# Patient Record
Sex: Female | Born: 1945 | Race: White | Hispanic: No | Marital: Married | Smoking: Former smoker
Health system: Southern US, Community
[De-identification: ages and names within clinical notes are randomized; demographics above are authoritative.]

## PROBLEM LIST (undated history)

## (undated) DIAGNOSIS — I2510 Atherosclerotic heart disease of native coronary artery without angina pectoris: Secondary | ICD-10-CM

## (undated) DIAGNOSIS — I82409 Acute embolism and thrombosis of unspecified deep veins of unspecified lower extremity: Secondary | ICD-10-CM

## (undated) DIAGNOSIS — F419 Anxiety disorder, unspecified: Secondary | ICD-10-CM

## (undated) DIAGNOSIS — M5137 Other intervertebral disc degeneration, lumbosacral region: Secondary | ICD-10-CM

## (undated) DIAGNOSIS — J189 Pneumonia, unspecified organism: Secondary | ICD-10-CM

## (undated) DIAGNOSIS — I5042 Chronic combined systolic (congestive) and diastolic (congestive) heart failure: Secondary | ICD-10-CM

## (undated) DIAGNOSIS — I639 Cerebral infarction, unspecified: Secondary | ICD-10-CM

## (undated) DIAGNOSIS — Z8719 Personal history of other diseases of the digestive system: Secondary | ICD-10-CM

## (undated) DIAGNOSIS — M1990 Unspecified osteoarthritis, unspecified site: Secondary | ICD-10-CM

## (undated) DIAGNOSIS — F329 Major depressive disorder, single episode, unspecified: Secondary | ICD-10-CM

## (undated) DIAGNOSIS — J449 Chronic obstructive pulmonary disease, unspecified: Secondary | ICD-10-CM

## (undated) DIAGNOSIS — G8929 Other chronic pain: Secondary | ICD-10-CM

## (undated) DIAGNOSIS — G4730 Sleep apnea, unspecified: Secondary | ICD-10-CM

## (undated) DIAGNOSIS — E785 Hyperlipidemia, unspecified: Secondary | ICD-10-CM

## (undated) DIAGNOSIS — M51379 Other intervertebral disc degeneration, lumbosacral region without mention of lumbar back pain or lower extremity pain: Secondary | ICD-10-CM

## (undated) DIAGNOSIS — F32A Depression, unspecified: Secondary | ICD-10-CM

## (undated) DIAGNOSIS — I739 Peripheral vascular disease, unspecified: Secondary | ICD-10-CM

## (undated) HISTORY — PX: CARPAL TUNNEL RELEASE: SHX101

## (undated) HISTORY — DX: Hyperlipidemia, unspecified: E78.5

## (undated) HISTORY — PX: TUMOR EXCISION: SHX421

## (undated) HISTORY — PX: BACK SURGERY: SHX140

## (undated) HISTORY — PX: BREAST SURGERY: SHX581

## (undated) HISTORY — DX: Cerebral infarction, unspecified: I63.9

## (undated) HISTORY — DX: Atherosclerotic heart disease of native coronary artery without angina pectoris: I25.10

## (undated) HISTORY — PX: TOTAL HIP ARTHROPLASTY: SHX124

## (undated) HISTORY — PX: FRACTURE SURGERY: SHX138

## (undated) HISTORY — PX: HERNIA REPAIR: SHX51

## (undated) HISTORY — PX: FOOT FRACTURE SURGERY: SHX645

## (undated) HISTORY — DX: Sleep apnea, unspecified: G47.30

## (undated) HISTORY — PX: FIXATION KYPHOPLASTY LUMBAR SPINE: SHX1642

## (undated) HISTORY — DX: Chronic obstructive pulmonary disease, unspecified: J44.9

## (undated) HISTORY — PX: CORONARY ANGIOPLASTY WITH STENT PLACEMENT: SHX49

## (undated) HISTORY — DX: Acute embolism and thrombosis of unspecified deep veins of unspecified lower extremity: I82.409

## (undated) HISTORY — PX: CHOLECYSTECTOMY: SHX55

## (undated) HISTORY — PX: APPENDECTOMY: SHX54

## (undated) HISTORY — PX: ABDOMINAL HYSTERECTOMY: SHX81

---

## 2009-12-18 ENCOUNTER — Ambulatory Visit: Payer: Self-pay | Admitting: Cardiology

## 2010-05-13 ENCOUNTER — Ambulatory Visit
Admission: RE | Admit: 2010-05-13 | Discharge: 2010-05-13 | Payer: Self-pay | Source: Home / Self Care | Attending: Internal Medicine | Admitting: Internal Medicine

## 2010-05-13 DIAGNOSIS — J9610 Chronic respiratory failure, unspecified whether with hypoxia or hypercapnia: Secondary | ICD-10-CM | POA: Insufficient documentation

## 2010-05-13 DIAGNOSIS — G4730 Sleep apnea, unspecified: Secondary | ICD-10-CM | POA: Insufficient documentation

## 2010-05-13 DIAGNOSIS — E785 Hyperlipidemia, unspecified: Secondary | ICD-10-CM | POA: Insufficient documentation

## 2010-05-13 DIAGNOSIS — J449 Chronic obstructive pulmonary disease, unspecified: Secondary | ICD-10-CM | POA: Insufficient documentation

## 2010-05-13 DIAGNOSIS — I6789 Other cerebrovascular disease: Secondary | ICD-10-CM | POA: Insufficient documentation

## 2010-05-13 DIAGNOSIS — F17200 Nicotine dependence, unspecified, uncomplicated: Secondary | ICD-10-CM | POA: Insufficient documentation

## 2010-05-13 DIAGNOSIS — J4489 Other specified chronic obstructive pulmonary disease: Secondary | ICD-10-CM | POA: Insufficient documentation

## 2010-05-13 DIAGNOSIS — I80299 Phlebitis and thrombophlebitis of other deep vessels of unspecified lower extremity: Secondary | ICD-10-CM | POA: Insufficient documentation

## 2010-05-13 DIAGNOSIS — I219 Acute myocardial infarction, unspecified: Secondary | ICD-10-CM | POA: Insufficient documentation

## 2010-05-22 NOTE — Assessment & Plan Note (Signed)
Summary: Pulmonary/ copd eval, hfa 50% p coaching   Copy to:  Dr. Rudi Heap Primary Provider/Referring Provider:  Dr. Rudi Heap  CC:  SOB.  History of Present Illness: 19 yowf longstanding smoking with new sob 2010.  May 13, 2010  1st pulmonary office eval cc sob x > 1 y with best days = comfortable sitting still and doe x 25 ft to get into car no longer shopping due to legs hurting from circulation. inahlers help,  02 helps, assoc with congested cough.    Pt denies any significant sore throat, dysphagia, itching, sneezing,  nasal congestion or excess secretions,  fever, chills, sweats, unintended wt loss, pleuritic or exertional cp, hempoptysis, change in activity tolerance  orthopnea pnd or leg swelling Pt also denies any obvious fluctuation in symptoms with weather or environmental change or other alleviating or aggravating factors.   not entirely sure re meds. thinks advair may have helped but didn't recognize it when I showed her a sample     Preventive Screening-Counseling & Management  Alcohol-Tobacco     Smoking Status: current     Smoking Cessation Counseling: yes  Current Medications (verified): 1)  Klonopin 0.5 Mg Tabs (Clonazepam) .... Take 1 Tablet By Mouth Three Times A Day 2)  Lipitor 20 Mg Tabs (Atorvastatin Calcium) .... Take 1 Tab By Mouth At Bedtime 3)  Coumadin 3 Mg Tabs (Warfarin Sodium) .... Tues-Sunday 4)  Coumadin 2.5 Mg Tabs (Warfarin Sodium) .... Every Monday Only 5)  Vitamin D3 50000 Unit Caps (Cholecalciferol) .... Once Weekly 6)  Folic Acid 1 Mg Tabs (Folic Acid) .... Take 1 Tablet By Mouth Once A Day 7)  Protonix 40 Mg Tbec (Pantoprazole Sodium) .... Take 1 Tablet By Mouth Once A Day 8)  Citalopram Hydrobromide 20 Mg Tabs (Citalopram Hydrobromide) .... Take 1 Tablet By Mouth Once A Day 9)  Singulair 10 Mg Tabs (Montelukast Sodium) .... Take 1 Tablet By Mouth Once A Day 10)  Bupropion Hcl 150 Mg Xr24h-Tab (Bupropion Hcl) .... Take 1 Tablet By  Mouth Once A Day 11)  Spiriva Handihaler 18 Mcg Caps (Tiotropium Bromide Monohydrate) .... One Puff Once Daily in Morning 12)  Ventolin Hfa 108 (90 Base) Mcg/act Aers (Albuterol Sulfate) .... One Puff Every 4 Hours As Needed 13)  Neurontin 300 Mg Caps (Gabapentin) .... Take 1 Tablet By Mouth Four Times A Day 14)  Furosemide 20 Mg Tabs (Furosemide) .... Take 1 Tablet By Mouth Once A Day 15)  Symbicort 160-4.5 Mcg/act Aero (Budesonide-Formoterol Fumarate) .... 2 Puffs Twice in Am and 2 Puffs in Pm 12 Hours Later 16)  Oxygen .... 2 Liters 24 Hrs/day  Allergies (verified): 1)  ! Pcn  Past History:  Past Medical History: Myocardial Infarctionx2 CHF C V A / Stroke Deep Vein Thrombosis/Phlebitis Hyperlipidemia Sleep Apnea COPD    - 02 dep chronically    - HFA 50% p coaching May 13, 2010   Past Surgical History: Appendectomy Back surgery Cholecystectomy stent placement breast surgery -benign tumors  Family History: Mother-HTN, breast cancer MGM-RA  Social History: Current smoker, started in 1969 current 1ppd. married Lives alone Smoking Status:  current  Review of Systems       The patient complains of shortness of breath with activity, shortness of breath at rest, productive cough, non-productive cough, and chest pain.  The patient denies coughing up blood, irregular heartbeats, acid heartburn, indigestion, loss of appetite, weight change, abdominal pain, difficulty swallowing, sore throat, tooth/dental problems, headaches, nasal congestion/difficulty breathing through  nose, sneezing, itching, ear ache, anxiety, depression, hand/feet swelling, joint stiffness or pain, rash, change in color of mucus, and fever.    Vital Signs:  Patient profile:   65 year old female Height:      62 inches Weight:      135 pounds BMI:     24.78 O2 Sat:      86 % on Room air Temp:     98.0 degrees F oral Pulse rate:   93 / minute BP sitting:   110 / 60  (right arm) Cuff size:    regular  Vitals Entered By: Carron Curie CMA (May 13, 2010 11:49 AM)  O2 Flow:  Room air  Physical Exam  Additional Exam:  chronically ill thin wf nad at rest ra (did not bring 02 in) wt 135 May 14, 2010 HEENT mild turbinate edema.  Oropharynx no thrush or excess pnd or cobblestoning.  No JVD or cervical adenopathy. Mild accessory muscle hypertrophy. Trachea midline, nl thryroid. Chest was hyperinflated by percussion with diminished breath sounds and marked increased exp time without wheeze. Hoover sign positive onset of inspiration. Regular rate and rhythm without murmur gallop or rub or increase P2. Decrease s1s2 Abd: no hsm, nl excursion. Ext warm without C,C or E.    Impression & Recommendations:  Problem # 1:  COPD UNSPECIFIED (ICD-496)   DDX of  difficult airways managment all start with A and  include Adherence, Ace Inhibitors, Acid Reflux, Active Sinus Disease, Alpha 1 Antitripsin deficiency, Anxiety masquerading as Airways dz,  ABPA,  allergy(esp in young), Aspiration (esp in elderly), Adverse effects of DPI,  Active smokers, plus two Bs  = Bronchiectasis and Beta blocker use..and one C= CHF   Adherence appears to be a major issue. confused with meds and how to use them.  I spent extra time with the patient today explaining optimal mdi  technique.  This improved from  25-50% with coaching  Active smoking another major concern, see # 2  Problem # 2:  SMOKER (ICD-305.1)   I emphasized that although we never turn away smokers from the pulmonary clinic, we do ask that they understand that the recommendations that were made won't work nearly as well in the presence of continued cigarette exposure and we may reach a point where we can't help the patient if he/she can't quit smoking.    Orders: New Patient Level V (09811)  Problem # 3:  RESPIRATORY FAILURE, CHRONIC (ICD-518.83)  needs to wear 02 24 hours per day at this point  Orders: New Patient Level V  (91478)  Medications Added to Medication List This Visit: 1)  Klonopin 0.5 Mg Tabs (Clonazepam) .... Take 1 tablet by mouth three times a day 2)  Lipitor 20 Mg Tabs (Atorvastatin calcium) .... Take 1 tab by mouth at bedtime 3)  Coumadin 3 Mg Tabs (Warfarin sodium) .... Tues-sunday 4)  Coumadin 2.5 Mg Tabs (Warfarin sodium) .... Every monday only 5)  Vitamin D3 50000 Unit Caps (Cholecalciferol) .... Once weekly 6)  Folic Acid 1 Mg Tabs (Folic acid) .... Take 1 tablet by mouth once a day 7)  Protonix 40 Mg Tbec (Pantoprazole sodium) .... Take 1 tablet by mouth once a day 8)  Citalopram Hydrobromide 20 Mg Tabs (Citalopram hydrobromide) .... Take 1 tablet by mouth once a day 9)  Singulair 10 Mg Tabs (Montelukast sodium) .... Take 1 tablet by mouth once a day 10)  Bupropion Hcl 150 Mg Xr24h-tab (Bupropion hcl) .... Take 1 tablet  by mouth once a day 11)  Spiriva Handihaler 18 Mcg Caps (Tiotropium bromide monohydrate) .... One puff once daily in morning 12)  Ventolin Hfa 108 (90 Base) Mcg/act Aers (Albuterol sulfate) .... One puff every 4 hours as needed 13)  Neurontin 300 Mg Caps (Gabapentin) .... Take 1 tablet by mouth four times a day 14)  Furosemide 20 Mg Tabs (Furosemide) .... Take 1 tablet by mouth once a day 15)  Symbicort 160-4.5 Mcg/act Aero (Budesonide-formoterol fumarate) .... 2 puffs twice in am and 2 puffs in pm 12 hours later 16)  Oxygen  .... 2 liters 24 hrs/day  Patient Instructions: 1)  Wear 02 24 hours per day and no smoking at all 2)  Symbicort 2 puffs first thing  in am and 2 puffs again in pm about 12 hours later  3)  Work on inhaler technique:  relax and blow all the way out then take a nice smooth deep breath back in, triggering the inhaler at same time you start breathing in 4)  Spriva first thing in am 5)  If not satisfied and you want to work harder on your breathing you'll need to make an appt with PFT's in the [place] office 6)  If you come to [place] please bring  all your medications and any lists that you have with you

## 2010-06-10 DIAGNOSIS — R072 Precordial pain: Secondary | ICD-10-CM

## 2010-06-10 DIAGNOSIS — R079 Chest pain, unspecified: Secondary | ICD-10-CM

## 2010-06-10 DIAGNOSIS — R0602 Shortness of breath: Secondary | ICD-10-CM

## 2010-06-20 ENCOUNTER — Encounter: Payer: Self-pay | Admitting: *Deleted

## 2010-07-08 ENCOUNTER — Ambulatory Visit (INDEPENDENT_AMBULATORY_CARE_PROVIDER_SITE_OTHER): Payer: Medicare Other | Admitting: Cardiology

## 2010-07-08 ENCOUNTER — Encounter: Payer: Self-pay | Admitting: Cardiology

## 2010-07-08 DIAGNOSIS — I319 Disease of pericardium, unspecified: Secondary | ICD-10-CM

## 2010-07-08 DIAGNOSIS — R0602 Shortness of breath: Secondary | ICD-10-CM

## 2010-07-08 DIAGNOSIS — I5032 Chronic diastolic (congestive) heart failure: Secondary | ICD-10-CM

## 2010-07-08 DIAGNOSIS — I2510 Atherosclerotic heart disease of native coronary artery without angina pectoris: Secondary | ICD-10-CM

## 2010-07-08 DIAGNOSIS — I80299 Phlebitis and thrombophlebitis of other deep vessels of unspecified lower extremity: Secondary | ICD-10-CM

## 2010-07-08 DIAGNOSIS — I739 Peripheral vascular disease, unspecified: Secondary | ICD-10-CM

## 2010-07-08 DIAGNOSIS — I509 Heart failure, unspecified: Secondary | ICD-10-CM

## 2010-07-08 DIAGNOSIS — I313 Pericardial effusion (noninflammatory): Secondary | ICD-10-CM

## 2010-07-08 DIAGNOSIS — I219 Acute myocardial infarction, unspecified: Secondary | ICD-10-CM

## 2010-07-08 MED ORDER — ISOSORBIDE MONONITRATE ER 30 MG PO TB24: 30.0000 mg | ORAL_TABLET | Freq: Every day | ORAL | Status: DC

## 2010-07-09 DIAGNOSIS — I313 Pericardial effusion (noninflammatory): Secondary | ICD-10-CM | POA: Insufficient documentation

## 2010-07-09 DIAGNOSIS — I739 Peripheral vascular disease, unspecified: Secondary | ICD-10-CM | POA: Insufficient documentation

## 2010-07-09 NOTE — Assessment & Plan Note (Signed)
No change in management.  Continue current medical therapy.

## 2010-07-09 NOTE — Assessment & Plan Note (Signed)
Will at isosorbide mononitrate patient's medical regimen.

## 2010-07-09 NOTE — Assessment & Plan Note (Signed)
Status-post right fem-pop bypass surgery.  No recurrent complaints.

## 2010-07-09 NOTE — Assessment & Plan Note (Signed)
Bedside echocardiogram was performed.  The patient only has a small pericardial effusion and does not require any further follow-up for this.

## 2010-07-09 NOTE — Progress Notes (Signed)
HPI The patient has been typically followed by pulmonary.  She was seen there a couple months ago.  She has COPD and is on O2. Reportedly she has a history of myocardial infarction x 2, congestive heart failure CVA and deep venous thrombosis.  The patient is a hyperlipidemia and sleep apnea. Reportedly her cardiac history includes prior stent placement. The patient is a smoker and smoked roughly a pack a day. The patient was recently hospitalized for social chest pain of more had possible inferior show dynamic T-wave changes in V3 and V6.  She underwent an echocardiogram which showed normal LV function with segmental wall motion healthy.  A subsequent Cardiolite perfusion study showed an ejection fraction of 50% with a large lateral defect consistent with prior myocardial infarction.  Given the patient's multiple comorbidities it was felt that we could treat her best medically.  She was given a prescription for indoor although discharges was never provided to her. Although the patient looks quite pale her hemoglobin is stable at 11.1.  She also has no evidence of renal insufficiency.  Her BNP level was 105 in the hospital.  She is on chronic Coumadin.  Cardiac RRR level for very mildly elevated with peak of 1.05.  The patient had a prior cardiac catheterization with prior myocardial infarction at [facility] in 2011. An echocardiogram performed during this hospitalization also showed a moderate pericardial effusion.  I repeated the bedside limited echocardiographic study which showed ejection fraction of 50% with mild right ventricular dilatation as well as lateral wall motion abnormality.  There was now only a small pericardial effusion. From a cardiac standpoint the patient seems to be doing well.  She has no recurrent chest pain.  She has chronic dyspnea secondary to her underlying lung disease and is on home O2.  She has no heart failure symptoms.  Allergies  Allergen Reactions  . Penicillins  Hives and Itching    Current Outpatient Prescriptions on File Prior to Visit  Medication Sig Dispense Refill  . atorvastatin (LIPITOR) 20 MG tablet Take 20 mg by mouth daily.        Marland Kitchen buPROPion (WELLBUTRIN XL) 150 MG 24 hr tablet Take 150 mg by mouth daily. Use as directed       . Cholecalciferol 50000 UNITS capsule Take one by mouth weekly       . citalopram (CELEXA) 20 MG tablet Take 20 mg by mouth daily.        . clonazePAM (KLONOPIN) 0.5 MG tablet Take one by mouth three times daily        . folic acid (FOLVITE) 1 MG tablet Take 1 mg by mouth daily.        . montelukast (SINGULAIR) 10 MG tablet Take 10 mg by mouth at bedtime.        Docia Barrier IN Inhale into the lungs continuous. 2 liters /[place]       . pantoprazole (PROTONIX) 40 MG tablet Take 40 mg by mouth daily.        Marland Kitchen tiotropium (SPIRIVA) 18 MCG inhalation capsule Place 18 mcg into inhaler and inhale daily.        Marland Kitchen warfarin (COUMADIN) 3 MG tablet Use as directed          Past Medical History  Diagnosis Date  . Myocardial infarction     times 2  . CHF (congestive heart failure)   . Stroke   . Deep vein thrombosis     Phlebitis  . Hyperlipidemia   .  Sleep apnea   . 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 Coronary artery disease     Past Surgical History  Procedure Date  . Appendectomy   . Back surgery   . Cholecystectomy   . Coronary stent placement   . Breast surgery     benign tumors    Family History  Problem Relation Age of Onset  . Hypertension Mother   . Breast cancer Mother   . Rheum arthritis Maternal Grandmother     History   Social History  . Marital Status: Married    Spouse Name: N/A    Number of Children: N/A  . Years of Education: N/A   Occupational History  . Not on file.   Social History Main Topics  . Smoking status: Current Everyday Smoker -- 1.0 packs/day for 40 years  . Smokeless tobacco: Not on file    Comment: started smoking in 1969  . Alcohol Use: Not on file  . Drug Use: Not on file  . Sexually Active: Not on file   Other Topics Concern  . Not on file   Social History Narrative  . No narrative on file  Review of systems: The patient denies any chest pain.  The patient has chronic dyspnea.  She denies any orthopnea PND reports no palpitations or syncope.  The remainder of the infiltrate is systems is negative.  PHYSICAL EXAM BP 109/66  Pulse 68  Ht 5\' 3"  (1.6 m)  Wt 138 lb (62.596 kg)  BMI 24.45 kg/m2  SpO2 96% General: Well-developed, well-nourished in no distressThe patient when oxygen Head: Normocephalic and atraumatic Eyes:PERRLA/EOMI intact, conjunctiva and lids normal Ears: No deformity or lesions Mouth:normal dentition, normal posterior pharynx Neck: Supple, no JVD.  No masses, thyromegaly or abnormal cervical nodes Lungs: Normal breath sounds bilaterally without wheezing.  Normal percussion Cardiac: regular rate and rhythm with normal S1 and S2, no S3 or S4.  PMI is normal.  No pathological murmurs Abdomen: Normal bowel sounds, abdomen is soft and nontender without masses, organomegaly or hernias noted.  No hepatosplenomegaly MSK: Back normal, normal gait muscle strength and tone normal Vascular: Pulse is normal in all 4 extremities Extremities: No peripheral pitting edema Neurologic: Alert and oriented x 3 Skin: Intact without lesions or rashes Lymphatics: No significant adenopathy Psychologic: Normal affect   ECG:As outlined above  ASSESSMENT AND PLAN

## 2010-07-09 NOTE — Assessment & Plan Note (Signed)
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

## 2011-01-12 ENCOUNTER — Other Ambulatory Visit: Payer: Self-pay | Admitting: *Deleted

## 2011-01-12 DIAGNOSIS — R0602 Shortness of breath: Secondary | ICD-10-CM

## 2011-01-12 DIAGNOSIS — I2510 Atherosclerotic heart disease of native coronary artery without angina pectoris: Secondary | ICD-10-CM

## 2011-01-12 MED ORDER — ISOSORBIDE MONONITRATE ER 30 MG PO TB24: 30.0000 mg | ORAL_TABLET | Freq: Every day | ORAL | Status: DC

## 2011-01-21 ENCOUNTER — Encounter (INDEPENDENT_AMBULATORY_CARE_PROVIDER_SITE_OTHER): Payer: Self-pay | Admitting: Surgery

## 2011-01-30 ENCOUNTER — Ambulatory Visit (INDEPENDENT_AMBULATORY_CARE_PROVIDER_SITE_OTHER): Payer: Medicare Other | Admitting: Surgery

## 2011-02-10 ENCOUNTER — Ambulatory Visit (INDEPENDENT_AMBULATORY_CARE_PROVIDER_SITE_OTHER): Payer: Medicare Other | Admitting: Surgery

## 2011-04-06 ENCOUNTER — Other Ambulatory Visit: Payer: Self-pay | Admitting: *Deleted

## 2011-04-06 DIAGNOSIS — R0602 Shortness of breath: Secondary | ICD-10-CM

## 2011-04-06 DIAGNOSIS — I2510 Atherosclerotic heart disease of native coronary artery without angina pectoris: Secondary | ICD-10-CM

## 2011-04-06 MED ORDER — ISOSORBIDE MONONITRATE ER 30 MG PO TB24: 30.0000 mg | ORAL_TABLET | Freq: Every day | ORAL | Status: DC

## 2011-05-07 DIAGNOSIS — Z7901 Long term (current) use of anticoagulants: Secondary | ICD-10-CM

## 2011-07-08 ENCOUNTER — Inpatient Hospital Stay (HOSPITAL_COMMUNITY)
Admission: EM | Admit: 2011-07-08 | Discharge: 2011-07-14 | DRG: 190 | Disposition: A | Discharge status: Home / Self Care | Payer: Medicare Other | Attending: Internal Medicine | Admitting: Internal Medicine

## 2011-07-08 ENCOUNTER — Other Ambulatory Visit: Payer: Self-pay

## 2011-07-08 ENCOUNTER — Encounter (HOSPITAL_COMMUNITY): Payer: Self-pay | Admitting: *Deleted

## 2011-07-08 ENCOUNTER — Emergency Department (HOSPITAL_COMMUNITY): Payer: Medicare Other

## 2011-07-08 DIAGNOSIS — Z803 Family history of malignant neoplasm of breast: Secondary | ICD-10-CM

## 2011-07-08 DIAGNOSIS — G4730 Sleep apnea, unspecified: Secondary | ICD-10-CM | POA: Diagnosis present

## 2011-07-08 DIAGNOSIS — I80299 Phlebitis and thrombophlebitis of other deep vessels of unspecified lower extremity: Secondary | ICD-10-CM

## 2011-07-08 DIAGNOSIS — I2510 Atherosclerotic heart disease of native coronary artery without angina pectoris: Secondary | ICD-10-CM | POA: Diagnosis present

## 2011-07-08 DIAGNOSIS — Z79899 Other long term (current) drug therapy: Secondary | ICD-10-CM

## 2011-07-08 DIAGNOSIS — E785 Hyperlipidemia, unspecified: Secondary | ICD-10-CM | POA: Diagnosis present

## 2011-07-08 DIAGNOSIS — J441 Chronic obstructive pulmonary disease with (acute) exacerbation: Principal | ICD-10-CM | POA: Diagnosis present

## 2011-07-08 DIAGNOSIS — I739 Peripheral vascular disease, unspecified: Secondary | ICD-10-CM

## 2011-07-08 DIAGNOSIS — I509 Heart failure, unspecified: Secondary | ICD-10-CM | POA: Diagnosis present

## 2011-07-08 DIAGNOSIS — J9610 Chronic respiratory failure, unspecified whether with hypoxia or hypercapnia: Secondary | ICD-10-CM

## 2011-07-08 DIAGNOSIS — J9620 Acute and chronic respiratory failure, unspecified whether with hypoxia or hypercapnia: Secondary | ICD-10-CM | POA: Diagnosis present

## 2011-07-08 DIAGNOSIS — I319 Disease of pericardium, unspecified: Secondary | ICD-10-CM | POA: Diagnosis present

## 2011-07-08 DIAGNOSIS — Z7901 Long term (current) use of anticoagulants: Secondary | ICD-10-CM

## 2011-07-08 DIAGNOSIS — I313 Pericardial effusion (noninflammatory): Secondary | ICD-10-CM

## 2011-07-08 DIAGNOSIS — Z8673 Personal history of transient ischemic attack (TIA), and cerebral infarction without residual deficits: Secondary | ICD-10-CM

## 2011-07-08 DIAGNOSIS — IMO0002 Reserved for concepts with insufficient information to code with codable children: Secondary | ICD-10-CM

## 2011-07-08 DIAGNOSIS — J449 Chronic obstructive pulmonary disease, unspecified: Secondary | ICD-10-CM

## 2011-07-08 DIAGNOSIS — F17200 Nicotine dependence, unspecified, uncomplicated: Secondary | ICD-10-CM | POA: Diagnosis present

## 2011-07-08 DIAGNOSIS — F411 Generalized anxiety disorder: Secondary | ICD-10-CM | POA: Diagnosis present

## 2011-07-08 DIAGNOSIS — Z880 Allergy status to penicillin: Secondary | ICD-10-CM

## 2011-07-08 DIAGNOSIS — I219 Acute myocardial infarction, unspecified: Secondary | ICD-10-CM

## 2011-07-08 DIAGNOSIS — I6789 Other cerebrovascular disease: Secondary | ICD-10-CM

## 2011-07-08 DIAGNOSIS — I252 Old myocardial infarction: Secondary | ICD-10-CM

## 2011-07-08 DIAGNOSIS — Z8249 Family history of ischemic heart disease and other diseases of the circulatory system: Secondary | ICD-10-CM

## 2011-07-08 DIAGNOSIS — Z8261 Family history of arthritis: Secondary | ICD-10-CM

## 2011-07-08 DIAGNOSIS — Z86718 Personal history of other venous thrombosis and embolism: Secondary | ICD-10-CM

## 2011-07-08 DIAGNOSIS — I5032 Chronic diastolic (congestive) heart failure: Secondary | ICD-10-CM

## 2011-07-08 LAB — CBC
HCT: 36.5 % (ref 36.0–46.0)
MCH: 30.3 pg (ref 26.0–34.0)
MCHC: 33.2 g/dL (ref 30.0–36.0)
MCV: 91.5 fL (ref 78.0–100.0)
RDW: 13.6 % (ref 11.5–15.5)

## 2011-07-08 LAB — BASIC METABOLIC PANEL
BUN: 8 mg/dL (ref 6–23)
Creatinine, Ser: 0.76 mg/dL (ref 0.50–1.10)
GFR calc Af Amer: >90 mL/min (ref >90)
GFR calc non Af Amer: 86 mL/min — ABNORMAL LOW (ref >90)

## 2011-07-08 MED ORDER — GABAPENTIN 300 MG PO CAPS
300.0000 mg | ORAL_CAPSULE | Freq: Four times a day (QID) | ORAL | Status: DC
  Administered 2011-07-09 – 2011-07-14 (×21): 300 mg via ORAL
  Filled 2011-07-08 (×24): qty 1

## 2011-07-08 MED ORDER — SODIUM CHLORIDE 0.9 % IV SOLN
250.0000 mL | INTRAVENOUS | Status: DC | PRN
  Administered 2011-07-09: 250 mL via INTRAVENOUS

## 2011-07-08 MED ORDER — METHYLPREDNISOLONE SODIUM SUCC 125 MG IJ SOLR
125.0000 mg | Freq: Once | INTRAMUSCULAR | Status: AC
  Administered 2011-07-08: 125 mg via INTRAVENOUS
  Filled 2011-07-08: qty 2

## 2011-07-08 MED ORDER — SODIUM CHLORIDE 0.9 % IJ SOLN
3.0000 mL | Freq: Two times a day (BID) | INTRAMUSCULAR | Status: DC
  Administered 2011-07-09 – 2011-07-13 (×7): 3 mL via INTRAVENOUS

## 2011-07-08 MED ORDER — FLUTICASONE-SALMETEROL 250-50 MCG/DOSE IN AEPB
1.0000 | INHALATION_SPRAY | Freq: Two times a day (BID) | RESPIRATORY_TRACT | Status: DC
  Administered 2011-07-09 – 2011-07-14 (×8): 1 via RESPIRATORY_TRACT
  Filled 2011-07-08 (×3): qty 14

## 2011-07-08 MED ORDER — CLONAZEPAM 1 MG PO TABS
1.0000 mg | ORAL_TABLET | Freq: Three times a day (TID) | ORAL | Status: DC
  Administered 2011-07-09 – 2011-07-11 (×8): 1 mg via ORAL
  Filled 2011-07-08 (×9): qty 1

## 2011-07-08 MED ORDER — DEXTROSE 5 % IV SOLN
500.0000 mg | Freq: Every day | INTRAVENOUS | Status: DC
  Administered 2011-07-09 – 2011-07-12 (×4): 500 mg via INTRAVENOUS
  Filled 2011-07-08 (×6): qty 500

## 2011-07-08 MED ORDER — CITALOPRAM HYDROBROMIDE 20 MG PO TABS
20.0000 mg | ORAL_TABLET | Freq: Every day | ORAL | Status: DC
  Administered 2011-07-09 – 2011-07-14 (×6): 20 mg via ORAL
  Filled 2011-07-08 (×6): qty 1

## 2011-07-08 MED ORDER — TIOTROPIUM BROMIDE MONOHYDRATE 18 MCG IN CAPS
18.0000 ug | ORAL_CAPSULE | Freq: Every day | RESPIRATORY_TRACT | Status: DC
  Administered 2011-07-11 – 2011-07-14 (×4): 18 ug via RESPIRATORY_TRACT
  Filled 2011-07-08 (×2): qty 5

## 2011-07-08 MED ORDER — SODIUM CHLORIDE 0.9 % IJ SOLN: 3.0000 mL | Freq: Two times a day (BID) | INTRAMUSCULAR | Status: DC

## 2011-07-08 MED ORDER — LEVALBUTEROL HCL 0.63 MG/3ML IN NEBU
0.6300 mg | INHALATION_SOLUTION | Freq: Four times a day (QID) | RESPIRATORY_TRACT | Status: DC | PRN
  Filled 2011-07-08: qty 3

## 2011-07-08 MED ORDER — ISOSORBIDE MONONITRATE ER 30 MG PO TB24
30.0000 mg | ORAL_TABLET | Freq: Every day | ORAL | Status: DC
  Administered 2011-07-10 – 2011-07-14 (×5): 30 mg via ORAL
  Filled 2011-07-08 (×6): qty 1

## 2011-07-08 MED ORDER — METOPROLOL TARTRATE 12.5 MG HALF TABLET
12.5000 mg | ORAL_TABLET | Freq: Two times a day (BID) | ORAL | Status: DC
  Administered 2011-07-09 – 2011-07-14 (×9): 12.5 mg via ORAL
  Filled 2011-07-08 (×12): qty 1

## 2011-07-08 MED ORDER — SODIUM CHLORIDE 0.9 % IV SOLN: INTRAVENOUS | Status: AC

## 2011-07-08 MED ORDER — IPRATROPIUM BROMIDE 0.02 % IN SOLN
0.5000 mg | Freq: Once | RESPIRATORY_TRACT | Status: AC
Start: 2011-07-08 — End: 2011-07-08
  Administered 2011-07-08: 0.5 mg via RESPIRATORY_TRACT
  Filled 2011-07-08: qty 2.5

## 2011-07-08 MED ORDER — HYDROMORPHONE HCL PF 1 MG/ML IJ SOLN: 0.5000 mg | INTRAMUSCULAR | Status: AC | PRN

## 2011-07-08 MED ORDER — PANTOPRAZOLE SODIUM 40 MG PO TBEC
40.0000 mg | DELAYED_RELEASE_TABLET | Freq: Every day | ORAL | Status: DC
  Administered 2011-07-09 – 2011-07-14 (×6): 40 mg via ORAL
  Filled 2011-07-08 (×5): qty 1

## 2011-07-08 MED ORDER — SIMVASTATIN 20 MG PO TABS
20.0000 mg | ORAL_TABLET | Freq: Every day | ORAL | Status: DC
  Administered 2011-07-09 – 2011-07-13 (×5): 20 mg via ORAL
  Filled 2011-07-08 (×6): qty 1

## 2011-07-08 MED ORDER — OXYCODONE-ACETAMINOPHEN 10-325 MG PO TABS: 1.0000 | ORAL_TABLET | Freq: Three times a day (TID) | ORAL | Status: DC | PRN

## 2011-07-08 MED ORDER — ALBUTEROL (5 MG/ML) CONTINUOUS INHALATION SOLN
10.0000 mg/h | INHALATION_SOLUTION | RESPIRATORY_TRACT | Status: AC
  Administered 2011-07-08: 10 mg/h via RESPIRATORY_TRACT
  Filled 2011-07-08: qty 20

## 2011-07-08 MED ORDER — BUPROPION HCL ER (XL) 150 MG PO TB24
150.0000 mg | ORAL_TABLET | Freq: Every day | ORAL | Status: DC
  Administered 2011-07-09 – 2011-07-14 (×6): 150 mg via ORAL
  Filled 2011-07-08 (×6): qty 1

## 2011-07-08 MED ORDER — FENOFIBRATE 160 MG PO TABS
160.0000 mg | ORAL_TABLET | Freq: Every day | ORAL | Status: DC
  Administered 2011-07-09 – 2011-07-14 (×6): 160 mg via ORAL
  Filled 2011-07-08 (×6): qty 1

## 2011-07-08 MED ORDER — LEVALBUTEROL HCL 0.63 MG/3ML IN NEBU
0.6300 mg | INHALATION_SOLUTION | Freq: Four times a day (QID) | RESPIRATORY_TRACT | Status: DC
  Administered 2011-07-09 – 2011-07-13 (×15): 0.63 mg via RESPIRATORY_TRACT
  Filled 2011-07-08 (×23): qty 3

## 2011-07-08 MED ORDER — SODIUM CHLORIDE 0.9 % IJ SOLN: 3.0000 mL | INTRAMUSCULAR | Status: DC | PRN

## 2011-07-08 MED ORDER — LORAZEPAM 1 MG PO TABS
1.0000 mg | ORAL_TABLET | Freq: Once | ORAL | Status: AC
Start: 2011-07-08 — End: 2011-07-08
  Administered 2011-07-08: 1 mg via ORAL
  Filled 2011-07-08: qty 1

## 2011-07-08 MED ORDER — MONTELUKAST SODIUM 10 MG PO TABS
10.0000 mg | ORAL_TABLET | Freq: Every day | ORAL | Status: DC
  Administered 2011-07-09 – 2011-07-13 (×5): 10 mg via ORAL
  Filled 2011-07-08 (×6): qty 1

## 2011-07-08 MED ORDER — ALBUTEROL SULFATE (5 MG/ML) 0.5% IN NEBU
5.0000 mg | INHALATION_SOLUTION | Freq: Once | RESPIRATORY_TRACT | Status: AC
Start: 2011-07-08 — End: 2011-07-08
  Administered 2011-07-08: 5 mg via RESPIRATORY_TRACT
  Filled 2011-07-08: qty 1

## 2011-07-08 MED ORDER — LISINOPRIL 5 MG PO TABS
5.0000 mg | ORAL_TABLET | Freq: Every day | ORAL | Status: DC
  Administered 2011-07-10 – 2011-07-14 (×5): 5 mg via ORAL
  Filled 2011-07-08 (×6): qty 1

## 2011-07-08 MED ORDER — FOLIC ACID 1 MG PO TABS
1.0000 mg | ORAL_TABLET | Freq: Every day | ORAL | Status: DC
  Administered 2011-07-09 – 2011-07-14 (×6): 1 mg via ORAL
  Filled 2011-07-08 (×6): qty 1

## 2011-07-08 MED ORDER — AZITHROMYCIN 500 MG IV SOLR
500.0000 mg | Freq: Once | INTRAVENOUS | Status: AC
  Administered 2011-07-08: 500 mg via INTRAVENOUS
  Filled 2011-07-08: qty 500

## 2011-07-08 NOTE — Progress Notes (Signed)
PHARMACY - ANTICOAGULATION CONSULT INITIAL NOTE  Pharmacy Consult for: Coumadin  Indication: H/o CVA    Patient Data:   Allergies: Allergies  Allergen Reactions  . Aspirin   . Penicillins Hives and Itching    Patient Measurements:    Vital Signs: Temp:  [99.4 F (37.4 C)-100.1 F (37.8 C)] 99.4 F (37.4 C) (03/20 1817) Pulse Rate:  [87-128] 124  (03/20 2200) Resp:  [18-32] 21  (03/20 2200) BP: (95-127)/(57-93) 116/57 mmHg (03/20 2200) SpO2:  [90 %-95 %] 93 % (03/20 2200) FiO2 (%):  [40 %] 40 % (03/20 2134)  Intake/Output from previous day: No intake or output data in the 24 hours ending 07/08/11 2359  Labs:  Basename 07/08/11 1732 07/08/11 1503 07/08/11 1502  HGB -- -- 12.1  HCT -- -- 36.5  PLT -- -- 223  APTT -- -- --  LABPROT 36.5* -- --  INR 3.61* -- --  HEPARINUNFRC -- -- --  CREATININE -- -- 0.76  CKTOTAL -- -- --  CKMB -- -- --  TROPONINI -- <0.30 --   The CrCl is unknown because both a height and weight (above a minimum accepted value) are required for this calculation.  Medical History: Past Medical History  Diagnosis Date  . Myocardial infarction     times 2  . CHF (congestive heart failure)   . Stroke   . Deep vein thrombosis     Phlebitis  . Hyperlipidemia   . Sleep apnea   . 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 Coronary artery disease     Scheduled medications:     . sodium chloride   Intravenous STAT  . albuterol  5 mg Nebulization Once  . azithromycin  500 mg Intravenous Once  . azithromycin  500 mg Intravenous Q24H  . buPROPion  150 mg Oral Daily  . citalopram  20 mg Oral Daily  . clonazePAM  1 mg Oral TID  . fenofibrate  160 mg Oral Daily  . Fluticasone-Salmeterol  1 puff Inhalation Q12H  . folic acid  1 mg Oral Daily  . gabapentin  300 mg Oral QID  . ipratropium  0.5 mg Nebulization Once  . isosorbide mononitrate  30 mg Oral Daily  . levalbuterol   0.63 mg Nebulization Q6H  . lisinopril  5 mg Oral Daily  . LORazepam  1 mg Oral Once  . methylPREDNISolone (SOLU-MEDROL) injection  125 mg Intravenous Once  . metoprolol tartrate  12.5 mg Oral BID  . montelukast  10 mg Oral QHS  . pantoprazole  40 mg Oral Daily  . simvastatin  20 mg Oral Daily  . sodium chloride  3 mL Intravenous Q12H  . sodium chloride  3 mL Intravenous Q12H  . tiotropium  18 mcg Inhalation Daily     Assessment:  66 y.o. female admitted on 07/08/2011, with shortness of breath. Pharmacy consulted to manage Coumadin. Home Coumadin regimen of 3mg  daily, except 1.5mg  on Monday and Fridays for h/o CVA. Admit INR (3.61) is above-goal.  Goal of Therapy:  1. INR 2-3  Plan:  1. No Coumadin for today.  2. Daily PT / INR.   Dineen Kid Thad Ranger, PharmD 07/08/2011, 11:59 PM

## 2011-07-08 NOTE — ED Provider Notes (Signed)
8:45 PM 66 yo woman who has had shortness of breath for several days.  She has a prior Hx of COPD, is on home O2, still smokes.  She has had IV solumedrol and has had continuous albuteral nebulizer treatments, but still is wheezing and desaturates even while at rest breathing oxygen.  She will need hospitalization for her COPD exacerbation.   Carleene Cooper III, MD 07/08/11 [phone]

## 2011-07-08 NOTE — ED Notes (Signed)
Pt sts "Can I take my home meds?" Pt specifically asking to take her home dose of Klonopin. Santina Evans, [place] aware of pts needs

## 2011-07-08 NOTE — ED Provider Notes (Signed)
  Physical Exam  BP 95/75  Pulse 97  Temp(Src) 99.4 F (37.4 C) (Oral)  Resp 20  SpO2 93%  Physical Exam  Abdominal: She exhibits distension.    ED Course  Procedures  4:00 PM Signout from [place], [place]. Pt presenting with dyspnea. Hx CHF, COPD. URI like illness x 5 days, worsening sx overnight today. O2 sat at PCP's office today on 3L home O2 was 90%.   Lab workup essentially nl. Awaiting INR to ensure this is therapeutic. Pt was txed with 1 alb/atro neb en route, and additional has been ordered as well as solumedrol.   6:42 PM INR supratherapeutic at 3.61. Pt was ambulated s/p second neb and had sat of ~90% on 5-6L. Will give additional continuous neb.  8:46 PM Pt s/p 2 albuterol/atrovent nebs, 1 continuous neb, solumedrol. Continues to have desats to about 90% while lying in bed. D/W Dr. Ignacia Palma who saw pt with me - given these factors, will admit for COPD exacerbation. Pt agreeable to this.  9:01 PM Talked to Dr. Conley Rolls with [facility] - agrees to admit. Requests IV Zithromax, which I've ordered.  Medical screening examination/treatment/procedure(s) were conducted as a shared visit with non-physician practitioner(s) and myself.  I personally evaluated the patient during the encounter Osvaldo Human, MD Physician Signed Emergency Medicine ED Provider Notes 07/08/2011 8:45 PM   8:45 PM  67 yo woman who has had shortness of breath for several days. She has a prior Hx of COPD, is on home O2, still smokes. She has had IV solumedrol and has had continuous albuteral nebulizer treatments, but still is wheezing and desaturates even while at rest breathing oxygen. She will need hospitalization for her COPD exacerbation.  Carleene Cooper III, MD  07/08/11 2048    Grant Fontana, PA 07/08/11 2103  Carleene Cooper III, MD 07/10/11 1336

## 2011-07-08 NOTE — ED Notes (Signed)
Admitting MD at bedside, pt awaiting inpt beds assignment.  

## 2011-07-08 NOTE — ED Notes (Signed)
Ambulated pt with pulse oximetry.  O2 stayed consistently at 89% to 90% on 6L O2

## 2011-07-08 NOTE — ED Provider Notes (Signed)
History     CSN: [id number]  Arrival date & time 07/08/11  1333   First MD Initiated Contact with Sanders 07/08/11 1355      Chief Complaint  Sanders presents with  . Shortness of Breath  . Shoulder Pain    (Consider location/radiation/quality/duration/timing/severity/associated sxs/prior treatment) Sanders is a 66 y.o. female presenting with shortness of breath. Aimee history is provided by Aimee Sanders.  Shortness of Breath  Associated symptoms include a fever, rhinorrhea, sore throat, cough, shortness of breath and wheezing.  pt with PMH including CHF, COPD, CAD, MI presents to ED with cc of SOB acutely worse since last night. Has had mild illness for Aimee last 4-5 days with fever, chills, nasal/sinus congestion, sore throat, non-productive cough, and myalgias. Visited her PMD today with her worsening symptoms overnight and was found to have O2 saturation of 90% on her chronic level of O2 supplementation. She reports there has been some associated chest pain intermittently since yesterday, described as "a lightning bolt, then another not-so-sharp bolt" that radiates from Aimee right chest to Aimee left chest and lasts only a few seconds at a time. It does not feel like her anginal equivalent pain. Exertion makes her shortness of breath worse. Rest and breathing treatments have made it better. She received a neb tx by EMS pta with good subjective symptom improvement.  Past Medical History  Diagnosis Date  . Myocardial infarction     times 2  . CHF (congestive heart failure)   . Stroke   . Deep vein thrombosis     Phlebitis  . Hyperlipidemia   . Sleep apnea   . 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 Coronary artery disease     Past Surgical History  Procedure Date  . Appendectomy   . Back surgery   . Cholecystectomy   . Coronary stent placement   . Breast surgery     benign tumors    Family History  Problem  Relation Age of Onset  . Hypertension Mother   . Breast cancer Mother   . Rheum arthritis Maternal Grandmother     History  Substance Use Topics  . Smoking status: Current Everyday Smoker -- 1.0 packs/day for 40 years    Types: Cigarettes  . Smokeless tobacco: Not on file   Comment: started smoking in 1969  . Alcohol Use: No     Review of Systems  Constitutional: Positive for fever.  HENT: Positive for sore throat and rhinorrhea.   Respiratory: Positive for cough, shortness of breath and wheezing.   10 systems reviewed and are otherwise negative for acute change except as noted in Aimee HPI.   Allergies  Aspirin and Penicillins  Home Medications   Current Outpatient Rx  Name Route Sig Dispense Refill  . ALBUTEROL SULFATE (2.5 MG/3ML) 0.083% IN NEBU Nebulization Take 2.5 mg by nebulization every 6 (six) hours as needed. For wheezing/shortness of breath    . ATORVASTATIN CALCIUM 20 MG PO TABS Oral Take 20 mg by mouth at bedtime.     . BUPROPION HCL ER (XL) 150 MG PO TB24 Oral Take 150 mg by mouth daily. Use as directed     . CITALOPRAM HYDROBROMIDE 20 MG PO TABS Oral Take 20 mg by mouth daily.     Marland Kitchen CLONAZEPAM 1 MG PO TABS Oral Take 1 mg by mouth 3 (three) times daily.    . FENOFIBRATE 160 MG  PO TABS Oral Take 160 mg by mouth daily.     Marland Kitchen FLUTICASONE-SALMETEROL 250-50 MCG/DOSE IN AEPB Inhalation Inhale 1 puff into Aimee lungs every 12 (twelve) hours.     Marland Kitchen FOLIC ACID 1 MG PO TABS Oral Take 1 mg by mouth daily.     Marland Kitchen GABAPENTIN 300 MG PO CAPS Oral Take 300 mg by mouth 4 (four) times daily.    . ISOSORBIDE MONONITRATE ER 30 MG PO TB24 Oral Take 30 mg by mouth daily.    Marland Kitchen LISINOPRIL 5 MG PO TABS Oral Take 5 mg by mouth daily.     Marland Kitchen METOPROLOL TARTRATE 25 MG PO TABS Oral Take 12.5 mg by mouth 2 (two) times daily.    Marland Kitchen MONTELUKAST SODIUM 10 MG PO TABS Oral Take 10 mg by mouth at bedtime.     Marland Kitchen NITROGLYCERIN 0.4 MG SL SUBL Sublingual Place 0.4 mg under Aimee tongue every 5 (five) minutes  as needed. For chest pain    . OXYCODONE-ACETAMINOPHEN 10-325 MG PO TABS Oral Take 1 tablet by mouth every 8 (eight) hours as needed. For pain    . OXYGEN-HELIUM IN Inhalation Inhale into Aimee lungs continuous. 2 liters /[place]     . PANTOPRAZOLE SODIUM 40 MG PO TBEC Oral Take 40 mg by mouth daily.     Marland Kitchen TIOTROPIUM BROMIDE MONOHYDRATE 18 MCG IN CAPS Inhalation Place 18 mcg into inhaler and inhale daily.     . WARFARIN SODIUM 3 MG PO TABS Oral Take 1.5-3 mg by mouth daily. Takes 1.5mg  on Monday and Friday and 3 mg on all other days      BP 102/64  Pulse 88  Temp(Src) 99.9 F (37.7 C) (Oral)  Resp 24  SpO2 95%  Physical Exam  Nursing note and vitals reviewed. Constitutional: She is oriented to person, place, and time. She appears well-developed and well-nourished. No distress.  HENT:  Head: Normocephalic and atraumatic.  Right Ear: External ear normal.  Left Ear: External ear normal.       Bilateral TM with slight injection. Mild oropharyngeal erythema without edema or exudate.  Eyes: Conjunctivae are normal. Pupils are equal, round, and reactive to light.  Neck: Normal range of motion. Neck supple.  Cardiovascular: Normal rate, regular rhythm, normal heart sounds and intact distal pulses.   Pulmonary/Chest:       Increased WOB with wheezes in all lung fields. Mild upper chest wall tenderness.  Abdominal: Soft. Bowel sounds are normal. She exhibits no distension. There is no tenderness.  Musculoskeletal: She exhibits no edema and no tenderness.  Lymphadenopathy:    She has no cervical adenopathy.  Neurological: She is alert and oriented to person, place, and time. No cranial nerve deficit.  Skin: Skin is warm and dry.  Psychiatric: She has a normal mood and affect.    ED Course  Procedures (including critical care time)  Labs Reviewed  BASIC METABOLIC PANEL - Abnormal; Notable for Aimee following:    Potassium 3.3 (*)    Chloride 95 (*)    CO2 35 (*)    GFR calc non Af Amer 86 (*)     All other components within normal limits  CBC  TROPONIN I  PROTIME-INR   Dg Chest 2 View  07/08/2011  *RADIOLOGY REPORT*  Clinical Data: Cough and wheezing.  Left-sided chest pain.  CHEST - 2 VIEW  Comparison: 05/06/2011 and CT chest 06/13/2010.  Findings: Trachea is midline.  Heart size normal.  Lungs are hyperinflated.  Scarring  at Aimee left costophrenic angle.  Lungs are otherwise clear.  No pleural fluid.  Osteopenia with multiple vertebroplasties.  Lower thoracic or upper lumbar compression fracture is stable.  IMPRESSION: COPD without acute finding.  Original Report Authenticated By: Reyes Ivan, M.D.     Date: 07/08/2011  Rate: 92  Rhythm: sinus  QRS Axis: left  Intervals: normal  ST/T Wave abnormalities: flattened t-waves anteriorly  Conduction Disutrbances:none  Narrative Interpretation:   Old EKG Reviewed: none available     MDM  Pt with baseline respiratory disease, symptoms c/w infection vs COPD exacerbation. Doubt ACS given clinical picture, though a troponin is ordered. PA C Mayford Knife has been given report on this Sanders and will continue to follow.        Shaaron Adler, [place] 07/08/11 1637

## 2011-07-08 NOTE — ED Notes (Signed)
Pt sitting up in bed with mild distress upon observation. Pt receiving continuous neb treatment, pt is tachypnea and tachycardic at this time. Pt has expiratory wheezing upon auscultation, plan of care is updated with verbal understanding. Will continue to monitor pt, pt is awaiting MD re-evaluation for disposition.

## 2011-07-08 NOTE — ED Notes (Signed)
Per EMS pt from MD's office [facility] with c/o shortness of breath. Pt on home O2- sats 90% on oxygen. Pain to left shoulder- no known injury. Denies chest pain. Hx of MI. Pt reports cough- nonproductive. Pt given neb treatment- albuterol/atrovent. Wheezing in all lung fields. BP/HR stable.  IV 20G Left arm by MD's office.

## 2011-07-09 ENCOUNTER — Other Ambulatory Visit: Payer: Self-pay

## 2011-07-09 DIAGNOSIS — R079 Chest pain, unspecified: Secondary | ICD-10-CM

## 2011-07-09 LAB — PROTIME-INR: Prothrombin Time: 32.2 seconds — ABNORMAL HIGH (ref 11.6–15.2)

## 2011-07-09 LAB — CARDIAC PANEL(CRET KIN+CKTOT+MB+TROPI)
CK, MB: 1.9 ng/mL (ref 0.3–4.0)
CK, MB: 4 ng/mL (ref 0.3–4.0)
Relative Index: 3.1 — ABNORMAL HIGH (ref 0.0–2.5)
Relative Index: 3.1 — ABNORMAL HIGH (ref 0.0–2.5)
Total CK: 112 U/L (ref 7–177)
Total CK: 131 U/L (ref 7–177)
Total CK: 57 U/L (ref 7–177)
Troponin I: <0.3 ng/mL (ref <0.30)

## 2011-07-09 LAB — CBC
HCT: 33.6 % — ABNORMAL LOW (ref 36.0–46.0)
MCHC: 33 g/dL (ref 30.0–36.0)
RDW: 13.8 % (ref 11.5–15.5)
WBC: 4.5 10*3/uL (ref 4.0–10.5)

## 2011-07-09 LAB — BASIC METABOLIC PANEL
BUN: 9 mg/dL (ref 6–23)
Chloride: 97 mEq/L (ref 96–112)
Creatinine, Ser: 0.64 mg/dL (ref 0.50–1.10)
GFR calc Af Amer: >90 mL/min (ref >90)
GFR calc non Af Amer: >90 mL/min (ref >90)
Potassium: 2.5 mEq/L — CL (ref 3.5–5.1)

## 2011-07-09 LAB — POTASSIUM: Potassium: 4.1 mEq/L (ref 3.5–5.1)

## 2011-07-09 LAB — TSH: TSH: 0.493 u[IU]/mL (ref 0.350–4.500)

## 2011-07-09 MED ORDER — WARFARIN - PHARMACIST DOSING INPATIENT
Freq: Every day | Status: DC
  Administered 2011-07-09 – 2011-07-12 (×3)

## 2011-07-09 MED ORDER — OXYCODONE-ACETAMINOPHEN 5-325 MG PO TABS
1.0000 | ORAL_TABLET | Freq: Three times a day (TID) | ORAL | Status: DC | PRN
  Administered 2011-07-09 – 2011-07-14 (×14): 1 via ORAL
  Filled 2011-07-09 (×15): qty 1

## 2011-07-09 MED ORDER — METHYLPREDNISOLONE SODIUM SUCC 125 MG IJ SOLR
60.0000 mg | INTRAMUSCULAR | Status: DC
  Administered 2011-07-09 – 2011-07-11 (×16): 60 mg via INTRAVENOUS
  Filled 2011-07-09 (×5): qty 0.96
  Filled 2011-07-09: qty 2
  Filled 2011-07-09: qty 0.96
  Filled 2011-07-09: qty 2
  Filled 2011-07-09 (×7): qty 0.96
  Filled 2011-07-09: qty 2
  Filled 2011-07-09: qty 8
  Filled 2011-07-09: qty 0.96

## 2011-07-09 MED ORDER — NITROGLYCERIN 0.4 MG SL SUBL
SUBLINGUAL_TABLET | SUBLINGUAL | Status: AC
  Administered 2011-07-09: 09:00:00
  Filled 2011-07-09: qty 25

## 2011-07-09 MED ORDER — OXYCODONE HCL 5 MG PO TABS
5.0000 mg | ORAL_TABLET | Freq: Three times a day (TID) | ORAL | Status: DC | PRN
  Administered 2011-07-09 – 2011-07-14 (×13): 5 mg via ORAL
  Filled 2011-07-09 (×14): qty 1

## 2011-07-09 MED ORDER — POTASSIUM CHLORIDE CRYS ER 20 MEQ PO TBCR
40.0000 meq | EXTENDED_RELEASE_TABLET | ORAL | Status: AC
  Administered 2011-07-09 (×2): 40 meq via ORAL
  Filled 2011-07-09 (×2): qty 2

## 2011-07-09 MED ORDER — POTASSIUM CHLORIDE 10 MEQ/100ML IV SOLN
10.0000 meq | INTRAVENOUS | Status: DC
  Administered 2011-07-09: 10 meq via INTRAVENOUS
  Filled 2011-07-09 (×2): qty 100

## 2011-07-09 MED ORDER — WARFARIN SODIUM 1 MG PO TABS
1.5000 mg | ORAL_TABLET | Freq: Once | ORAL | Status: AC
  Administered 2011-07-09: 1.5 mg via ORAL
  Filled 2011-07-09: qty 1

## 2011-07-09 MED ORDER — POTASSIUM CHLORIDE CRYS ER 20 MEQ PO TBCR
40.0000 meq | EXTENDED_RELEASE_TABLET | Freq: Two times a day (BID) | ORAL | Status: DC
  Administered 2011-07-09 – 2011-07-10 (×3): 40 meq via ORAL
  Filled 2011-07-09 (×2): qty 2
  Filled 2011-07-09: qty 1
  Filled 2011-07-09: qty 2

## 2011-07-09 NOTE — Consult Note (Signed)
Patient ID: Aimee Sanders MRN: [id number] DOB/AGE: January 24, 1946 66 y.o.  Admit date: 07/08/2011 Referring Physician: Marthann Schiller Primary Cardiologist: Lewayne Bunting Reason for Consultation: Chest pain  HPI: 66 yo WF with history of ongoing tobacco abuse, oxygen dependent COPD, OSA, HLD, DVT on chronic anti-coagulation, CVA, CHF, CAD with report of prior cath and coronary stent placement who is admitted to the Hospitalist service early this am with c/o progressive SOB over last 5 days. She has been treated with steroids and nebulizers for what is felt to be a COPD exacerbation. She had c/o severe chest pain this am. Her first set of cardiac enzymes were negative in the ED this am. The next set of cardiac enzymes is pending. She has no chest pain at this time. EKG did not show acute changes. She has been seen in our [facility] cardiology office  by Dr Andee Lineman (last visit March 2012). Per report, she was hospitalized for chest pain in 2012 and her EKG showed dynamic T-wave changes in V3 and V6. She underwent an echocardiogram which showed normal LV function with segmental wall motion abnormality. A subsequent Cardiolite perfusion study showed an ejection fraction of 50% with a large lateral defect consistent with prior myocardial infarction. Given the patient's multiple comorbidities it was felt that she would be best treated medically. She tells me today that her pain this am was substernal, no radiation, described as a "gas-like" pain. It did not make her breathing more difficult. No associated diaphoresis, N/V. Lasted for about 15 minutes. Resolved with SL NTG. She has been hypotensive today and her Imdur, Lisinopril and Lopressor have been held. She tells me that she had a cath at [facility] in 2010 and had stents placed. Reports overall improvement in breathing since beginning treatment for her COPD exacerbation.    Past Medical History  Diagnosis Date  . Myocardial infarction     times 2  .  CHF (congestive heart failure)   . Stroke   . Deep vein thrombosis     Phlebitis  . Hyperlipidemia   . Sleep apnea   . 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 Coronary artery disease     Family History  Problem Relation Age of Onset  . Hypertension Mother   . Breast cancer Mother   . Rheum arthritis Maternal Grandmother     History   Social History  . Marital Status: Married    Spouse Name: N/A    Number of Children: N/A  . Years of Education: N/A   Occupational History  . Not on file.   Social History Main Topics  . Smoking status: Current Everyday Smoker -- 1.0 packs/day for 40 years    Types: Cigarettes  . Smokeless tobacco: Not on file   Comment: started smoking in 1969  . Alcohol Use: No  . Drug Use: No  . Sexually Active: Not on file   Other Topics Concern  . Not on file   Social History Narrative  . No narrative on file    Past Surgical History  Procedure Date  . Appendectomy   . Back surgery   . Cholecystectomy   . Coronary stent placement   . Breast surgery     benign tumors    Allergies  Allergen Reactions  . Aspirin   . Penicillins Hives and Itching    Prescriptions prior to admission  Medication Sig Dispense Refill  .  albuterol (PROVENTIL) (2.5 MG/3ML) 0.083% nebulizer solution Take 2.5 mg by nebulization every 6 (six) hours as needed. For wheezing/shortness of breath      . atorvastatin (LIPITOR) 20 MG tablet Take 20 mg by mouth at bedtime.       Marland Kitchen buPROPion (WELLBUTRIN XL) 150 MG 24 hr tablet Take 150 mg by mouth daily. Use as directed       . citalopram (CELEXA) 20 MG tablet Take 20 mg by mouth daily.       . clonazePAM (KLONOPIN) 1 MG tablet Take 1 mg by mouth 3 (three) times daily.      . fenofibrate 160 MG tablet Take 160 mg by mouth daily.       . Fluticasone-Salmeterol (ADVAIR DISKUS) 250-50 MCG/DOSE AEPB Inhale 1 puff into the lungs every 12 (twelve) hours.         . folic acid (FOLVITE) 1 MG tablet Take 1 mg by mouth daily.       Marland Kitchen gabapentin (NEURONTIN) 300 MG capsule Take 300 mg by mouth 4 (four) times daily.      . isosorbide mononitrate (IMDUR) 30 MG 24 hr tablet Take 30 mg by mouth daily.      Marland Kitchen lisinopril (PRINIVIL,ZESTRIL) 5 MG tablet Take 5 mg by mouth daily.       . metoprolol tartrate (LOPRESSOR) 25 MG tablet Take 12.5 mg by mouth 2 (two) times daily.      . montelukast (SINGULAIR) 10 MG tablet Take 10 mg by mouth at bedtime.       . nitroGLYCERIN (NITROSTAT) 0.4 MG SL tablet Place 0.4 mg under the tongue every 5 (five) minutes as needed. For chest pain      . oxyCODONE-acetaminophen (PERCOCET) 10-325 MG per tablet Take 1 tablet by mouth every 8 (eight) hours as needed. For pain      . OXYGEN-HELIUM IN Inhale into the lungs continuous. 2 liters /IXL       . pantoprazole (PROTONIX) 40 MG tablet Take 40 mg by mouth daily.       Marland Kitchen tiotropium (SPIRIVA) 18 MCG inhalation capsule Place 18 mcg into inhaler and inhale daily.       Marland Kitchen warfarin (COUMADIN) 3 MG tablet Take 1.5-3 mg by mouth daily. Takes 1.5mg  on Monday and Friday and 3 mg on all other days        Review of systems complete and found to be negative unless listed above    Physical Exam: Blood pressure 93/57, pulse 94, temperature 97.8 F (36.6 C), temperature source Oral, resp. rate 20, height 5\' 4"  (1.626 m), weight 133 lb 11.2 oz (60.646 kg), SpO2 93.00%. General: Well developed, well nourished, NAD  HEENT: OP clear, mucus membranes moist  SKIN: warm, dry. No rashes.  Neuro: No focal deficits  Musculoskeletal: Muscle strength 5/5 all ext  Psychiatric: Mood and affect normal  Neck: No JVD, no carotid bruits, no thyromegaly, no lymphadenopathy.  Lungs: Poor air movement bilaterally. Diffuse wheezes and rhonci. No crackles  Cardiovascular: Regular rate and rhythm. No murmurs, gallops or rubs.  Abdomen:Soft. Bowel sounds present. Non-tender.  Extremities: No lower extremity edema.  Pulses are 2 + in the bilateral DP/PT.   Labs:   Lab Results  Component Value Date   WBC 4.5 07/09/2011   HGB 11.1* 07/09/2011   HCT 33.6* 07/09/2011   MCV 91.6 07/09/2011   PLT 221 07/09/2011    Lab 07/09/11 1249 07/09/11 0508  NA -- 137  K 4.1 --  CL --  97  CO2 -- 29  BUN -- 9  CREATININE -- 0.64  CALCIUM -- 8.4  PROT -- --  BILITOT -- --  ALKPHOS -- --  ALT -- --  AST -- --  GLUCOSE -- 174*   Lab Results  Component Value Date   CKTOTAL 112 07/09/2011   CKMB 3.5 07/09/2011   TROPONINI <0.30 07/09/2011       Radiology: CXR 3/0/13: Findings: Trachea is midline. Heart size normal. Lungs are  hyperinflated. Scarring at the left costophrenic angle. Lungs are  otherwise clear. No pleural fluid.  Osteopenia with multiple vertebroplasties. Lower thoracic or upper  lumbar compression fracture is stable.  IMPRESSION:  COPD without acute finding.   EKG: NSR, rate 98 bpm. 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 Baseline artifact.   ASSESSMENT AND PLAN:   1. Chest pain: This patient has a reported history of CAD with previous MI and stent placement although this was all in another hospital. She has been seen in our [facility] cardiology office by Dr. Andee Lineman and has had a stress test in 2012 that showed lateral wall defect without ischemia changes per report. She has not had further cardiac workup over the last year. She has continued to smoke and because of this, she could very well have progression of her CAD. Initial cardiac markers are negative. EKG does not show acute changes.  I agree with cycling cardiac enzymes after this isolated episode of chest pain. If she has recurrence of chest pain or elevation of cardiac markers, we will consider cardiac cath. Would get am EKG. Tobacco cessation is strongly encouraged in this patient who wears supplemental oxygen. Would resume beta blocker as BP tolerates. Holding Imdur for now with hypotension. We will follow with you.     Signed: Tayen Narang 07/09/2011, 1:38 PM

## 2011-07-09 NOTE — Clinical Documentation Improvement (Signed)
RESPIRATORY FAILURE DOCUMENTATION CLARIFICATION QUERY   THIS DOCUMENT IS NOT A PERMANENT PART OF THE MEDICAL RECORD  TO RESPOND TO THE THIS QUERY, FOLLOW THE INSTRUCTIONS BELOW:  1. If needed, update documentation for the patient's encounter via the notes activity.  2. Access this query again and click edit on the In [employer].  3. After updating, or not, click F2 to complete all highlighted (required) fields concerning your review. Select "additional documentation in the medical record" OR "no additional documentation provided".  4. Click Sign note button.  5. The deficiency will fall out of your In Basket *Please let us know if you are not able to complete this workflow by phone or e-mail (listed below).  Please update your documentation within the medical record to reflect your response to this query.                                                                                    07/09/11  Dear Dr. Ashley Royalty Marton Redwood,  In a better effort to capture your patient's severity of illness, reflect appropriate length of stay and utilization of resources, a review of the patient medical record has revealed the following indicators.  THANK YOU.  Possible Clinical Conditions? - Chronic Respiratory Failure - Acute on Chronic Respiratory Failure - Other Condition (please specify) - Cannot Clinically Determine  Supporting Information: - Risk Factors: "severe COPD on home O2 3L", - Signs&Symptoms: wheezing, increased dyspnea, AE COPD, tachycardia - O2 Mode:  O2 5L [place] - Respiratory Treatment: Xopenex, Proventyl, Atrovent  You may use possible, probable, or suspect with inpatient documentation. possible, probable, suspected diagnoses MUST be documented at the time of discharge  Reviewed: additional documentation in the medical record  Thank You,  Beverley Fiedler RN Clinical Documentation Specialist: CELL: [phone] Health Information Management [place]

## 2011-07-09 NOTE — Progress Notes (Signed)
Dr. Ashley Royalty gave order to hold this am's imdur, lisinopril, lopressor.

## 2011-07-09 NOTE — Progress Notes (Addendum)
At 0845 pt complained of chest pain 7 out of 10 A 0850: VS =obtained HR=99 O2= 91% 5L BP=113/66 EKG obtained Nitro Given at=0855 At 0900 CP=6 out of 10 VS=93/59 HR=101 2nd nitro not given Dr. Ashley Royalty paged

## 2011-07-09 NOTE — Progress Notes (Signed)
ANTICOAGULATION CONSULT NOTE - Follow Up Consult  Pharmacy Consult for Coumadin Indication: h/o DVT and CVA  Allergies  Allergen Reactions  . Aspirin   . Penicillins Hives and Itching    Patient Measurements: Height: 5\' 4"  (162.6 cm) Weight: 133 lb 11.2 oz (60.646 kg) IBW/kg (Calculated) : 54.7  Heparin Dosing Weight:   Vital Signs: Temp: 97.8 F (36.6 C) (03/21 0500) Temp src: Oral (03/21 0500) BP: 93/57 mmHg (03/21 0928) Pulse Rate: 94  (03/21 0500)  Labs:  Aimee Sanders 07/09/11 0912 07/09/11 0508 07/08/11 2343 07/08/11 1732 07/08/11 1503 07/08/11 1502  HGB -- 11.1* -- -- -- 12.1  HCT -- 33.6* -- -- -- 36.5  PLT -- 221 -- -- -- 223  APTT -- -- -- -- -- --  LABPROT -- 32.2* -- 36.5* -- --  INR -- 3.07* -- 3.61* -- --  HEPARINUNFRC -- -- -- -- -- --  CREATININE -- 0.64 -- -- -- 0.76  CKTOTAL 112 -- 57 -- -- --  CKMB 3.5 -- 1.9 -- -- --  TROPONINI <0.30 -- <0.30 -- <0.30 --   Estimated Creatinine Clearance: 59.7 ml/min (by C-G formula based on Cr of 0.64).  Assessment: upper respiratory symptoms and increasing SOB  Anticoag: h/o DVT and CVA on (Home Coumadin regimen of 3mg  daily, except 1.5mg  on Monday and Fridays) for h/o CVA. Admit INR (3.61). INR down to 3.07 this am.  ID: Tmax 100.1, WBC 4.5. Scr 0.64. Treat for COPD exac. 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

## 2011-07-09 NOTE — Progress Notes (Signed)
Pt complained of iv potassium burning Potassium stopped. md paged

## 2011-07-09 NOTE — H&P (Signed)
PCP:   Bennie Pierini, FNP, FNP   Chief Complaint: Shortness of breath for the past  5 days   HPI: Aimee Sanders is an 66 y.o. female with history of severe COPD on 3 L home O2, unfortunately an active tobacco use, known coronary disease status post prior MI x2, history of congestive heart failure, DVT on chronic anticoagulation, history of sleep apnea, presents to the emergency room with 5 days history of upper respiratory symptoms, and progressively more short of breath. She did not have any chest pain, nausea vomiting. In the emergency room she was given multiple neb treatment without improvement of her symptoms. Chest x-ray showed osteopenia, emphysema, but no acute process otherwise. She was noted to have normal renal function, white count, hemoglobin, and negative cardiac markers. Her INR was elevated at 3. 60. Hospitalist was asked to admit her for COPD exacerbation. I asked the IV Zithromax be started.  Rewiew of Systems:  The patient denies anorexia, fever, weight loss,, vision loss, decreased hearing, hoarseness, chest pain, syncope, , peripheral edema, balance deficits, hemoptysis, abdominal pain, melena, hematochezia, severe indigestion/heartburn, hematuria, incontinence, genital sores, muscle weakness, suspicious skin lesions, transient blindness, difficulty walking, depression, unusual weight change, abnormal bleeding, enlarged lymph nodes, angioedema, and breast masses   Past Medical History  Diagnosis Date  . Myocardial infarction     times 2  . CHF (congestive heart failure)   . Stroke   . Deep vein thrombosis     Phlebitis  . Hyperlipidemia   . Sleep apnea   . 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 Coronary artery disease     Past Surgical History  Procedure Date  . Appendectomy   . Back surgery   . Cholecystectomy   . Coronary stent placement   . Breast surgery     benign tumors     Medications:  HOME MEDS: Prior to Admission medications   Medication Sig Start Date End Date Taking? Authorizing Provider  albuterol (PROVENTIL) (2.5 MG/3ML) 0.083% nebulizer solution Take 2.5 mg by nebulization every 6 (six) hours as needed. For wheezing/shortness of breath   Yes Historical Provider, MD  atorvastatin (LIPITOR) 20 MG tablet Take 20 mg by mouth at bedtime.    Yes Historical Provider, MD  buPROPion (WELLBUTRIN XL) 150 MG 24 hr tablet Take 150 mg by mouth daily. Use as directed    Yes Historical Provider, MD  citalopram (CELEXA) 20 MG tablet Take 20 mg by mouth daily.    Yes Historical Provider, MD  clonazePAM (KLONOPIN) 1 MG tablet Take 1 mg by mouth 3 (three) times daily.   Yes Historical Provider, MD  fenofibrate 160 MG tablet Take 160 mg by mouth daily.    Yes Historical Provider, MD  Fluticasone-Salmeterol (ADVAIR DISKUS) 250-50 MCG/DOSE AEPB Inhale 1 puff into the lungs every 12 (twelve) hours.    Yes Historical Provider, MD  folic acid (FOLVITE) 1 MG tablet Take 1 mg by mouth daily.    Yes Historical Provider, MD  gabapentin (NEURONTIN) 300 MG capsule Take 300 mg by mouth 4 (four) times daily.   Yes Historical Provider, MD  isosorbide mononitrate (IMDUR) 30 MG 24 hr tablet Take 30 mg by mouth daily. 04/06/11 04/05/12 Yes June Leap, MD  lisinopril (PRINIVIL,ZESTRIL) 5 MG tablet Take 5 mg by mouth daily.    Yes Historical Provider, MD  metoprolol tartrate (LOPRESSOR) 25 MG tablet Take 12.5  mg by mouth 2 (two) times daily.   Yes Historical Provider, MD  montelukast (SINGULAIR) 10 MG tablet Take 10 mg by mouth at bedtime.    Yes Historical Provider, MD  nitroGLYCERIN (NITROSTAT) 0.4 MG SL tablet Place 0.4 mg under the tongue every 5 (five) minutes as needed. For chest pain   Yes Historical Provider, MD  oxyCODONE-acetaminophen (PERCOCET) 10-325 MG per tablet Take 1 tablet by mouth every 8 (eight) hours as needed. For pain   Yes Historical Provider, MD  OXYGEN-HELIUM IN  Inhale into the lungs continuous. 2 liters /[place]    Yes Historical Provider, MD  pantoprazole (PROTONIX) 40 MG tablet Take 40 mg by mouth daily.    Yes Historical Provider, MD  tiotropium (SPIRIVA) 18 MCG inhalation capsule Place 18 mcg into inhaler and inhale daily.    Yes Historical Provider, MD  warfarin (COUMADIN) 3 MG tablet Take 1.5-3 mg by mouth daily. Takes 1.5mg  on Monday and Friday and 3 mg on all other days   Yes Historical Provider, MD     Allergies:  Allergies  Allergen Reactions  . Aspirin   . Penicillins Hives and Itching    Social History:   reports that she has been smoking Cigarettes.  She has a 40 pack-year smoking history. She does not have any smokeless tobacco history on file. She reports that she does not drink alcohol or use illicit drugs.  Family History: Family History  Problem Relation Age of Onset  . Hypertension Mother   . Breast cancer Mother   . Rheum arthritis Maternal Grandmother      Physical Exam: Filed Vitals:   07/08/11 2100 07/08/11 2134 07/08/11 2200 07/08/11 2342  BP: 120/93  116/57   Pulse: 128  124   Temp:      TempSrc:      Resp: 32  21   SpO2: 90% 93% 93% 93%   Blood pressure 116/57, pulse 124, temperature 99.4 F (37.4 C), temperature source Oral, resp. rate 21, SpO2 93.00%.  GEN:  Pleasant  person lying in the stretcher in no acute distress; cooperative with exam. She is short of breath. PSYCH:  alert and oriented x4; does not appear anxious does not appear depressed; affect is normal HEENT: Mucous membranes pink and anicteric; PERRLA; EOM intact; no cervical lymphadenopathy nor thyromegaly or carotid bruit; no JVD; Breasts:: Not examined CHEST WALL: No tenderness CHEST: Much decreased breath sounds, both inspiratory and expiratory wheezes with no crackles. HEART: Regular rate and rhythm; no murmurs rubs or gallops BACK: No kyphosis or scoliosis; no CVA tenderness ABDOMEN: Obese, soft non-tender; no masses, no organomegaly,  normal abdominal bowel sounds; no pannus; no intertriginous candida. Rectal Exam: Not done EXTREMITIES: No bone or joint deformity; age-appropriate arthropathy of the hands and knees; no edema; no ulcerations. Genitalia: not examined PULSES: 2+ and symmetric SKIN: Normal hydration no rash or ulceration CNS: Cranial nerves 2-12 grossly intact no focal neurologic deficit   Labs & Imaging Results for orders placed during the hospital encounter of 07/08/11 (from the past 48 hour(s))  CBC     Status: Normal   Collection Time   07/08/11  3:02 PM      Component Value Range Comment   WBC 5.9  4.0 - 10.5 (K/uL)    RBC 3.99  3.87 - 5.11 (MIL/uL)    Hemoglobin 12.1  12.0 - 15.0 (g/dL)    HCT 72.5  36.6 - 44.0 (%)    MCV 91.5  78.0 - 100.0 (fL)  MCH 30.3  26.0 - 34.0 (pg)    MCHC 33.2  30.0 - 36.0 (g/dL)    RDW 16.1  09.6 - 04.5 (%)    Platelets 223  150 - 400 (K/uL)   BASIC METABOLIC PANEL     Status: Abnormal   Collection Time   07/08/11  3:02 PM      Component Value Range Comment   Sodium 139  135 - 145 (mEq/L)    Potassium 3.3 (*) 3.5 - 5.1 (mEq/L)    Chloride 95 (*) 96 - 112 (mEq/L)    CO2 35 (*) 19 - 32 (mEq/L)    Glucose, Bld 93  70 - 99 (mg/dL)    BUN 8  6 - 23 (mg/dL)    Creatinine, Ser 4.09  0.50 - 1.10 (mg/dL)    Calcium 8.6  8.4 - 10.5 (mg/dL)    GFR calc non Af Amer 86 (*) >90 (mL/min)    GFR calc Af Amer >90  >90 (mL/min)   TROPONIN I     Status: Normal   Collection Time   07/08/11  3:03 PM      Component Value Range Comment   Troponin I <0.30  <0.30 (ng/mL)   PROTIME-INR     Status: Abnormal   Collection Time   07/08/11  5:32 PM      Component Value Range Comment   Prothrombin Time 36.5 (*) 11.6 - 15.2 (seconds)    INR 3.61 (*) 0.00 - 1.49    CARDIAC PANEL(CRET KIN+CKTOT+MB+TROPI)     Status: Normal   Collection Time   07/08/11 11:43 PM      Component Value Range Comment   Total CK 57  7 - 177 (U/L)    CK, MB 1.9  0.3 - 4.0 (ng/mL)    Troponin I <0.30  <0.30  (ng/mL)    Relative Index RELATIVE INDEX IS INVALID  0.0 - 2.5     Dg Chest 2 View  07/08/2011  *RADIOLOGY REPORT*  Clinical Data: Cough and wheezing.  Left-sided chest pain.  CHEST - 2 VIEW  Comparison: 05/06/2011 and CT chest 06/13/2010.  Findings: Trachea is midline.  Heart size normal.  Lungs are hyperinflated.  Scarring at the left costophrenic angle.  Lungs are otherwise clear.  No pleural fluid.  Osteopenia with multiple vertebroplasties.  Lower thoracic or upper lumbar compression fracture is stable.  IMPRESSION: COPD without acute finding.  Original Report Authenticated By: Reyes Ivan, M.D.      Assessment Present on Admission:  .COPD with acute exacerbation .SMOKER .SLEEP APNEA .Chronic diastolic heart failure .C V A / STROKE   PLAN: She was given IV steroid and I will continue lower dose intravenously. She does have slight tachycardia and will treat her with Xopenex. Continue IV Zithromax, as evident shows COPD exacerbation does better with antibiotic. Will recruit pharmacist to help with anticoagulation. She is not in frank failure. I admonished her for her continued smoking, and I pleaded that she quit smoking indefinitely.   Other plans as per orders.    Aimee Sanders 07/09/2011, 5:32 AM

## 2011-07-09 NOTE — Progress Notes (Signed)
Subjective: Pt states that chest pain now resolved since this morning. Objective: Filed Vitals:   07/09/11 0928 07/09/11 1400 07/09/11 1812 07/09/11 2009  BP: 93/57 98/43 110/69   Pulse:  87    Temp:  98 F (36.7 C)    TempSrc:  Oral    Resp:  18    Height:      Weight:      SpO2:  94%  94%   Weight change:   Intake/Output Summary (Last 24 hours) at 07/09/11 2029 Last data filed at 07/09/11 0600  Gross per 24 hour  Intake    240 ml  Output      0 ml  Net    240 ml    General: Alert, awake, oriented x3, in no acute distress. Pt appears older than her stated age. HEENT: /AT [place]ER[place], EOMI Neck: Trachea midline,  no masses, no thyromegal,y no JVD, no carotid bruit OROPHARYNX:  Moist, No exudate/ erythema/lesions.  Heart: Regular rate and rhythm, without murmurs, rubs, gallops, PMI non-displaced, no heaves or thrills on palpation.  Lungs: Clear to auscultation, no wheezing or rhonchi noted. No increased vocal fremitus resonant to percussion  Abdomen: Soft, nontender, nondistended, positive bowel sounds, no masses no hepatosplenomegaly noted..  Neuro: No focal neurological deficits noted cranial nerves II through XII grossly intact. DTRs 2+ bilaterally upper and lower extremities. Strength functional in bilateral upper and lower extremities.    Lab Results:  Basename 07/09/11 1539 07/09/11 1249 07/09/11 0912 07/09/11 0508 07/08/11 1502  NA -- -- -- 137 139  K 4.6 4.1 -- -- --  CL -- -- -- 97 95*  CO2 -- -- -- 29 35*  GLUCOSE -- -- -- 174* 93  BUN -- -- -- 9 8  CREATININE -- -- -- 0.64 0.76  CALCIUM -- -- -- 8.4 8.6  MG -- -- 1.7 -- --  PHOS -- -- -- -- --   No results found for this basename: AST:2,ALT:2,ALKPHOS:2,BILITOT:2,PROT:2,ALBUMIN:2 in the last 72 hours No results found for this basename: LIPASE:2,AMYLASE:2 in the last 72 hours  Basename 07/09/11 0508 07/08/11 1502  WBC 4.5 5.9  NEUTROABS -- --  HGB 11.1* 12.1  HCT 33.6* 36.5  MCV 91.6 91.5  PLT 221 223     Basename 07/09/11 1539 07/09/11 0912 07/08/11 2343  CKTOTAL 131 112 57  CKMB 4.0 3.5 1.9  CKMBINDEX -- -- --  TROPONINI <0.30 <0.30 <0.30   No components found with this basename: POCBNP:3 No results found for this basename: DDIMER:2 in the last 72 hours No results found for this basename: HGBA1C:2 in the last 72 hours No results found for this basename: CHOL:2,HDL:2,LDLCALC:2,TRIG:2,CHOLHDL:2,LDLDIRECT:2 in the last 72 hours  Basename 07/08/11 2344  TSH 0.493  T4TOTAL --  T3FREE --  THYROIDAB --   No results found for this basename: VITAMINB12:2,FOLATE:2,FERRITIN:2,TIBC:2,IRON:2,RETICCTPCT:2 in the last 72 hours  Micro Results: No results found for this or any previous visit (from the past 240 hour(s)).  Studies/Results: Dg Chest 2 View  07/08/2011  *RADIOLOGY REPORT*  Clinical Data: Cough and wheezing.  Left-sided chest pain.  CHEST - 2 VIEW  Comparison: 05/06/2011 and CT chest 06/13/2010.  Findings: Trachea is midline.  Heart size normal.  Lungs are hyperinflated.  Scarring at the left costophrenic angle.  Lungs are otherwise clear.  No pleural fluid.  Osteopenia with multiple vertebroplasties.  Lower thoracic or upper lumbar compression fracture is stable.  IMPRESSION: COPD without acute finding.  Original Report Authenticated By: Reyes Ivan, M.D.  Medications: I have reviewed the patient's current medications. Scheduled Meds:   . sodium chloride   Intravenous STAT  . azithromycin  500 mg Intravenous Once  . azithromycin  500 mg Intravenous QHS  . buPROPion  150 mg Oral Daily  . citalopram  20 mg Oral Daily  . clonazePAM  1 mg Oral TID  . fenofibrate  160 mg Oral Daily  . Fluticasone-Salmeterol  1 puff Inhalation Q12H  . folic acid  1 mg Oral Daily  . gabapentin  300 mg Oral QID  . isosorbide mononitrate  30 mg Oral Daily  . levalbuterol  0.63 mg Nebulization Q6H  . lisinopril  5 mg Oral Daily  . LORazepam  1 mg Oral Once  . methylPREDNISolone  (SOLU-MEDROL) injection  60 mg Intravenous Q4H  . metoprolol tartrate  12.5 mg Oral BID  . montelukast  10 mg Oral QHS  . nitroGLYCERIN      . pantoprazole  40 mg Oral Daily  . potassium chloride  40 mEq Oral Q2H  . potassium chloride  40 mEq Oral BID  . simvastatin  20 mg Oral q1800  . sodium chloride  3 mL Intravenous Q12H  . tiotropium  18 mcg Inhalation Daily  . warfarin  1.5 mg Oral ONCE-1800  . Warfarin - Pharmacist Dosing Inpatient   Does not apply q1800  . DISCONTD: potassium chloride  10 mEq Intravenous Q1 Hr x 2  . DISCONTD: sodium chloride  3 mL Intravenous Q12H   Continuous Infusions:  PRN Meds:.sodium chloride, HYDROmorphone (DILAUDID) injection, levalbuterol, oxyCODONE, oxyCODONE-acetaminophen, sodium chloride, DISCONTD: oxyCODONE-acetaminophen Assessment/Plan: Patient Active Hospital Problem List: COPD with acute exacerbation (07/08/2011)   Assessment: Continue solumedrol and B-agonist.    SMOKER (05/13/2010)   Assessment: Pt states that she has no intention of quitting smoking.    Chronic diastolic heart failure (07/09/2010)   Assessment: Well compensated   Chest pain (07/09/2011)   Assessment: Resolved. Appreciate cardiology input.    LOS: 1 day

## 2011-07-09 NOTE — Progress Notes (Signed)
07/09/2011 [facility], Bosie Clos [facility] Case Management Note [phone]    CARE MANAGEMENT NOTE 07/09/2011  Patient:  Aimee Sanders, Aimee Sanders   Account Number:  [id number]  Date Initiated:  07/09/2011  Documentation initiated by:  Fransico Michael  Subjective/Objective Assessment:   admitted on 07/08/11 with shortness of breath x 5 days     Action/Plan:   prior to admission, patient lived at home with support from family and friends.   Anticipated DC Date:  07/12/2011   Anticipated DC Plan:  HOME W HOME HEALTH SERVICES      DC Planning Services  CM consult      Choice offered to / List presented to:             Status of service:  In process, will continue to follow Medicare Important Message given?   (If response is "NO", the following Medicare IM given date fields will be blank) Date Medicare IM given:   Date Additional Medicare IM given:    Discharge Disposition:    Per UR Regulation:  Reviewed for med. necessity/level of care/duration of stay  If discussed at Long Length of Stay Meetings, dates discussed:    Comments:  07/09/11-1327-J.Lutricia Horsfall [phone]      66yo female patient admitted on 07/08/11 with c/o shortness of breath x 5 days. Patient has history of CHF, MIx 2, and CVA. Prior to admission, patient lived at home with support from family and friends. Has home O2 with [facility]. NCM will continue to monitor for discharge/home needs.

## 2011-07-09 NOTE — ED Provider Notes (Signed)
Medical screening examination/treatment/procedure(s) were conducted as a shared visit with non-physician practitioner(s) and myself.  I personally evaluated the patient during the encounter  This female who appears older than her stated age presents with several days of URI like symptoms and one day of worsening dyspnea.  On my exam the patient was in no distress, speaking clearly.  The patient's labs and chest x-ray were most consistent with a COPD exacerbation, though ACS, and CHF exacerbation were both considered.  At the conclusion of my shift, the patient's care had been signed out to [facility].  I have seen both at the ECG and a chest x-ray and agree with the interpretations  Gerhard Munch, MD 07/09/11 [phone]

## 2011-07-09 NOTE — Progress Notes (Signed)
Utilization review completed.  

## 2011-07-10 LAB — BASIC METABOLIC PANEL
CO2: 29 mEq/L (ref 19–32)
Calcium: 9 mg/dL (ref 8.4–10.5)
GFR calc non Af Amer: >90 mL/min (ref >90)
Sodium: 139 mEq/L (ref 135–145)

## 2011-07-10 LAB — PROTIME-INR: INR: 2.81 — ABNORMAL HIGH (ref 0.00–1.49)

## 2011-07-10 MED ORDER — WARFARIN SODIUM 1 MG PO TABS
1.5000 mg | ORAL_TABLET | Freq: Once | ORAL | Status: AC
  Administered 2011-07-10: 1.5 mg via ORAL
  Filled 2011-07-10: qty 1

## 2011-07-10 NOTE — Progress Notes (Signed)
Subjective: Pt states that she does not feel much better than she did yesterday. Nursing reports that the patient has been sleeping most of the day but is very easily arousable. Objective: Filed Vitals:   07/10/11 1400 07/10/11 1422 07/10/11 1423 07/10/11 1500  BP: 71/30 91/56 91/61    Pulse: 59     Temp: 97.5 F (36.4 C)     TempSrc: Oral     Resp: 22     Height:      Weight:      SpO2: 91%   95%   Weight change: -0.272 kg (-9.6 oz)  Intake/Output Summary (Last 24 hours) at 07/10/11 2031 Last data filed at 07/10/11 1700  Gross per 24 hour  Intake    580 ml  Output      0 ml  Net    580 ml    General: Alert, awake, oriented x3, in no acute distress. Pt appears older than her stated age. HEENT: [place]/AT PEER[place], EOMI Neck: Trachea midline,  no masses, no thyromegal,y no JVD, no carotid bruit OROPHARYNX:  Moist, No exudate/ erythema/lesions.  Heart: Regular rate and rhythm, without murmurs, rubs, gallops, PMI non-displaced, no heaves or thrills on palpation.  Lungs: Clear to auscultation, no wheezing or rhonchi noted, but has decreased air into to a lung fields.  Abdomen: Soft, nontender, nondistended, positive bowel sounds, no masses no hepatosplenomegaly noted..  Neuro: No focal neurological deficits noted cranial nerves II through XII grossly intact. DTRs 2+ bilaterally upper and lower extremities. Strength functional in bilateral upper and lower extremities.   Lab Results:  Basename 07/10/11 0650 07/09/11 1539 07/09/11 0912 07/09/11 0508  NA 139 -- -- 137  K 5.0 4.6 -- --  CL 105 -- -- 97  CO2 29 -- -- 29  GLUCOSE 183* -- -- 174*  BUN 12 -- -- 9  CREATININE 0.64 -- -- 0.64  CALCIUM 9.0 -- -- 8.4  MG -- -- 1.7 --  PHOS -- -- -- --   No results found for this basename: AST:2,ALT:2,ALKPHOS:2,BILITOT:2,PROT:2,ALBUMIN:2 in the last 72 hours No results found for this basename: LIPASE:2,AMYLASE:2 in the last 72 hours  Basename 07/09/11 0508 07/08/11 1502  WBC 4.5 5.9    NEUTROABS -- --  HGB 11.1* 12.1  HCT 33.6* 36.5  MCV 91.6 91.5  PLT 221 223    Basename 07/09/11 1539 07/09/11 0912 07/08/11 2343  CKTOTAL 131 112 57  CKMB 4.0 3.5 1.9  CKMBINDEX -- -- --  TROPONINI <0.30 <0.30 <0.30   No components found with this basename: POCBNP:3 No results found for this basename: DDIMER:2 in the last 72 hours No results found for this basename: HGBA1C:2 in the last 72 hours No results found for this basename: CHOL:2,HDL:2,LDLCALC:2,TRIG:2,CHOLHDL:2,LDLDIRECT:2 in the last 72 hours  Basename 07/08/11 2344  TSH 0.493  T4TOTAL --  T3FREE --  THYROIDAB --   No results found for this basename: VITAMINB12:2,FOLATE:2,FERRITIN:2,TIBC:2,IRON:2,RETICCTPCT:2 in the last 72 hours  Micro Results: No results found for this or any previous visit (from the past 240 hour(s)).  Studies/Results: Dg Chest 2 View  07/08/2011  *RADIOLOGY REPORT*  Clinical Data: Cough and wheezing.  Left-sided chest pain.  CHEST - 2 VIEW  Comparison: 05/06/2011 and CT chest 06/13/2010.  Findings: Trachea is midline.  Heart size normal.  Lungs are hyperinflated.  Scarring at the left costophrenic angle.  Lungs are otherwise clear.  No pleural fluid.  Osteopenia with multiple vertebroplasties.  Lower thoracic or upper lumbar compression fracture is stable.  IMPRESSION:  COPD without acute finding.  Original Report Authenticated By: Reyes Ivan, M.D.    Medications: I have reviewed the patient's current medications. Scheduled Meds:    . sodium chloride   Intravenous STAT  . azithromycin  500 mg Intravenous QHS  . buPROPion  150 mg Oral Daily  . citalopram  20 mg Oral Daily  . clonazePAM  1 mg Oral TID  . fenofibrate  160 mg Oral Daily  . Fluticasone-Salmeterol  1 puff Inhalation Q12H  . folic acid  1 mg Oral Daily  . gabapentin  300 mg Oral QID  . isosorbide mononitrate  30 mg Oral Daily  . levalbuterol  0.63 mg Nebulization Q6H  . lisinopril  5 mg Oral Daily  .  methylPREDNISolone (SOLU-MEDROL) injection  60 mg Intravenous Q4H  . metoprolol tartrate  12.5 mg Oral BID  . montelukast  10 mg Oral QHS  . pantoprazole  40 mg Oral Daily  . potassium chloride  40 mEq Oral BID  . simvastatin  20 mg Oral q1800  . sodium chloride  3 mL Intravenous Q12H  . tiotropium  18 mcg Inhalation Daily  . warfarin  1.5 mg Oral ONCE-1800  . Warfarin - Pharmacist Dosing Inpatient   Does not apply q1800   Continuous Infusions:  PRN Meds:.sodium chloride, levalbuterol, oxyCODONE, oxyCODONE-acetaminophen, sodium chloride Assessment/Plan: Patient Active Hospital Problem List: COPD with acute exacerbation (07/08/2011)   Assessment: Continue solumedrol for at least one more day. Continue B-agonist, and spiriva.    SMOKER (05/13/2010)   Assessment: Pt states that she has no intention of quitting smoking.    Chronic diastolic heart failure (07/09/2010)   Assessment: Well compensated   Chest pain (07/09/2011)   Assessment: Resolved. Appreciate cardiology input.    LOS: 2 days

## 2011-07-10 NOTE — Progress Notes (Signed)
SUBJECTIVE: Feeling better this am. Breathing is better. No recurrent chest pain.   BP 99/60  Pulse 63  Temp(Src) 97.5 F (36.4 C) (Oral)  Resp 18  Ht 5\' 4"  (1.626 m)  Wt 133 lb 1.6 oz (60.374 kg)  BMI 22.85 kg/m2  SpO2 95% No intake or output data in the 24 hours ending 07/10/11 0748  PHYSICAL EXAM General: Well developed, well nourished, in no acute distress. Alert and oriented x 3.  Psych:  Good affect, responds appropriately Neck: No JVD. No masses noted.  Lungs: Poor air movement bilaterally with scattered wheezes No rhonci noted.  Heart: RRR with no murmurs noted. Abdomen: Bowel sounds are present. Soft, non-tender.  Extremities: No lower extremity edema.   LABS: Basic Metabolic Panel:  Basename 07/09/11 1539 07/09/11 1249 07/09/11 0912 07/09/11 0508 07/08/11 1502  NA -- -- -- 137 139  K 4.6 4.1 -- -- --  CL -- -- -- 97 95*  CO2 -- -- -- 29 35*  GLUCOSE -- -- -- 174* 93  BUN -- -- -- 9 8  CREATININE -- -- -- 0.64 0.76  CALCIUM -- -- -- 8.4 8.6  MG -- -- 1.7 -- --  PHOS -- -- -- -- --   CBC:  Basename 07/09/11 0508 07/08/11 1502  WBC 4.5 5.9  NEUTROABS -- --  HGB 11.1* 12.1  HCT 33.6* 36.5  MCV 91.6 91.5  PLT 221 223   Cardiac Enzymes:  Basename 07/09/11 1539 07/09/11 0912 07/08/11 2343  CKTOTAL 131 112 57  CKMB 4.0 3.5 1.9  CKMBINDEX -- -- --  TROPONINI <0.30 <0.30 <0.30   Current Meds:    . sodium chloride   Intravenous STAT  . azithromycin  500 mg Intravenous QHS  . buPROPion  150 mg Oral Daily  . citalopram  20 mg Oral Daily  . clonazePAM  1 mg Oral TID  . fenofibrate  160 mg Oral Daily  . Fluticasone-Salmeterol  1 puff Inhalation Q12H  . folic acid  1 mg Oral Daily  . gabapentin  300 mg Oral QID  . isosorbide mononitrate  30 mg Oral Daily  . levalbuterol  0.63 mg Nebulization Q6H  . lisinopril  5 mg Oral Daily  . methylPREDNISolone (SOLU-MEDROL) injection  60 mg Intravenous Q4H  . metoprolol tartrate  12.5 mg Oral BID  .  montelukast  10 mg Oral QHS  . nitroGLYCERIN      . pantoprazole  40 mg Oral Daily  . potassium chloride  40 mEq Oral Q2H  . potassium chloride  40 mEq Oral BID  . simvastatin  20 mg Oral q1800  . sodium chloride  3 mL Intravenous Q12H  . tiotropium  18 mcg Inhalation Daily  . warfarin  1.5 mg Oral ONCE-1800  . Warfarin - Pharmacist Dosing Inpatient   Does not apply q1800  . DISCONTD: potassium chloride  10 mEq Intravenous Q1 Hr x 2     ASSESSMENT AND PLAN:   1. Chest pain: This patient has a reported history of CAD with previous MI and stent placement although this was all in another hospital. She has been seen in our [facility] cardiology office by Dr. Andee Lineman and has had a stress test in 2012 that showed lateral wall defect without ischemia changes per report. She has not had further cardiac workup over the last year. She has continued to smoke and because of this, she could very well have progression of her CAD. Cardiac markers are negative  x3. EKG does not show acute changes. No evidence of ACS.    - I do not recommend further ischemic testing at this time.     - Tobacco cessation is strongly encouraged in this patient who wears supplemental oxygen. She does not wish to stop smoking and she knows that this is detrimental to her health.    - Would resume beta blocker as BP tolerates.     - Holding Imdur for now with hypotension.  Will sign off for now. Please call our team with questions. She can follow up with Dr. Andee Lineman in the [facility] cardiology office after discharge.     Jaquise Faux  3/22/20137:48 AM

## 2011-07-10 NOTE — Plan of Care (Signed)
Problem: Phase I Progression Outcomes Goal: O2 sats > or equal 90% or at baseline Outcome: Not Progressing Patient home o2 dependent 3 liters, requiring 5 liters to maintain sats >90%.

## 2011-07-10 NOTE — Progress Notes (Signed)
ANTICOAGULATION CONSULT NOTE - Follow Up Consult  Pharmacy Consult for Coumadin Indication: h/o DVT and CVA  Allergies  Allergen Reactions  . Aspirin   . Penicillins Hives and Itching    Patient Measurements: Height: 5\' 4"  (162.6 cm) Weight: 133 lb 1.6 oz (60.374 kg) IBW/kg (Calculated) : 54.7  Heparin Dosing Weight:   Vital Signs: Temp: 97.5 F (36.4 C) (03/22 0500) Temp src: Oral (03/22 0500) BP: 112/68 mmHg (03/22 0943) Pulse Rate: 72  (03/22 0943)  Labs:  Aimee Sanders 07/10/11 0650 07/09/11 1539 07/09/11 0912 07/09/11 0508 07/08/11 2343 07/08/11 1732 07/08/11 1502  HGB -- -- -- 11.1* -- -- 12.1  HCT -- -- -- 33.6* -- -- 36.5  PLT -- -- -- 221 -- -- 223  APTT -- -- -- -- -- -- --  LABPROT 30.0* -- -- 32.2* -- 36.5* --  INR 2.81* -- -- 3.07* -- 3.61* --  HEPARINUNFRC -- -- -- -- -- -- --  CREATININE 0.64 -- -- 0.64 -- -- 0.76  CKTOTAL -- 131 112 -- 57 -- --  CKMB -- 4.0 3.5 -- 1.9 -- --  TROPONINI -- <0.30 <0.30 -- <0.30 -- --   Estimated Creatinine Clearance: 59.7 ml/min (by C-G formula based on Cr of 0.64).  Assessment: upper respiratory symptoms and increasing SOB  Anticoag: h/o DVT and CVA on (Home Coumadin regimen of 3mg  daily, except 1.5mg  on Monday and Fridays) for h/o CVA. Admit INR (3.61). INR down to 2.81 this am.  ID: Tmax Afebrile. WBC 4.5. Scr 0.64. Treat for COPD exac. Meds: IV Azithro,  Cards: MI x 2, CHF. 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 Merilynn Finland, PharmD, BCPS Clinical Staff Pharmacist Pager [phone]  07/10/2011,10:58 AM

## 2011-07-11 LAB — BASIC METABOLIC PANEL
Calcium: 8.9 mg/dL (ref 8.4–10.5)
Chloride: 103 mEq/L (ref 96–112)
Creatinine, Ser: 0.69 mg/dL (ref 0.50–1.10)
GFR calc Af Amer: >90 mL/min (ref >90)

## 2011-07-11 LAB — PROTIME-INR
INR: 2.63 — ABNORMAL HIGH (ref 0.00–1.49)
Prothrombin Time: 28.5 seconds — ABNORMAL HIGH (ref 11.6–15.2)

## 2011-07-11 MED ORDER — PREDNISONE 50 MG PO TABS
60.0000 mg | ORAL_TABLET | Freq: Every day | ORAL | Status: DC
  Administered 2011-07-12 – 2011-07-14 (×3): 60 mg via ORAL
  Filled 2011-07-11 (×5): qty 1

## 2011-07-11 MED ORDER — WARFARIN SODIUM 3 MG PO TABS
3.0000 mg | ORAL_TABLET | Freq: Once | ORAL | Status: AC
  Administered 2011-07-11: 3 mg via ORAL
  Filled 2011-07-11: qty 1

## 2011-07-11 MED ORDER — CLONAZEPAM 0.5 MG PO TABS
0.5000 mg | ORAL_TABLET | Freq: Three times a day (TID) | ORAL | Status: DC
  Administered 2011-07-11 – 2011-07-14 (×8): 0.5 mg via ORAL
  Filled 2011-07-11 (×8): qty 1

## 2011-07-11 NOTE — Progress Notes (Signed)
Subjective: Pt states that she feels much better today. I discussed her Klonopin dosing with her and she has agreed to try a lower dose as her current dose appears to cause her increased somnolence. Objective: Filed Vitals:   07/11/11 0224 07/11/11 0910 07/11/11 1400 07/11/11 1456  BP:   88/35   Pulse:   70   Temp:   97.9 F (36.6 C)   TempSrc:   Oral   Resp:   20   Height:      Weight:      SpO2: 90% 93% 92% 89%   Weight change:   Intake/Output Summary (Last 24 hours) at 07/11/11 1844 Last data filed at 07/11/11 1700  Gross per 24 hour  Intake    720 ml  Output      0 ml  Net    720 ml    General: Alert, awake, oriented x3, in no acute distress. More energetic today. HEENT: [place]/AT [place], EOMI Neck: Trachea midline,  no masses, no thyromegal,y no JVD, no carotid bruit OROPHARYNX:  Moist, No exudate/ erythema/lesions.  Heart: Regular rate and rhythm, without murmurs, rubs, gallops, PMI non-displaced, no heaves or thrills on palpation.  Lungs: Clear to auscultation, no wheezing or rhonchi noted, but has decreased air into to a lung fields.  Abdomen: Soft, nontender, nondistended, positive bowel sounds, no masses no hepatosplenomegaly noted..  Neuro: No focal neurological deficits noted cranial nerves II through XII grossly intact. DTRs 2+ bilaterally upper and lower extremities. Strength functional in bilateral upper and lower extremities.   Lab Results:  Basename 07/11/11 0800 07/10/11 0650 07/09/11 0912  NA 139 139 --  K 4.3 5.0 --  CL 103 105 --  CO2 33* 29 --  GLUCOSE 97 183* --  BUN 17 12 --  CREATININE 0.69 0.64 --  CALCIUM 8.9 9.0 --  MG -- -- 1.7  PHOS -- -- --   No results found for this basename: AST:2,ALT:2,ALKPHOS:2,BILITOT:2,PROT:2,ALBUMIN:2 in the last 72 hours No results found for this basename: LIPASE:2,AMYLASE:2 in the last 72 hours  Basename 07/09/11 0508  WBC 4.5  NEUTROABS --  HGB 11.1*  HCT 33.6*  MCV 91.6  PLT 221    Basename 07/09/11  1539 07/09/11 0912 07/08/11 2343  CKTOTAL 131 112 57  CKMB 4.0 3.5 1.9  CKMBINDEX -- -- --  TROPONINI <0.30 <0.30 <0.30   No components found with this basename: POCBNP:3 No results found for this basename: DDIMER:2 in the last 72 hours No results found for this basename: HGBA1C:2 in the last 72 hours No results found for this basename: CHOL:2,HDL:2,LDLCALC:2,TRIG:2,CHOLHDL:2,LDLDIRECT:2 in the last 72 hours  Basename 07/08/11 2344  TSH 0.493  T4TOTAL --  T3FREE --  THYROIDAB --   No results found for this basename: VITAMINB12:2,FOLATE:2,FERRITIN:2,TIBC:2,IRON:2,RETICCTPCT:2 in the last 72 hours  Micro Results: No results found for this or any previous visit (from the past 240 hour(s)).  Studies/Results: Dg Chest 2 View  07/08/2011  *RADIOLOGY REPORT*  Clinical Data: Cough and wheezing.  Left-sided chest pain.  CHEST - 2 VIEW  Comparison: 05/06/2011 and CT chest 06/13/2010.  Findings: Trachea is midline.  Heart size normal.  Lungs are hyperinflated.  Scarring at the left costophrenic angle.  Lungs are otherwise clear.  No pleural fluid.  Osteopenia with multiple vertebroplasties.  Lower thoracic or upper lumbar compression fracture is stable.  IMPRESSION: COPD without acute finding.  Original Report Authenticated By: Reyes Ivan, M.D.    Medications: I have reviewed the patient's current medications.  Scheduled Meds:    . azithromycin  500 mg Intravenous QHS  . buPROPion  150 mg Oral Daily  . citalopram  20 mg Oral Daily  . clonazePAM  1 mg Oral TID  . fenofibrate  160 mg Oral Daily  . Fluticasone-Salmeterol  1 puff Inhalation Q12H  . folic acid  1 mg Oral Daily  . gabapentin  300 mg Oral QID  . isosorbide mononitrate  30 mg Oral Daily  . levalbuterol  0.63 mg Nebulization Q6H  . lisinopril  5 mg Oral Daily  . methylPREDNISolone (SOLU-MEDROL) injection  60 mg Intravenous Q4H  . metoprolol tartrate  12.5 mg Oral BID  . montelukast  10 mg Oral QHS  . pantoprazole  40  mg Oral Daily  . simvastatin  20 mg Oral q1800  . sodium chloride  3 mL Intravenous Q12H  . tiotropium  18 mcg Inhalation Daily  . warfarin  3 mg Oral ONCE-1800  . Warfarin - Pharmacist Dosing Inpatient   Does not apply q1800  . DISCONTD: potassium chloride  40 mEq Oral BID   Continuous Infusions:  PRN Meds:.sodium chloride, levalbuterol, oxyCODONE, oxyCODONE-acetaminophen, sodium chloride Assessment/Plan: Patient Active Hospital Problem List: COPD with acute exacerbation (07/08/2011)   Assessment: We'll transition to by mouth prednisone today. Continue B-agonist, and spiriva.    SMOKER (05/13/2010)   Assessment: Pt states that she has no intention of quitting smoking.    Chronic diastolic heart failure (07/09/2010)   Assessment: Well compensated   Chest pain (07/09/2011)   Assessment: Resolved. Appreciate cardiology input.  Anxiety    Assessment : The patient is presently on Klonopin 1 mg by mouth 3 times a day I will decrease Klonopin to 0.5 mg by mouth 3 times a day.     LOS: 3 days

## 2011-07-11 NOTE — Progress Notes (Signed)
ANTICOAGULATION CONSULT NOTE - Follow Up Consult  Pharmacy Consult for coumadin Indication: h/o DVT and CVA  Allergies  Allergen Reactions  . Aspirin   . Penicillins Hives and Itching    Patient Measurements: Height: 5\' 4"  (162.6 cm) Weight: 133 lb 1.6 oz (60.374 kg) IBW/kg (Calculated) : 54.7  Heparin Dosing Weight:   Vital Signs:    Labs:  Basename 07/11/11 0800 07/10/11 0650 07/09/11 1539 07/09/11 0912 07/09/11 0508 07/08/11 2343 07/08/11 1502  HGB -- -- -- -- 11.1* -- 12.1  HCT -- -- -- -- 33.6* -- 36.5  PLT -- -- -- -- 221 -- 223  APTT -- -- -- -- -- -- --  LABPROT 28.5* 30.0* -- -- 32.2* -- --  INR 2.63* 2.81* -- -- 3.07* -- --  HEPARINUNFRC -- -- -- -- -- -- --  CREATININE 0.69 0.64 -- -- 0.64 -- --  CKTOTAL -- -- 131 112 -- 57 --  CKMB -- -- 4.0 3.5 -- 1.9 --  TROPONINI -- -- <0.30 <0.30 -- <0.30 --   Estimated Creatinine Clearance: 59.7 ml/min (by C-G formula based on Cr of 0.69).     Assessment: H/o DVT and CVA on home regimen of Coumadin 3 mg daily except 1.5 mg on Monday and Friday. Admit INR was 3.6  INR now down to 2.63. Will give home dose of 3 mg today. Goal of Therapy:  INR 2-3   Plan:  Coumadin 3 mg today. F/u INR tomorrow.   Eugene Garnet 07/11/2011,1:52 PM

## 2011-07-12 LAB — BASIC METABOLIC PANEL
BUN: 15 mg/dL (ref 6–23)
Calcium: 8.9 mg/dL (ref 8.4–10.5)
Creatinine, Ser: 0.67 mg/dL (ref 0.50–1.10)
GFR calc Af Amer: >90 mL/min (ref >90)
GFR calc non Af Amer: 90 mL/min — ABNORMAL LOW (ref >90)

## 2011-07-12 LAB — PROTIME-INR: Prothrombin Time: 32.8 seconds — ABNORMAL HIGH (ref 11.6–15.2)

## 2011-07-12 MED ORDER — WARFARIN SODIUM 1 MG PO TABS
1.5000 mg | ORAL_TABLET | Freq: Once | ORAL | Status: AC
  Administered 2011-07-12: 1.5 mg via ORAL
  Filled 2011-07-12: qty 1

## 2011-07-12 NOTE — Progress Notes (Signed)
ANTICOAGULATION CONSULT NOTE - Follow Up Consult  Pharmacy Consult for Coumadin Indication: h/o DVT and CVA  Allergies  Allergen Reactions  . Aspirin   . Penicillins Hives and Itching    Patient Measurements: Height: 5\' 4"  (162.6 cm) Weight: 133 lb 1.6 oz (60.374 kg) IBW/kg (Calculated) : 54.7  Heparin Dosing Weight:   Vital Signs: Temp: 97 F (36.1 C) (03/24 1400) Temp src: Oral (03/24 1400) BP: 94/54 mmHg (03/24 1400) Pulse Rate: 68  (03/24 1400)  Labs:  Basename 07/12/11 0700 07/11/11 0800 07/10/11 0650 07/09/11 1539  HGB -- -- -- --  HCT -- -- -- --  PLT -- -- -- --  APTT -- -- -- --  LABPROT 32.8* 28.5* 30.0* --  INR 3.15* 2.63* 2.81* --  HEPARINUNFRC -- -- -- --  CREATININE 0.67 0.69 0.64 --  CKTOTAL -- -- -- 131  CKMB -- -- -- 4.0  TROPONINI -- -- -- <0.30   Estimated Creatinine Clearance: 59.7 ml/min (by C-G formula based on Cr of 0.67).   Assessment: INR is 3.15 today. Slightly over goal of 2-3.  Goal of Therapy:  INR 2-3   Plan:  INR is slightly above desired range. Will give 1.5 mg Coumadin today and f/u AM INR.  Eugene Garnet 07/12/2011,3:10 PM

## 2011-07-12 NOTE — Progress Notes (Signed)
Subjective: Pt continues to have very limited activity and refuses to get out of the bed. I had a long discussion with the patient the importance of sitting up in a chair and extend her lungs and her oxygenation is improved.  Objective: Filed Vitals:   07/12/11 0500 07/12/11 0814 07/12/11 1400 07/12/11 1458  BP: 102/58  94/54   Pulse: 66  68   Temp: 98.1 F (36.7 C)  97 F (36.1 C)   TempSrc: Oral  Oral   Resp: 20  17   Height:      Weight:      SpO2: 94% 90% 92% 92%   Weight change:   Intake/Output Summary (Last 24 hours) at 07/12/11 1837 Last data filed at 07/12/11 1257  Gross per 24 hour  Intake   1170 ml  Output      0 ml  Net   1170 ml    General: Alert, awake, oriented x3, in no acute distress. More energetic today. HEENT: [place]/AT [place], EOMI Neck: Trachea midline,  no masses, no thyromegal,y no JVD, no carotid bruit OROPHARYNX:  Moist, No exudate/ erythema/lesions.  Heart: Regular rate and rhythm, without murmurs, rubs, gallops, PMI non-displaced, no heaves or thrills on palpation.  Lungs: Clear to auscultation, no wheezing or rhonchi noted, but has decreased air into to a lung fields.  Abdomen: Soft, nontender, nondistended, positive bowel sounds, no masses no hepatosplenomegaly noted..  Neuro: No focal neurological deficits noted cranial nerves II through XII grossly intact. DTRs 2+ bilaterally upper and lower extremities. Strength functional in bilateral upper and lower extremities.   Lab Results:  Basename 07/12/11 0700 07/11/11 0800  NA 140 139  K 3.5 4.3  CL 100 103  CO2 32 33*  GLUCOSE 173* 97  BUN 15 17  CREATININE 0.67 0.69  CALCIUM 8.9 8.9  MG -- --  PHOS -- --   No results found for this basename: AST:2,ALT:2,ALKPHOS:2,BILITOT:2,PROT:2,ALBUMIN:2 in the last 72 hours No results found for this basename: LIPASE:2,AMYLASE:2 in the last 72 hours No results found for this basename: WBC:2,NEUTROABS:2,HGB:2,HCT:2,MCV:2,PLT:2 in the last 72 hours No  results found for this basename: CKTOTAL:3,CKMB:3,CKMBINDEX:3,TROPONINI:3 in the last 72 hours No components found with this basename: POCBNP:3 No results found for this basename: DDIMER:2 in the last 72 hours No results found for this basename: HGBA1C:2 in the last 72 hours No results found for this basename: CHOL:2,HDL:2,LDLCALC:2,TRIG:2,CHOLHDL:2,LDLDIRECT:2 in the last 72 hours No results found for this basename: TSH,T4TOTAL,FREET3,T3FREE,THYROIDAB in the last 72 hours No results found for this basename: VITAMINB12:2,FOLATE:2,FERRITIN:2,TIBC:2,IRON:2,RETICCTPCT:2 in the last 72 hours  Micro Results: No results found for this or any previous visit (from the past 240 hour(s)).  Studies/Results: Dg Chest 2 View  07/08/2011  *RADIOLOGY REPORT*  Clinical Data: Cough and wheezing.  Left-sided chest pain.  CHEST - 2 VIEW  Comparison: 05/06/2011 and CT chest 06/13/2010.  Findings: Trachea is midline.  Heart size normal.  Lungs are hyperinflated.  Scarring at the left costophrenic angle.  Lungs are otherwise clear.  No pleural fluid.  Osteopenia with multiple vertebroplasties.  Lower thoracic or upper lumbar compression fracture is stable.  IMPRESSION: COPD without acute finding.  Original Report Authenticated By: Reyes Ivan, M.D.    Medications: I have reviewed the patient's current medications. Scheduled Meds:    . azithromycin  500 mg Intravenous QHS  . buPROPion  150 mg Oral Daily  . citalopram  20 mg Oral Daily  . clonazePAM  0.5 mg Oral TID  . fenofibrate  160 mg Oral Daily  . Fluticasone-Salmeterol  1 puff Inhalation Q12H  . folic acid  1 mg Oral Daily  . gabapentin  300 mg Oral QID  . isosorbide mononitrate  30 mg Oral Daily  . levalbuterol  0.63 mg Nebulization Q6H  . lisinopril  5 mg Oral Daily  . metoprolol tartrate  12.5 mg Oral BID  . montelukast  10 mg Oral QHS  . pantoprazole  40 mg Oral Daily  . predniSONE  60 mg Oral Q breakfast  . simvastatin  20 mg Oral q1800    . sodium chloride  3 mL Intravenous Q12H  . tiotropium  18 mcg Inhalation Daily  . warfarin  1.5 mg Oral ONCE-1800  . Warfarin - Pharmacist Dosing Inpatient   Does not apply q1800  . DISCONTD: clonazePAM  1 mg Oral TID  . DISCONTD: methylPREDNISolone (SOLU-MEDROL) injection  60 mg Intravenous Q4H   Continuous Infusions:  PRN Meds:.sodium chloride, levalbuterol, oxyCODONE, oxyCODONE-acetaminophen, sodium chloride Assessment/Plan: Patient Active Hospital Problem List: COPD with acute exacerbation (07/08/2011)   Assessment: We'll transition to by mouth prednisone today. Continue B-agonist, and spiriva.wean oxygen for any sats greater than 89%     SMOKER (05/13/2010)   Assessment: Pt states that she has no intention of quitting smoking.    Chronic diastolic heart failure (07/09/2010)   Assessment: Well compensated   Chest pain (07/09/2011)   Assessment: Resolved. Appreciate cardiology input.  Anxiety    Assessment : The patient is presently on Klonopin  0.5 mg by mouth 3 times a day.     LOS: 4 days

## 2011-07-13 MED ORDER — LEVALBUTEROL HCL 0.63 MG/3ML IN NEBU
0.6300 mg | INHALATION_SOLUTION | Freq: Three times a day (TID) | RESPIRATORY_TRACT | Status: DC
  Administered 2011-07-13 – 2011-07-14 (×2): 0.63 mg via RESPIRATORY_TRACT
  Filled 2011-07-13 (×5): qty 3

## 2011-07-13 MED ORDER — AZITHROMYCIN 500 MG PO TABS
500.0000 mg | ORAL_TABLET | Freq: Every day | ORAL | Status: DC
  Filled 2011-07-13: qty 1

## 2011-07-13 NOTE — Progress Notes (Signed)
ANTICOAGULATION CONSULT NOTE - Follow Up Consult  Pharmacy Consult for Coumadin Indication: h/o DVT and CVA  Allergies  Allergen Reactions  . Aspirin   . Penicillins Hives and Itching    Patient Measurements: Height: 5\' 4"  (162.6 cm) Weight: 133 lb 1.6 oz (60.374 kg) IBW/kg (Calculated) : 54.7  Heparin Dosing Weight:   Vital Signs: Temp: 98 F (36.7 C) (03/25 0500) Temp src: Oral (03/25 0500) BP: 94/55 mmHg (03/25 0500) Pulse Rate: 60  (03/25 0500)  Labs:  Basename 07/13/11 0857 07/12/11 0700 07/11/11 0800  HGB -- -- --  HCT -- -- --  PLT -- -- --  APTT -- -- --  LABPROT 39.1* 32.8* 28.5*  INR 3.94* 3.15* 2.63*  HEPARINUNFRC -- -- --  CREATININE -- 0.67 0.69  CKTOTAL -- -- --  CKMB -- -- --  TROPONINI -- -- --   Estimated Creatinine Clearance: 59.7 ml/min (by C-G formula based on Cr of 0.67).   Assessment: INR is 3.94 today. INR > goal of 2-3. No bleeding reported. Goal of Therapy:  INR 2-3   Plan:  No coumadin today.  F/u am INR. Aimee Sanders T 07/13/2011,10:17 AM

## 2011-07-13 NOTE — Progress Notes (Signed)
Subjective: Pt sitting up in chair today. States that she feels well but not well enough to go home. Oxygen has been weaned down to 3 L which is within the range of patient's oxygen at home.  Objective: Filed Vitals:   07/12/11 2100 07/13/11 0500 07/13/11 1036 07/13/11 1040  BP: 100/59 94/55 102/46   Pulse: 72 60 59   Temp: 97.8 F (36.6 C) 98 F (36.7 C)    TempSrc: Oral Oral    Resp: 18 18    Height:      Weight:      SpO2: 94% 94%  95%   Weight change:   Intake/Output Summary (Last 24 hours) at 07/13/11 1413 Last data filed at 07/13/11 0820  Gross per 24 hour  Intake    850 ml  Output      0 ml  Net    850 ml    General: Alert, awake, oriented x3, in no acute distress. More energetic today. HEENT: [place]/AT [place], EOMI Neck: Trachea midline,  no masses, no thyromegal,y no JVD, no carotid bruit OROPHARYNX:  Moist, No exudate/ erythema/lesions.  Heart: Regular rate and rhythm, without murmurs, rubs, gallops, PMI non-displaced, no heaves or thrills on palpation.  Lungs: Clear to auscultation, no wheezing or rhonchi noted, better air entry lung fields today Abdomen: Soft, nontender, nondistended, positive bowel sounds, no masses no hepatosplenomegaly noted..   Lab Results:  Basename 07/12/11 0700 07/11/11 0800  NA 140 139  K 3.5 4.3  CL 100 103  CO2 32 33*  GLUCOSE 173* 97  BUN 15 17  CREATININE 0.67 0.69  CALCIUM 8.9 8.9  MG -- --  PHOS -- --   No results found for this basename: AST:2,ALT:2,ALKPHOS:2,BILITOT:2,PROT:2,ALBUMIN:2 in the last 72 hours No results found for this basename: LIPASE:2,AMYLASE:2 in the last 72 hours No results found for this basename: WBC:2,NEUTROABS:2,HGB:2,HCT:2,MCV:2,PLT:2 in the last 72 hours No results found for this basename: CKTOTAL:3,CKMB:3,CKMBINDEX:3,TROPONINI:3 in the last 72 hours No components found with this basename: POCBNP:3 No results found for this basename: DDIMER:2 in the last 72 hours No results found for this basename:  HGBA1C:2 in the last 72 hours No results found for this basename: CHOL:2,HDL:2,LDLCALC:2,TRIG:2,CHOLHDL:2,LDLDIRECT:2 in the last 72 hours No results found for this basename: TSH,T4TOTAL,FREET3,T3FREE,THYROIDAB in the last 72 hours No results found for this basename: VITAMINB12:2,FOLATE:2,FERRITIN:2,TIBC:2,IRON:2,RETICCTPCT:2 in the last 72 hours  Micro Results: No results found for this or any previous visit (from the past 240 hour(s)).  Studies/Results: Dg Chest 2 View  07/08/2011  *RADIOLOGY REPORT*  Clinical Data: Cough and wheezing.  Left-sided chest pain.  CHEST - 2 VIEW  Comparison: 05/06/2011 and CT chest 06/13/2010.  Findings: Trachea is midline.  Heart size normal.  Lungs are hyperinflated.  Scarring at the left costophrenic angle.  Lungs are otherwise clear.  No pleural fluid.  Osteopenia with multiple vertebroplasties.  Lower thoracic or upper lumbar compression fracture is stable.  IMPRESSION: COPD without acute finding.  Original Report Authenticated By: Reyes Ivan, M.D.    Medications: I have reviewed the patient's current medications. Scheduled Meds:    . azithromycin  500 mg Oral QHS  . buPROPion  150 mg Oral Daily  . citalopram  20 mg Oral Daily  . clonazePAM  0.5 mg Oral TID  . fenofibrate  160 mg Oral Daily  . Fluticasone-Salmeterol  1 puff Inhalation Q12H  . folic acid  1 mg Oral Daily  . gabapentin  300 mg Oral QID  . isosorbide mononitrate  30  mg Oral Daily  . levalbuterol  0.63 mg Nebulization Q6H  . lisinopril  5 mg Oral Daily  . metoprolol tartrate  12.5 mg Oral BID  . montelukast  10 mg Oral QHS  . pantoprazole  40 mg Oral Daily  . predniSONE  60 mg Oral Q breakfast  . simvastatin  20 mg Oral q1800  . sodium chloride  3 mL Intravenous Q12H  . tiotropium  18 mcg Inhalation Daily  . warfarin  1.5 mg Oral ONCE-1800  . Warfarin - Pharmacist Dosing Inpatient   Does not apply q1800  . DISCONTD: azithromycin  500 mg Intravenous QHS   Continuous  Infusions:  PRN Meds:.sodium chloride, levalbuterol, oxyCODONE, oxyCODONE-acetaminophen, sodium chloride Assessment/Plan: Patient Active Hospital Problem List: COPD with acute exacerbation (07/08/2011)   Assessment: Continue prednisone taper . Continue B-agonist, and spiriva.wean oxygen for any sats greater than 89%. Discontinue it azithromycin after today's dose.     SMOKER (05/13/2010)   Assessment: Pt states that she has no intention of quitting smoking.    Chronic diastolic heart failure (07/09/2010)   Assessment: Well compensated   Chest pain (07/09/2011)   Assessment: Resolved. Appreciate cardiology input.  Anxiety    Assessment : The patient is presently on Klonopin  0.5 mg by mouth 3 times a day.     LOS: 5 days

## 2011-07-13 NOTE — Progress Notes (Signed)
PHARMACIST - PHYSICIAN COMMUNICATION DR:  Internal Medicine CONCERNING: Antibiotic IV to Oral Route Change Policy  RECOMMENDATION: This patient is receiving azithromycin by the intravenous route.  Based on criteria approved by the Pharmacy and Therapeutics Committee, the antibiotic(s) is/are being converted to the equivalent oral dose form(s).   DESCRIPTION: These criteria include:  Patient being treated for a respiratory tract infection, urinary tract infection, or cellulitis  The patient is not neutropenic and does not exhibit a GI malabsorption state  The patient is eating (either orally or via tube) and/or has been taking other orally administered medications for a least 24 hours  The patient is improving clinically and has a Tmax < 100.5  If you have questions about this conversion, please contact the Pharmacy Department  []   [phone] )  Aimee Sanders [x]   [phone] )  Aimee Sanders  []   [phone] )  Aimee Sanders []   [phone] )  Aimee Sanders    Herby Abraham, [place].D. [phone] 07/13/2011 10:15 AM

## 2011-07-14 MED ORDER — WARFARIN 0.5 MG HALF TABLET
1.5000 mg | ORAL_TABLET | Freq: Once | ORAL | Status: DC
  Filled 2011-07-14: qty 1

## 2011-07-14 MED ORDER — WARFARIN SODIUM 3 MG PO TABS: 1.5000 mg | ORAL_TABLET | Freq: Every day | ORAL | Status: DC

## 2011-07-14 MED ORDER — PREDNISONE 1 MG PO TABS: ORAL_TABLET | ORAL | Status: DC

## 2011-07-14 MED ORDER — CLONAZEPAM 0.5 MG PO TABS: 0.5000 mg | ORAL_TABLET | Freq: Three times a day (TID) | ORAL | Status: DC

## 2011-07-14 NOTE — Discharge Summary (Signed)
Aimee Sanders MRN: [id number] DOB/AGE: 08-15-1945 66 y.o.  Admit date: 07/08/2011 Discharge date: 07/14/2011  Primary Care Physician:  Bennie Pierini, FNP, FNP   Discharge Diagnoses:   Patient Active Problem List  Diagnoses  . HYPERLIPIDEMIA  . SMOKER  . MYOCARDIAL INFARCTION  . C V A / STROKE  . DEEP VEIN THROMBOSIS/PHLEBITIS  . COPD UNSPECIFIED  . RESPIRATORY FAILURE, CHRONIC  . SLEEP APNEA  . Chronic diastolic heart failure  . Peripheral vascular disease  . Pericardial effusion  . COPD with acute exacerbation  . Chest pain    DISCHARGE MEDICATION: Medication List  As of 07/14/2011  7:29 PM   TAKE these medications         ADVAIR DISKUS 250-50 MCG/DOSE Aepb   Generic drug: Fluticasone-Salmeterol   Inhale 1 puff into the lungs every 12 (twelve) hours.      albuterol (2.5 MG/3ML) 0.083% nebulizer solution   Commonly known as: PROVENTIL   Take 2.5 mg by nebulization every 6 (six) hours as needed. For wheezing/shortness of breath      atorvastatin 20 MG tablet   Commonly known as: LIPITOR   Take 20 mg by mouth at bedtime.      buPROPion 150 MG 24 hr tablet   Commonly known as: WELLBUTRIN XL   Take 150 mg by mouth daily. Use as directed      citalopram 20 MG tablet   Commonly known as: CELEXA   Take 20 mg by mouth daily.      clonazePAM 0.5 MG tablet   Commonly known as: KLONOPIN   Take 1 tablet (0.5 mg total) by mouth 3 (three) times daily.      fenofibrate 160 MG tablet   Take 160 mg by mouth daily.      folic acid 1 MG tablet   Commonly known as: FOLVITE   Take 1 mg by mouth daily.      gabapentin 300 MG capsule   Commonly known as: NEURONTIN   Take 300 mg by mouth 4 (four) times daily.      isosorbide mononitrate 30 MG 24 hr tablet   Commonly known as: IMDUR   Take 30 mg by mouth daily.      lisinopril 5 MG tablet   Commonly known as: PRINIVIL,ZESTRIL   Take 5 mg by mouth daily.      metoprolol tartrate 25 MG tablet   Commonly known as:  LOPRESSOR   Take 12.5 mg by mouth 2 (two) times daily.      montelukast 10 MG tablet   Commonly known as: SINGULAIR   Take 10 mg by mouth at bedtime.      nitroGLYCERIN 0.4 MG SL tablet   Commonly known as: NITROSTAT   Place 0.4 mg under the tongue every 5 (five) minutes as needed. For chest pain      oxyCODONE-acetaminophen 10-325 MG per tablet   Commonly known as: PERCOCET   Take 1 tablet by mouth every 8 (eight) hours as needed. For pain      OXYGEN-HELIUM IN   Inhale into the lungs continuous. 2 liters /      pantoprazole 40 MG tablet   Commonly known as: PROTONIX   Take 40 mg by mouth daily.      predniSONE 1 MG tablet   Commonly known as: DELTASONE   40 mg PO daily x 2, then 30 mg PO daily x 2, then 20 mg PO daily x 2 then, 10 mg PO daily x  2 then stop.      tiotropium 18 MCG inhalation capsule   Commonly known as: SPIRIVA   Place 18 mcg into inhaler and inhale daily.      warfarin 3 MG tablet   Commonly known as: COUMADIN   Take 0.5-1 tablets (1.5-3 mg total) by mouth daily. Take 1.5 mg tonight 07/14/2011, then starting 07/15/2011, take 1.5mg  on M,W,F and 3 mg on T,TH,Sa,Sun.              Consults:     SIGNIFICANT DIAGNOSTIC STUDIES:  Dg Chest 2 View  07/08/2011  *RADIOLOGY REPORT*  Clinical Data: Cough and wheezing.  Left-sided chest pain.  CHEST - 2 VIEW  Comparison: 05/06/2011 and CT chest 06/13/2010.  Findings: Trachea is midline.  Heart size normal.  Lungs are hyperinflated.  Scarring at the left costophrenic angle.  Lungs are otherwise clear.  No pleural fluid.  Osteopenia with multiple vertebroplasties.  Lower thoracic or upper lumbar compression fracture is stable.  IMPRESSION: COPD without acute finding.  Original Report Authenticated By: Reyes Ivan, M.D.        BRIEF ADMITTING H & P: Aimee Sanders is an 66 y.o. female with history of severe COPD on 3 L home O2, unfortunately an active tobacco use, known coronary disease status post prior  MI x2, history of congestive heart failure, DVT on chronic anticoagulation, history of sleep apnea, presents to the emergency room with 5 days history of upper respiratory symptoms, and progressively more short of breath. She did not have any chest pain, nausea vomiting. In the emergency room she was given multiple neb treatment without improvement of her symptoms. Chest x-ray showed osteopenia, emphysema, but no acute process otherwise. She was noted to have normal renal function, white count, hemoglobin, and negative cardiac markers. Her INR was elevated at 3. 60. Hospitalist was asked to admit her for COPD exacerbation. I asked the IV Zithromax be started.    Hospital Course:  Present on Admission:  . Acute on chronic respiratory failure: This is a patient who is oxygen dependent at 2 L per minute to 3 L per minutes around-the-clock at home. The patient presented to the hospital with an acute exacerbation of COPD causing her to have an acute on chronic respiratory failure. Her initial oxygen requirement upon arrival to the hospital was 5-6 L of oxygen. By the time of discharge the patient was down to 3 L of oxygen. The patient was treated with initially IV Solu-Medrol nebulized beta agonists, nebulized Atrovent  and azithromycin. She completed her course of antibiotics. She was then transitioned over to oral steroids and is being sent home on a tapering dose of steroids. Additionally the patient is resumed on her nebulized Atrovent and albuterol home. The patient has very low activity tolerance and becomes very short of breath with minimal activity which per the patient and her daughter is her baseline. At time of discharge the patient was back to her baseline tolerating sitting up in a chair and ambulating for short distances. The patient is being discharged home with home health physical therapy to help to build her tolerance to activity and endurance.  . DVT on chronic anticoagulation: The patient is  on chronic anticoagulation. Upon arrival to the hospital her INR was elevated. Her medications have been titrated and she is being sent home on the above regimen of Coumadin. The patient has been instructed 1.5 mg tonight and then resume 1.5 mg on Wednesday Monday and Friday and 3  mg on all of the evenings. She is to have her INR checked on Friday [facility]. [facility] and any further titration made by the prescribing pharmacist at her physician's office.  . Chest pain: The patient has a history of coronary artery disease status post stent placement. On this admission complaining of chest pain. Her enzymes were cycled and found to be negative. She was seen in consultation by cardiology and they recommended conservative medical management. The patient's chest pain resolved and she had no further chest pain during this hospitalization. She's to followup with Dr. Natasha Bence for further cardiac management.  Marland KitchenCOPD with acute exacerbation: See above   .SMOKER: The patient is an active smoker even while being on oxygen around-the-clock. The patient states that at her age she has no intention of quitting smoking.   . Anxiety: Patient has a history of anxiety and has been on Klonopin 1 mg by mouth 3 times a day. During this hospitalization the patient was found to be rather somnolent in the Klonopin has been decreased to 0.5 mg by mouth 3 times a day.   Disposition and Follow-up:   Patient is to followup with Bennie Pierini at [facility] family medicine and with Dr. Andee Lineman as needed for cardiac followup. Discharge Orders    Future Orders Please Complete By Expires   Diet - low sodium heart healthy      Discharge instructions      Comments:   Wear 2.5 L/min of Oxygen around the clock.   Activity as tolerated - No restrictions         DISCHARGE EXAM:  General: Alert, awake, oriented x3, in no acute distress. More energetic today. \ Vital signs: Blood pressure 117/66, pulse 57,  temperature 98.2 F (36.8 C), temperature source Oral, resp. rate 18, height 5\' 4"  (1.626 m), weight 60.374 kg (133 lb 1.6 oz), SpO2 94.00%. HEENT: [place]/AT PEERL, EOMI  Neck: Trachea midline, no masses, no thyromegal,y no JVD, no carotid bruit  OROPHARYNX: Moist, No exudate/ erythema/lesions.  Heart: Regular rate and rhythm, without murmurs, rubs, gallops, PMI non-displaced, no heaves or thrills on palpation.  Lungs: Clear to auscultation, no wheezing or rhonchi noted, better air entry lung fields today  Abdomen: Soft, nontender, nondistended, positive bowel sounds, no masses no hepatosplenomegaly noted   Basename 07/12/11 0700  NA 140  K 3.5  CL 100  CO2 32  GLUCOSE 173*  BUN 15  CREATININE 0.67  CALCIUM 8.9  MG --  PHOS --   No results found for this basename: AST:2,ALT:2,ALKPHOS:2,BILITOT:2,PROT:2,ALBUMIN:2 in the last 72 hours No results found for this basename: LIPASE:2,AMYLASE:2 in the last 72 hours No results found for this basename: WBC:2,NEUTROABS:2,HGB:2,HCT:2,MCV:2,PLT:2 in the last 72 hours  Total time for discharge process including face-to-face time approximately 42 minutes  Signed: Hykeem Ojeda A. 07/14/2011, 7:29 PM

## 2011-07-14 NOTE — Progress Notes (Signed)
ANTICOAGULATION CONSULT NOTE - Follow Up Consult  Pharmacy Consult for Coumadin Indication: h/o DVT and CVA  Allergies  Allergen Reactions  . Aspirin   . Penicillins Hives and Itching   Admit Complaint: 66 y.o.  female  admitted 07/08/2011 with progressive SOB.  Pharmacy consulted to dose warfarin for history of DVT.  Home warfarin dose: 1.5 mg Monday and Friday, 3 mg other days of week, INR 3.61 on admission  Overnight Events: 07/14/2011  Assessment: Anticoagulation: History of DVT, INR labile on home dose over course of admission, dose held 3/25.  No bleeding noted, last CBC 3/21 Infectious Disease: Pneumonia, azithromycin course completed 3/25 Cardiovascular: CAD, CHF, Stroke, hyperlipidemia, enzymes negative this admission cards did not recommend further inpatient workup.   IMDUR, statin, fenofibrate, lisinopril, metoprolol: BP and HR at goal on home regimen Gastrointestinal / Nutrition: Protonix Neurology: Anxiety: clonazepam, gabapentin, brpropion, celexa 20 daily Pulmonary: COPD, OSA on home O2: singulair, xopenex, spiriva, prednisone 60 mg daily,  Hematology / Oncology: Folic acid PTA Medication Issues: Home Meds Not Ordered: albuterol nebs Best Practices: DVT Prophylaxis: INR at goal  Goal of Therapy:  INR 2-3   Plan:  Warfarin 1.5 mg today F/u am INR.  Patient Measurements: Height: 5\' 4"  (162.6 cm) Weight: 133 lb 1.6 oz (60.374 kg) IBW/kg (Calculated) : 54.7  Heparin Dosing Weight:   Vital Signs: Temp: 98.2 F (36.8 C) (03/26 0500) Temp src: Oral (03/26 0500) BP: 117/66 mmHg (03/26 0500) Pulse Rate: 57  (03/26 0500)  Labs:  Basename 07/14/11 0550 07/13/11 0857 07/12/11 0700  HGB -- -- --  HCT -- -- --  PLT -- -- --  APTT -- -- --  LABPROT 30.6* 39.1* 32.8*  INR 2.88* 3.94* 3.15*  HEPARINUNFRC -- -- --  CREATININE -- -- 0.67  CKTOTAL -- -- --  CKMB -- -- --  TROPONINI -- -- --   Estimated Creatinine Clearance: 59.7 ml/min (by C-G formula based on  Cr of 0.67).  Denis Carreon Christine Virginia Crews 07/14/2011,10:31 AM

## 2011-09-12 ENCOUNTER — Emergency Department (HOSPITAL_COMMUNITY): Payer: Medicare Other

## 2011-09-12 ENCOUNTER — Inpatient Hospital Stay (HOSPITAL_COMMUNITY): Payer: Medicare Other

## 2011-09-12 ENCOUNTER — Other Ambulatory Visit: Payer: Self-pay

## 2011-09-12 ENCOUNTER — Inpatient Hospital Stay (HOSPITAL_COMMUNITY)
Admission: EM | Admit: 2011-09-12 | Discharge: 2011-09-16 | DRG: 189 | Disposition: A | Discharge status: Home / Self Care | Payer: Medicare Other | Attending: Pulmonary Disease | Admitting: Pulmonary Disease

## 2011-09-12 DIAGNOSIS — J440 Chronic obstructive pulmonary disease with acute lower respiratory infection: Secondary | ICD-10-CM | POA: Diagnosis present

## 2011-09-12 DIAGNOSIS — Z79899 Other long term (current) drug therapy: Secondary | ICD-10-CM

## 2011-09-12 DIAGNOSIS — I219 Acute myocardial infarction, unspecified: Secondary | ICD-10-CM | POA: Insufficient documentation

## 2011-09-12 DIAGNOSIS — Z7901 Long term (current) use of anticoagulants: Secondary | ICD-10-CM

## 2011-09-12 DIAGNOSIS — B965 Pseudomonas (aeruginosa) (mallei) (pseudomallei) as the cause of diseases classified elsewhere: Secondary | ICD-10-CM | POA: Diagnosis present

## 2011-09-12 DIAGNOSIS — R5381 Other malaise: Secondary | ICD-10-CM | POA: Diagnosis not present

## 2011-09-12 DIAGNOSIS — Z8673 Personal history of transient ischemic attack (TIA), and cerebral infarction without residual deficits: Secondary | ICD-10-CM

## 2011-09-12 DIAGNOSIS — J449 Chronic obstructive pulmonary disease, unspecified: Secondary | ICD-10-CM

## 2011-09-12 DIAGNOSIS — J9620 Acute and chronic respiratory failure, unspecified whether with hypoxia or hypercapnia: Principal | ICD-10-CM | POA: Diagnosis present

## 2011-09-12 DIAGNOSIS — R0603 Acute respiratory distress: Secondary | ICD-10-CM

## 2011-09-12 DIAGNOSIS — J441 Chronic obstructive pulmonary disease with (acute) exacerbation: Secondary | ICD-10-CM

## 2011-09-12 DIAGNOSIS — G4733 Obstructive sleep apnea (adult) (pediatric): Secondary | ICD-10-CM | POA: Diagnosis present

## 2011-09-12 DIAGNOSIS — J209 Acute bronchitis, unspecified: Secondary | ICD-10-CM | POA: Diagnosis present

## 2011-09-12 DIAGNOSIS — G4730 Sleep apnea, unspecified: Secondary | ICD-10-CM | POA: Insufficient documentation

## 2011-09-12 DIAGNOSIS — I5032 Chronic diastolic (congestive) heart failure: Secondary | ICD-10-CM | POA: Diagnosis present

## 2011-09-12 DIAGNOSIS — J4489 Other specified chronic obstructive pulmonary disease: Secondary | ICD-10-CM | POA: Insufficient documentation

## 2011-09-12 DIAGNOSIS — F17200 Nicotine dependence, unspecified, uncomplicated: Secondary | ICD-10-CM | POA: Diagnosis present

## 2011-09-12 DIAGNOSIS — E785 Hyperlipidemia, unspecified: Secondary | ICD-10-CM

## 2011-09-12 DIAGNOSIS — Z9861 Coronary angioplasty status: Secondary | ICD-10-CM

## 2011-09-12 DIAGNOSIS — IMO0002 Reserved for concepts with insufficient information to code with codable children: Secondary | ICD-10-CM

## 2011-09-12 DIAGNOSIS — E876 Hypokalemia: Secondary | ICD-10-CM | POA: Diagnosis present

## 2011-09-12 DIAGNOSIS — Z86718 Personal history of other venous thrombosis and embolism: Secondary | ICD-10-CM

## 2011-09-12 DIAGNOSIS — I739 Peripheral vascular disease, unspecified: Secondary | ICD-10-CM | POA: Diagnosis present

## 2011-09-12 DIAGNOSIS — J9610 Chronic respiratory failure, unspecified whether with hypoxia or hypercapnia: Secondary | ICD-10-CM

## 2011-09-12 DIAGNOSIS — Z9981 Dependence on supplemental oxygen: Secondary | ICD-10-CM

## 2011-09-12 DIAGNOSIS — I252 Old myocardial infarction: Secondary | ICD-10-CM

## 2011-09-12 DIAGNOSIS — R6251 Failure to thrive (child): Secondary | ICD-10-CM

## 2011-09-12 DIAGNOSIS — I313 Pericardial effusion (noninflammatory): Secondary | ICD-10-CM

## 2011-09-12 DIAGNOSIS — R627 Adult failure to thrive: Secondary | ICD-10-CM | POA: Diagnosis not present

## 2011-09-12 DIAGNOSIS — I509 Heart failure, unspecified: Secondary | ICD-10-CM | POA: Diagnosis present

## 2011-09-12 DIAGNOSIS — I2510 Atherosclerotic heart disease of native coronary artery without angina pectoris: Secondary | ICD-10-CM | POA: Diagnosis present

## 2011-09-12 DIAGNOSIS — I6789 Other cerebrovascular disease: Secondary | ICD-10-CM

## 2011-09-12 DIAGNOSIS — I80299 Phlebitis and thrombophlebitis of other deep vessels of unspecified lower extremity: Secondary | ICD-10-CM | POA: Insufficient documentation

## 2011-09-12 DIAGNOSIS — R0609 Other forms of dyspnea: Secondary | ICD-10-CM

## 2011-09-12 HISTORY — DX: Other chronic pain: G89.29

## 2011-09-12 LAB — BASIC METABOLIC PANEL
BUN: 6 mg/dL (ref 6–23)
CO2: >45 mEq/L (ref 19–32)
Calcium: 9 mg/dL (ref 8.4–10.5)
Chloride: 92 mEq/L — ABNORMAL LOW (ref 96–112)
Creatinine, Ser: 0.78 mg/dL (ref 0.50–1.10)
GFR calc Af Amer: >90 mL/min (ref >90)
GFR calc non Af Amer: 85 mL/min — ABNORMAL LOW (ref >90)
Glucose, Bld: 130 mg/dL — ABNORMAL HIGH (ref 70–99)
Potassium: 3.3 mEq/L — ABNORMAL LOW (ref 3.5–5.1)
Sodium: 140 mEq/L (ref 135–145)

## 2011-09-12 LAB — COMPREHENSIVE METABOLIC PANEL
BUN: 7 mg/dL (ref 6–23)
CO2: 44 mEq/L (ref 19–32)
Calcium: 9.4 mg/dL (ref 8.4–10.5)
Creatinine, Ser: 0.66 mg/dL (ref 0.50–1.10)
GFR calc Af Amer: >90 mL/min (ref >90)
GFR calc non Af Amer: 89 mL/min — ABNORMAL LOW (ref >90)
Glucose, Bld: 148 mg/dL — ABNORMAL HIGH (ref 70–99)

## 2011-09-12 LAB — CBC
HCT: 40.4 % (ref 36.0–46.0)
Hemoglobin: 12.8 g/dL (ref 12.0–15.0)
MCH: 30 pg (ref 26.0–34.0)
MCHC: 31.7 g/dL (ref 30.0–36.0)
MCHC: 31 g/dL (ref 30.0–36.0)
MCV: 94.8 fL (ref 78.0–100.0)
Platelets: 300 10*3/uL (ref 150–400)
Platelets: 282 10*3/uL (ref 150–400)
RBC: 4.26 MIL/uL (ref 3.87–5.11)
RDW: 14.4 % (ref 11.5–15.5)
RDW: 14.2 % (ref 11.5–15.5)
WBC: 11.7 10*3/uL — ABNORMAL HIGH (ref 4.0–10.5)
WBC: 7.7 10*3/uL (ref 4.0–10.5)

## 2011-09-12 LAB — CARDIAC PANEL(CRET KIN+CKTOT+MB+TROPI)
Relative Index: INVALID (ref 0.0–2.5)
Total CK: 54 U/L (ref 7–177)
Troponin I: <0.3 ng/mL (ref <0.30)

## 2011-09-12 LAB — PROTIME-INR
INR: 4.41 — ABNORMAL HIGH (ref 0.00–1.49)
Prothrombin Time: 42.7 seconds — ABNORMAL HIGH (ref 11.6–15.2)

## 2011-09-12 LAB — POCT I-STAT 3, ART BLOOD GAS (G3+)
Acid-Base Excess: 17 mmol/L — ABNORMAL HIGH (ref 0.0–2.0)
Bicarbonate: 48.6 mEq/L — ABNORMAL HIGH (ref 20.0–24.0)
TCO2: >50 mmol/L (ref 0–100)
pCO2 arterial: 86.1 mmHg (ref 35.0–45.0)
pH, Arterial: 7.286 — ABNORMAL LOW (ref 7.350–7.400)
pH, Arterial: 7.36 (ref 7.350–7.400)
pO2, Arterial: 64 mmHg — ABNORMAL LOW (ref 80.0–100.0)

## 2011-09-12 LAB — BLOOD GAS, ARTERIAL
Acid-Base Excess: 15.9 mmol/L — ABNORMAL HIGH (ref 0.0–2.0)
Bicarbonate: 41.8 mEq/L — ABNORMAL HIGH (ref 20.0–24.0)
Delivery systems: POSITIVE
FIO2: 0.5 %
Patient temperature: 98.7
TCO2: 43.9 mmol/L (ref 0–100)
pH, Arterial: 7.389 (ref 7.350–7.400)

## 2011-09-12 LAB — DIFFERENTIAL
Basophils Absolute: 0.1 10*3/uL (ref 0.0–0.1)
Basophils Relative: 0 % (ref 0–1)
Eosinophils Absolute: 0.1 10*3/uL (ref 0.0–0.7)
Eosinophils Relative: 1 % (ref 0–5)
Lymphocytes Relative: 39 % (ref 12–46)
Lymphs Abs: 4.5 10*3/uL — ABNORMAL HIGH (ref 0.7–4.0)
Monocytes Absolute: 1.3 10*3/uL — ABNORMAL HIGH (ref 0.1–1.0)
Monocytes Relative: 11 % (ref 3–12)
Neutro Abs: 5.6 10*3/uL (ref 1.7–7.7)
Neutrophils Relative %: 48 % (ref 43–77)

## 2011-09-12 LAB — CORTISOL: Cortisol, Plasma: 11.6 ug/dL

## 2011-09-12 LAB — URINALYSIS, ROUTINE W REFLEX MICROSCOPIC
Bilirubin Urine: NEGATIVE
Hgb urine dipstick: NEGATIVE
Nitrite: NEGATIVE
Protein, ur: NEGATIVE mg/dL
Specific Gravity, Urine: 1.022 (ref 1.005–1.030)
Urobilinogen, UA: 0.2 mg/dL (ref 0.0–1.0)

## 2011-09-12 LAB — URINE MICROSCOPIC-ADD ON

## 2011-09-12 LAB — MAGNESIUM: Magnesium: 1.7 mg/dL (ref 1.5–2.5)

## 2011-09-12 LAB — PRO B NATRIURETIC PEPTIDE: Pro B Natriuretic peptide (BNP): 663 pg/mL — ABNORMAL HIGH (ref 0–125)

## 2011-09-12 LAB — LACTIC ACID, PLASMA: Lactic Acid, Venous: 1 mmol/L (ref 0.5–2.2)

## 2011-09-12 LAB — PHOSPHORUS: Phosphorus: 2.2 mg/dL — ABNORMAL LOW (ref 2.3–4.6)

## 2011-09-12 LAB — MRSA PCR SCREENING: MRSA by PCR: NEGATIVE

## 2011-09-12 LAB — TROPONIN I: Troponin I: <0.3 ng/mL (ref <0.30)

## 2011-09-12 MED ORDER — SODIUM CHLORIDE 0.9 % IV SOLN
INTRAVENOUS | Status: DC
  Administered 2011-09-12 (×2): via INTRAVENOUS

## 2011-09-12 MED ORDER — DEXAMETHASONE SODIUM PHOSPHATE 10 MG/ML IJ SOLN
10.0000 mg | Freq: Once | INTRAMUSCULAR | Status: AC
  Administered 2011-09-12: 10 mg via INTRAVENOUS
  Filled 2011-09-12: qty 1

## 2011-09-12 MED ORDER — FENOFIBRATE 160 MG PO TABS
160.0000 mg | ORAL_TABLET | Freq: Every day | ORAL | Status: DC
  Administered 2011-09-12 – 2011-09-16 (×5): 160 mg via ORAL
  Filled 2011-09-12 (×5): qty 1

## 2011-09-12 MED ORDER — ALBUTEROL SULFATE (5 MG/ML) 0.5% IN NEBU
INHALATION_SOLUTION | RESPIRATORY_TRACT | Status: AC
  Administered 2011-09-12: 10 mg via RESPIRATORY_TRACT
  Filled 2011-09-12: qty 2

## 2011-09-12 MED ORDER — METHYLPREDNISOLONE SODIUM SUCC 40 MG IJ SOLR
40.0000 mg | Freq: Three times a day (TID) | INTRAMUSCULAR | Status: DC
  Administered 2011-09-12 – 2011-09-13 (×2): 40 mg via INTRAVENOUS
  Filled 2011-09-12 (×5): qty 1

## 2011-09-12 MED ORDER — MOXIFLOXACIN HCL IN NACL 400 MG/250ML IV SOLN
400.0000 mg | INTRAVENOUS | Status: DC
  Administered 2011-09-12 – 2011-09-14 (×3): 400 mg via INTRAVENOUS
  Filled 2011-09-12 (×4): qty 250

## 2011-09-12 MED ORDER — METOPROLOL TARTRATE 12.5 MG HALF TABLET
12.5000 mg | ORAL_TABLET | Freq: Two times a day (BID) | ORAL | Status: DC
  Administered 2011-09-12 – 2011-09-13 (×4): 12.5 mg via ORAL
  Filled 2011-09-12 (×6): qty 1

## 2011-09-12 MED ORDER — PANTOPRAZOLE SODIUM 40 MG IV SOLR
40.0000 mg | Freq: Every day | INTRAVENOUS | Status: DC
  Administered 2011-09-12: 40 mg via INTRAVENOUS
  Filled 2011-09-12 (×2): qty 40

## 2011-09-12 MED ORDER — FUROSEMIDE 20 MG PO TABS
20.0000 mg | ORAL_TABLET | ORAL | Status: DC
  Administered 2011-09-14: 20 mg via ORAL
  Filled 2011-09-12 (×2): qty 1

## 2011-09-12 MED ORDER — METHYLPREDNISOLONE SODIUM SUCC 125 MG IJ SOLR
60.0000 mg | Freq: Three times a day (TID) | INTRAMUSCULAR | Status: DC
  Administered 2011-09-12: 60 mg via INTRAVENOUS
  Filled 2011-09-12: qty 2

## 2011-09-12 MED ORDER — ALBUTEROL (5 MG/ML) CONTINUOUS INHALATION SOLN
10.0000 mg/h | INHALATION_SOLUTION | RESPIRATORY_TRACT | Status: AC
  Administered 2011-09-12: 10 mg via RESPIRATORY_TRACT

## 2011-09-12 MED ORDER — CITALOPRAM HYDROBROMIDE 20 MG PO TABS
20.0000 mg | ORAL_TABLET | Freq: Every day | ORAL | Status: DC
  Administered 2011-09-12 – 2011-09-16 (×5): 20 mg via ORAL
  Filled 2011-09-12 (×5): qty 1

## 2011-09-12 MED ORDER — ALBUTEROL SULFATE (5 MG/ML) 0.5% IN NEBU
2.5000 mg | INHALATION_SOLUTION | Freq: Four times a day (QID) | RESPIRATORY_TRACT | Status: DC
  Administered 2011-09-12 – 2011-09-13 (×4): 2.5 mg via RESPIRATORY_TRACT
  Filled 2011-09-12 (×4): qty 0.5

## 2011-09-12 MED ORDER — BUPROPION HCL ER (XL) 150 MG PO TB24
150.0000 mg | ORAL_TABLET | Freq: Every day | ORAL | Status: DC
  Administered 2011-09-12 – 2011-09-16 (×5): 150 mg via ORAL
  Filled 2011-09-12 (×5): qty 1

## 2011-09-12 MED ORDER — IPRATROPIUM BROMIDE 0.02 % IN SOLN
0.5000 mg | Freq: Four times a day (QID) | RESPIRATORY_TRACT | Status: DC
  Administered 2011-09-12 – 2011-09-13 (×4): 0.5 mg via RESPIRATORY_TRACT
  Filled 2011-09-12 (×4): qty 2.5

## 2011-09-12 MED ORDER — ALBUTEROL SULFATE (5 MG/ML) 0.5% IN NEBU
2.5000 mg | INHALATION_SOLUTION | RESPIRATORY_TRACT | Status: DC | PRN
  Administered 2011-09-13 – 2011-09-16 (×3): 2.5 mg via RESPIRATORY_TRACT
  Filled 2011-09-12: qty 0.5

## 2011-09-12 MED ORDER — ISOSORBIDE MONONITRATE ER 30 MG PO TB24
30.0000 mg | ORAL_TABLET | Freq: Every day | ORAL | Status: DC
  Administered 2011-09-12 – 2011-09-13 (×2): 30 mg via ORAL
  Filled 2011-09-12 (×3): qty 1

## 2011-09-12 NOTE — ED Notes (Signed)
Nida Boatman (son) [phone] Misty Stanley (daughter) [phone]

## 2011-09-12 NOTE — Progress Notes (Signed)
09/12/11 1108  OTHER  CSW Follow Up Status Follow-up required     Please call unit based LCSW if needs or concerns arise.

## 2011-09-12 NOTE — H&P (Signed)
Name: Aimee Sanders MRN: [id number] DOB: 10/20/1945    LOS: 0  Referring Provider:  EDP Reason for Referral:  Hypercarbic resp failure  PULMONARY / CRITICAL CARE MEDICINE  HPI:  66 yo female smoker admitted on 09/12/2011 with acute on chronic hypoxic/hypercapnic respiratory failure requiring BPAP 2nd to AECOPD.  Followed by MW as outpt (last seen Jan 2012).   PMHx CVA, CAD, DVT on chronic coumadin, OSA, hyperlipidemia   She reports feeling feverish over past few days.  This has been associated with increased cough and chest congestion.  She has been bringing up yellow sputum.  She has chronic sinus congestion, but this has been worse.  She has been more short of breath and wheezing more.  She denies chest pain, or hemoptysis.  She was found to have low oxygen in ED, and was lethargic.  Noted to have acute on chronic hypercapnia on ABG, and placed on BPAP.  Since then, she has improved with respiratory status and mentation.   Past Medical History  Diagnosis Date  . Myocardial infarction     times 2  . CHF (congestive heart failure)   . Stroke   . Deep vein thrombosis     Phlebitis  . Hyperlipidemia   . Sleep apnea   . 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 Coronary artery disease    Past Surgical History  Procedure Date  . Appendectomy   . Back surgery   . Cholecystectomy   . Coronary stent placement   . Breast surgery     benign tumors   Prior to Admission medications   Medication Sig Start Date End Date Taking? Authorizing Provider  atorvastatin (LIPITOR) 40 MG tablet Take 40 mg by mouth daily.   Yes Historical Provider, MD  buPROPion (WELLBUTRIN XL) 150 MG 24 hr tablet Take 150 mg by mouth daily. Use as directed    Yes Historical Provider, MD  citalopram (CELEXA) 20 MG tablet Take 20 mg by mouth daily.    Yes Historical Provider, MD  fenofibrate 160 MG tablet Take 160 mg by mouth daily.    Yes  Historical Provider, MD  Fluticasone-Salmeterol (ADVAIR) 100-50 MCG/DOSE AEPB Inhale 1 puff into the lungs every 12 (twelve) hours.   Yes Historical Provider, MD  folic acid (FOLVITE) 1 MG tablet Take 1 mg by mouth daily.    Yes Historical Provider, MD  furosemide (LASIX) 20 MG tablet Take 20 mg by mouth.   Yes Historical Provider, MD  gabapentin (NEURONTIN) 300 MG capsule Take 300 mg by mouth 4 (four) times daily.   Yes Historical Provider, MD  IPRATROPIUM-ALBUTEROL IN Inhale into the lungs.   Yes Historical Provider, MD  isosorbide mononitrate (IMDUR) 30 MG 24 hr tablet Take 30 mg by mouth daily. 04/06/11 04/05/12 Yes June Leap, MD  lisinopril (PRINIVIL,ZESTRIL) 5 MG tablet Take 5 mg by mouth daily.    Yes Historical Provider, MD  metoprolol tartrate (LOPRESSOR) 25 MG tablet Take 12.5 mg by mouth 2 (two) times daily.   Yes Historical Provider, MD  montelukast (SINGULAIR) 10 MG tablet Take 10 mg by mouth at bedtime.    Yes Historical Provider, MD  omeprazole (PRILOSEC) 20 MG capsule Take 20 mg by mouth.   Yes Historical Provider, MD  oxyCODONE-acetaminophen (PERCOCET) 10-325 MG per tablet Take 1 tablet by mouth every 8 (eight) hours as needed. For pain   Yes Historical Provider, MD  pantoprazole (PROTONIX) 40 MG tablet Take 40 mg by mouth daily.    Yes Historical Provider, MD  predniSONE (DELTASONE) 10 MG tablet Take 10 mg by mouth.   Yes Historical Provider, MD  tiotropium (SPIRIVA) 18 MCG inhalation capsule Place 18 mcg into inhaler and inhale daily.    Yes Historical Provider, MD  traMADol (ULTRAM) 50 MG tablet Take 50 mg by mouth.   Yes Historical Provider, MD  Vitamin D, Ergocalciferol, (DRISDOL) 50000 UNITS CAPS Take 50,000 Units by mouth.   Yes Historical Provider, MD  warfarin (COUMADIN) 3 MG tablet Take 0.5-1 tablets (1.5-3 mg total) by mouth daily. Take 1.5 mg tonight 07/14/2011, then starting 07/15/2011, take 1.5mg  on M,W,F and 3 mg on T,TH,Sa,Sun. 07/14/11  Yes Altha Harm, MD    clonazePAM (KLONOPIN) 0.5 MG tablet Take 1 tablet (0.5 mg total) by mouth 3 (three) times daily. 07/14/11 08/13/11  Altha Harm, MD  nitroGLYCERIN (NITROSTAT) 0.4 MG SL tablet Place 0.4 mg under the tongue every 5 (five) minutes as needed. For chest pain    Historical Provider, MD  OXYGEN-HELIUM IN Inhale into the lungs continuous. 2 liters Philipp Deputy     Historical Provider, MD   Allergies Allergies  Allergen Reactions  . Aspirin   . Penicillins Hives and Itching    Family History Family History  Problem Relation Age of Onset  . Hypertension Mother   . Breast cancer Mother   . Rheum arthritis Maternal Grandmother    Social History  reports that she has been smoking Cigarettes.  She has a 40 pack-year smoking history. She does not have any smokeless tobacco history on file. She reports that she does not drink alcohol or use illicit drugs.  Review Of Systems:  Negative except above.  Events Since Admission: Placed on nimvs @0620   Current Status: On bipap  Vital Signs: Temp:  [100 F (37.8 C)] 100 F (37.8 C) (05/25 0742) Pulse Rate:  [93-109] 109  (05/25 0830) Resp:  [17-35] 27  (05/25 0830) BP: (122-146)/(57-80) 127/70 mmHg (05/25 0830) SpO2:  [87 %-99 %] 94 % (05/25 0830) FiO2 (%):  [50 %] 50 % (05/25 0747)  Physical Examination: General - No distress, on NIPPV HEENT - no LAN Cardiac - s1s2 regular, no murmur Chest - scattered rhonchi, no wheeze Abd - soft, nontender Ext - no edema, cool  Neuro - A&O x 3, normal strength  CBC    Component Value Date/Time   WBC 7.7 09/12/2011 1012   RBC 4.37 09/12/2011 1012   HGB 12.8 09/12/2011 1012   HCT 41.3 09/12/2011 1012   PLT 282 09/12/2011 1012   MCV 94.5 09/12/2011 1012   MCH 29.3 09/12/2011 1012   MCHC 31.0 09/12/2011 1012   RDW 14.2 09/12/2011 1012   LYMPHSABS 4.5* 09/12/2011 0650   MONOABS 1.3* 09/12/2011 0650   EOSABS 0.1 09/12/2011 0650   BASOSABS 0.1 09/12/2011 0650    BMET    Component Value Date/Time   NA 139  09/12/2011 1012   K 3.6 09/12/2011 1012   CL 91* 09/12/2011 1012   CO2 44* 09/12/2011 1012   GLUCOSE 148* 09/12/2011 1012   BUN 7 09/12/2011 1012   CREATININE 0.66 09/12/2011 1012   CALCIUM 9.4 09/12/2011 1012   GFRNONAA 89* 09/12/2011 1012   GFRAA >90 09/12/2011 1012    Lab Results  Component Value Date   ALT 21 09/12/2011   AST 37 09/12/2011   ALKPHOS 36* 09/12/2011   BILITOT 0.5 09/12/2011  ABG    Component Value Date/Time   PHART 7.389 09/12/2011 1300   PCO2ART 70.8* 09/12/2011 1300   PO2ART 73.5* 09/12/2011 1300   HCO3 41.8* 09/12/2011 1300   TCO2 43.9 09/12/2011 1300   O2SAT 95.8 09/12/2011 1300    Dg Chest 2 View  09/12/2011  *RADIOLOGY REPORT*  Clinical Data: Shortness of breath.  CHF.  Stroke.  Myocardial infarct.  CHEST - 2 VIEW  Comparison: 07/08/2011  Findings: Shallow inspiration.  Heart size and pulmonary vascularity are normal.  Emphysematous changes in the lungs with scattered fibrosis.  No focal airspace consolidation.  No blunting of costophrenic angles.  No pneumothorax.  Calcified and tortuous aorta.  Degenerative changes in the thoracic spine with multiple thoracic vertebral compression deformities status post kyphoplasty. No significant changes since the previous study.  IMPRESSION: Emphysematous changes and scattered fibrosis in the lungs.  No evidence of active pulmonary disease.  Original Report Authenticated By: Marlon Pel, M.D.   Ct Head Wo Contrast  09/12/2011  *RADIOLOGY REPORT*  Clinical Data: Altered mental status.  Prior CVA.  CT HEAD WITHOUT CONTRAST  Technique:  Contiguous axial images were obtained from the base of the skull through the vertex without contrast.  Comparison: 12/17/2009 CT examination from [facility].  Findings: The brain stem, cerebellum, cerebral peduncles, thalami, basal ganglia, basilar cisterns, and ventricular system appear unremarkable.  No intracranial hemorrhage, mass lesion, or acute infarction is identified.  Periventricular  and corona radiata white matter hypodensities are most compatible with chronic ischemic microvascular white matter disease.  IMPRESSION:  1.  Mild chronic ischemic microvascular white matter disease.  No acute intracranial findings are observed.  Original Report Authenticated By: Dellia Cloud, M.D.          ASSESSMENT AND PLAN  PULMONARY   A:  Hypercarbic resp failure acute on chronic likely from AECOPD P:   -bipap prn -bd's -steroids -empirical abx  CARDIOVASCULAR   A:hx of cad, DVT P:  -12 lead -ce x 3  RENAL   Foley:  5/25  A:  hypokalemia P:   -replete  GASTROINTESTINAL   A:  nutrtion P:   -advance diet as tolerated now that mental status improved  HEMATOLOGIC  A:  hypercoagulable  P:  -hold coumadin and f/u INR  INFECTIOUS  Cultures: 5/25 bc x 2>> 5/25 uc>> 5/25 sputum>> Antibiotics: 5/25 avelox  A:  Acute purulent bronchitis P:   -see flow  ENDOCRINE  A: no hx of DM P:   Monitor CBG while on steroids   NEUROLOGIC  A:  Hx stroke.  No acute findings on CT head 5/25.  Mental status improved with BPAP. P:   -monitor clinically  BEST PRACTICE / DISPOSITIO - Level of Care:  icu - Primary Service:  pccm - Consultants:  none - Code Status:  Full but states she does not want intubation 5/25. - Diet:  npo - DVT Px:  scd - GI Px:  ppi - Skin Integrity:  intact - Social / Family:  Husband admitted same day(5/25) to [facility].  Brett Canales Minor ACNP [facility] PCCM Pager [phone] till 3 pm If no answer page [phone] 09/12/2011, 8:47 AM   Reviewed above, examined pt, and agree with assessment/plan.  Coralyn Helling, MD 09/12/2011, 2:32 PM Pager:  [phone]

## 2011-09-12 NOTE — Progress Notes (Signed)
ABG was done on 4lpm nasal cannula prior to initiating Bipap.

## 2011-09-12 NOTE — ED Provider Notes (Signed)
History     CSN: [id number]  Arrival date & time 09/12/11  0543   First MD Initiated Contact with Patient 09/12/11 [phone]      Chief Complaint  Patient presents with  . Shortness of Breath    (Consider location/radiation/quality/duration/timing/severity/associated sxs/prior treatment) HPI Patient presents emergency Dept. with shortness of breath that began 2-3 days ago.  Patient is unable to give me much history due to her condition.  EMS reports the patient had initial pulse ox 87%.  She was placed on a nonrebreather mask and her pulse ox raised to the upper 90s.  Patient is lethargic, on exam.  Past Medical History  Diagnosis Date  . Myocardial infarction     times 2  . CHF (congestive heart failure)   . Stroke   . Deep vein thrombosis     Phlebitis  . Hyperlipidemia   . Sleep apnea   . 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 Coronary artery disease     Past Surgical History  Procedure Date  . Appendectomy   . Back surgery   . Cholecystectomy   . Coronary stent placement   . Breast surgery     benign tumors    Family History  Problem Relation Age of Onset  . Hypertension Mother   . Breast cancer Mother   . Rheum arthritis Maternal Grandmother     History  Substance Use Topics  . Smoking status: Current Everyday Smoker -- 1.0 packs/day for 40 years    Types: Cigarettes  . Smokeless tobacco: Not on file   Comment: started smoking in 1969  . Alcohol Use: No    OB History    Grav Para Term Preterm Abortions TAB SAB Ect Mult Living                  Review of Systems Level V caveat due to patient condition  Allergies  Aspirin and Penicillins  Home Medications   Current Outpatient Rx  Name Route Sig Dispense Refill  . ATORVASTATIN CALCIUM 40 MG PO TABS Oral Take 40 mg by mouth daily.    . BUPROPION HCL ER (XL) 150 MG PO TB24 Oral Take 150 mg by mouth daily. Use as directed     .  CITALOPRAM HYDROBROMIDE 20 MG PO TABS Oral Take 20 mg by mouth daily.     . FENOFIBRATE 160 MG PO TABS Oral Take 160 mg by mouth daily.     Marland Kitchen FLUTICASONE-SALMETEROL 100-50 MCG/DOSE IN AEPB Inhalation Inhale 1 puff into the lungs every 12 (twelve) hours.    Marland Kitchen FOLIC ACID 1 MG PO TABS Oral Take 1 mg by mouth daily.     . FUROSEMIDE 20 MG PO TABS Oral Take 20 mg by mouth.    Marland Kitchen GABAPENTIN 300 MG PO CAPS Oral Take 300 mg by mouth 4 (four) times daily.    Maximino Green[place] IN Inhalation Inhale into the lungs.    . ISOSORBIDE MONONITRATE ER 30 MG PO TB24 Oral Take 30 mg by mouth daily.    Marland Kitchen LISINOPRIL 5 MG PO TABS Oral Take 5 mg by mouth daily.     Marland Kitchen METOPROLOL TARTRATE 25 MG PO TABS Oral Take 12.5 mg by mouth 2 (two) times daily.    Marland Kitchen MONTELUKAST SODIUM 10 MG PO TABS Oral Take 10 mg by mouth at bedtime.     . OMEPRAZOLE 20 MG PO CPDR Oral  Take 20 mg by mouth.    . OXYCODONE-ACETAMINOPHEN 10-325 MG PO TABS Oral Take 1 tablet by mouth every 8 (eight) hours as needed. For pain    . PANTOPRAZOLE SODIUM 40 MG PO TBEC Oral Take 40 mg by mouth daily.     Marland Kitchen PREDNISONE 10 MG PO TABS Oral Take 10 mg by mouth.    Marland Kitchen TIOTROPIUM BROMIDE MONOHYDRATE 18 MCG IN CAPS Inhalation Place 18 mcg into inhaler and inhale daily.     . TRAMADOL HCL 50 MG PO TABS Oral Take 50 mg by mouth.    Marland Kitchen VITAMIN D (ERGOCALCIFEROL) 50000 UNITS PO CAPS Oral Take 50,000 Units by mouth.    . WARFARIN SODIUM 3 MG PO TABS Oral Take 0.5-1 tablets (1.5-3 mg total) by mouth daily. Take 1.5 mg tonight 07/14/2011, then starting 07/15/2011, take 1.5mg  on M,W,F and 3 mg on T,TH,Sa,Sun. 30 tablet 0  . CLONAZEPAM 0.5 MG PO TABS Oral Take 1 tablet (0.5 mg total) by mouth 3 (three) times daily. 30 tablet 0  . NITROGLYCERIN 0.4 MG SL SUBL Sublingual Place 0.4 mg under the tongue every 5 (five) minutes as needed. For chest pain    . OXYGEN-HELIUM IN Inhalation Inhale into the lungs continuous. 2 liters /[place]       BP 122/70  Pulse 103  Temp(Src) 100 F  (37.8 C) (Oral)  Resp 26  SpO2 97%  Physical Exam  Constitutional: She appears well-developed and well-nourished. She appears lethargic. She appears distressed. Face mask in place.  HENT:  Head: Normocephalic and atraumatic.  Eyes: Pupils are equal, round, and reactive to light.  Neck: Neck supple. No JVD present.  Cardiovascular: Normal rate, regular rhythm and normal heart sounds.   Pulmonary/Chest: Accessory muscle usage present. Tachypnea noted. She is in respiratory distress. She has rhonchi. She has rales.  Neurological: She appears lethargic. GCS eye subscore is 3. GCS verbal subscore is 4. GCS motor subscore is 6.  Skin: Skin is warm and dry. No rash noted. She is not diaphoretic.    ED Course  Procedures (including critical care time)  Labs Reviewed  CBC - Abnormal; Notable for the following:    WBC 11.7 (*)    All other components within normal limits  DIFFERENTIAL - Abnormal; Notable for the following:    Lymphs Abs 4.5 (*)    Monocytes Absolute 1.3 (*)    All other components within normal limits  BASIC METABOLIC PANEL - Abnormal; Notable for the following:    Potassium 3.3 (*)    Chloride 92 (*)    CO2 >45 (*)    Glucose, Bld 130 (*)    GFR calc non Af Amer 85 (*)    All other components within normal limits  PROTIME-INR - Abnormal; Notable for the following:    Prothrombin Time 42.7 (*)    INR 4.41 (*)    All other components within normal limits  PRO B NATRIURETIC PEPTIDE - Abnormal; Notable for the following:    Pro B Natriuretic peptide (BNP) 663.0 (*)    All other components within normal limits  POCT I-STAT 3, BLOOD GAS (G3+) - Abnormal; Notable for the following:    pH, Arterial 7.286 (*)    pCO2 arterial 103.8 (*)    pO2, Arterial 71.0 (*)    Bicarbonate 49.5 (*)    Acid-Base Excess 17.0 (*)    All other components within normal limits  TROPONIN I   Dg Chest 2 View  09/12/2011  *RADIOLOGY  REPORT*  Clinical Data: Shortness of breath.  CHF.   Stroke.  Myocardial infarct.  CHEST - 2 VIEW  Comparison: 07/08/2011  Findings: Shallow inspiration.  Heart size and pulmonary vascularity are normal.  Emphysematous changes in the lungs with scattered fibrosis.  No focal airspace consolidation.  No blunting of costophrenic angles.  No pneumothorax.  Calcified and tortuous aorta.  Degenerative changes in the thoracic spine with multiple thoracic vertebral compression deformities status post kyphoplasty. No significant changes since the previous study.  IMPRESSION: Emphysematous changes and scattered fibrosis in the lungs.  No evidence of active pulmonary disease.  Original Report Authenticated By: Marlon Pel, M.D.     CRITICAL CARE Performed by: Carlyle Dolly   Total critical care time: 40  Critical care time was exclusive of separately billable procedures and treating other patients.  Critical care was necessary to treat or prevent imminent or life-threatening deterioration.  Critical care was time spent personally by me on the following activities: development of treatment plan with patient and/or surrogate as well as nursing, discussions with consultants, evaluation of patient's response to treatment, examination of patient, obtaining history from patient or surrogate, ordering and performing treatments and interventions, ordering and review of laboratory studies, ordering and review of radiographic studies, pulse oximetry and re-evaluation of patient's condition.   Pulmonary critical care has been called to come evaluate the patient for admission.  Patient has remained stable for the most part here in the emergency department.  Patient was rechecked and after being on BiPAP and started to arouse and be more alert.  MDM  MDM Reviewed: nursing note, previous chart and vitals Reviewed previous: labs, ECG and x-ray Interpretation: labs, ECG and x-ray Consults: critical care and pulmonary   Date: 09/12/2011  Rate: 97  Rhythm:  normal sinus rhythm  QRS Axis: normal  Intervals: normal  ST/T Wave abnormalities: normal  Conduction Disutrbances:none  Narrative Interpretation:   Old EKG Reviewed: unchanged            Carlyle Dolly, PA-C 09/12/11 1202  Carlyle Dolly, PA-C 09/12/11 1203

## 2011-09-12 NOTE — Progress Notes (Signed)
ANTIBIOTIC CONSULT NOTE - INITIAL  Pharmacy Consult for avelox Indication: AECOPD  Allergies  Allergen Reactions  . Aspirin Other (See Comments)    Son does not remember reaction  . Penicillins Hives and Itching    Patient Measurements:   Adjusted Body Weight:   Vital Signs: Temp: 100 F (37.8 C) (05/25 0742) Temp src: Oral (05/25 0742) BP: 135/74 mmHg (05/25 0929) Pulse Rate: 109  (05/25 0830) Intake/Output from previous day:   Intake/Output from this shift:    Labs:  Basename 09/12/11 0650  WBC 11.7*  HGB 12.8  PLT 300  LABCREA --  CREATININE 0.78   The CrCl is unknown because both a height and weight (above a minimum accepted value) are required for this calculation. No results found for this basename: VANCOTROUGH:2,VANCOPEAK:2,VANCORANDOM:2,GENTTROUGH:2,GENTPEAK:2,GENTRANDOM:2,TOBRATROUGH:2,TOBRAPEAK:2,TOBRARND:2,AMIKACINPEAK:2,AMIKACINTROU:2,AMIKACIN:2, in the last 72 hours   Microbiology: No results found for this or any previous visit (from the past 720 hour(s)).  Medical History: Past Medical History  Diagnosis Date  . Myocardial infarction     times 2  . CHF (congestive heart failure)   . Stroke   . Deep vein thrombosis     Phlebitis  . Hyperlipidemia   . Sleep apnea   . 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 Coronary artery disease     Medications:  Scheduled:    . albuterol  2.5 mg Nebulization Q6H  . dexamethasone  10 mg Intravenous Once  . ipratropium  0.5 mg Nebulization Q6H  . methylPREDNISolone (SOLU-MEDROL) injection  60 mg Intravenous Q8H  . moxifloxacin  400 mg Intravenous Q24H  . pantoprazole (PROTONIX) IV  40 mg Intravenous QHS   Assessment: 66 YOm presents with SOB and hypercarbic respiratory failure d/t COPD.  Orders to start avelox and Rx to adjust.   He is on coumadin prior to admission for h/o DVT but orders to hold as INR is 4.41, no bleeding  noted   Plan:  1. Avelox 400 Mg IV q24h, no need to renally adjust.  Change to PO as appropriate 2. Await orders to resume warfarin when appropriate.   Dannielle Huh 09/12/2011,9:42 AM

## 2011-09-12 NOTE — ED Notes (Signed)
PT arrived via [facility] c/o SOB. EMS states Rales all lobes. Pt pale cold but not clammy. Pt A&O x 3. Per initial EMS report pt first O2 sat 87%. PT O2 sat after EMS placed on NRB up to 95%. PT husband at [facility] hospital. PT lives with husband and and daughter at home.

## 2011-09-12 NOTE — ED Notes (Signed)
Updated respiratory about hour long neb and ABG

## 2011-09-13 ENCOUNTER — Encounter (HOSPITAL_COMMUNITY): Payer: Self-pay

## 2011-09-13 LAB — CARDIAC PANEL(CRET KIN+CKTOT+MB+TROPI)
CK, MB: 2.6 ng/mL (ref 0.3–4.0)
Total CK: 77 U/L (ref 7–177)

## 2011-09-13 LAB — APTT: aPTT: 77 seconds — ABNORMAL HIGH (ref 24–37)

## 2011-09-13 LAB — BASIC METABOLIC PANEL
BUN: 9 mg/dL (ref 6–23)
CO2: 39 mEq/L — ABNORMAL HIGH (ref 19–32)
Calcium: 8.9 mg/dL (ref 8.4–10.5)
Chloride: 92 mEq/L — ABNORMAL LOW (ref 96–112)
Creatinine, Ser: 0.61 mg/dL (ref 0.50–1.10)

## 2011-09-13 LAB — CBC
HCT: 32.5 % — ABNORMAL LOW (ref 36.0–46.0)
MCH: 29.1 pg (ref 26.0–34.0)
MCV: 91 fL (ref 78.0–100.0)
Platelets: 243 10*3/uL (ref 150–400)
RBC: 3.57 MIL/uL — ABNORMAL LOW (ref 3.87–5.11)
WBC: 6 10*3/uL (ref 4.0–10.5)

## 2011-09-13 MED ORDER — OXYCODONE-ACETAMINOPHEN 5-325 MG PO TABS
2.0000 | ORAL_TABLET | ORAL | Status: DC | PRN
  Administered 2011-09-13: 2 via ORAL
  Filled 2011-09-13: qty 2

## 2011-09-13 MED ORDER — FLUTICASONE-SALMETEROL 250-50 MCG/DOSE IN AEPB
1.0000 | INHALATION_SPRAY | Freq: Two times a day (BID) | RESPIRATORY_TRACT | Status: DC
  Administered 2011-09-13 – 2011-09-14 (×3): 1 via RESPIRATORY_TRACT
  Filled 2011-09-13: qty 14

## 2011-09-13 MED ORDER — POTASSIUM CHLORIDE CRYS ER 20 MEQ PO TBCR
40.0000 meq | EXTENDED_RELEASE_TABLET | Freq: Two times a day (BID) | ORAL | Status: AC
  Administered 2011-09-13: 40 meq via ORAL
  Filled 2011-09-13: qty 2

## 2011-09-13 MED ORDER — TIOTROPIUM BROMIDE MONOHYDRATE 18 MCG IN CAPS
18.0000 ug | ORAL_CAPSULE | Freq: Every day | RESPIRATORY_TRACT | Status: DC
  Administered 2011-09-13 – 2011-09-16 (×4): 18 ug via RESPIRATORY_TRACT
  Filled 2011-09-13 (×2): qty 5

## 2011-09-13 MED ORDER — METHYLPREDNISOLONE SODIUM SUCC 40 MG IJ SOLR
40.0000 mg | Freq: Two times a day (BID) | INTRAMUSCULAR | Status: DC
  Administered 2011-09-13 – 2011-09-14 (×2): 40 mg via INTRAVENOUS
  Filled 2011-09-13 (×4): qty 1

## 2011-09-13 MED ORDER — ALBUTEROL SULFATE (5 MG/ML) 0.5% IN NEBU
2.5000 mg | INHALATION_SOLUTION | Freq: Three times a day (TID) | RESPIRATORY_TRACT | Status: DC
  Administered 2011-09-13 – 2011-09-14 (×4): 2.5 mg via RESPIRATORY_TRACT
  Filled 2011-09-13 (×5): qty 0.5

## 2011-09-13 MED ORDER — WARFARIN - PHARMACIST DOSING INPATIENT: Freq: Every day | Status: DC

## 2011-09-13 MED ORDER — POTASSIUM CHLORIDE CRYS ER 20 MEQ PO TBCR
40.0000 meq | EXTENDED_RELEASE_TABLET | Freq: Two times a day (BID) | ORAL | Status: DC
  Administered 2011-09-13: 40 meq via ORAL
  Filled 2011-09-13: qty 2

## 2011-09-13 MED ORDER — PANTOPRAZOLE SODIUM 40 MG PO TBEC
40.0000 mg | DELAYED_RELEASE_TABLET | Freq: Every day | ORAL | Status: DC
Start: 2011-09-13 — End: 2011-09-16
  Administered 2011-09-13 – 2011-09-16 (×4): 40 mg via ORAL
  Filled 2011-09-13 (×4): qty 1

## 2011-09-13 MED ORDER — CLONAZEPAM 0.5 MG PO TABS
0.5000 mg | ORAL_TABLET | Freq: Three times a day (TID) | ORAL | Status: DC | PRN
  Administered 2011-09-13 – 2011-09-15 (×4): 0.5 mg via ORAL
  Filled 2011-09-13 (×5): qty 1

## 2011-09-13 NOTE — Progress Notes (Signed)
Critical lab value called, INR 5.66.  MD paged DR.Sood, called back immediately. Advised to watch patient and will continue to follow.  Aimee Sanders 09/13/2011 1130 am

## 2011-09-13 NOTE — Progress Notes (Signed)
Name: Aimee Sanders MRN: [id number] DOB: [birth year]    LOS: 1  Referring Provider:  EDP Reason for Referral:  Hypercarbic resp failure  PULMONARY / CRITICAL CARE MEDICINE  HPI:  66 yo female smoker admitted on 09/12/2011 with acute on chronic hypoxic/hypercapnic respiratory failure requiring BPAP 2nd to AECOPD.  Followed by MW as outpt (last seen Jan 2012).   PMHx CVA, CAD, DVT on chronic coumadin, OSA, hyperlipidemia   Events Since Admission: Placed on nimvs @0620   Current Status:  Did not need BPAP since yesterday afternoon.  Breathing better.  Decreased chest congestion, but still has cough with sputum.  Slept well.  Vital Signs: Temp:  [97.6 F (36.4 C)-100.2 F (37.9 C)] 97.6 F (36.4 C) (05/26 0600) Pulse Rate:  [66-112] 67  (05/26 0600) Resp:  [18-27] 20  (05/26 0600) BP: (91-149)/(41-84) 106/47 mmHg (05/26 0400) SpO2:  [86 %-97 %] 92 % (05/26 0600) FiO2 (%):  [50 %-60 %] 50 % (05/25 1300) Weight:  [129 lb 10.1 oz (58.8 kg)] 129 lb 10.1 oz (58.8 kg) (05/26 0400)  Physical Examination: General - No distress, speaking in full sentences HEENT - no LAN Cardiac - s1s2 regular, no murmur Chest - no wheeze/rales Abd - soft, nontender Ext - no edema Neuro - A&O x 3, normal strength  CBC    Component Value Date/Time   WBC 6.0 09/12/2011 2357   RBC 3.57* 09/12/2011 2357   HGB 10.4* 09/12/2011 2357   HCT 32.5* 09/12/2011 2357   PLT 243 09/12/2011 2357   MCV 91.0 09/12/2011 2357   MCH 29.1 09/12/2011 2357   MCHC 32.0 09/12/2011 2357   RDW 13.9 09/12/2011 2357   LYMPHSABS 4.5* 09/12/2011 0650   MONOABS 1.3* 09/12/2011 0650   EOSABS 0.1 09/12/2011 0650   BASOSABS 0.1 09/12/2011 0650    BMET    Component Value Date/Time   NA 136 09/12/2011 2357   K 3.1* 09/12/2011 2357   CL 92* 09/12/2011 2357   CO2 39* 09/12/2011 2357   GLUCOSE 157* 09/12/2011 2357   BUN 9 09/12/2011 2357   CREATININE 0.61 09/12/2011 2357   CALCIUM 8.9 09/12/2011 2357   GFRNONAA >90 09/12/2011 2357   GFRAA  >90 09/12/2011 2357    Lab Results  Component Value Date   ALT 21 09/12/2011   AST 37 09/12/2011   ALKPHOS 36* 09/12/2011   BILITOT 0.5 09/12/2011   ABG    Component Value Date/Time   PHART 7.389 09/12/2011 1300   PCO2ART 70.8* 09/12/2011 1300   PO2ART 73.5* 09/12/2011 1300   HCO3 41.8* 09/12/2011 1300   TCO2 43.9 09/12/2011 1300   O2SAT 95.8 09/12/2011 1300    Dg Chest 2 View  09/12/2011  *RADIOLOGY REPORT*  Clinical Data: Shortness of breath.  CHF.  Stroke.  Myocardial infarct.  CHEST - 2 VIEW  Comparison: 07/08/2011  Findings: Shallow inspiration.  Heart size and pulmonary vascularity are normal.  Emphysematous changes in the lungs with scattered fibrosis.  No focal airspace consolidation.  No blunting of costophrenic angles.  No pneumothorax.  Calcified and tortuous aorta.  Degenerative changes in the thoracic spine with multiple thoracic vertebral compression deformities status post kyphoplasty. No significant changes since the previous study.  IMPRESSION: Emphysematous changes and scattered fibrosis in the lungs.  No evidence of active pulmonary disease.  Original Report Authenticated By: Marlon Pel, M.D.   Ct Head Wo Contrast  09/12/2011  *RADIOLOGY REPORT*  Clinical Data: Altered mental status.  Prior CVA.  CT HEAD  WITHOUT CONTRAST  Technique:  Contiguous axial images were obtained from the base of the skull through the vertex without contrast.  Comparison: 12/17/2009 CT examination from [facility].  Findings: The brain stem, cerebellum, cerebral peduncles, thalami, basal ganglia, basilar cisterns, and ventricular system appear unremarkable.  No intracranial hemorrhage, mass lesion, or acute infarction is identified.  Periventricular and corona radiata white matter hypodensities are most compatible with chronic ischemic microvascular white matter disease.  IMPRESSION:  1.  Mild chronic ischemic microvascular white matter disease.  No acute intracranial findings are observed.   Original Report Authenticated By: Dellia Cloud, M.D.    ASSESSMENT AND PLAN  PULMONARY   A:  Hypercarbic resp failure acute on chronic likely from AECOPD>>much improved 5/26. P:   -d/c bipap -bd's>>resume spiriva, advair -change solumedrol to 40 mg IV q12h -empirical abx  CARDIOVASCULAR   A: hx of cad, DVT, hyperlipidemia.  Allergy to ASA. P:  -coumadin per pharmacy  RENAL   Foley:  5/25>>5/26  A:  hypokalemia P:   -f/u and replace as needed  GASTROINTESTINAL   A:  nutrtion P:   -heart healthy diet  HEMATOLOGIC  A:  hypercoagulable  P:  -pharmacy to dose coumadin  INFECTIOUS  Cultures: 5/25 bc x 2>> 5/25 uc>> 5/25 sputum>>  Antibiotics: 5/25 avelox>>  A:  Acute purulent bronchitis P:   -see flow  ENDOCRINE CBG (last 3)   Basename 09/12/11 1244  GLUCAP 150*    A: no hx of DM P:   Monitor CBG while on steroids   NEUROLOGIC  A:  Hx stroke.  No acute findings on CT head 5/25.  Mental status improved with BPAP. P:   -monitor clinically  BEST PRACTICE / DISPOSITION - Level of Care:  Med-surg - Primary Service:  pccm - Consultants:  none - Code Status:  Full but states she does not want intubation 5/25. - Diet:  Heart healthy - DVT Px:  Coumadin per pharmacy - GI Px:  ppi - Skin Integrity:  intact - Social / Family:  Husband admitted same day(5/25) to [facility].    Coralyn Helling, MD 09/13/2011, 7:58 AM Pager:  [phone]

## 2011-09-13 NOTE — Progress Notes (Signed)
ANTICOAGULATION CONSULT NOTE - Initial Consult  Pharmacy Consult for Warfarin Indication: Hx DVT & CVA  Allergies  Allergen Reactions  . Aspirin Other (See Comments)    Son does not remember reaction  . Penicillins Hives and Itching    Patient Measurements: Height: 5\' 3"  (160 cm) Weight: 129 lb 10.1 oz (58.8 kg) IBW/kg (Calculated) : 52.4   Vital Signs: Temp: 97.8 F (36.6 C) (05/26 0900) BP: 96/48 mmHg (05/26 0900) Pulse Rate: 78  (05/26 0900)  Labs:  Basename 09/13/11 0958 09/12/11 2357 09/12/11 2356 09/12/11 1753 09/12/11 1014 09/12/11 1012 09/12/11 0650  HGB -- 10.4* -- -- -- 12.8 --  HCT -- 32.5* -- -- -- 41.3 40.4  PLT -- 243 -- -- -- 282 300  APTT -- 77* -- -- -- -- --  LABPROT 51.9* -- -- -- -- -- 42.7*  INR 5.66* -- -- -- -- -- 4.41*  HEPARINUNFRC -- -- -- -- -- -- --  CREATININE -- 0.61 -- -- -- 0.66 0.78  CKTOTAL -- -- 77 82 54 -- --  CKMB -- -- 2.6 2.5 1.7 -- --  TROPONINI -- -- <0.30 <0.30 <0.30 -- --    Estimated Creatinine Clearance: 56.4 ml/min (by C-G formula based on Cr of 0.61).   Medical History: Past Medical History  Diagnosis Date  . Myocardial infarction     times 2  . CHF (congestive heart failure)   . Stroke   . Deep vein thrombosis     Phlebitis  . Hyperlipidemia   . Sleep apnea   . 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 Coronary artery disease     Assessment: 66 y.o. F on warfarin PTA for hx DVT & CVA and admitted with a SUPRAtherapeutic INR on 5/25. INR still SUPRAtherapeutic this a.m and trended up despite no dose yet this admission (INR 5.66 << 4.41). The patient claims she did not take her dose yesterday -- so her last dose would have been on 5/24. Her dose appears to be adjusted frequently -- she claims her last known dose was 3 mg daily EXCEPT for 1.5 mg on Wednesdays only -- however the patient was slightly confused upon conversation. Hgb/Hct/Plt trended  down slightly -- no s/sx of bleeding noted. The nurse contacted the MD regarding the elevated INR and no reversal agents have been ordered at this time. Bed alarms have been activated and the nurse is aware to be careful when moving the patient around today. The patient was re-educated on warfarin.  Goal of Therapy:  INR 2-3 Monitor platelets by anticoagulation protocol: Yes   Plan:  1. Hold warfarin dose today 2. Will continue to monitor for any signs/symptoms of bleeding and will follow up with PT/INR in the a.m.   Georgina Pillion, PharmD, BCPS Clinical Pharmacist Pager: [phone] 09/13/2011 12:29 PM

## 2011-09-14 DIAGNOSIS — I219 Acute myocardial infarction, unspecified: Secondary | ICD-10-CM

## 2011-09-14 LAB — CBC
HCT: 32.3 % — ABNORMAL LOW (ref 36.0–46.0)
Hemoglobin: 10.5 g/dL — ABNORMAL LOW (ref 12.0–15.0)
MCH: 29.1 pg (ref 26.0–34.0)
MCHC: 32.5 g/dL (ref 30.0–36.0)
MCV: 89.5 fL (ref 78.0–100.0)
RBC: 3.61 MIL/uL — ABNORMAL LOW (ref 3.87–5.11)

## 2011-09-14 LAB — URINE CULTURE: Culture  Setup Time: 201305251734

## 2011-09-14 LAB — BASIC METABOLIC PANEL
BUN: 11 mg/dL (ref 6–23)
CO2: 33 mEq/L — ABNORMAL HIGH (ref 19–32)
Chloride: 102 mEq/L (ref 96–112)
Glucose, Bld: 95 mg/dL (ref 70–99)
Potassium: 4.2 mEq/L (ref 3.5–5.1)
Sodium: 139 mEq/L (ref 135–145)

## 2011-09-14 LAB — MAGNESIUM: Magnesium: 1.9 mg/dL (ref 1.5–2.5)

## 2011-09-14 LAB — LEGIONELLA ANTIGEN, URINE

## 2011-09-14 LAB — PROTIME-INR: INR: 5 — ABNORMAL HIGH (ref 0.00–1.49)

## 2011-09-14 MED ORDER — PREDNISONE 20 MG PO TABS
40.0000 mg | ORAL_TABLET | Freq: Two times a day (BID) | ORAL | Status: DC
  Administered 2011-09-14 – 2011-09-15 (×2): 40 mg via ORAL
  Filled 2011-09-14 (×4): qty 2

## 2011-09-14 MED ORDER — OXYCODONE HCL 5 MG PO TABS: 5.0000 mg | ORAL_TABLET | Freq: Three times a day (TID) | ORAL | Status: DC | PRN

## 2011-09-14 MED ORDER — MOXIFLOXACIN HCL 400 MG PO TABS
400.0000 mg | ORAL_TABLET | Freq: Every day | ORAL | Status: DC
  Administered 2011-09-15: 400 mg via ORAL
  Filled 2011-09-14 (×2): qty 1

## 2011-09-14 MED ORDER — TRAMADOL HCL 50 MG PO TABS
50.0000 mg | ORAL_TABLET | Freq: Four times a day (QID) | ORAL | Status: DC | PRN
  Administered 2011-09-14: 50 mg via ORAL
  Filled 2011-09-14: qty 1

## 2011-09-14 MED ORDER — OXYCODONE-ACETAMINOPHEN 5-325 MG PO TABS
1.0000 | ORAL_TABLET | Freq: Three times a day (TID) | ORAL | Status: DC | PRN
  Administered 2011-09-14 – 2011-09-15 (×4): 1 via ORAL
  Filled 2011-09-14 (×4): qty 1

## 2011-09-14 MED ORDER — OXYCODONE-ACETAMINOPHEN 10-325 MG PO TABS: 1.0000 | ORAL_TABLET | Freq: Three times a day (TID) | ORAL | Status: DC | PRN

## 2011-09-14 MED ORDER — FLUTICASONE-SALMETEROL 250-50 MCG/DOSE IN AEPB
1.0000 | INHALATION_SPRAY | Freq: Two times a day (BID) | RESPIRATORY_TRACT | Status: DC
  Administered 2011-09-14 – 2011-09-16 (×4): 1 via RESPIRATORY_TRACT
  Filled 2011-09-14: qty 14

## 2011-09-14 MED ORDER — METOPROLOL TARTRATE 12.5 MG HALF TABLET
12.5000 mg | ORAL_TABLET | Freq: Two times a day (BID) | ORAL | Status: DC
  Administered 2011-09-15 (×2): 12.5 mg via ORAL
  Filled 2011-09-14 (×5): qty 1

## 2011-09-14 MED ORDER — ALBUTEROL SULFATE (5 MG/ML) 0.5% IN NEBU
2.5000 mg | INHALATION_SOLUTION | Freq: Three times a day (TID) | RESPIRATORY_TRACT | Status: DC
  Administered 2011-09-14 – 2011-09-16 (×4): 2.5 mg via RESPIRATORY_TRACT
  Filled 2011-09-14 (×5): qty 0.5

## 2011-09-14 MED ORDER — ISOSORBIDE MONONITRATE ER 30 MG PO TB24
30.0000 mg | ORAL_TABLET | Freq: Every day | ORAL | Status: DC
  Administered 2011-09-15: 30 mg via ORAL
  Filled 2011-09-14 (×2): qty 1

## 2011-09-14 NOTE — ED Provider Notes (Signed)
Medical screening examination/treatment/procedure(s) were conducted as a shared visit with non-physician practitioner(s) and myself.  I personally evaluated the patient during the encounter   Loren Racer, MD 09/14/11 [phone]

## 2011-09-14 NOTE — Progress Notes (Signed)
ANTICOAGULATION CONSULT NOTE - Initial Consult  Pharmacy Consult for Warfarin Indication: Hx DVT & CVA  Allergies  Allergen Reactions  . Aspirin Other (See Comments)    Son does not remember reaction  . Penicillins Hives and Itching    Patient Measurements: Height: 5\' 3"  (160 cm) Weight: 129 lb 10.1 oz (58.8 kg) IBW/kg (Calculated) : 52.4   Vital Signs: Temp: 98.1 F (36.7 C) (05/27 0702) BP: 104/62 mmHg (05/27 0702) Pulse Rate: 100  (05/27 0702)  Labs:  Basename 09/14/11 0500 09/13/11 0958 09/12/11 2357 09/12/11 2356 09/12/11 1753 09/12/11 1014 09/12/11 1012 09/12/11 0650  HGB 10.5* -- 10.4* -- -- -- -- --  HCT 32.3* -- 32.5* -- -- -- 41.3 --  PLT 249 -- 243 -- -- -- 282 --  APTT -- -- 77* -- -- -- -- --  LABPROT 47.1* 51.9* -- -- -- -- -- 42.7*  INR 5.00* 5.66* -- -- -- -- -- 4.41*  HEPARINUNFRC -- -- -- -- -- -- -- --  CREATININE 0.67 -- 0.61 -- -- -- 0.66 --  CKTOTAL -- -- -- 77 82 54 -- --  CKMB -- -- -- 2.6 2.5 1.7 -- --  TROPONINI -- -- -- <0.30 <0.30 <0.30 -- --    Estimated Creatinine Clearance: 56.4 ml/min (by C-G formula based on Cr of 0.67).   Medical History: Past Medical History  Diagnosis Date  . Myocardial infarction     times 2  . CHF (congestive heart failure)   . Stroke   . Deep vein thrombosis     Phlebitis  . Hyperlipidemia   . Sleep apnea   . 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 Coronary artery disease     Assessment: 66 y.o. F on warfarin PTA for hx DVT & CVA and admitted with a SUPRAtherapeutic INR on 5/25. INR still SUPRAtherapeutic this a.m and trended up despite no dose yet this admission (INR 5 today). The patient claims she did not take her dose 5/25-- so her last dose would have been on 5/24. Her dose appears to be adjusted frequently -- she claims her last known dose was 3 mg daily EXCEPT for 1.5 mg on Wednesdays only -- however the patient was slightly  confused upon conversation. Hgb/Hct/Plt trended down slightly -- no s/sx of bleeding noted.  The patient was re-educated on warfarin 5/26.  Goal of Therapy:  INR 2-3 Monitor platelets by anticoagulation protocol: Yes   Plan:  1. Hold warfarin dose today 2. Will continue to monitor for any signs/symptoms of bleeding and will follow up with PT/INR in the a.m.   Celedonio Miyamoto, PharmD, BCPS Clinical Pharmacist Pager [phone]  09/14/2011 8:49 AM

## 2011-09-14 NOTE — Progress Notes (Signed)
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  Followed by MW as outpt (last seen Jan 2012).   PMHx CVA, CAD, DVT on chronic coumadin, OSA, hyperlipidemia  Events Since Admission: Placed on nimvs @0620   Current Status:  Did not need BPAP since yesterday afternoon.  Breathing better.  Decreased chest congestion, but still has cough with sputum.  Slept well.  Vital Signs: Temp:  [98 F (36.7 C)-98.9 F (37.2 C)] 98.1 F (36.7 C) (05/27 0702) Pulse Rate:  [77-100] 100  (05/27 0702) Resp:  [18-20] 18  (05/27 0702) BP: (104-122)/(46-70) 104/62 mmHg (05/27 0702) SpO2:  [91 %-92 %] 92 % (05/27 0702)  Physical Examination: General - No distress, speaking in full sentences HEENT - no LAN Cardiac - s1s2 regular, no murmur Chest - no wheeze/rales Abd - soft, nontender Ext - no edema Neuro - A&O x 3, normal strength  CBC    Component Value Date/Time   WBC 8.5 09/14/2011 0500   RBC 3.61* 09/14/2011 0500   HGB 10.5* 09/14/2011 0500   HCT 32.3* 09/14/2011 0500   PLT 249 09/14/2011 0500   MCV 89.5 09/14/2011 0500   MCH 29.1 09/14/2011 0500   MCHC 32.5 09/14/2011 0500   RDW 14.6 09/14/2011 0500   LYMPHSABS 4.5* 09/12/2011 0650   MONOABS 1.3* 09/12/2011 0650   EOSABS 0.1 09/12/2011 0650   BASOSABS 0.1 09/12/2011 0650   BMET    Component Value Date/Time   NA 139 09/14/2011 0500   K 4.2 09/14/2011 0500   CL 102 09/14/2011 0500   CO2 33* 09/14/2011 0500   GLUCOSE 95 09/14/2011 0500   BUN 11 09/14/2011 0500   CREATININE 0.67 09/14/2011 0500   CALCIUM 9.2 09/14/2011 0500   GFRNONAA 89* 09/14/2011 0500   GFRAA >90 09/14/2011 0500   Lab Results  Component Value Date   ALT 21 09/12/2011   AST 37 09/12/2011   ALKPHOS 36* 09/12/2011   BILITOT 0.5 09/12/2011   ABG    Component Value Date/Time   PHART 7.389 09/12/2011 1300   PCO2ART 70.8* 09/12/2011 1300   PO2ART 73.5* 09/12/2011 1300   HCO3 41.8* 09/12/2011 1300   TCO2 43.9 09/12/2011 1300   O2SAT 95.8 09/12/2011 1300    Ct Head Wo Contrast  09/12/2011  *RADIOLOGY REPORT*  Clinical Data: Altered mental status.  Prior CVA.  CT HEAD WITHOUT CONTRAST  Technique:  Contiguous axial images were obtained from the base of the skull through the vertex without contrast.  Comparison: 12/17/2009 CT examination from [facility].  Findings: The brain stem, cerebellum, cerebral peduncles, thalami, basal ganglia, basilar cisterns, and ventricular system appear unremarkable.  No intracranial hemorrhage, mass lesion, or acute infarction is identified.  Periventricular and corona radiata white matter hypodensities are most compatible with chronic ischemic microvascular white matter disease.  IMPRESSION:  1.  Mild chronic ischemic microvascular white matter disease.  No acute intracranial findings are observed.  Original Report Authenticated By: Dellia Cloud, M.D.    ASSESSMENT AND PLAN  PULMONARY   A:  Hypercarbic resp failure acute on chronic likely from AECOPD>>much improved 5/26. P:   -d/c bipap. -bd's>>resume spiriva, advair. -change prednisone to 40 mg  Bid with rapid taper over the next 5-7 days. -empirical  abx.  CARDIOVASCULAR   A: hx of cad, DVT, hyperlipidemia.  Allergy to ASA. P:  -coumadin per pharmacy  RENAL   Foley:  5/25>>5/26  A:  Hypokalemia .  Lab 09/14/11 0500 09/12/11 2357 09/12/11 1012  K 4.2 3.1* 3.6     P:   -f/u and replace as needed  GASTROINTESTINAL   A:  nutrtion P:   -heart healthy diet  HEMATOLOGIC  A:  hypercoagulable  P:  -pharmacy to dose coumadin  INFECTIOUS  Cultures: 5/25 bc x 2>> 5/25 uc>> 5/25 sputum>>  Antibiotics: 5/25 avelox>>  A:  Acute purulent bronchitis P:   -see flow  ENDOCRINE CBG (last  3)   Basename 09/12/11 1244  GLUCAP 150*    A: no hx of DM P:   Monitor CBG while on steroids   NEUROLOGIC  A:  Hx stroke.  No acute findings on CT head 5/25.  Mental status improved with BPAP. P:   -monitor clinically  BEST PRACTICE / DISPOSITION - Level of Care:  Med-surg - Primary Service:  pccm - Consultants:  none - Code Status:  Full but states she does not want intubation 5/25. - Diet:  Heart healthy - DVT Px:  Coumadin per pharmacy - GI Px:  ppi - Skin Integrity:  intact - Social / Family:  Husband admitted same day(5/25) to [facility]. Family at bedside 5/27. Pt and family desire full code status. SM  Brett Canales Minor ACNP [facility] PCCM Pager [phone] till 3 pm If no answer page [phone] 09/14/2011, 12:05 PM  Patient stabilizing rapidly, will taper steroids quickly, change avelox to PO and will likely be ready to d/c in the next 2-3 days if no fever with change of avelox to PO.  Alyson Reedy, M.D. [facility] Pulmonary/Critical Care Medicine. Pager: [phone]. After hours pager: [phone].

## 2011-09-14 NOTE — Progress Notes (Signed)
@  1230 Dr. Minor notified of todays low blood pressures and that lopressor and Imdur was held because of it today.  Orders given to hold both meds for bp less than 100 (systolic) and / or pulse  Less than 60.

## 2011-09-15 LAB — CBC
HCT: 33.9 % — ABNORMAL LOW (ref 36.0–46.0)
Hemoglobin: 10.9 g/dL — ABNORMAL LOW (ref 12.0–15.0)
MCHC: 32.2 g/dL (ref 30.0–36.0)
RBC: 3.79 MIL/uL — ABNORMAL LOW (ref 3.87–5.11)
WBC: 6.3 10*3/uL (ref 4.0–10.5)

## 2011-09-15 LAB — BASIC METABOLIC PANEL
Chloride: 102 mEq/L (ref 96–112)
GFR calc Af Amer: >90 mL/min (ref >90)
GFR calc non Af Amer: 87 mL/min — ABNORMAL LOW (ref >90)
Potassium: 4.4 mEq/L (ref 3.5–5.1)
Sodium: 140 mEq/L (ref 135–145)

## 2011-09-15 LAB — BLOOD GAS, ARTERIAL
Bicarbonate: 30.4 mEq/L — ABNORMAL HIGH (ref 20.0–24.0)
Drawn by: 20517
O2 Content: 3 L/min
O2 Saturation: 95.4 %
pCO2 arterial: 43.3 mmHg (ref 35.0–45.0)
pH, Arterial: 7.461 — ABNORMAL HIGH (ref 7.350–7.400)
pO2, Arterial: 68.6 mmHg — ABNORMAL LOW (ref 80.0–100.0)

## 2011-09-15 LAB — PROTIME-INR
INR: 2.23 — ABNORMAL HIGH (ref 0.00–1.49)
Prothrombin Time: 25.1 seconds — ABNORMAL HIGH (ref 11.6–15.2)

## 2011-09-15 MED ORDER — WARFARIN SODIUM 1 MG PO TABS
1.5000 mg | ORAL_TABLET | Freq: Once | ORAL | Status: AC
  Administered 2011-09-15: 1.5 mg via ORAL
  Filled 2011-09-15: qty 1

## 2011-09-15 MED ORDER — PREDNISONE 20 MG PO TABS
40.0000 mg | ORAL_TABLET | Freq: Every day | ORAL | Status: DC
  Administered 2011-09-16: 40 mg via ORAL
  Filled 2011-09-15 (×2): qty 2

## 2011-09-15 NOTE — Progress Notes (Signed)
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  Followed by MW as outpt (last seen Jan 2012).  PMHx CVA, CAD, DVT on chronic coumadin, OSA, hyperlipidemia. BAseline fuonctional status: gets help with cooking, bathing and toilet but able to self feed, goes to church, moves around with walker, cognitively intact and lives with husband and daughter  Events Since Admission: Placed on nimvs @0620    SUBJECTIVE/OVERNIGHT/INTERVAL HX and Current Status:   Feels well. RN feels very deconditioned Wants to know when she can go home  BAseline fuonctional status: gets help with cooking, bathing and toilet but able to self feed, goes to church, moves around with walker, cognitively intact and lives with husband and daughter   Vital Signs: Temp:  [98 F (36.7 C)-98.7 F (37.1 C)] 98.6 F (37 C) (05/28 1400) Pulse Rate:  [59-79] 59  (05/28 1400) Resp:  [16-20] 20  (05/28 1400) BP: (94-124)/(57-74) 94/57 mmHg (05/28 1400) SpO2:  [92 %-96 %] 94 % (05/28 1400) FiO2 (%):  [96 %] 96 % (05/27 1758)  Physical Examination: General - No distress, speaking in full sentences HEENT - no LAN Cardiac - s1s2 regular, no murmur Chest - no wheeze/rales Abd - soft, nontender Ext - no edema Neuro - A&O x 3, CAM-ICU neg for delirium. RASS 0. Feels well CBC    Component Value Date/Time   WBC 6.3 09/15/2011 0557   RBC 3.79* 09/15/2011 0557   HGB 10.9* 09/15/2011 0557   HCT 33.9* 09/15/2011 0557   PLT 264 09/15/2011 0557   MCV 89.4 09/15/2011 0557   MCH 28.8 09/15/2011 0557   MCHC 32.2 09/15/2011 0557   RDW 15.0 09/15/2011 0557   LYMPHSABS 4.5* 09/12/2011 0650   MONOABS 1.3* 09/12/2011 0650   EOSABS 0.1 09/12/2011 0650   BASOSABS 0.1 09/12/2011 0650    BMET    Component Value Date/Time   NA 140 09/15/2011 0557   K 4.4 09/15/2011 0557   CL 102 09/15/2011 0557   CO2 33* 09/15/2011 0557   GLUCOSE 101* 09/15/2011 0557   BUN 13 09/15/2011 0557   CREATININE 0.71 09/15/2011 0557   CALCIUM 9.1 09/15/2011 0557   GFRNONAA 87* 09/15/2011 0557   GFRAA >90 09/15/2011 0557   Lab Results  Component Value Date   ALT 21 09/12/2011   AST 37 09/12/2011   ALKPHOS 36* 09/12/2011   BILITOT 0.5 09/12/2011   ABG    Component Value Date/Time   PHART 7.389 09/12/2011 1300   PCO2ART 70.8* 09/12/2011 1300   PO2ART 73.5* 09/12/2011 1300   HCO3 41.8* 09/12/2011 1300   TCO2 43.9 09/12/2011 1300   O2SAT 95.8 09/12/2011 1300    No results found.  ASSESSMENT AND PLAN  PULMONARY   A:  Hypercarbic resp failure acute on chronic likely from AECOPD>>much improved 5/26.  . On 5/28 she subjectively feels close to discharge capacity P:   -spiriva, advair. -prednisone to 40 mg  daily rapid taper over the next 14 days -empirical abx.  CARDIOVASCULAR   A: hx of cad, DVT, hyperlipidemia.  Allergy to ASA. P:  -coumadin per pharmacy  RENAL   Foley:  5/25>>5/26  A:  Hypokalemia .  Lab 09/15/11 0557 09/14/11 0500 09/12/11 2357  K 4.4 4.2 3.1*  P:   -f/u and replace as needed  GASTROINTESTINAL   A:  nutrtion P:   -heart healthy diet  HEMATOLOGIC  A:  hypercoagulable  P:  -pharmacy to dose coumadin  INFECTIOUS  Cultures: 5/25 bc x 2>> 5/25 uc>> 5/25 sputum>>  Antibiotics: 5/25 avelox>>  A:  Acute purulent bronchitis P:   -see flow  ENDOCRINE CBG (last 3)  No results found for this basename: GLUCAP:3 in the last 72 hours  A: no hx of DM P:   Monitor CBG while on steroids   NEUROLOGIC  A:  Hx stroke.  No acute findings on CT head 5/25.  Mental status improved with BPAP. P:   -monitor clinically   GLOBAL  - will call PT consult and inpatient rehab consult: and depending on their input and check abg will aim dc home  09/16/11. OPD fu Dr Sherene Sires. She is a high risk copd patient  BEST PRACTICE / DISPOSITION - Level of Care:  Med-surg - Primary Service:  pccm - Consultants:  none - Code Status:  Full but states she does not want intubation 5/25. - Diet:  Heart healthy - DVT Px:  Coumadin per pharmacy - GI Px:  ppi - Skin Integrity:  intact - Social / Family:  Husband admitted same day(5/25) to [facility]. Family at bedside 5/27. Pt and family desire full code status.

## 2011-09-15 NOTE — Consult Note (Signed)
ANTICOAGULATION CONSULT NOTE - Follow Up Consult  Pharmacy Consult for Coumadin Indication: DVT and stroke  Allergies  Allergen Reactions  . Aspirin Other (See Comments)    Son does not remember reaction  . Penicillins Hives and Itching    Patient Measurements: Height: 5\' 3"  (160 cm) Weight: 129 lb 10.1 oz (58.8 kg) IBW/kg (Calculated) : 52.4    Vital Signs: Temp: 98 F (36.7 C) (05/28 0756) Temp src: Oral (05/28 0756) BP: 110/61 mmHg (05/28 0756) Pulse Rate: 79  (05/28 0756)  Labs:  Basename 09/15/11 0557 09/14/11 0500 09/13/11 0958 09/12/11 2357 09/12/11 2356 09/12/11 1753  HGB 10.9* 10.5* -- -- -- --  HCT 33.9* 32.3* -- 32.5* -- --  PLT 264 249 -- 243 -- --  APTT -- -- -- 77* -- --  LABPROT 25.1* 47.1* 51.9* -- -- --  INR 2.23* 5.00* 5.66* -- -- --  HEPARINUNFRC -- -- -- -- -- --  CREATININE 0.71 0.67 -- 0.61 -- --  CKTOTAL -- -- -- -- 77 82  CKMB -- -- -- -- 2.6 2.5  TROPONINI -- -- -- -- <0.30 <0.30   Estimated Creatinine Clearance: 56.4 ml/min (by C-G formula based on Cr of 0.71).   Medications:  Prescriptions prior to admission  Medication Sig Dispense Refill  . atorvastatin (LIPITOR) 40 MG tablet Take 20 mg by mouth daily.       Marland Kitchen buPROPion (WELLBUTRIN XL) 150 MG 24 hr tablet Take 150 mg by mouth daily.       . citalopram (CELEXA) 20 MG tablet Take 20 mg by mouth daily.       . clonazePAM (KLONOPIN) 0.5 MG tablet Take 1 mg by mouth 3 (three) times daily as needed. For nervousness      . fenofibrate 160 MG tablet Take 160 mg by mouth daily.       . Fluticasone-Salmeterol (ADVAIR) 100-50 MCG/DOSE AEPB Inhale 1 puff into the lungs every 12 (twelve) hours.      . folic acid (FOLVITE) 1 MG tablet Take 1 mg by mouth daily.       . furosemide (LASIX) 20 MG tablet Take 20 mg by mouth every other day. On Monday, Wednesday and Friday      . gabapentin (NEURONTIN) 300 MG capsule Take 300 mg by mouth 3 (three) times daily.       Marland Kitchen ipratropium-albuterol (DUONEB)  0.5-2.5 (3) MG/3ML SOLN Take 3 mLs by nebulization every 6 (six) hours as needed. For shortness of breath or wheezing      . isosorbide mononitrate (IMDUR) 30 MG 24 hr tablet Take 30 mg by mouth daily.      . metoprolol tartrate (LOPRESSOR) 25 MG tablet Take 12.5 mg by mouth 2 (two) times daily.      . montelukast (SINGULAIR) 10 MG tablet Take 10 mg by mouth at bedtime.       Marland Kitchen omeprazole (PRILOSEC) 20 MG capsule Take 20 mg by mouth.      . oxyCODONE-acetaminophen (PERCOCET) 10-325 MG per tablet Take 1 tablet by mouth every 8 (eight) hours as needed. For pain      . pantoprazole (PROTONIX) 40 MG tablet Take 40 mg by mouth daily.       . promethazine (PHENERGAN) 25 MG tablet Take 12.5-25 mg by mouth every 6 (six) hours as needed. For nausea      . tiotropium (SPIRIVA) 18 MCG inhalation capsule Place 18 mcg into inhaler and inhale daily.       Marland Kitchen  traMADol (ULTRAM) 50 MG tablet Take 50 mg by mouth.      . Vitamin D, Ergocalciferol, (DRISDOL) 50000 UNITS CAPS Take 50,000 Units by mouth every 7 (seven) days. On Mondays      . warfarin (COUMADIN) 3 MG tablet Take 1.5-3 mg by mouth daily. The patient states that her most current regimen is 1 tablet (3 mg) daily EXCEPT for 0.5 tablet (1.5 mg) on Wednesday only.      . clonazePAM (KLONOPIN) 0.5 MG tablet Take 1 tablet (0.5 mg total) by mouth 3 (three) times daily.  30 tablet  0  . nitroGLYCERIN (NITROSTAT) 0.4 MG SL tablet Place 0.4 mg under the tongue every 5 (five) minutes as needed. For chest pain      . OXYGEN-HELIUM IN Inhale into the lungs continuous. 2 liters /[place]        Scheduled:    . albuterol  2.5 mg Nebulization TID  . buPROPion  150 mg Oral Daily  . citalopram  20 mg Oral Daily  . fenofibrate  160 mg Oral Daily  . Fluticasone-Salmeterol  1 puff Inhalation BID  . furosemide  20 mg Oral Q M,W,F  . isosorbide mononitrate  30 mg Oral Daily  . metoprolol tartrate  12.5 mg Oral BID  . moxifloxacin  400 mg Oral Daily  . pantoprazole  40 mg Oral  Q1200  . predniSONE  40 mg Oral BID WC  . tiotropium  18 mcg Inhalation Daily  . Warfarin - Pharmacist Dosing Inpatient   Does not apply q1800  . DISCONTD: albuterol  2.5 mg Nebulization TID  . DISCONTD: Fluticasone-Salmeterol  1 puff Inhalation BID  . DISCONTD: isosorbide mononitrate  30 mg Oral Daily  . DISCONTD: methylPREDNISolone (SOLU-MEDROL) injection  40 mg Intravenous Q12H  . DISCONTD: metoprolol tartrate  12.5 mg Oral BID  . DISCONTD: moxifloxacin  400 mg Intravenous Q24H   Anti-infectives     Start     Dose/Rate Route Frequency Ordered Stop   09/15/11 1000   moxifloxacin (AVELOX) tablet 400 mg        400 mg Oral Daily 09/14/11 1715     09/12/11 1030   moxifloxacin (AVELOX) IVPB 400 mg  Status:  Discontinued        400 mg 250 mL/hr over 60 Minutes Intravenous Every 24 hours 09/12/11 0941 09/14/11 1715          Assessment:  66 y/o female on chronic Coumadin for history of DVT and CVA.  INR dropped, without Vit K, from 5 >> 2.23.  WBC/Hgb/Hct/Plts:  6.3/10.9/33.9/264 (05/28 0557)  No evidence of bleeding or complications.  Patient remains on Avelox.  Goal of Therapy:   INR 2-3   Plan:   Coumadin 1.5 mg po today. Daily INR's, CBC. Continue Avelox as ordered.  Avelox does not require renal dosing adjustments.  Quinn Quam, Elisha Headland, Pharm.D. 09/15/2011 10:27 AM

## 2011-09-16 ENCOUNTER — Encounter (HOSPITAL_COMMUNITY): Payer: Self-pay | Admitting: Physical Medicine and Rehabilitation

## 2011-09-16 DIAGNOSIS — R6251 Failure to thrive (child): Secondary | ICD-10-CM

## 2011-09-16 LAB — CULTURE, RESPIRATORY W GRAM STAIN

## 2011-09-16 LAB — PROTIME-INR: INR: 1.37 (ref 0.00–1.49)

## 2011-09-16 MED ORDER — PREDNISONE 1 MG PO TABS: ORAL_TABLET | ORAL | Status: DC

## 2011-09-16 MED ORDER — MOXIFLOXACIN HCL 400 MG PO TABS: 400.0000 mg | ORAL_TABLET | Freq: Every day | ORAL | Status: DC

## 2011-09-16 MED ORDER — CIPROFLOXACIN HCL 500 MG PO TABS
500.0000 mg | ORAL_TABLET | Freq: Two times a day (BID) | ORAL | Status: DC
  Administered 2011-09-16: 500 mg via ORAL
  Filled 2011-09-16 (×3): qty 1

## 2011-09-16 MED ORDER — WARFARIN SODIUM 3 MG PO TABS
3.0000 mg | ORAL_TABLET | Freq: Once | ORAL | Status: DC
  Filled 2011-09-16: qty 1

## 2011-09-16 MED ORDER — CIPROFLOXACIN HCL 500 MG PO TABS: 500.0000 mg | ORAL_TABLET | Freq: Two times a day (BID) | ORAL | Status: AC

## 2011-09-16 MED ORDER — WARFARIN SODIUM 3 MG PO TABS: 1.5000 mg | ORAL_TABLET | Freq: Every day | ORAL | Status: DC

## 2011-09-16 NOTE — Progress Notes (Addendum)
Referral received for SNF. Chart reviewed and CSW has spoken with RNCM who indicates that patient is for DC to   home with Home Health and DME. Patient does not want SNF.   CSW to sign off. Please re-consult if CSW needs arise.  Lorri Frederick. Jaci Lazier, MSW  [phone]

## 2011-09-16 NOTE — Consult Note (Signed)
Physical Medicine and Rehabilitation Consult Reason for Consult: deconditioning Referring Physician:  Dr. Marchelle Gearing  HPI:  Aimee Sanders is an 66 y.o. Female with history of CAD, COPD-O2 dependent, admitted with acute on chronic hypoxic/hypercarbic RF requiring BIPAP  to AECOPD. Treated with IV antibiotics and steroids with improvement of symptoms.  For PT evaluation today. Patient reports that she's been walking to the BR with nursing.  MD recommending CIR.   Review of Systems  Eyes: Negative for blurred vision and double vision.  Respiratory: Negative for cough and shortness of breath.   Cardiovascular: Positive for chest pain (diffuse upper chest wall pain.). Negative for palpitations.  Gastrointestinal: Negative for nausea and vomiting.  Musculoskeletal: Positive for back pain (chronic back pain) and joint pain (chronic right hip pain).  Neurological: Positive for headaches (constant for the past two days). Negative for sensory change, speech change and focal weakness.   Past Medical History  Diagnosis Date  . Myocardial infarction     times 2  . CHF (congestive heart failure)   . Stroke   . Deep vein thrombosis     Phlebitis  . Hyperlipidemia   . Sleep apnea   . 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 Coronary artery disease   . Chronic pain     due to prior hip fracture and DDD back   Past Surgical History  Procedure Date  . Appendectomy   . Back surgery   . Cholecystectomy   . Coronary stent placement   . Breast surgery     benign tumors   Family History  Problem Relation Age of Onset  . Hypertension Mother   . Breast cancer Mother   . Rheum arthritis Maternal Grandmother    Social History:  Married. Independent for ambulating with RW. She reports that she has been smoking Cigarettes.  She has a 40 pack-year smoking history. She does not have any smokeless tobacco history on file. She reports  that she does not drink alcohol or use illicit drugs. Husband has cancer.  Daughter currently living with them and does housework and can assist as needed.  Allergies  Allergen Reactions  . Aspirin Other (See Comments)    Son does not remember reaction  . Penicillins Hives and Itching   Medications Prior to Admission  Medication Sig Dispense Refill  . atorvastatin (LIPITOR) 40 MG tablet Take 20 mg by mouth daily.       Marland Kitchen buPROPion (WELLBUTRIN XL) 150 MG 24 hr tablet Take 150 mg by mouth daily.       . citalopram (CELEXA) 20 MG tablet Take 20 mg by mouth daily.       . clonazePAM (KLONOPIN) 0.5 MG tablet Take 1 mg by mouth 3 (three) times daily as needed. For nervousness      . fenofibrate 160 MG tablet Take 160 mg by mouth daily.       . Fluticasone-Salmeterol (ADVAIR) 100-50 MCG/DOSE AEPB Inhale 1 puff into the lungs every 12 (twelve) hours.      . folic acid (FOLVITE) 1 MG tablet Take 1 mg by mouth daily.       . furosemide (LASIX) 20 MG tablet Take 20 mg by mouth every other day. On Monday, Wednesday and Friday      . gabapentin (NEURONTIN) 300 MG capsule Take 300 mg by mouth 3 (three) times daily.       Marland Kitchen ipratropium-albuterol (DUONEB)  0.5-2.5 (3) MG/3ML SOLN Take 3 mLs by nebulization every 6 (six) hours as needed. For shortness of breath or wheezing      . isosorbide mononitrate (IMDUR) 30 MG 24 hr tablet Take 30 mg by mouth daily.      . metoprolol tartrate (LOPRESSOR) 25 MG tablet Take 12.5 mg by mouth 2 (two) times daily.      . montelukast (SINGULAIR) 10 MG tablet Take 10 mg by mouth at bedtime.       Marland Kitchen omeprazole (PRILOSEC) 20 MG capsule Take 20 mg by mouth.      . oxyCODONE-acetaminophen (PERCOCET) 10-325 MG per tablet Take 1 tablet by mouth every 8 (eight) hours as needed. For pain      . pantoprazole (PROTONIX) 40 MG tablet Take 40 mg by mouth daily.       . promethazine (PHENERGAN) 25 MG tablet Take 12.5-25 mg by mouth every 6 (six) hours as needed. For nausea      .  tiotropium (SPIRIVA) 18 MCG inhalation capsule Place 18 mcg into inhaler and inhale daily.       . traMADol (ULTRAM) 50 MG tablet Take 50 mg by mouth.      . Vitamin D, Ergocalciferol, (DRISDOL) 50000 UNITS CAPS Take 50,000 Units by mouth every 7 (seven) days. On Mondays      . warfarin (COUMADIN) 3 MG tablet Take 1.5-3 mg by mouth daily. The patient states that her most current regimen is 1 tablet (3 mg) daily EXCEPT for 0.5 tablet (1.5 mg) on Wednesday only.      . clonazePAM (KLONOPIN) 0.5 MG tablet Take 1 tablet (0.5 mg total) by mouth 3 (three) times daily.  30 tablet  0  . nitroGLYCERIN (NITROSTAT) 0.4 MG SL tablet Place 0.4 mg under the tongue every 5 (five) minutes as needed. For chest pain      . OXYGEN-HELIUM IN Inhale into the lungs continuous. 2 liters /[place]         Home:  One level home with one step at entry.    Functional History: Independent for  Most ADLs-needed assist to wash hair Does not drive. Rolling walker for ambulation.   Able to navigate stairs with HHA. Functional Status:  Mobility:          ADL:    Cognition: Cognition Orientation Level: Oriented X4    Blood pressure 110/60, pulse 73, temperature 98.5 F (36.9 C), temperature source Oral, resp. rate 20, height 5\' 3"  (1.6 m), weight 58.8 kg (129 lb 10.1 oz), SpO2 95.00%. Physical Exam  Nursing note and vitals reviewed. Constitutional: She is oriented to person, place, and time. She appears well-developed and well-nourished. She has a sickly appearance.  HENT:  Head: Normocephalic and atraumatic.  Eyes:       Horizontal nystagmus.  Neck: Normal range of motion. Neck supple.  Cardiovascular: Normal rate and regular rhythm.   Pulmonary/Chest: Effort normal. She has no wheezes. She has rhonchi.  Abdominal: Soft. Bowel sounds are normal.  Musculoskeletal: She exhibits no edema and no tenderness.  Neurological: She is alert and oriented to person, place, and time.  Skin: Skin is dry.  Psychiatric:  She has a normal mood and affect. Her behavior is normal. Thought content normal.    Results for orders placed during the hospital encounter of 09/12/11 (from the past 24 hour(s))  BLOOD GAS, ARTERIAL     Status: Abnormal   Collection Time   09/15/11  4:29 PM  Component Value Range   O2 Content 3.0     Delivery systems NASAL CANNULA     pH, Arterial 7.461 (*) 7.350 - 7.400    pCO2 arterial 43.3  35.0 - 45.0 (mmHg)   pO2, Arterial 68.6 (*) 80.0 - 100.0 (mmHg)   Bicarbonate 30.4 (*) 20.0 - 24.0 (mEq/L)   TCO2 31.7  0 - 100 (mmol/L)   Acid-Base Excess 6.5 (*) 0.0 - 2.0 (mmol/L)   O2 Saturation 95.4     Patient temperature 98.6     Collection site RIGHT RADIAL     Drawn by [id number]     Sample type ARTERIAL DRAW     Allens test (pass/fail) PASS  PASS   PROTIME-INR     Status: Abnormal   Collection Time   09/16/11  6:30 AM      Component Value Range   Prothrombin Time 17.1 (*) 11.6 - 15.2 (seconds)   INR 1.37  0.00 - 1.49    No results found.  Assessment/Plan: Diagnosis: failure to thrive  Patient is functioning close to baseline. Continue PT on acute until pt ready medically for dc home.  Could receive home health if still needed. Will defer formal conuslt.   Ivory Broad, MD

## 2011-09-16 NOTE — Progress Notes (Signed)
UR COMPLETED  

## 2011-09-16 NOTE — Progress Notes (Addendum)
Name: Aimee Sanders MRN: [id number] DOB: 1945-07-22    LOS: 4  Referring Provider:  EDP Reason for Referral:  Hypercarbic resp failure  PULMONARY / CRITICAL CARE MEDICINE  HPI:  66 yo female smoker admitted on 09/12/2011 with acute on chronic hypoxic/hypercapnic respiratory failure requiring BPAP 2nd to AECOPD.  Followed by MW as outpt (last seen Jan 2012).  PMHx CVA, CAD, DVT on chronic coumadin, OSA, hyperlipidemia. BAseline fuonctional status: gets help with cooking, bathing and toilet but able to self feed, goes to church, moves around with walker, cognitively intact and lives with husband and daughter  Events Since Admission: Placed on nimvs @0620    SUBJECTIVE/OVERNIGHT/INTERVAL HX and Current Status:   Feels well. RN feels very deconditioned. Seen by inpatient rehab and they have learned that she is ambulating with assist in the room. She is keen to go  Home but might be willing to consider inpatient rehab    BAseline fuonctional status: gets help with cooking, bathing and toilet but able to self feed, goes to church, moves around with walker, cognitively intact and lives with husband and daughter   Vital Signs: Temp:  [98.1 F (36.7 C)-98.6 F (37 C)] 98.5 F (36.9 C) (05/29 0550) Pulse Rate:  [59-73] 73  (05/29 0550) Resp:  [20] 20  (05/29 0550) BP: (94-114)/(57-68) 110/60 mmHg (05/29 0550) SpO2:  [94 %-95 %] 95 % (05/29 0550)  Physical Examination: General - No distress, speaking in full sentences HEENT - no LAN Cardiac - s1s2 regular, no murmur Chest - no wheeze/rales Abd - soft, nontender Ext - no edema Neuro - A&O x 3, CAM-ICU neg for delirium. RASS 0. Feels well CBC    Component Value Date/Time   WBC 6.3 09/15/2011 0557   RBC 3.79* 09/15/2011 0557   HGB 10.9* 09/15/2011 0557   HCT 33.9* 09/15/2011 0557   PLT 264 09/15/2011 0557   MCV 89.4 09/15/2011 0557   MCH 28.8 09/15/2011 0557   MCHC 32.2 09/15/2011 0557   RDW 15.0 09/15/2011 0557   LYMPHSABS 4.5*  09/12/2011 0650   MONOABS 1.3* 09/12/2011 0650   EOSABS 0.1 09/12/2011 0650   BASOSABS 0.1 09/12/2011 0650   BMET    Component Value Date/Time   NA 140 09/15/2011 0557   K 4.4 09/15/2011 0557   CL 102 09/15/2011 0557   CO2 33* 09/15/2011 0557   GLUCOSE 101* 09/15/2011 0557   BUN 13 09/15/2011 0557   CREATININE 0.71 09/15/2011 0557   CALCIUM 9.1 09/15/2011 0557   GFRNONAA 87* 09/15/2011 0557   GFRAA >90 09/15/2011 0557   Lab Results  Component Value Date   ALT 21 09/12/2011   AST 37 09/12/2011   ALKPHOS 36* 09/12/2011   BILITOT 0.5 09/12/2011   ABG    Component Value Date/Time   PHART 7.461* 09/15/2011 1629   PCO2ART 43.3 09/15/2011 1629   PO2ART 68.6* 09/15/2011 1629   HCO3 30.4* 09/15/2011 1629   TCO2 31.7 09/15/2011 1629   O2SAT 95.4 09/15/2011 1629    Lab 09/12/11 1015  PROCALCITON <0.10   Results for orders placed during the hospital encounter of 09/12/11  URINE CULTURE     Status: Normal   Collection Time   09/12/11 10:06 AM      Component Value Range Status Comment   Specimen Description URINE, CATHETERIZED   Final    Special Requests NONE   Final    Culture  Setup Time [id number]   Final    Colony Count 55,000 COLONIES/ML  Final    Culture     Final    Value: DIPHTHEROIDS(CORYNEBACTERIUM SPECIES)     Note: Standardized susceptibility testing for this organism is not available.   Report Status 09/14/2011 FINAL   Final   CULTURE, BLOOD (ROUTINE X 2)     Status: Normal (Preliminary result)   Collection Time   09/12/11 10:15 AM      Component Value Range Status Comment   Specimen Description BLOOD RIGHT ARM   Final    Special Requests BOTTLES DRAWN AEROBIC AND ANAEROBIC 10CC   Final    Culture  Setup Time [id number]   Final    Culture     Final    Value:        BLOOD CULTURE RECEIVED NO GROWTH TO DATE CULTURE WILL BE HELD FOR 5 DAYS BEFORE ISSUING A FINAL NEGATIVE REPORT   Report Status PENDING   Incomplete   CULTURE, BLOOD (ROUTINE X 2)     Status: Normal (Preliminary  result)   Collection Time   09/12/11 10:20 AM      Component Value Range Status Comment   Specimen Description BLOOD RIGHT ARM   Final    Special Requests BOTTLES DRAWN AEROBIC AND ANAEROBIC 10CC   Final    Culture  Setup Time [id number]   Final    Culture     Final    Value:        BLOOD CULTURE RECEIVED NO GROWTH TO DATE CULTURE WILL BE HELD FOR 5 DAYS BEFORE ISSUING A FINAL NEGATIVE REPORT   Report Status PENDING   Incomplete   MRSA PCR SCREENING     Status: Normal   Collection Time   09/12/11 12:45 PM      Component Value Range Status Comment   MRSA by PCR NEGATIVE  NEGATIVE  Final   CULTURE, RESPIRATORY     Status: Normal   Collection Time   09/14/11  1:13 AM      Component Value Range Status Comment   Specimen Description TRACHEAL ASPIRATE   Final    Special Requests Normal   Final    Gram Stain     Final    Value: FEW WBC PRESENT,BOTH PMN AND MONONUCLEAR     FEW SQUAMOUS EPITHELIAL CELLS PRESENT     FEW GRAM NEGATIVE RODS   Culture ABUNDANT PSEUDOMONAS AERUGINOSA   Final    Report Status 09/16/2011 FINAL   Final    Organism ID, Bacteria PSEUDOMONAS AERUGINOSA   Final     No results found.  ASSESSMENT AND PLAN  PULMONARY   A:  Hypercarbic resp failure acute on chronic likely from AECOPD>>much improved 5/26.  . On 5/29 she subjectively feels at  discharge capacity. Sputum results showing pseudomonas P:   -spiriva, advair. -prednisone to 40 mg  daily rapid taper over the next 14 days -change abx to cipro and do another 8 days starting 09/16/2011   CARDIOVASCULAR   A: hx of cad, DVT, hyperlipidemia.  Allergy to ASA. P:  -coumadin per pharmacy  RENAL   Foley:  5/25>>5/26  A:  Hypokalemia .  Lab 09/15/11 0557 09/14/11 0500 09/12/11 2357  K 4.4 4.2 3.1*     P:   -f/u and replace as needed  GASTROINTESTINAL   A:  nutrtion P:   -heart healthy diet  HEMATOLOGIC  A:  hypercoagulable  P:  -pharmacy to dose  coumadin  INFECTIOUS  Cultures: 5/25 bc x 2>> 5/25 uc>> 5/25 sputum>>  Antibiotics: 5/25  avelox>>  A:  Acute purulent bronchitis P:   -see flow  ENDOCRINE CBG (last 3)  No results found for this basename: GLUCAP:3 in the last 72 hours  A: no hx of DM P:   Monitor CBG while on steroids   NEUROLOGIC  A:  Hx stroke.  No acute findings on CT head 5/25.  Mental status improved with BPAP. P:   -monitor clinically   GLOBAL  - home today if PT ok with her ambulatory capacity  BEST PRACTICE / DISPOSITION - Level of Care:  Med-surg - Primary Service:  pccm - Consultants:  none - Code Status:  Full but states she does not want intubation 5/25. - Diet:  Heart healthy - DVT Px:  Coumadin per pharmacy - GI Px:  ppi - Skin Integrity:  intact - Social / Family:  Husband admitted same day(5/25) to [facility]. Family at bedside 5/27. Pt and family desire full code status.   Dr. Kalman Shan, M.D., [facility].C.P Pulmonary and Critical Care Medicine Staff Physician [facility] [facility] Pager: [phone], If no answer or between  15:00h - 7:00h: call [phone]  09/16/2011 9:10 AM

## 2011-09-16 NOTE — Discharge Summary (Signed)
Physician Discharge Summary  Patient ID: Aimee Sanders MRN: [id number] DOB/AGE: 1946/03/01 66 y.o.  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 Followed by MW as outpt (last seen Jan 2012). PMHx CVA, CAD, DVT on chronic coumadin, OSA, hyperlipidemia. BAseline fuonctional status: gets help with cooking, bathing and toilet but able to self feed, goes to church, moves around with walker, cognitively intact and lives with husband and daughter   Hospital Course:  BAseline fuonctional status: gets help with cooking, bathing and toilet but able to self feed, goes to church, moves around with walker, cognitively intact and lives with husband and daughter  Vital Signs:  Temp: [98.1 F (36.7 C)-98.6 F (37 C)] 98.5 F (36.9 C) (05/29 0550)  Pulse Rate: [59-73] 73 (05/29 0550)  Resp: [20] 20 (05/29 0550)  BP: (94-114)/(57-68) 110/60 mmHg (05/29 0550)  SpO2: [94 %-95 %] 95 % (05/29 0550)  Physical Examination:  General - No distress, speaking in full sentences  HEENT - no LAN  Cardiac - s1s2 regular, no murmur  Chest - no wheeze/rales  Abd - soft, nontender  Ext - no edema  Neuro - A&O x 3, CAM-ICU neg for delirium. RASS 0. Feels well  CBC    Component  Value  Date/Time    WBC  6.3  09/15/2011 0557    RBC  3.79*  09/15/2011 0557    HGB  10.9*  09/15/2011 0557    HCT  33.9*  09/15/2011 0557    PLT  264  09/15/2011 0557    MCV  89.4  09/15/2011 0557    MCH  28.8  09/15/2011 0557    MCHC  32.2  09/15/2011 0557    RDW  15.0  09/15/2011 0557    LYMPHSABS  4.5*   09/12/2011 0650    MONOABS  1.3*  09/12/2011 0650    EOSABS  0.1  09/12/2011 0650    BASOSABS  0.1  09/12/2011 0650    BMET    Component  Value  Date/Time    NA  140  09/15/2011 0557    K  4.4  09/15/2011 0557    CL  102  09/15/2011 0557    CO2  33*  09/15/2011 0557    GLUCOSE  101*  09/15/2011 0557    BUN  13  09/15/2011 0557    CREATININE  0.71  09/15/2011 0557    CALCIUM  9.1  09/15/2011 0557    GFRNONAA  87*  09/15/2011 0557    GFRAA  >90  09/15/2011 0557    Lab Results   Component  Value  Date    ALT  21  09/12/2011    AST  37  09/12/2011    ALKPHOS  36*  09/12/2011    BILITOT  0.5  09/12/2011    ABG    Component  Value  Date/Time    PHART  7.461*  09/15/2011 1629    PCO2ART  43.3  09/15/2011 1629    PO2ART  68.6*  09/15/2011 1629    HCO3  30.4*  09/15/2011 1629    TCO2  31.7  09/15/2011 1629    O2SAT  95.4  09/15/2011 1629     Lab  09/12/11 1015   PROCALCITON  <0.10    Results for orders placed during the hospital encounter of 09/12/11   URINE CULTURE Status: Normal    Collection Time    09/12/11 10:06 AM   Component  Value  Range  Status  Comment    Specimen Description  URINE, CATHETERIZED   Final     Special Requests  NONE   Final     Culture Setup Time  [id number]   Final     Colony Count  55,000 COLONIES/ML   Final     Culture    Final     Value:  DIPHTHEROIDS(CORYNEBACTERIUM SPECIES)     Note: Standardized susceptibility testing for this organism is not available.    Report Status  09/14/2011 FINAL   Final    CULTURE, BLOOD (ROUTINE X 2) Status: Normal (Preliminary result)    Collection Time    09/12/11 10:15 AM   Component  Value  Range  Status  Comment    Specimen Description  BLOOD RIGHT ARM   Final     Special Requests  BOTTLES DRAWN AEROBIC AND ANAEROBIC 10CC   Final     Culture Setup Time  [id number]   Final     Culture    Final     Value:  BLOOD CULTURE RECEIVED NO GROWTH TO DATE CULTURE WILL BE HELD FOR 5 DAYS BEFORE ISSUING A FINAL NEGATIVE REPORT     Report Status  PENDING   Incomplete    CULTURE, BLOOD (ROUTINE X 2) Status: Normal (Preliminary result)    Collection Time    09/12/11 10:20 AM   Component  Value  Range  Status  Comment    Specimen Description  BLOOD RIGHT ARM   Final     Special Requests  BOTTLES DRAWN AEROBIC AND ANAEROBIC 10CC   Final     Culture Setup Time  [id number]   Final     Culture    Final     Value:  BLOOD CULTURE RECEIVED NO GROWTH TO DATE CULTURE WILL BE HELD FOR 5 DAYS BEFORE ISSUING A FINAL NEGATIVE REPORT    Report Status  PENDING   Incomplete    MRSA PCR SCREENING Status: Normal    Collection Time    09/12/11 12:45 PM   Component  Value  Range  Status  Comment    MRSA by PCR  NEGATIVE  NEGATIVE  Final    CULTURE, RESPIRATORY Status: Normal    Collection Time    09/14/11 1:13 AM   Component  Value  Range  Status  Comment    Specimen Description  TRACHEAL ASPIRATE   Final     Special Requests  Normal   Final     Gram Stain    Final     Value:  FEW WBC PRESENT,BOTH PMN AND MONONUCLEAR     FEW SQUAMOUS EPITHELIAL CELLS PRESENT     FEW GRAM NEGATIVE RODS    Culture  ABUNDANT PSEUDOMONAS AERUGINOSA   Final     Report Status  09/16/2011 FINAL   Final     Organism ID, Bacteria  PSEUDOMONAS AERUGINOSA   Final     No results found.  ASSESSMENT AND PLAN  PULMONARY  A: Hypercarbic resp failure acute on chronic likely from AECOPD>>much improved 5/26.  . On 5/29 she subjectively feels at discharge capacity. Sputum results  showing pseudomonas  P:  -spiriva, advair.  -prednisone to 40 mg daily rapid taper over the next 14 days  -change abx to cipro and do another 8 days starting 09/16/2011  CARDIOVASCULAR  A: hx of cad, DVT, hyperlipidemia. Allergy to ASA.  P:  -coumadin per pharmacy  RENAL  Foley: 5/25>>5/26  A: Hypokalemia  .   Lab  09/15/11 0557  09/14/11 0500  09/12/11 2357   K  4.4  4.2  3.1*    P:  -f/u and replace as needed  GASTROINTESTINAL  A: nutrtion  P:  -heart healthy diet    HEMATOLOGIC  A: hypercoagulable  P:  -pharmacy to dose coumadin  INFECTIOUS  Cultures:  5/25 bc x 2>>  5/25 uc>>  5/25 sputum>>  Antibiotics:  5/25 avelox>>  A: Acute purulent bronchitis  P:  -see flow  ENDOCRINE  CBG (last 3)  No results found for this basename: GLUCAP:3 in the last 72 hours  A: no hx of DM  P:  Monitor CBG while on steroids  NEUROLOGIC  A: Hx stroke. No acute findings on CT head 5/25. Mental status improved with BPAP.  P:  -monitor clinically  GLOBAL  - home today as okayed per physical therapy. BEST PRACTICE / DISPOSITION  - Level of Care: Med-surg  - Primary Service: pccm  - Consultants: none  - Code Status: Full but states she does not want intubation 5/25.  - Diet: Heart healthy  - DVT Px: Coumadin per pharmacy  - GI Px: ppi  - Skin Integrity: intact  - Social / Family: Husband admitted same day(5/25) to [facility]. Family at bedside 5/27. Pt and family desire full code status.  Dr. Kalman Shan, M.D., [facility].C.P  Pulmonary and Critical Care Medicine  Staff Physician  [facility]  [place] [facility]  Pager: [phone], If no answer or between 15:00h - 7:00h: call [phone]       Labs at discharge Lab Results  Component Value Date   CREATININE 0.71 09/15/2011   BUN 13 09/15/2011   NA 140 09/15/2011   K 4.4 09/15/2011   CL 102 09/15/2011   CO2 33* 09/15/2011   Lab Results  Component Value Date   WBC 6.3 09/15/2011   HGB 10.9* 09/15/2011   HCT 33.9* 09/15/2011   MCV 89.4 09/15/2011   PLT 264 09/15/2011   Lab Results  Component Value Date   ALT 21 09/12/2011   AST 37 09/12/2011   ALKPHOS 36* 09/12/2011   BILITOT 0.5 09/12/2011   Lab Results  Component Value Date   INR 1.37 09/16/2011   INR 2.23* 09/15/2011   INR 5.00* 09/14/2011    Current radiology studies No results found.  Disposition:  01-Home or Self Care  Discharge Orders    Future Appointments: Provider: Department: Dept Phone: Center:   09/23/2011  10:45 AM Julio Sicks, NP [facility] [phone] None     Medication List  As of 09/16/2011 11:31 AM   STOP taking these medications         ADVAIR DISKUS 250-50 MCG/DOSE Aepb      albuterol (2.5 MG/3ML) 0.083% nebulizer solution      gabapentin 300 MG capsule      montelukast 10 MG tablet         TAKE these medications         atorvastatin 40 MG tablet   Commonly known as: LIPITOR   Take 20 mg by mouth daily.  buPROPion 150 MG 24 hr tablet   Commonly known as: WELLBUTRIN XL   Take 150 mg by mouth daily.      ciprofloxacin 500 MG tablet   Commonly known as: CIPRO   Take 1 tablet (500 mg total) by mouth 2 (two) times daily.      citalopram 20 MG tablet   Commonly known as: CELEXA   Take 20 mg by mouth daily.      clonazePAM 0.5 MG tablet   Commonly known as: KLONOPIN   Take 1 tablet (0.5 mg total) by mouth 3 (three) times daily.      clonazePAM 0.5 MG tablet   Commonly known as: KLONOPIN   Take 1 mg by mouth 3 (three) times daily as needed. For nervousness      fenofibrate 160 MG tablet   Take 160 mg by mouth daily.      Fluticasone-Salmeterol 100-50 MCG/DOSE Aepb   Commonly known as: ADVAIR   Inhale 1 puff into the lungs every 12 (twelve) hours.      folic acid 1 MG tablet   Commonly known as: FOLVITE   Take 1 mg by mouth daily.      furosemide 20 MG tablet   Commonly known as: LASIX   Take 20 mg by mouth every other day. On Monday, Wednesday and Friday      ipratropium-albuterol 0.5-2.5 (3) MG/3ML Soln   Commonly known as: DUONEB   Take 3 mLs by nebulization every 6 (six) hours as needed. For shortness of breath or wheezing      isosorbide mononitrate 30 MG 24 hr tablet   Commonly known as: IMDUR   Take 30 mg by mouth daily.      metoprolol tartrate 25 MG tablet   Commonly known as: LOPRESSOR   Take 12.5 mg by mouth 2 (two) times daily.      nitroGLYCERIN 0.4 MG SL tablet   Commonly known as: NITROSTAT   Place 0.4 mg under the tongue  every 5 (five) minutes as needed. For chest pain      omeprazole 20 MG capsule   Commonly known as: PRILOSEC   Take 20 mg by mouth.      oxyCODONE-acetaminophen 10-325 MG per tablet   Commonly known as: PERCOCET   Take 1 tablet by mouth every 8 (eight) hours as needed. For pain      OXYGEN-HELIUM IN   Inhale into the lungs continuous. 2 liters /[place]      pantoprazole 40 MG tablet   Commonly known as: PROTONIX   Take 40 mg by mouth daily.      predniSONE 1 MG tablet   Commonly known as: DELTASONE   40 mg PO daily x 2, then 30 mg PO daily x 2, then 20 mg PO daily x 2 then, 10 mg PO daily x 2 then stop.      promethazine 25 MG tablet   Commonly known as: PHENERGAN   Take 12.5-25 mg by mouth every 6 (six) hours as needed. For nausea      tiotropium 18 MCG inhalation capsule   Commonly known as: SPIRIVA   Place 18 mcg into inhaler and inhale daily.      traMADol 50 MG tablet   Commonly known as: ULTRAM   Take 50 mg by mouth.      Vitamin D (Ergocalciferol) 50000 UNITS Caps   Commonly known as: DRISDOL   Take 50,000 Units by mouth every 7 (seven) days. On Mondays  warfarin 3 MG tablet   Commonly known as: COUMADIN   Take 0.5 tablets (1.5 mg total) by mouth daily. The patient states that her most current regimen is 1 tablet (3 mg) daily EXCEPT for 0.5 tablet (1.5 mg) on Wednesday only.           Follow-up Information    Follow up with Allie Dimmer, OTR on 09/18/2011. (2:30 pm)    Contact information:   [address] [place] [address] [phone]       Follow up with [facility], NP on 09/23/2011. (10:45 am. Bring all your mediations to office visit.)    Contact information:   [employer] [address] [place] [address] [phone]          Discharged Condition: fair Vital signs at Discharge. Temp:  [98.1 F (36.7 C)-98.6 F (37 C)] 98.5 F (36.9 C) (05/29 0550) Pulse Rate:  [59-73]  73  (05/29 0550) Resp:  [20] 20  (05/29 0550) BP: (94-114)/(57-68) 110/60 mmHg (05/29 0550) SpO2:  [94 %-96 %] 96 % (05/29 0906) Office follow up Special Information or instructions. Follow up with Paulita Cradle NP  to monitor  coumadin. Follow up with Tammy Parrett ANP-C with all medications. She is a [employer] farm manager. And will follow up with him in near future. She was smoking up to day of admission despite being on home O2. Signed: Brett Canales Minor ACNP [facility] PCCM Pager [phone] till 3 pm If no answer page [phone] 09/16/2011, 11:31 AM   STAFF NOTE:   > 30 min dc planning. See muy separate note for the day; rest per NP

## 2011-09-16 NOTE — Consult Note (Signed)
ANTICOAGULATION CONSULT NOTE - Follow Up Consult  Pharmacy Consult for Coumadin Indication: DVT and stroke  Allergies  Allergen Reactions  . Aspirin Other (See Comments)    Son does not remember reaction  . Penicillins Hives and Itching    Patient Measurements: Height: 5\' 3"  (160 cm) Weight: 129 lb 10.1 oz (58.8 kg) IBW/kg (Calculated) : 52.4    Vital Signs: Temp: 98.5 F (36.9 C) (05/29 0550) BP: 110/60 mmHg (05/29 0550) Pulse Rate: 73  (05/29 0550)  Labs:  Basename 09/16/11 0630 09/15/11 0557 09/14/11 0500  HGB -- 10.9* 10.5*  HCT -- 33.9* 32.3*  PLT -- 264 249  APTT -- -- --  LABPROT 17.1* 25.1* 47.1*  INR 1.37 2.23* 5.00*  HEPARINUNFRC -- -- --  CREATININE -- 0.71 0.67  CKTOTAL -- -- --  CKMB -- -- --  TROPONINI -- -- --   Estimated Creatinine Clearance: 56.4 ml/min (by C-G formula based on Cr of 0.71).   Medications:  Prescriptions prior to admission  Medication Sig Dispense Refill  . atorvastatin (LIPITOR) 40 MG tablet Take 20 mg by mouth daily.       Marland Kitchen buPROPion (WELLBUTRIN XL) 150 MG 24 hr tablet Take 150 mg by mouth daily.       . citalopram (CELEXA) 20 MG tablet Take 20 mg by mouth daily.       . clonazePAM (KLONOPIN) 0.5 MG tablet Take 1 mg by mouth 3 (three) times daily as needed. For nervousness      . fenofibrate 160 MG tablet Take 160 mg by mouth daily.       . Fluticasone-Salmeterol (ADVAIR) 100-50 MCG/DOSE AEPB Inhale 1 puff into the lungs every 12 (twelve) hours.      . folic acid (FOLVITE) 1 MG tablet Take 1 mg by mouth daily.       . furosemide (LASIX) 20 MG tablet Take 20 mg by mouth every other day. On Monday, Wednesday and Friday      . ipratropium-albuterol (DUONEB) 0.5-2.5 (3) MG/3ML SOLN Take 3 mLs by nebulization every 6 (six) hours as needed. For shortness of breath or wheezing      . isosorbide mononitrate (IMDUR) 30 MG 24 hr tablet Take 30 mg by mouth daily.      . metoprolol tartrate (LOPRESSOR) 25 MG tablet Take 12.5 mg by mouth 2  (two) times daily.      Marland Kitchen omeprazole (PRILOSEC) 20 MG capsule Take 20 mg by mouth.      . oxyCODONE-acetaminophen (PERCOCET) 10-325 MG per tablet Take 1 tablet by mouth every 8 (eight) hours as needed. For pain      . pantoprazole (PROTONIX) 40 MG tablet Take 40 mg by mouth daily.       . promethazine (PHENERGAN) 25 MG tablet Take 12.5-25 mg by mouth every 6 (six) hours as needed. For nausea      . tiotropium (SPIRIVA) 18 MCG inhalation capsule Place 18 mcg into inhaler and inhale daily.       . traMADol (ULTRAM) 50 MG tablet Take 50 mg by mouth.      . Vitamin D, Ergocalciferol, (DRISDOL) 50000 UNITS CAPS Take 50,000 Units by mouth every 7 (seven) days. On Mondays      . DISCONTD: gabapentin (NEURONTIN) 300 MG capsule Take 300 mg by mouth 3 (three) times daily.       Marland Kitchen DISCONTD: montelukast (SINGULAIR) 10 MG tablet Take 10 mg by mouth at bedtime.       Marland Kitchen DISCONTD:  warfarin (COUMADIN) 3 MG tablet Take 1.5-3 mg by mouth daily. The patient states that her most current regimen is 1 tablet (3 mg) daily EXCEPT for 0.5 tablet (1.5 mg) on Wednesday only.      . clonazePAM (KLONOPIN) 0.5 MG tablet Take 1 tablet (0.5 mg total) by mouth 3 (three) times daily.  30 tablet  0  . nitroGLYCERIN (NITROSTAT) 0.4 MG SL tablet Place 0.4 mg under the tongue every 5 (five) minutes as needed. For chest pain      . OXYGEN-HELIUM IN Inhale into the lungs continuous. 2 liters /[place]        Scheduled:    . albuterol  2.5 mg Nebulization TID  . buPROPion  150 mg Oral Daily  . ciprofloxacin  500 mg Oral BID  . citalopram  20 mg Oral Daily  . fenofibrate  160 mg Oral Daily  . Fluticasone-Salmeterol  1 puff Inhalation BID  . furosemide  20 mg Oral Q M,W,F  . isosorbide mononitrate  30 mg Oral Daily  . metoprolol tartrate  12.5 mg Oral BID  . pantoprazole  40 mg Oral Q1200  . predniSONE  40 mg Oral Q breakfast  . tiotropium  18 mcg Inhalation Daily  . warfarin  1.5 mg Oral ONCE-1800  . Warfarin - Pharmacist Dosing  Inpatient   Does not apply q1800  . DISCONTD: moxifloxacin  400 mg Oral Daily  . DISCONTD: predniSONE  40 mg Oral BID WC   Anti-infectives     Start     Dose/Rate Route Frequency Ordered Stop   09/16/11 1145   ciprofloxacin (CIPRO) tablet 500 mg        500 mg Oral 2 times daily 09/16/11 1105     09/16/11 0000   moxifloxacin (AVELOX) 400 MG tablet  Status:  Discontinued        400 mg Oral Daily 09/16/11 1103 09/16/11    09/16/11 0000   ciprofloxacin (CIPRO) 500 MG tablet        500 mg Oral 2 times daily 09/16/11 1121 09/26/11 2359   09/15/11 1000   moxifloxacin (AVELOX) tablet 400 mg  Status:  Discontinued        400 mg Oral Daily 09/14/11 1715 09/16/11 1105   09/12/11 1030   moxifloxacin (AVELOX) IVPB 400 mg  Status:  Discontinued        400 mg 250 mL/hr over 60 Minutes Intravenous Every 24 hours 09/12/11 0941 09/14/11 1715          Assessment:  66 y/o female on chronic Coumadin for history of DVT and CVA  INR SUBtherapeutic 1.37.  No evidence of bleeding observed.  Avelox discontinued.  Cipro 500mg  po BID started for + Pseudomonas from Trach Asp.(ss to Cipro)    Day # 5 of floroquinolones total.  CrCl ~ 56 ml/min >> No adjustment in Cipro required.  Goal of Therapy:   INR 2-3   Plan:   Coumadin 3 mg po today Daily INR's, CBC.  Lela Gell, Elisha Headland, Pharm.D. 09/16/2011 11:32 AM

## 2011-09-16 NOTE — Plan of Care (Signed)
Problem: Phase III Progression Outcomes Goal: Activity at appropriate level-compared to baseline (UP IN CHAIR FOR HEMODIALYSIS)  Outcome: Completed/Met Date Met:  09/16/11 Pt reports "I feel 100% better than before I came in."

## 2011-09-16 NOTE — Progress Notes (Addendum)
Physical Therapy Evaluation Note  Past Medical History  Diagnosis Date  . Myocardial infarction     times 2  . CHF (congestive heart failure)   . Stroke   . Deep vein thrombosis     Phlebitis  . Hyperlipidemia   . Sleep apnea   . 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 Coronary artery disease   . Chronic pain     due to prior hip fracture and DDD back    Past Surgical History  Procedure Date  . Appendectomy   . Back surgery   . Cholecystectomy   . Coronary stent placement   . Breast surgery     benign tumors     09/16/11 1124  PT Visit Information  Last PT Received On 09/16/11  Assistance Needed +1  PT Time Calculation  PT Start Time 1124  PT Stop Time 1142  PT Time Calculation (min) 18 min  Subjective Data  Subjective Pt received sitting EOB with report "I woke up sick to my stomach this morning."  Precautions  Precautions Fall  Restrictions  Weight Bearing Restrictions No  Home Living  Lives With Spouse;Daughter  Available Help at Discharge Family;Available 24 hours/day  Type of Home House  Home Access Stairs to enter  Entrance Stairs-Number of Steps 4-5  Entrance Stairs-Rails Can reach both  Home Layout One level  Bathroom Shower/Tub Tub/shower unit;Walk-in Pension scheme manager Yes  How Accessible Accessible via walker  Home Adaptive Equipment Walker - rolling;Tub transfer bench;Raised toilet seat with rails  Additional Comments pt reports using cane at home PTA and on 3LO2 via [place]  Prior Function  Level of [place] Needs assistance  Needs Assistance Dressing;Meal Prep;Bathing  Bath (daughter washes backside)  Dressing (daughter puts shoes on)  Meal Prep (daughter cooks meals)  Able to Take Stairs? Yes  Driving No  Vocation Retired  Geneticist, molecular No difficulties  Cognition  Overall Cognitive Status Appears within functional  limits for tasks assessed/performed  Arousal/Alertness Awake/alert  Orientation Level Oriented X4 / Intact  Behavior During Session [facility] for tasks performed  Right Upper Extremity Assessment  RUE ROM/Strength/Tone WFL  Left Upper Extremity Assessment  LUE ROM/Strength/Tone WFL  Right Lower Extremity Assessment  RLE ROM/Strength/Tone WFL  Left Lower Extremity Assessment  LLE ROM/Strength/Tone WFL  Trunk Assessment  Trunk Assessment Normal  Bed Mobility  Bed Mobility Supine to Sit;Sit to Supine  Supine to Sit 6: Modified independent (Device/Increase time);HOB flat  Sit to Supine 6: Modified independent (Device/Increase time);HOB flat  Transfers  Transfers Sit to Stand;Stand to Sit  Sit to Stand 4: Min guard;From bed  Stand to Sit 4: Min guard;With upper extremity assist;To chair/3-in-1  Details for Transfer Assistance pt initally unsteady upon standing up requirng guard, patient automatically starts reaching for something to hold onto  Ambulation/Gait  Ambulation/Gait Assistance 4: Min guard  Ambulation Distance (Feet) 150 Feet  Assistive device Rolling walker  Ambulation/Gait Assistance Details pt with tendency to vear to the Left, pt with safe walker management  Gait Pattern Step-through pattern  Gait velocity wfl  Stairs Yes  Stairs Assistance 4: Min guard  Stair Management Technique Two rails;Forwards  Number of Stairs 2   Engineering geologist No  Balance  Balance Assessed Yes  Static Standing Balance  Static Standing - Balance Support No upper extremity supported  Static Standing - Level of  Assistance 4: Min assist  Static Standing - Comment/# of Minutes pt with increased stability with bilat UE support  Dynamic Standing Balance  Dynamic Standing - Balance Support Bilateral upper extremity supported  Dynamic Standing - Level of Assistance 5: Stand by assistance (with RW)  Dynamic Standing - Comments pt requires min/modA for ambulation without AD.  Patient with increased stability with RW  PT - End of Session  Equipment Utilized During Treatment Gait belt  Activity Tolerance Patient tolerated treatment well  Patient left in chair;with call bell/phone within reach  Nurse Communication Mobility status  PT Assessment  Clinical Impression Statement Pt admitted with FTT now presenting with generalized deconditioning but appears to be functioning close to baseline. Patient requires 24/7 supervision/assist for safe d/c home. Spouse and daughter able to provide 24/7 assist. Patient at increased fall risk and requires RW for safe ambulation.  PT Recommendation/Assessment Patient needs continued PT services  PT Problem List Decreased activity tolerance;Decreased balance  PT Therapy Diagnosis  Generalized weakness  PT Plan  PT Frequency Min 3X/week  PT Treatment/Interventions Gait training;Stair training;Functional mobility training;Therapeutic activities;Therapeutic exercise;Balance training  PT Recommendation  Follow Up Recommendations No PT follow up;Supervision/Assistance - 24 hour  Equipment Recommended None recommended by PT (pt with recommended DME)  Individuals Consulted  Consulted and Agree with Results and Recommendations Patient  Acute Rehab PT Goals  PT Goal Formulation With patient  Time For Goal Achievement 09/23/11  Potential to Achieve Goals Good  Pt will go Sit to Stand with modified independence;with upper extremity assist (up to RW.)  PT Goal: Sit to Stand - Progress Goal set today  Pt will Ambulate >150 feet;with modified independence;with rolling walker  PT Goal: Ambulate - Progress Goal set today  Pt will Go Up / Down Stairs 3-5 stairs;with supervision;with rail(s)  PT Goal: Up/Down Stairs - Progress Goal set today  Written Expression  Dominant Hand Right    Vision: pt with + nystagmis in bilat eyes. Patient denies dizziness/lightheadedness and reports the nystagmis to have been there a long time.  Pain: pt denies  any  Lewis Shock, PT, DPT Pager #: [phone] Office #: [phone]

## 2011-09-18 LAB — CULTURE, BLOOD (ROUTINE X 2)
Culture  Setup Time: 201305251734
Culture: NO GROWTH

## 2011-09-23 ENCOUNTER — Inpatient Hospital Stay: Payer: Medicare Other | Admitting: Adult Health

## 2011-10-09 ENCOUNTER — Inpatient Hospital Stay: Payer: Medicare Other | Admitting: Adult Health

## 2011-10-19 ENCOUNTER — Inpatient Hospital Stay: Payer: Medicare Other | Admitting: Adult Health

## 2011-11-23 ENCOUNTER — Emergency Department (HOSPITAL_COMMUNITY): Payer: PRIVATE HEALTH INSURANCE

## 2011-11-23 ENCOUNTER — Inpatient Hospital Stay (HOSPITAL_COMMUNITY)
Admission: EM | Admit: 2011-11-23 | Discharge: 2011-11-25 | DRG: 291 | Disposition: A | Discharge status: Home / Self Care | Payer: PRIVATE HEALTH INSURANCE | Attending: Internal Medicine | Admitting: Internal Medicine

## 2011-11-23 ENCOUNTER — Encounter (HOSPITAL_COMMUNITY): Payer: Self-pay | Admitting: *Deleted

## 2011-11-23 DIAGNOSIS — I5032 Chronic diastolic (congestive) heart failure: Secondary | ICD-10-CM

## 2011-11-23 DIAGNOSIS — I739 Peripheral vascular disease, unspecified: Secondary | ICD-10-CM

## 2011-11-23 DIAGNOSIS — I5021 Acute systolic (congestive) heart failure: Principal | ICD-10-CM | POA: Diagnosis present

## 2011-11-23 DIAGNOSIS — I509 Heart failure, unspecified: Secondary | ICD-10-CM | POA: Diagnosis present

## 2011-11-23 DIAGNOSIS — F17200 Nicotine dependence, unspecified, uncomplicated: Secondary | ICD-10-CM | POA: Diagnosis present

## 2011-11-23 DIAGNOSIS — I219 Acute myocardial infarction, unspecified: Secondary | ICD-10-CM

## 2011-11-23 DIAGNOSIS — E876 Hypokalemia: Secondary | ICD-10-CM | POA: Diagnosis present

## 2011-11-23 DIAGNOSIS — D689 Coagulation defect, unspecified: Secondary | ICD-10-CM | POA: Diagnosis present

## 2011-11-23 DIAGNOSIS — J9610 Chronic respiratory failure, unspecified whether with hypoxia or hypercapnia: Secondary | ICD-10-CM

## 2011-11-23 DIAGNOSIS — Z9981 Dependence on supplemental oxygen: Secondary | ICD-10-CM

## 2011-11-23 DIAGNOSIS — Z8673 Personal history of transient ischemic attack (TIA), and cerebral infarction without residual deficits: Secondary | ICD-10-CM

## 2011-11-23 DIAGNOSIS — G8929 Other chronic pain: Secondary | ICD-10-CM | POA: Diagnosis present

## 2011-11-23 DIAGNOSIS — E785 Hyperlipidemia, unspecified: Secondary | ICD-10-CM | POA: Diagnosis present

## 2011-11-23 DIAGNOSIS — J449 Chronic obstructive pulmonary disease, unspecified: Secondary | ICD-10-CM

## 2011-11-23 DIAGNOSIS — R079 Chest pain, unspecified: Secondary | ICD-10-CM

## 2011-11-23 DIAGNOSIS — J9620 Acute and chronic respiratory failure, unspecified whether with hypoxia or hypercapnia: Secondary | ICD-10-CM | POA: Diagnosis present

## 2011-11-23 DIAGNOSIS — R911 Solitary pulmonary nodule: Secondary | ICD-10-CM | POA: Diagnosis present

## 2011-11-23 DIAGNOSIS — I82509 Chronic embolism and thrombosis of unspecified deep veins of unspecified lower extremity: Secondary | ICD-10-CM | POA: Diagnosis present

## 2011-11-23 DIAGNOSIS — I2510 Atherosclerotic heart disease of native coronary artery without angina pectoris: Secondary | ICD-10-CM | POA: Diagnosis present

## 2011-11-23 DIAGNOSIS — I313 Pericardial effusion (noninflammatory): Secondary | ICD-10-CM

## 2011-11-23 DIAGNOSIS — R6251 Failure to thrive (child): Secondary | ICD-10-CM

## 2011-11-23 DIAGNOSIS — I80299 Phlebitis and thrombophlebitis of other deep vessels of unspecified lower extremity: Secondary | ICD-10-CM

## 2011-11-23 DIAGNOSIS — G4730 Sleep apnea, unspecified: Secondary | ICD-10-CM

## 2011-11-23 DIAGNOSIS — I6789 Other cerebrovascular disease: Secondary | ICD-10-CM | POA: Diagnosis present

## 2011-11-23 DIAGNOSIS — J441 Chronic obstructive pulmonary disease with (acute) exacerbation: Secondary | ICD-10-CM | POA: Diagnosis present

## 2011-11-23 DIAGNOSIS — I252 Old myocardial infarction: Secondary | ICD-10-CM

## 2011-11-23 DIAGNOSIS — Z7901 Long term (current) use of anticoagulants: Secondary | ICD-10-CM

## 2011-11-23 LAB — BASIC METABOLIC PANEL
BUN: 8 mg/dL (ref 6–23)
CO2: 41 mEq/L (ref 19–32)
Chloride: 95 mEq/L — ABNORMAL LOW (ref 96–112)
Creatinine, Ser: 0.8 mg/dL (ref 0.50–1.10)
GFR calc Af Amer: 86 mL/min — ABNORMAL LOW (ref >90)
Potassium: 3.2 mEq/L — ABNORMAL LOW (ref 3.5–5.1)

## 2011-11-23 LAB — CBC WITH DIFFERENTIAL/PLATELET
HCT: 41.5 % (ref 36.0–46.0)
Hemoglobin: 13.9 g/dL (ref 12.0–15.0)
Lymphocytes Relative: 51 % — ABNORMAL HIGH (ref 12–46)
Monocytes Absolute: 0.9 10*3/uL (ref 0.1–1.0)
Monocytes Relative: 10 % (ref 3–12)
Neutro Abs: 3.3 10*3/uL (ref 1.7–7.7)
Neutrophils Relative %: 36 % — ABNORMAL LOW (ref 43–77)
RBC: 4.59 MIL/uL (ref 3.87–5.11)
WBC: 9.2 10*3/uL (ref 4.0–10.5)

## 2011-11-23 LAB — PROTIME-INR: Prothrombin Time: 37.6 seconds — ABNORMAL HIGH (ref 11.6–15.2)

## 2011-11-23 LAB — PRO B NATRIURETIC PEPTIDE: Pro B Natriuretic peptide (BNP): 462.7 pg/mL — ABNORMAL HIGH (ref 0–125)

## 2011-11-23 LAB — OCCULT BLOOD, POC DEVICE: Fecal Occult Bld: NEGATIVE

## 2011-11-23 MED ORDER — FUROSEMIDE 10 MG/ML IJ SOLN
20.0000 mg | Freq: Every day | INTRAMUSCULAR | Status: DC
  Administered 2011-11-24: 20 mg via INTRAVENOUS
  Filled 2011-11-23 (×2): qty 2

## 2011-11-23 MED ORDER — NITROGLYCERIN 0.4 MG SL SUBL: 0.4000 mg | SUBLINGUAL_TABLET | SUBLINGUAL | Status: DC | PRN

## 2011-11-23 MED ORDER — ONDANSETRON HCL 4 MG PO TABS: 4.0000 mg | ORAL_TABLET | Freq: Four times a day (QID) | ORAL | Status: DC | PRN

## 2011-11-23 MED ORDER — BUDESONIDE 0.5 MG/2ML IN SUSP
0.5000 mg | Freq: Two times a day (BID) | RESPIRATORY_TRACT | Status: DC
  Administered 2011-11-24 – 2011-11-25 (×3): 0.5 mg via RESPIRATORY_TRACT
  Filled 2011-11-23 (×6): qty 2

## 2011-11-23 MED ORDER — BUPROPION HCL ER (XL) 150 MG PO TB24
150.0000 mg | ORAL_TABLET | Freq: Every day | ORAL | Status: DC
  Administered 2011-11-24 – 2011-11-25 (×2): 150 mg via ORAL
  Filled 2011-11-23 (×2): qty 1

## 2011-11-23 MED ORDER — FOLIC ACID 1 MG PO TABS
1.0000 mg | ORAL_TABLET | Freq: Every day | ORAL | Status: DC
  Administered 2011-11-24 – 2011-11-25 (×2): 1 mg via ORAL
  Filled 2011-11-23 (×2): qty 1

## 2011-11-23 MED ORDER — POTASSIUM CHLORIDE CRYS ER 10 MEQ PO TBCR
10.0000 meq | EXTENDED_RELEASE_TABLET | Freq: Two times a day (BID) | ORAL | Status: DC
  Administered 2011-11-24 – 2011-11-25 (×2): 10 meq via ORAL
  Filled 2011-11-23 (×5): qty 1

## 2011-11-23 MED ORDER — ALBUTEROL SULFATE (5 MG/ML) 0.5% IN NEBU
2.5000 mg | INHALATION_SOLUTION | RESPIRATORY_TRACT | Status: DC
  Administered 2011-11-23: 2.5 mg via RESPIRATORY_TRACT
  Filled 2011-11-23 (×3): qty 0.5

## 2011-11-23 MED ORDER — TRAMADOL HCL 50 MG PO TABS: 50.0000 mg | ORAL_TABLET | Freq: Two times a day (BID) | ORAL | Status: DC | PRN

## 2011-11-23 MED ORDER — ACETAMINOPHEN 325 MG PO TABS
650.0000 mg | ORAL_TABLET | Freq: Four times a day (QID) | ORAL | Status: DC | PRN
  Administered 2011-11-24: 650 mg via ORAL
  Filled 2011-11-23: qty 2

## 2011-11-23 MED ORDER — IPRATROPIUM BROMIDE 0.02 % IN SOLN
0.5000 mg | RESPIRATORY_TRACT | Status: DC
  Administered 2011-11-23: 0.5 mg via RESPIRATORY_TRACT
  Filled 2011-11-23 (×3): qty 2.5

## 2011-11-23 MED ORDER — POTASSIUM CHLORIDE CRYS ER 20 MEQ PO TBCR
40.0000 meq | EXTENDED_RELEASE_TABLET | Freq: Once | ORAL | Status: AC
  Administered 2011-11-23: 40 meq via ORAL
  Filled 2011-11-23: qty 2

## 2011-11-23 MED ORDER — CLONAZEPAM 1 MG PO TABS
1.0000 mg | ORAL_TABLET | Freq: Three times a day (TID) | ORAL | Status: DC | PRN
  Administered 2011-11-24: 1 mg via ORAL
  Filled 2011-11-23: qty 1

## 2011-11-23 MED ORDER — CITALOPRAM HYDROBROMIDE 20 MG PO TABS
20.0000 mg | ORAL_TABLET | Freq: Every day | ORAL | Status: DC
  Administered 2011-11-24 – 2011-11-25 (×2): 20 mg via ORAL
  Filled 2011-11-23 (×2): qty 1

## 2011-11-23 MED ORDER — OXYCODONE-ACETAMINOPHEN 10-325 MG PO TABS: 1.0000 | ORAL_TABLET | Freq: Four times a day (QID) | ORAL | Status: DC | PRN

## 2011-11-23 MED ORDER — ACETAMINOPHEN 650 MG RE SUPP: 650.0000 mg | Freq: Four times a day (QID) | RECTAL | Status: DC | PRN

## 2011-11-23 MED ORDER — PANTOPRAZOLE SODIUM 40 MG PO TBEC
40.0000 mg | DELAYED_RELEASE_TABLET | Freq: Every day | ORAL | Status: DC
  Administered 2011-11-25: 40 mg via ORAL
  Filled 2011-11-23: qty 1

## 2011-11-23 MED ORDER — SODIUM CHLORIDE 0.9 % IJ SOLN
3.0000 mL | Freq: Two times a day (BID) | INTRAMUSCULAR | Status: DC
  Administered 2011-11-24 – 2011-11-25 (×2): 3 mL via INTRAVENOUS

## 2011-11-23 MED ORDER — ONDANSETRON HCL 4 MG/2ML IJ SOLN
4.0000 mg | Freq: Four times a day (QID) | INTRAMUSCULAR | Status: DC | PRN
  Administered 2011-11-24: 4 mg via INTRAVENOUS
  Filled 2011-11-23: qty 2

## 2011-11-23 MED ORDER — DEXTROSE 5 % IV SOLN
500.0000 mg | INTRAVENOUS | Status: DC
  Administered 2011-11-24: 500 mg via INTRAVENOUS
  Filled 2011-11-23: qty 500

## 2011-11-23 MED ORDER — SODIUM CHLORIDE 0.9 % IJ SOLN
3.0000 mL | Freq: Two times a day (BID) | INTRAMUSCULAR | Status: DC
  Administered 2011-11-24 (×2): 3 mL via INTRAVENOUS

## 2011-11-23 MED ORDER — FENOFIBRATE 160 MG PO TABS
160.0000 mg | ORAL_TABLET | Freq: Every day | ORAL | Status: DC
  Administered 2011-11-24 – 2011-11-25 (×2): 160 mg via ORAL
  Filled 2011-11-23 (×2): qty 1

## 2011-11-23 MED ORDER — PANTOPRAZOLE SODIUM 40 MG PO TBEC
40.0000 mg | DELAYED_RELEASE_TABLET | Freq: Every day | ORAL | Status: DC
  Administered 2011-11-24: 40 mg via ORAL
  Filled 2011-11-23: qty 1

## 2011-11-23 MED ORDER — ALBUTEROL SULFATE (5 MG/ML) 0.5% IN NEBU
2.5000 mg | INHALATION_SOLUTION | Freq: Four times a day (QID) | RESPIRATORY_TRACT | Status: DC
  Administered 2011-11-24 (×3): 2.5 mg via RESPIRATORY_TRACT
  Filled 2011-11-23 (×5): qty 0.5

## 2011-11-23 MED ORDER — METOPROLOL TARTRATE 12.5 MG HALF TABLET
12.5000 mg | ORAL_TABLET | Freq: Two times a day (BID) | ORAL | Status: DC
  Administered 2011-11-24 – 2011-11-25 (×4): 12.5 mg via ORAL
  Filled 2011-11-23 (×5): qty 1

## 2011-11-23 MED ORDER — ATORVASTATIN CALCIUM 20 MG PO TABS
20.0000 mg | ORAL_TABLET | Freq: Every day | ORAL | Status: DC
  Administered 2011-11-24 – 2011-11-25 (×2): 20 mg via ORAL
  Filled 2011-11-23 (×2): qty 1

## 2011-11-23 MED ORDER — ISOSORBIDE MONONITRATE ER 30 MG PO TB24
30.0000 mg | ORAL_TABLET | Freq: Every day | ORAL | Status: DC
  Administered 2011-11-24 – 2011-11-25 (×2): 30 mg via ORAL
  Filled 2011-11-23 (×2): qty 1

## 2011-11-23 MED ORDER — PROMETHAZINE HCL 12.5 MG PO TABS: 12.5000 mg | ORAL_TABLET | Freq: Four times a day (QID) | ORAL | Status: DC | PRN

## 2011-11-23 MED ORDER — ALBUTEROL SULFATE (5 MG/ML) 0.5% IN NEBU: 2.5000 mg | INHALATION_SOLUTION | RESPIRATORY_TRACT | Status: DC | PRN

## 2011-11-23 MED ORDER — IPRATROPIUM BROMIDE 0.02 % IN SOLN
0.5000 mg | Freq: Four times a day (QID) | RESPIRATORY_TRACT | Status: DC
  Administered 2011-11-24 (×3): 0.5 mg via RESPIRATORY_TRACT
  Filled 2011-11-23 (×5): qty 2.5

## 2011-11-23 NOTE — H&P (Signed)
Aimee Sanders is an 66 y.o. female.   Patient was seen and examined on August 5 at 11:17 PM. PCP - [facility] Chief Complaint: Shortness of breath and increased INR. HPI: 66 year-old female with history of COPD on home oxygen, CAD status post stenting, DVT on Coumadin, CVA was experiencing increasing shortness of breath over the last 2-3 days. In addition patient is also noticed to have chest pain which he takes deep inspiration. Patient's shortness of breath increases on exertion. She also had checked her INR which was mildly supratherapeutic but patient did not have any rectal bleeding or any obvious bleeding source. Due to her complaints shows instructed to come to the ER. In the ER patient was found initially to be wheezing for which was given nebulizers and IV steroids. Cardiac enzymes have been negative. Chest x-ray shows nodule for which CT chest has been recommended. Patient has been admitted for further management.  Past Medical History  Diagnosis Date  . Myocardial infarction     times 2  . CHF (congestive heart failure)   . Stroke   . Deep vein thrombosis     Phlebitis  . Hyperlipidemia   . Sleep apnea   . 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 Coronary artery disease   . Chronic pain     due to prior hip fracture and DDD back    Past Surgical History  Procedure Date  . Appendectomy   . Back surgery   . Cholecystectomy   . Coronary stent placement   . Breast surgery     benign tumors    Family History  Problem Relation Age of Onset  . Hypertension Mother   . Breast cancer Mother   . Rheum arthritis Maternal Grandmother    Social History:  reports that she has been smoking Cigarettes.  She has a 40 pack-year smoking history. She does not have any smokeless tobacco history on file. She reports that she does not drink alcohol or use illicit  drugs.  Allergies:  Allergies  Allergen Reactions  . Aspirin Other (See Comments)    Son does not remember reaction  . Penicillins Hives and Itching     (Not in a Sanders admission)  Results for orders placed during the Sanders encounter of 11/23/11 (from the past 48 hour(s))  CBC WITH DIFFERENTIAL     Status: Abnormal   Collection Time   11/23/11  6:10 PM      Component Value Range Comment   WBC 9.2  4.0 - 10.5 K/uL    RBC 4.59  3.87 - 5.11 MIL/uL    Hemoglobin 13.9  12.0 - 15.0 g/dL    HCT 16.1  09.6 - 04.5 %    MCV 90.4  78.0 - 100.0 fL    MCH 30.3  26.0 - 34.0 pg    MCHC 33.5  30.0 - 36.0 g/dL    RDW 40.9  81.1 - 91.4 %    Platelets 315  150 - 400 K/uL    Neutrophils Relative 36 (*) 43 - 77 %    Neutro Abs 3.3  1.7 - 7.7 K/uL    Lymphocytes Relative 51 (*) 12 - 46 %    Lymphs Abs 4.7 (*) 0.7 - 4.0 K/uL    Monocytes Relative 10  3 - 12 %    Monocytes Absolute 0.9  0.1 -  1.0 K/uL    Eosinophils Relative 2  0 - 5 %    Eosinophils Absolute 0.2  0.0 - 0.7 K/uL    Basophils Relative 1  0 - 1 %    Basophils Absolute 0.1  0.0 - 0.1 K/uL   BASIC METABOLIC PANEL     Status: Abnormal   Collection Time   11/23/11  6:10 PM      Component Value Range Comment   Sodium 142  135 - 145 mEq/L    Potassium 3.2 (*) 3.5 - 5.1 mEq/L    Chloride 95 (*) 96 - 112 mEq/L    CO2 41 (*) 19 - 32 mEq/L    Glucose, Bld 104 (*) 70 - 99 mg/dL    BUN 8  6 - 23 mg/dL    Creatinine, Ser 8.41  0.50 - 1.10 mg/dL    Calcium 9.3  8.4 - 32.4 mg/dL    GFR calc non Af Amer 75 (*) >90 mL/min    GFR calc Af Amer 86 (*) >90 mL/min   PROTIME-INR     Status: Abnormal   Collection Time   11/23/11  6:10 PM      Component Value Range Comment   Prothrombin Time 37.6 (*) 11.6 - 15.2 seconds    INR 3.75 (*) 0.00 - 1.49   PRO B NATRIURETIC PEPTIDE     Status: Abnormal   Collection Time   11/23/11  6:29 PM      Component Value Range Comment   Pro B Natriuretic peptide (BNP) 462.7 (*) 0 - 125 pg/mL   OCCULT BLOOD,  POC DEVICE     Status: Normal   Collection Time   11/23/11  7:00 PM      Component Value Range Comment   Fecal Occult Bld NEGATIVE      Dg Chest 2 View  11/23/2011  *RADIOLOGY REPORT*  Clinical Data: Chest pain, shortness of breath  CHEST - 2 VIEW  Comparison: 07/08/2011  Findings: Stable cardiomegaly and interstitial prominence but no CHF or edema.  Background COPD/emphysema evident.  No definite focal pneumonia, collapse, consolidation, effusion or pneumothorax. Trachea midline.  Torturuous atherosclerotic aorta.  Previous vertebral augmentation changes noted at three levels.  Chronic compression fractures of the thoracic spine.  Nodular density measuring 14 x 9 mm along the left cardiac border noted.  This appears new since prior study.  Underlying nodule not excluded.  Recommend follow-up non emergent chest CT.  IMPRESSION: Cardiomegaly with chronic interstitial changes and COPD.  No CHF or pneumonia  Left lower lobe versus lingula nodule.  Please see above recommendation.  Original Report Authenticated By: Judie Petit. Ruel Favors, M.D.    Review of Systems  Constitutional: Negative.   HENT: Negative.   Eyes: Negative.   Respiratory: Positive for cough, sputum production and shortness of breath.   Cardiovascular: Positive for chest pain.  Gastrointestinal: Negative.   Genitourinary: Negative.   Musculoskeletal: Negative.   Skin: Negative.   Neurological: Negative.   Endo/Heme/Allergies: Negative.   Psychiatric/Behavioral: Negative.     Blood pressure 92/52, pulse 91, temperature 97.8 F (36.6 C), temperature source Oral, resp. rate 24, SpO2 94.00%. Physical Exam  Constitutional: She is oriented to person, place, and time. She appears well-developed and well-nourished. No distress.  HENT:  Head: Normocephalic and atraumatic.  Right Ear: External ear normal.  Left Ear: External ear normal.  Nose: Nose normal.  Mouth/Throat: Oropharynx is clear and moist. No oropharyngeal exudate.  Eyes:  Conjunctivae are normal. Pupils  are equal, round, and reactive to light. Right eye exhibits no discharge. Left eye exhibits no discharge. No scleral icterus.  Neck: Normal range of motion. Neck supple.  Cardiovascular: Normal rate and regular rhythm.   Respiratory: Effort normal and breath sounds normal. No respiratory distress. She has no wheezes. She has no rales.  GI: Soft. Bowel sounds are normal. She exhibits no distension. There is no tenderness. There is no rebound.       Small abdominal wall hernia appears non obstructed.  Musculoskeletal: Normal range of motion. She exhibits no edema and no tenderness.  Neurological: She is alert and oriented to person, place, and time.       Moves all extremities.  Skin: Skin is warm and dry. No rash noted. She is not diaphoretic. No erythema.  Psychiatric: Her behavior is normal.     Assessment/Plan #1. Shortness of breath probably a combination of COPD and diastolic CHF - patient has been continued on nebulizer, Pulmicort, placed on Zithromax and small dose of IV Lasix. #2. Atypical chest pain in a patient with known history of CAD status post stenting - check cardiac enzymes. Check 2-D echo. #3. DVT on Coumadin with coagulopathy secondary to Coumadin - Coumadin per pharmacy. #4. Lung nodule - CT chest has been ordered. Further recommendations based on CT chest. #5. Ongoing tobacco abuse - strongly advised to quit smoking. #6. History of CVA.  CODE STATUS - full code.  Angala Hilgers N. 11/23/2011, 11:17 PM

## 2011-11-23 NOTE — ED Provider Notes (Signed)
Complains of generalized weakness and shortness of breath with cough, productive of yellowish sputum for 2 days. Chest pain worse with coughing. No known fever. Seen at family practice earlier today sent here for further evaluation. On exam speaks in sentences lungs with prolonged extrication extra wheezes no respiratory distress. Spoke with Dr.Kakrakandy Plan telemetry oxygen   Doug Sou, MD 11/24/11 [phone]

## 2011-11-23 NOTE — ED Notes (Signed)
Per EMS- pt began having SOB/Chest discomfort last night and continued to this morning. Pt has had nausea as well. Pt is on 3L at home and was started on 4L O2 with EMS. Pt was at family practice when this morning. Their EKG showed depression in a lead but EMS was unable to notice on their EKG. BP 118/69. Pt had chest xray today at family practice.

## 2011-11-23 NOTE — Progress Notes (Signed)
ANTICOAGULATION CONSULT NOTE - Initial Consult  Pharmacy Consult for Coumadin Indication: DVT  Allergies  Allergen Reactions  . Aspirin Other (See Comments)    Son does not remember reaction  . Penicillins Hives and Itching    Vital Signs: Temp: 97.8 F (36.6 C) (08/05 1617) Temp src: Oral (08/05 1617) BP: 92/52 mmHg (08/05 2130) Pulse Rate: 91  (08/05 2130)  Labs:  Basename 11/23/11 1810  HGB 13.9  HCT 41.5  PLT 315  APTT --  LABPROT 37.6*  INR 3.75*  HEPARINUNFRC --  CREATININE 0.80  CKTOTAL --  CKMB --  TROPONINI --    Medical History: Past Medical History  Diagnosis Date  . Myocardial infarction     times 2  . CHF (congestive heart failure)   . Stroke   . Deep vein thrombosis     Phlebitis  . Hyperlipidemia   . Sleep apnea   . 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 Coronary artery disease   . Chronic pain     due to prior hip fracture and DDD back    Medications:  Prescriptions prior to admission  Medication Sig Dispense Refill  . atorvastatin (LIPITOR) 40 MG tablet Take 20 mg by mouth daily.       Marland Kitchen buPROPion (WELLBUTRIN XL) 150 MG 24 hr tablet Take 150 mg by mouth daily.       . citalopram (CELEXA) 20 MG tablet Take 20 mg by mouth daily.       . clonazePAM (KLONOPIN) 0.5 MG tablet Take 1 mg by mouth 3 (three) times daily as needed. For nervousness      . fenofibrate 160 MG tablet Take 160 mg by mouth daily.       . Fluticasone-Salmeterol (ADVAIR) 100-50 MCG/DOSE AEPB Inhale 1 puff into the lungs every 12 (twelve) hours.      . folic acid (FOLVITE) 1 MG tablet Take 1 mg by mouth daily.       . furosemide (LASIX) 20 MG tablet Take 20 mg by mouth every other day. On Monday, Wednesday and Friday      . ipratropium-albuterol (DUONEB) 0.5-2.5 (3) MG/3ML SOLN Take 3 mLs by nebulization every 6 (six) hours as needed. For shortness of breath or wheezing      . isosorbide mononitrate  (IMDUR) 30 MG 24 hr tablet Take 30 mg by mouth daily.      . metoprolol tartrate (LOPRESSOR) 25 MG tablet Take 12.5 mg by mouth 2 (two) times daily.      . nitroGLYCERIN (NITROSTAT) 0.4 MG SL tablet Place 0.4 mg under the tongue every 5 (five) minutes as needed. For chest pain      . omeprazole (PRILOSEC) 20 MG capsule Take 20 mg by mouth.      . oxyCODONE-acetaminophen (PERCOCET) 10-325 MG per tablet Take 1 tablet by mouth every 6 (six) hours as needed. For pain      . OXYGEN-HELIUM IN Inhale into the lungs continuous. 2 liters /[place]       . pantoprazole (PROTONIX) 40 MG tablet Take 40 mg by mouth daily.       . promethazine (PHENERGAN) 25 MG tablet Take 12.5-25 mg by mouth every 6 (six) hours as needed. For nausea      . tiotropium (SPIRIVA) 18 MCG inhalation capsule Place 18 mcg into inhaler and inhale daily.       . traMADol (ULTRAM) 50 MG  tablet Take 50 mg by mouth 2 (two) times daily as needed. For pain      . Vitamin D, Ergocalciferol, (DRISDOL) 50000 UNITS CAPS Take 50,000 Units by mouth every 7 (seven) days. On Mondays      . warfarin (COUMADIN) 3 MG tablet Take 1.5-3 mg by mouth daily. The patient states that her most current regimen is 1 tablet (3 mg) daily EXCEPT for 0.5 tablet (1.5 mg) on Wednesday only.       Scheduled:    . albuterol  2.5 mg Nebulization Q6H  . atorvastatin  20 mg Oral Daily  . azithromycin  500 mg Intravenous Q24H  . budesonide  0.5 mg Nebulization BID  . buPROPion  150 mg Oral Daily  . citalopram  20 mg Oral Daily  . fenofibrate  160 mg Oral Daily  . folic acid  1 mg Oral Daily  . furosemide  20 mg Intravenous Daily  . ipratropium  0.5 mg Nebulization Q6H  . isosorbide mononitrate  30 mg Oral Daily  . metoprolol tartrate  12.5 mg Oral BID  . pantoprazole  40 mg Oral Q1200  . pantoprazole  40 mg Oral Daily  . potassium chloride  10 mEq Oral BID  . potassium chloride SA  40 mEq Oral Once  . sodium chloride  3 mL Intravenous Q12H  . sodium chloride  3 mL  Intravenous Q12H  . DISCONTD: albuterol  2.5 mg Nebulization Q4H  . DISCONTD: ipratropium  0.5 mg Nebulization Q4H    Assessment: 66yo female c/o CP and SOB that does not feel similar to prior MI, self-treating with increased frequency of nebs, to continue Coumadin for past DVT; admitted with supratherapeutic INR, pt states INR was 4.5 at PCP today, now at 3.75.  Goal of Therapy:  INR 2-3   Plan:  Will hold today's dose of Coumadin (last dose at home 8/4) and monitor INR for dose adjustments.  Colleen Can PharmD BCPS 11/23/2011,11:57 PM

## 2011-11-23 NOTE — ED Provider Notes (Signed)
History     CSN: [id number]  Arrival date & time 11/23/11  1606   First MD Initiated Contact with Patient 11/23/11 1641      Chief Complaint  Patient presents with  . Chest Pain  . Shortness of Breath    (Consider location/radiation/quality/duration/timing/severity/associated sxs/prior treatment) HPI Comments: Aimee Sanders 66 y.o. female   The chief complaint is: Patient presents with:   Chest Pain   Shortness of Breath   The patient has medical history significant for:   Past Medical History:   Myocardial infarction                                          Comment:times 2   CHF (congestive heart failure)                               Stroke                                                       Deep vein thrombosis                                           Comment:Phlebitis   Hyperlipidemia                                               Sleep apnea                                                  COPD (chronic obstructive pulmonary disease)                   Comment:02 dep chronically ; 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 INR was 4.5 at the time. Patient also c/o increasing SOB and over the past few days. Using neb treatments more  3-4 times a day over last 3 days.  Loss of appetite and nausea especially when eating./ No vomitting. Patient with history of abdominal surgeries and hiatal hernia. Denies chest pressure radiation or diaphoresis. This does not feel like previous MI. This is similar  to previous COPD exacerbations. Denies fever. Denies diarrhea or constipation.  Denies pleuritic CP.     The history is provided by the patient and medical records.    Past Medical History  Diagnosis Date  . Myocardial infarction     times 2    . CHF (congestive heart failure)   . Stroke   . Deep vein thrombosis     Phlebitis  . Hyperlipidemia   . Sleep apnea   . 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 Coronary artery disease   . Chronic pain     due to prior hip fracture and DDD back    Past Surgical History  Procedure Date  . Appendectomy   . Back surgery   . Cholecystectomy   . Coronary stent placement   . Breast surgery     benign tumors    Family History  Problem Relation Age of Onset  . Hypertension Mother   . Breast cancer Mother   . Rheum arthritis Maternal Grandmother     History  Substance Use Topics  . Smoking status: Current Everyday Smoker -- 1.0 packs/day for 40 years    Types: Cigarettes  . Smokeless tobacco: Not on file   Comment: started smoking in 1969  . Alcohol Use: No    OB History    Grav Para Term Preterm Abortions TAB SAB Ect Mult Living                  Review of Systems  Constitutional: Positive for appetite change. Negative for fever, chills, diaphoresis and fatigue.  HENT: Negative for congestion, neck pain and neck stiffness.   Respiratory: Positive for cough, chest tightness, shortness of breath and wheezing.   Cardiovascular: Positive for chest pain. Negative for palpitations and leg swelling.  Gastrointestinal: Positive for nausea. Negative for vomiting, abdominal pain, diarrhea, constipation, blood in stool, abdominal distention and anal bleeding.  Genitourinary: Negative for dysuria.  Musculoskeletal: Negative for myalgias and arthralgias.  Neurological: Negative for dizziness.  All other systems reviewed and are negative.    Allergies  Aspirin and Penicillins  Home Medications   Current Outpatient Rx  Name Route Sig Dispense Refill  . ATORVASTATIN CALCIUM 40 MG PO TABS Oral Take 20 mg by mouth daily.     . BUPROPION HCL ER (XL) 150 MG PO TB24 Oral Take 150 mg by mouth daily.     Marland Kitchen  CITALOPRAM HYDROBROMIDE 20 MG PO TABS Oral Take 20 mg by mouth daily.     Marland Kitchen CLONAZEPAM 0.5 MG PO TABS Oral Take 1 mg by mouth 3 (three) times daily as needed. For nervousness    . FENOFIBRATE 160 MG PO TABS Oral Take 160 mg by mouth daily.     Marland Kitchen FLUTICASONE-SALMETEROL 100-50 MCG/DOSE IN AEPB Inhalation Inhale 1 puff into the lungs every 12 (twelve) hours.    Marland Kitchen FOLIC ACID 1 MG PO TABS Oral Take 1 mg by mouth daily.     . FUROSEMIDE 20 MG PO TABS Oral Take 20 mg by mouth every other day. On Monday, Wednesday and Friday    . IPRATROPIUM-ALBUTEROL 0.5-2.5 (3) MG/3ML IN SOLN Nebulization Take 3 mLs by nebulization every 6 (six) hours as needed. For shortness of breath or wheezing    . ISOSORBIDE MONONITRATE ER 30 MG PO TB24 Oral Take 30 mg by mouth daily.    Marland Kitchen METOPROLOL TARTRATE 25 MG PO TABS  Oral Take 12.5 mg by mouth 2 (two) times daily.    Marland Kitchen NITROGLYCERIN 0.4 MG SL SUBL Sublingual Place 0.4 mg under the tongue every 5 (five) minutes as needed. For chest pain    . OMEPRAZOLE 20 MG PO CPDR Oral Take 20 mg by mouth.    . OXYCODONE-ACETAMINOPHEN 10-325 MG PO TABS Oral Take 1 tablet by mouth every 6 (six) hours as needed. For pain    . OXYGEN-HELIUM IN Inhalation Inhale into the lungs continuous. 2 liters /[place]     . PANTOPRAZOLE SODIUM 40 MG PO TBEC Oral Take 40 mg by mouth daily.     Marland Kitchen PROMETHAZINE HCL 25 MG PO TABS Oral Take 12.5-25 mg by mouth every 6 (six) hours as needed. For nausea    . TIOTROPIUM BROMIDE MONOHYDRATE 18 MCG IN CAPS Inhalation Place 18 mcg into inhaler and inhale daily.     . TRAMADOL HCL 50 MG PO TABS Oral Take 50 mg by mouth 2 (two) times daily as needed. For pain    . VITAMIN D (ERGOCALCIFEROL) 50000 UNITS PO CAPS Oral Take 50,000 Units by mouth every 7 (seven) days. On Mondays    . WARFARIN SODIUM 3 MG PO TABS Oral Take 1.5-3 mg by mouth daily. The patient states that her most current regimen is 1 tablet (3 mg) daily EXCEPT for 0.5 tablet (1.5 mg) on Wednesday only.      BP  125/72  Pulse 78  Temp 97.8 F (36.6 C) (Oral)  Resp 24  SpO2 99%  Physical Exam  Constitutional: She is oriented to person, place, and time.       Patient appears chronically ill and older than stated age. Pale, NAD, no diaphoresis. Work of breathing appears difficult.  HENT:  Head: Normocephalic and atraumatic.  Eyes: Conjunctivae are normal.  Neck: Normal range of motion. No JVD present.  Cardiovascular: Normal rate, regular rhythm and normal heart sounds.   Pulmonary/Chest: Accessory muscle usage present. No respiratory distress. She has wheezes. She has rhonchi.  Abdominal: Soft. Bowel sounds are normal. She exhibits no mass. There is no guarding.       Old well healed surgical incision, hernias noted . Mesh palpable.  Lymphadenopathy:    She has no cervical adenopathy.  Neurological: She is alert and oriented to person, place, and time.       Patient with chronic history of rapid nystagmus and right hand tremor.  Skin: Skin is warm and dry. There is pallor.    ED Course  Procedures (including critical care time)  Patient with SOB and wheezing. Takes DUONEB nebulizers at home. Will give neb treatment. Spoke with patient's PCP. INR was 4.5 today in the office.  Patient with xray in hand.   will get repeat CXR and PT/INR.  Will check hemoccult.  Results for orders placed during the hospital encounter of 11/23/11  CBC WITH DIFFERENTIAL      Component Value Range   WBC 9.2  4.0 - 10.5 K/uL   RBC 4.59  3.87 - 5.11 MIL/uL   Hemoglobin 13.9  12.0 - 15.0 g/dL   HCT 16.1  09.6 - 04.5 %   MCV 90.4  78.0 - 100.0 fL   MCH 30.3  26.0 - 34.0 pg   MCHC 33.5  30.0 - 36.0 g/dL   RDW 40.9  81.1 - 91.4 %   Platelets 315  150 - 400 K/uL   Neutrophils Relative 36 (*) 43 - 77 %   Neutro Abs  3.3  1.7 - 7.7 K/uL   Lymphocytes Relative 51 (*) 12 - 46 %   Lymphs Abs 4.7 (*) 0.7 - 4.0 K/uL   Monocytes Relative 10  3 - 12 %   Monocytes Absolute 0.9  0.1 - 1.0 K/uL   Eosinophils Relative 2   0 - 5 %   Eosinophils Absolute 0.2  0.0 - 0.7 K/uL   Basophils Relative 1  0 - 1 %   Basophils Absolute 0.1  0.0 - 0.1 K/uL  PROTIME-INR      Component Value Range   Prothrombin Time 37.6 (*) 11.6 - 15.2 seconds   INR 3.75 (*) 0.00 - 1.49   7:10 PM Patient breathing better after treatment.  Sating well on O2.   Digital Rectal Exam reveals sphincter with good tone. No external hemorrhoids. No masses or fissures. Stool color is brown with no overt blood. No anemia on labs. Patient with elevated lymphocytes and abnormal lymphocytes.  Mass noted on CXR. [place] on BNP and Hemoccult. Will admit patient.  DG Chest 2 View (Final result)   Result time:11/23/11 1844    Final result by Rad Results In Interface (11/23/11 18:44:40)    Narrative:   *RADIOLOGY REPORT*  Clinical Data: Chest pain, shortness of breath  CHEST - 2 VIEW  Comparison: 07/08/2011  Findings: Stable cardiomegaly and interstitial prominence but no CHF or edema. Background COPD/emphysema evident. No definite focal pneumonia, collapse, consolidation, effusion or pneumothorax. Trachea midline. Torturuous atherosclerotic aorta. Previous vertebral augmentation changes noted at three levels. Chronic compression fractures of the thoracic spine.  Nodular density measuring 14 x 9 mm along the left cardiac border noted. This appears new since prior study. Underlying nodule not excluded. Recommend follow-up non emergent chest CT.  IMPRESSION: Cardiomegaly with chronic interstitial changes and COPD.  No CHF or pneumonia  Left lower lobe versus lingula nodule. Please see above recommendation.  Original Report Authenticated By: Judie Petit. Ruel Favors, M.D.    No results found.   No diagnosis found.  5:36 PM Patient seen and assessed. Hypokalemia and hypercapnia with mildly elevated BNP.  CXR shows possible nodule on CXR.  Will admit patient for further evaluation.   MDM  Patient seen and admitted by  Hospitalists        Arthor Captain, PA-C 11/24/11 [phone]

## 2011-11-24 ENCOUNTER — Observation Stay (HOSPITAL_COMMUNITY): Payer: PRIVATE HEALTH INSURANCE

## 2011-11-24 DIAGNOSIS — R627 Adult failure to thrive: Secondary | ICD-10-CM

## 2011-11-24 DIAGNOSIS — J441 Chronic obstructive pulmonary disease with (acute) exacerbation: Secondary | ICD-10-CM

## 2011-11-24 LAB — BASIC METABOLIC PANEL
Calcium: 9.3 mg/dL (ref 8.4–10.5)
Calcium: 9.7 mg/dL (ref 8.4–10.5)
Creatinine, Ser: 0.87 mg/dL (ref 0.50–1.10)
GFR calc Af Amer: 79 mL/min — ABNORMAL LOW (ref >90)
GFR calc non Af Amer: 67 mL/min — ABNORMAL LOW (ref >90)
GFR calc non Af Amer: 68 mL/min — ABNORMAL LOW (ref >90)
Potassium: 3.5 mEq/L (ref 3.5–5.1)
Sodium: 140 mEq/L (ref 135–145)
Sodium: 141 mEq/L (ref 135–145)

## 2011-11-24 LAB — CARDIAC PANEL(CRET KIN+CKTOT+MB+TROPI)
CK, MB: 2.3 ng/mL (ref 0.3–4.0)
Relative Index: INVALID (ref 0.0–2.5)
Total CK: 42 U/L (ref 7–177)
Troponin I: <0.3 ng/mL (ref <0.30)
Troponin I: <0.3 ng/mL (ref <0.30)

## 2011-11-24 LAB — PROTIME-INR
INR: 3.51 — ABNORMAL HIGH (ref 0.00–1.49)
Prothrombin Time: 35.7 seconds — ABNORMAL HIGH (ref 11.6–15.2)

## 2011-11-24 MED ORDER — POTASSIUM CHLORIDE CRYS ER 20 MEQ PO TBCR
40.0000 meq | EXTENDED_RELEASE_TABLET | Freq: Once | ORAL | Status: AC
  Administered 2011-11-24: 40 meq via ORAL
  Filled 2011-11-24: qty 2

## 2011-11-24 MED ORDER — AZITHROMYCIN 500 MG PO TABS
500.0000 mg | ORAL_TABLET | Freq: Once | ORAL | Status: AC
  Administered 2011-11-24: 500 mg via ORAL
  Filled 2011-11-24: qty 1

## 2011-11-24 MED ORDER — ALBUTEROL SULFATE (5 MG/ML) 0.5% IN NEBU
2.5000 mg | INHALATION_SOLUTION | Freq: Three times a day (TID) | RESPIRATORY_TRACT | Status: DC
  Administered 2011-11-25 (×2): 2.5 mg via RESPIRATORY_TRACT
  Filled 2011-11-24 (×2): qty 0.5

## 2011-11-24 MED ORDER — OXYCODONE-ACETAMINOPHEN 5-325 MG PO TABS
1.0000 | ORAL_TABLET | Freq: Four times a day (QID) | ORAL | Status: DC | PRN
  Administered 2011-11-24: 1 via ORAL
  Filled 2011-11-24: qty 1

## 2011-11-24 MED ORDER — WARFARIN - PHARMACIST DOSING INPATIENT: Freq: Every day | Status: DC

## 2011-11-24 MED ORDER — AZITHROMYCIN 250 MG PO TABS
250.0000 mg | ORAL_TABLET | Freq: Every day | ORAL | Status: DC
  Administered 2011-11-25: 250 mg via ORAL
  Filled 2011-11-24: qty 1

## 2011-11-24 MED ORDER — PREDNISONE 20 MG PO TABS
40.0000 mg | ORAL_TABLET | Freq: Every day | ORAL | Status: DC
Start: 2011-11-25 — End: 2011-11-25
  Administered 2011-11-25: 40 mg via ORAL
  Filled 2011-11-24 (×2): qty 2

## 2011-11-24 MED ORDER — OXYCODONE HCL 5 MG PO TABS
5.0000 mg | ORAL_TABLET | Freq: Four times a day (QID) | ORAL | Status: DC | PRN
  Administered 2011-11-24: 5 mg via ORAL
  Filled 2011-11-24: qty 1

## 2011-11-24 MED ORDER — IPRATROPIUM BROMIDE 0.02 % IN SOLN
0.5000 mg | Freq: Three times a day (TID) | RESPIRATORY_TRACT | Status: DC
  Administered 2011-11-25 (×2): 0.5 mg via RESPIRATORY_TRACT
  Filled 2011-11-24 (×2): qty 2.5

## 2011-11-24 MED ORDER — BIOTENE DRY MOUTH MT LIQD
15.0000 mL | Freq: Two times a day (BID) | OROMUCOSAL | Status: DC
  Administered 2011-11-24 – 2011-11-25 (×3): 15 mL via OROMUCOSAL

## 2011-11-24 NOTE — Progress Notes (Signed)
  Echocardiogram 2D Echocardiogram has been performed.  Kameshia Madruga FRANCES 11/24/2011, 12:05 PM

## 2011-11-24 NOTE — Progress Notes (Signed)
Pt's IV began to burn after intitiated run of axithromycin (zitthromax). Pt's fluids were started at a lower rate. Pt called RN back in room about 40 mins after run of abx and stated her IV site was burning and felt like bees were stinging her. IV fluids were stopped STAT. Upon assessment site was noted to be red & edematous. Arm was elevated, heat was applied, IV was removed, & pt was given pain meds. IV team asked to come assess pt. Another nurse also assessed the pt's site. Pt stated she did not want any more zithromax nor did she want another site. Pt informed on the importance of having an IV in place. Will continue to monitor the pt. Sanda Linger

## 2011-11-24 NOTE — Progress Notes (Signed)
Pt still complaining of pain & burning at the site. Ice applied. Will continue to monitor. Sanda Linger

## 2011-11-24 NOTE — Progress Notes (Signed)
CSW met with pt at bedside to discuss advanced directives. Pt was sleeping however shared she was interested. Pt requested to discuss advanced directives later, as pt felt very groggy. CSW will follow up with pt later this afternoon to discuss advanced directives and complete assessment.   .Clinical social worker continuing to follow pt to assist with pt dc plans and further csw needs.   Catha Gosselin, LCSWA  [phone] .11/24/2011 1:58pm

## 2011-11-24 NOTE — Progress Notes (Signed)
[facility] PROGRESS NOTE  Aimee Sanders WUJ:[id number] DOB: 1945/10/04 DOA: 11/23/2011 PCP: Bennie Pierini, FNP  Assessment/Plan:  #1. Shortness of breath probably a combination of COPD and diastolic CHF - Patient has been continued on nebulizer, Pulmicort, placed on Zithromax, prednisone  and small dose of IV Lasix.  #2. Atypical chest pain in a patient with known history of CAD status post stenting - cardiac enzymes negative.  2-D echo pending.  #3. DVT on Coumadin with coagulopathy secondary to Coumadin - Coumadin per pharmacy.  #4. Hypokalemia- replete #5. History of CVA.-Stable  #6.  Nodule of left lung- CT chest negative   Code Status: Full code  Family Communication: Patient  Disposition Plan: Home when medically stable  Molli Posey, MD  [facility] Pager [phone]  If 8PM-8AM, please contact night-coverage [email or web address] Password [email or web address] 11/24/2011, 4:31 PM   LOS: 1 day   Brief narrative: Patient is a 66 year old white female with past medical history significant for COPD diastolic heart failure that presented with COPD exacerbation.  Consultants:  None  Procedures:  CT chest  Antibiotics:  azithromycin ?  HPI/Subjective: Breathing has improved  Objective: Filed Vitals:   11/24/11 1001 11/24/11 1049 11/24/11 1400 11/24/11 1417  BP: 97/67 116/76 103/68   Pulse: 78 76 66   Temp:   98.3 F (36.8 C)   TempSrc:   Oral   Resp:      Height:      Weight:      SpO2:   93% 93%    Intake/Output Summary (Last 24 hours) at 11/24/11 1631 Last data filed at 11/24/11 0700  Gross per 24 hour  Intake    261 ml  Output    401 ml  Net   -140 ml    Exam:   General: Patient is sitting up in bed she does not seem to be in any acute distress  Cardiovascular: Regular rate rhythm Respiratory: Minimal wheezing  Abdomen: Positive bowel sounds Extremities: No edema   Data Reviewed: Basic Metabolic Panel:  Lab 11/24/11 8657  11/23/11 1810  NA 140 142  K 2.9* 3.2*  CL 96 95*  CO2 39* 41*  GLUCOSE 119* 104*  BUN 12 8  CREATININE 0.87 0.80  CALCIUM 9.3 9.3  MG -- --  PHOS -- --   CBC:  Lab 11/23/11 1810  WBC 9.2  NEUTROABS 3.3  HGB 13.9  HCT 41.5  MCV 90.4  PLT 315   Cardiac Enzymes:  Lab 11/24/11 0820 11/24/11 0005  CKTOTAL 42 43  CKMB 2.3 2.0  CKMBINDEX -- --  TROPONINI <0.30 <0.30    Studies: Dg Chest 2 View  11/23/2011  *RADIOLOGY REPORT*  Clinical Data: Chest pain, shortness of breath  CHEST - 2 VIEW  Comparison: 07/08/2011  Findings: Stable cardiomegaly and interstitial prominence but no CHF or edema.  Background COPD/emphysema evident.  No definite focal pneumonia, collapse, consolidation, effusion or pneumothorax. Trachea midline.  Torturuous atherosclerotic aorta.  Previous vertebral augmentation changes noted at three levels.  Chronic compression fractures of the thoracic spine.  Nodular density measuring 14 x 9 mm along the left cardiac border noted.  This appears new since prior study.  Underlying nodule not excluded.  Recommend follow-up non emergent chest CT.  IMPRESSION: Cardiomegaly with chronic interstitial changes and COPD.  No CHF or pneumonia  Left lower lobe versus lingula nodule.  Please see above recommendation.  Original Report Authenticated By: Judie Petit. Ruel Favors, M.D.   Ct Chest Wo Contrast  11/24/2011  *  RADIOLOGY REPORT*  Clinical Data: Left lung nodule identified on chest radiograph 11/23/2011.  CT CHEST WITHOUT CONTRAST  Technique:  Multidetector CT imaging of the chest was performed following the standard protocol without IV contrast.  Comparison: Chest radiograph 11/23/2011 and chest and abdomen CT 06/13/2010  Findings: There is scattered atherosclerotic calcification within the normal caliber thoracic aorta.  Coronary artery atherosclerotic calcification is present.  Scattered normal sized mediastinal lymph nodes.  No mediastinal, hilar, supraclavicular, or axillary  lymphadenopathy.  Chronic scarring/atelectasis in the lingula is stable compared to prior chest CT of 06/13/2010.  There is stable mild linear scarring or atelectasis in the posterior left lower lobe near the costophrenic angle.  No discrete pulmonary nodule is seen in either lung.  There is no airspace disease.  No significant change compared to prior chest CT.  Images of the upper abdomen demonstrate normal bilateral adrenal glands.  Normal noncontrast appearance of the spleen and visualized portion of the liver (liver is not entirely imaged). Cholecystectomy clips are present.  The imaged portions of the pancreas and kidneys are within normal limits.  There are stable vertebroplasty changes at T7, T11, and T12.  A stable severe compression fracture of L1 is noted.  The posterior cortex of the L1 does buckle posteriorly, extending approximately 2- 3 mm into the ventral CSF space, stable.  No new fracture is identified.  IMPRESSION:  1.  Negative for pulmonary nodule or mass. 2.  Chronic scarring in the lingula and chronic scar and/or atelectasis in the left lower lobe.  It is possible that one of the areas of scarring could have simulated a nodule on the recent chest radiograph. 3.  Stable multilevel vertebroplasty changes, and chronic L1 vertebral body compression fracture.  Original Report Authenticated By: Britta Mccreedy, M.D.    Scheduled Meds:   . albuterol  2.5 mg Nebulization Q6H  . antiseptic oral rinse  15 mL Mouth Rinse BID  . atorvastatin  20 mg Oral Daily  . azithromycin  500 mg Oral Once   Followed by  . azithromycin  250 mg Oral Daily  . budesonide  0.5 mg Nebulization BID  . buPROPion  150 mg Oral Daily  . citalopram  20 mg Oral Daily  . fenofibrate  160 mg Oral Daily  . folic acid  1 mg Oral Daily  . furosemide  20 mg Intravenous Daily  . ipratropium  0.5 mg Nebulization Q6H  . isosorbide mononitrate  30 mg Oral Daily  . metoprolol tartrate  12.5 mg Oral BID  . pantoprazole  40 mg  Oral Q1200  . pantoprazole  40 mg Oral Daily  . potassium chloride  10 mEq Oral BID  . potassium chloride SA  40 mEq Oral Once  . potassium chloride  40 mEq Oral Once  . predniSONE  40 mg Oral Q breakfast  . sodium chloride  3 mL Intravenous Q12H  . sodium chloride  3 mL Intravenous Q12H  . Warfarin - Pharmacist Dosing Inpatient   Does not apply q1800  . DISCONTD: albuterol  2.5 mg Nebulization Q4H  . DISCONTD: azithromycin  500 mg Intravenous Q24H  . DISCONTD: ipratropium  0.5 mg Nebulization Q4H   Continuous Infusions:    Time Spent: 25 min

## 2011-11-24 NOTE — Progress Notes (Signed)
Utilization review complete 

## 2011-11-24 NOTE — ED Provider Notes (Signed)
Medical screening examination/treatment/procedure(s) were conducted as a shared visit with non-physician practitioner(s) and myself.  I personally evaluated the patient during the encounter  Doug Sou, MD 11/24/11 [phone]

## 2011-11-25 LAB — PROTIME-INR
INR: 3 — ABNORMAL HIGH (ref 0.00–1.49)
Prothrombin Time: 31.6 seconds — ABNORMAL HIGH (ref 11.6–15.2)

## 2011-11-25 LAB — BASIC METABOLIC PANEL
BUN: 10 mg/dL (ref 6–23)
CO2: 36 mEq/L — ABNORMAL HIGH (ref 19–32)
Calcium: 9.1 mg/dL (ref 8.4–10.5)
Chloride: 100 mEq/L (ref 96–112)
Creatinine, Ser: 0.81 mg/dL (ref 0.50–1.10)
GFR calc Af Amer: 85 mL/min — ABNORMAL LOW (ref >90)

## 2011-11-25 MED ORDER — WARFARIN 0.5 MG HALF TABLET: 1.5000 mg | ORAL_TABLET | ORAL | Status: DC

## 2011-11-25 MED ORDER — WARFARIN SODIUM 1 MG PO TABS
1.5000 mg | ORAL_TABLET | ORAL | Status: DC
  Filled 2011-11-25: qty 1

## 2011-11-25 MED ORDER — FUROSEMIDE 20 MG PO TABS
20.0000 mg | ORAL_TABLET | Freq: Every day | ORAL | Status: DC
  Administered 2011-11-25: 20 mg via ORAL
  Filled 2011-11-25: qty 1

## 2011-11-25 MED ORDER — AZITHROMYCIN 250 MG PO TABS: ORAL_TABLET | ORAL | Status: AC

## 2011-11-25 MED ORDER — FUROSEMIDE 20 MG PO TABS: 20.0000 mg | ORAL_TABLET | Freq: Every day | ORAL | Status: DC

## 2011-11-25 MED ORDER — WARFARIN SODIUM 3 MG PO TABS: 3.0000 mg | ORAL_TABLET | ORAL | Status: DC

## 2011-11-25 MED ORDER — ONDANSETRON HCL 4 MG/2ML IJ SOLN: 4.0000 mg | Freq: Four times a day (QID) | INTRAMUSCULAR | Status: DC | PRN

## 2011-11-25 MED ORDER — POTASSIUM CHLORIDE CRYS ER 10 MEQ PO TBCR: 10.0000 meq | EXTENDED_RELEASE_TABLET | Freq: Two times a day (BID) | ORAL | Status: DC

## 2011-11-25 MED ORDER — ONDANSETRON HCL 4 MG PO TABS: 4.0000 mg | ORAL_TABLET | Freq: Four times a day (QID) | ORAL | Status: AC | PRN

## 2011-11-25 MED ORDER — PREDNISONE 20 MG PO TABS: ORAL_TABLET | ORAL | Status: DC

## 2011-11-25 NOTE — Clinical Social Work Psychosocial (Signed)
     Clinical Social Work Department BRIEF PSYCHOSOCIAL ASSESSMENT 11/25/2011  Patient:  Aimee Sanders, Aimee Sanders     Account Number:  [id number]     Admit date:  11/23/2011  Clinical Social Worker:  Doree Albee  Date/Time:  11/25/2011 03:06 PM  Referred by:  RN  Date Referred:  11/25/2011 Referred for  Advanced Directives   Other Referral:   Interview type:  Patient Other interview type:    PSYCHOSOCIAL DATA Living Status:  ALONE Admitted from facility:   Level of care:   Primary support name:  Kerrie Pleasure Primary support relationship to patient:  CHILD, ADULT Degree of support available:   moderate    CURRENT CONCERNS Current Concerns  Other - See comment   Other Concerns:   adv directives    SOCIAL WORK ASSESSMENT / PLAN CSW met with pt at bedside to discuss advanced directives. CSW provided pt with education and discussed role of health care power of attorney and living will. Pt requested to take adv. directive home to discuss further iwth daughter.    Pt verbalized understanding of process to complete adv. directive including community notary and witnesses.   Assessment/plan status:  No Further Intervention Required Other assessment/ plan:   Information/referral to community resources:   adv. direct    PATIENTS/FAMILYS RESPONSE TO PLAN OF CARE: Pt thanked csw for concern and education regarding health care power of attorney and living will. pt plans to follow up with community notary after discussing further iwth pt daughter.

## 2011-11-25 NOTE — Progress Notes (Signed)
Pharmacist Heart Failure Core Measure Documentation  Assessment: Aimee Sanders has an EF documented as 40-45 on Echo by 11/24/11.  Rationale: Heart failure patients with left ventricular systolic dysfunction (LVSD) and an EF < 40% should be prescribed an angiotensin converting enzyme inhibitor (ACEI) or angiotensin receptor blocker (ARB) at discharge unless a contraindication is documented in the medical record.  This patient is not currently on an ACEI or ARB for HF.  This note is being placed in the record in order to provide documentation that a contraindication to the use of these agents is present for this encounter.  ACE Inhibitor or Angiotensin Receptor Blocker is contraindicated (specify all that apply)  []   ACEI allergy AND ARB allergy []   Angioedema []   Moderate or severe aortic stenosis []   Hyperkalemia [x]   Hypotension []   Renal artery stenosis []   Worsening renal function, preexisting renal disease or dysfunction   [name] 11/25/2011 11:41 AM

## 2011-11-25 NOTE — Care Management Note (Signed)
    Page 1 of 2   11/25/2011     3:05:19 PM   CARE MANAGEMENT NOTE 11/25/2011  Patient:  Aimee Sanders, Aimee Sanders   Account Number:  [id number]  Date Initiated:  11/25/2011  Documentation initiated by:  Donn Pierini  Subjective/Objective Assessment:   Pt admitted with COPD, CHF     Action/Plan:   PTA pt lived at home with spouse/family, independent- PT eval   Anticipated DC Date:  11/25/2011   Anticipated DC Plan:  HOME W HOME HEALTH SERVICES      DC Planning Services  CM consult      [facility] Choice  HOME HEALTH   Choice offered to / List presented to:  C-1 Patient        HH arranged  HH-1 RN  HH-2 PT      [facility] agency  [facility]   Status of service:  Completed, signed off Medicare Important Message given?   (If response is "NO", the following Medicare IM given date fields will be blank) Date Medicare IM given:   Date Additional Medicare IM given:    Discharge Disposition:  HOME W HOME HEALTH SERVICES  Per UR Regulation:  Reviewed for med. necessity/level of care/duration of stay  If discussed at Long Length of Stay Meetings, dates discussed:    Comments:  PCP- Bennie Pierini  11/25/11- 1500- Donn Pierini RN, BSN [phone] Pt for d/c today, orders for HH-RN/PT- spoke with pt at bedside- per conversation pt states she has a cane and walker at home, she is agreeable to [facility] services and states that she has used [facility] in past and would like to use them again for services. Referral sent to [facility] via [facility] and call made to [facility] with [facility]- services to begin within 24-48 hr. post discharge.

## 2011-11-25 NOTE — Discharge Summary (Signed)
Physician Discharge Summary  Aimee Sanders YNW:[id number] DOB: 10-17-1945 DOA: 11/23/2011  PCP: Bennie Pierini, FNP  Admit date: 11/23/2011 Discharge date: 11/25/2011  Recommendations for Outpatient Follow-up:  1. Pt/INR on Monday 2. BMP in 2 weeks  Discharge Diagnoses:  Principal Problem:  *COPD with acute exacerbation Active Problems:  HYPERLIPIDEMIA  C V A / STROKE  DEEP VEIN THROMBOSIS/PHLEBITIS  Chronic diastolic heart failure  Coagulopathy  Nodule of left lung   Discharge Condition: improved  Diet recommendation: cardiac  Wt Readings from Last 3 Encounters:  11/25/11 54.749 kg (120 lb 11.2 oz)  09/13/11 58.8 kg (129 lb 10.1 oz)  07/10/11 60.374 kg (133 lb 1.6 oz)    History of present illness:  66 year-old female with history of COPD on home oxygen, CAD status post stenting, DVT on Coumadin, CVA was experiencing increasing shortness of breath over the last 2-3 days. In addition patient is also noticed to have chest pain which he takes deep inspiration. Patient's shortness of breath increases on exertion. She also had checked her INR which was mildly supratherapeutic but patient did not have any rectal bleeding or any obvious bleeding source. Due to her complaints shows instructed to come to the ER. In the ER patient was found initially to be wheezing for which was given nebulizers and IV steroids. Cardiac enzymes have been negative. Chest x-ray shows nodule for which CT chest has been recommended. Patient has been admitted for further management.   Hospital Course:  Chronic respiratory failure with acute component resolved- on 3L O2 at home, continues to smoke, combo of COPD ex and CHF (acute systlic dfxn EF 65-78%)  Continue lasix daily instead of every other day.    DVT- coumadin with close monitoring as her INR has been high- decrease dose of coumadin- recheck soon  ?nodule in L lung- CT scan negative  H/o CVA- negative  Hypokalemia- repleated.    Spoke at  length with daughter who says she has care at home by another daughter but gets confused about her medications.  She would benefit from a home health nurse helping with medications.  She also continues to smoke.    Discharge Exam: Filed Vitals:   11/25/11 1044  BP: 100/63  Pulse: 66  Temp:   Resp:    Filed Vitals:   11/25/11 0653 11/25/11 0808 11/25/11 0827 11/25/11 1044  BP: 63/56 95/60  100/63  Pulse:    66  Temp:      TempSrc:      Resp:      Height:      Weight:      SpO2:   97%     General: A+Ox3, NAD- asking to go home Cardiovascular: rrr Respiratory: clear anteriorly Skin: no rashes or lesions  Discharge Instructions  Discharge Orders    Future Orders Please Complete By Expires   Diet - low sodium heart healthy      Increase activity slowly      Discharge instructions      Comments:   Continue 3L of home O2 STOP SMOKING   Discharge instructions      Comments:   PT/INR- check on Friday Home health- RN- education for medication BMP- Monday- re K and Cr     Medication List  As of 11/25/2011  1:42 PM   TAKE these medications         atorvastatin 40 MG tablet   Commonly known as: LIPITOR   Take 20 mg by mouth daily.  azithromycin 250 MG tablet   Commonly known as: ZITHROMAX   One PO daily      buPROPion 150 MG 24 hr tablet   Commonly known as: WELLBUTRIN XL   Take 150 mg by mouth daily.      citalopram 20 MG tablet   Commonly known as: CELEXA   Take 20 mg by mouth daily.      clonazePAM 0.5 MG tablet   Commonly known as: KLONOPIN   Take 1 mg by mouth 3 (three) times daily as needed. For nervousness      fenofibrate 160 MG tablet   Take 160 mg by mouth daily.      Fluticasone-Salmeterol 100-50 MCG/DOSE Aepb   Commonly known as: ADVAIR   Inhale 1 puff into the lungs every 12 (twelve) hours.      folic acid 1 MG tablet   Commonly known as: FOLVITE   Take 1 mg by mouth daily.      furosemide 20 MG tablet   Commonly known as: LASIX    Take 1 tablet (20 mg total) by mouth daily.      ipratropium-albuterol 0.5-2.5 (3) MG/3ML Soln   Commonly known as: DUONEB   Take 3 mLs by nebulization every 6 (six) hours as needed. For shortness of breath or wheezing      isosorbide mononitrate 30 MG 24 hr tablet   Commonly known as: IMDUR   Take 30 mg by mouth daily.      metoprolol tartrate 25 MG tablet   Commonly known as: LOPRESSOR   Take 12.5 mg by mouth 2 (two) times daily.      nitroGLYCERIN 0.4 MG SL tablet   Commonly known as: NITROSTAT   Place 0.4 mg under the tongue every 5 (five) minutes as needed. For chest pain      omeprazole 20 MG capsule   Commonly known as: PRILOSEC   Take 20 mg by mouth.      oxyCODONE-acetaminophen 10-325 MG per tablet   Commonly known as: PERCOCET   Take 1 tablet by mouth every 6 (six) hours as needed. For pain      OXYGEN-HELIUM IN   Inhale into the lungs continuous. 2 liters /[place]      pantoprazole 40 MG tablet   Commonly known as: PROTONIX   Take 40 mg by mouth daily.      potassium chloride 10 MEQ tablet   Commonly known as: K-DUR,KLOR-CON   Take 1 tablet (10 mEq total) by mouth 2 (two) times daily.      predniSONE 20 MG tablet   Commonly known as: DELTASONE   40 mg for 3 days, 30 mg for 4 days, 20 mg for 4 days, 10 mg for 4 days      promethazine 25 MG tablet   Commonly known as: PHENERGAN   Take 12.5-25 mg by mouth every 6 (six) hours as needed. For nausea      tiotropium 18 MCG inhalation capsule   Commonly known as: SPIRIVA   Place 18 mcg into inhaler and inhale daily.      traMADol 50 MG tablet   Commonly known as: ULTRAM   Take 50 mg by mouth 2 (two) times daily as needed. For pain      Vitamin D (Ergocalciferol) 50000 UNITS Caps   Commonly known as: DRISDOL   Take 50,000 Units by mouth every 7 (seven) days. On Mondays      warfarin 0.5 mg Tabs   Commonly known as:  COUMADIN   Take 1.5 tablets (1.5 mg total) by mouth every Monday, Wednesday, and Friday at 6 PM.        warfarin 3 MG tablet   Commonly known as: COUMADIN   Take 1 tablet (3 mg total) by mouth every Tuesday, Thursday, Saturday, and Sunday at 6 PM.           Follow-up Information    Follow up with Bennie Pierini, FNP in 1 week.   Contact information:   [address] [place] [address] [phone]           The results of significant diagnostics from this hospitalization (including imaging, microbiology, ancillary and laboratory) are listed below for reference.    Significant Diagnostic Studies: Dg Chest 2 View  11/23/2011  *RADIOLOGY REPORT*  Clinical Data: Chest pain, shortness of breath  CHEST - 2 VIEW  Comparison: 07/08/2011  Findings: Stable cardiomegaly and interstitial prominence but no CHF or edema.  Background COPD/emphysema evident.  No definite focal pneumonia, collapse, consolidation, effusion or pneumothorax. Trachea midline.  Torturuous atherosclerotic aorta.  Previous vertebral augmentation changes noted at three levels.  Chronic compression fractures of the thoracic spine.  Nodular density measuring 14 x 9 mm along the left cardiac border noted.  This appears new since prior study.  Underlying nodule not excluded.  Recommend follow-up non emergent chest CT.  IMPRESSION: Cardiomegaly with chronic interstitial changes and COPD.  No CHF or pneumonia  Left lower lobe versus lingula nodule.  Please see above recommendation.  Original Report Authenticated By: Judie Petit. Ruel Favors, M.D.   Ct Chest Wo Contrast  11/24/2011  *RADIOLOGY REPORT*  Clinical Data: Left lung nodule identified on chest radiograph 11/23/2011.  CT CHEST WITHOUT CONTRAST  Technique:  Multidetector CT imaging of the chest was performed following the standard protocol without IV contrast.  Comparison: Chest radiograph 11/23/2011 and chest and abdomen CT 06/13/2010  Findings: There is scattered atherosclerotic calcification within the normal caliber thoracic aorta.  Coronary artery  atherosclerotic calcification is present.  Scattered normal sized mediastinal lymph nodes.  No mediastinal, hilar, supraclavicular, or axillary lymphadenopathy.  Chronic scarring/atelectasis in the lingula is stable compared to prior chest CT of 06/13/2010.  There is stable mild linear scarring or atelectasis in the posterior left lower lobe near the costophrenic angle.  No discrete pulmonary nodule is seen in either lung.  There is no airspace disease.  No significant change compared to prior chest CT.  Images of the upper abdomen demonstrate normal bilateral adrenal glands.  Normal noncontrast appearance of the spleen and visualized portion of the liver (liver is not entirely imaged). Cholecystectomy clips are present.  The imaged portions of the pancreas and kidneys are within normal limits.  There are stable vertebroplasty changes at T7, T11, and T12.  A stable severe compression fracture of L1 is noted.  The posterior cortex of the L1 does buckle posteriorly, extending approximately 2- 3 mm into the ventral CSF space, stable.  No new fracture is identified.  IMPRESSION:  1.  Negative for pulmonary nodule or mass. 2.  Chronic scarring in the lingula and chronic scar and/or atelectasis in the left lower lobe.  It is possible that one of the areas of scarring could have simulated a nodule on the recent chest radiograph. 3.  Stable multilevel vertebroplasty changes, and chronic L1 vertebral body compression fracture.  Original Report Authenticated By: Britta Mccreedy, M.D.    Microbiology: No results found for this or any previous visit (from  the past 240 hour(s)).   Labs: Basic Metabolic Panel:  Lab 11/25/11 1610 11/24/11 1726 11/24/11 0820 11/23/11 1810  NA 143 141 140 142  K 3.9 3.5 2.9* 3.2*  CL 100 96 96 95*  CO2 36* 38* 39* 41*  GLUCOSE 90 113* 119* 104*  BUN 10 11 12 8   CREATININE 0.81 0.86 0.87 0.80  CALCIUM 9.1 9.7 9.3 9.3  MG -- 1.8 -- --  PHOS -- -- -- --   Liver Function Tests: No  results found for this basename: AST:5,ALT:5,ALKPHOS:5,BILITOT:5,PROT:5,ALBUMIN:5 in the last 168 hours No results found for this basename: LIPASE:5,AMYLASE:5 in the last 168 hours No results found for this basename: AMMONIA:5 in the last 168 hours CBC:  Lab 11/23/11 1810  WBC 9.2  NEUTROABS 3.3  HGB 13.9  HCT 41.5  MCV 90.4  PLT 315   Cardiac Enzymes:  Lab 11/24/11 1605 11/24/11 0820 11/24/11 0005  CKTOTAL 39 42 43  CKMB 2.4 2.3 2.0  CKMBINDEX -- -- --  TROPONINI <0.30 <0.30 <0.30   BNP: BNP (last 3 results)  Basename 11/23/11 1829 09/12/11 0650  PROBNP 462.7* 663.0*   CBG: No results found for this basename: GLUCAP:5 in the last 168 hours  Time coordinating discharge: 45 minutes  Signed:  Benjamine Mola JESSICA  [facility] 11/25/2011, 1:42 PM

## 2011-11-25 NOTE — Progress Notes (Signed)
ANTICOAGULATION CONSULT NOTE - Follow Up Consult  Pharmacy Consult for Coumadin Indication: CVA and h/o DVT  Allergies  Allergen Reactions  . Aspirin Other (See Comments)    Son does not remember reaction  . Penicillins Hives and Itching    Patient Measurements: Height: 5\' 2"  (157.5 cm) Weight: 120 lb 11.2 oz (54.749 kg) IBW/kg (Calculated) : 50.1  Heparin Dosing Weight:   Vital Signs: Temp: 98.4 F (36.9 C) (08/07 0500) Temp src: Oral (08/07 0500) BP: 95/60 mmHg (08/07 0808) Pulse Rate: 58  (08/07 0500)  Labs:  Basename 11/25/11 0545 11/24/11 1726 11/24/11 1605 11/24/11 0820 11/24/11 0005 11/23/11 1810  HGB -- -- -- -- -- 13.9  HCT -- -- -- -- -- 41.5  PLT -- -- -- -- -- 315  APTT -- -- -- -- -- --  LABPROT 31.6* -- -- 35.7* -- 37.6*  INR 3.00* -- -- 3.51* -- 3.75*  HEPARINUNFRC -- -- -- -- -- --  CREATININE 0.81 0.86 -- 0.87 -- --  CKTOTAL -- -- 39 42 43 --  CKMB -- -- 2.4 2.3 2.0 --  TROPONINI -- -- <0.30 <0.30 <0.30 --    Estimated Creatinine Clearance: 53.3 ml/min (by C-G formula based on Cr of 0.81).   Assessment: CP/SOB.   Anticoag: Chronic Coumadin for h/o DVT and CVA. patient with admit INR 3.75 on 3mg  daily except 1.5mg  on Wed. INR down to 3 today.   ID: Afebrile. SOB could be COPD vs CHF. Azithro #2/5 + nebs + Lasix.   Cards: CAD, HF. 95/60,58. EF 40-45%. Need ACEI for HF? BP running too low for now. Meds: IV Lasix, Lipitor, Fenofibrate, Imdur, metoprolol, K  GI: po PPI  Neuro: h/o CVA. Wellbutrin, Celexa,   Renal: Scr 0.81.  Pulm: COPD. Albuterol, Atrovent, Pulmicort, nebs scheduled, Prednisone  Goal of Therapy:  INR 2-3 Monitor platelets by anticoagulation protocol: Yes   Plan:  Resume Coumadin today 3mg  daily except 1.5mg  MWF  Merilynn Finland, [employer] 11/25/2011,8:28 AM

## 2011-11-25 NOTE — Clinical Documentation Improvement (Signed)
GENERIC DOCUMENTATION CLARIFICATION QUERY  THIS DOCUMENT IS NOT A PERMANENT PART OF THE MEDICAL RECORD  TO RESPOND TO THE THIS QUERY, FOLLOW THE INSTRUCTIONS BELOW:  1. If needed, update documentation for the patient's encounter via the notes activity.  2. Access this query again and click edit on the In [employer].  3. After updating, or not, click F2 to complete all highlighted (required) fields concerning your review. Select "additional documentation in the medical record" OR "no additional documentation provided".  4. Click Sign note button.  5. The deficiency will fall out of your In Basket *Please let us know if you are not able to complete this workflow by phone or e-mail (listed below).  Please update your documentation within the medical record to reflect your response to this query.                                                                                        11/25/11   Dear Dr. Earlene Plater  / Associates,  In a better effort to capture your patient's severity of illness/SOI, risk of mortality/ROM, reflect appropriate length of stay and utilization of resources, a review of the patient medical record has revealed the following indicators.  PLEASE CLARIFY IN NOTES AND DC SUMMARY "DIASTOLIC CHF". THANK YOU.  Possible Clinical Conditions? - Decompensated Diastolic CHF - Acute on Chronic Diastolic CHF - mild exacerbation Diastolic CHF  - Other Condition (please specify) - Cannot Clinically Determine  Supporting Information: - Risk Factors: hx Diastolic CHF, COPD on home O2, ongoing tobacco use - Signs & Symptoms: per H&P:"Shortness of breath probably a combination of COPD and diastolic CHF " - Diagnostics: 8/5 ProBNP: 462.7 - Treatment: check BNP, Lasix 20mg  po, mult nebs  You may use possible, probable, or suspect with inpatient documentation. possible, probable, suspected diagnoses MUST be documented at the time of discharge  Reviewed:  no additional documentation  provided  Thank You,  Beverley Fiedler RN Clinical Documentation Specialist: Pager: [phone] Health Information Management:  [phone] [facility]

## 2011-11-25 NOTE — Progress Notes (Signed)
MEDICARE-CERTIFIED HOME HEALTH AGENCIES [place]   Agencies that are Medicare-Certified and affiliated with [facility]  Home Health Agency  Telephone Number Address  [facility]  [facility] has ownership interest in this company; however, you are under no obligation to use this agency. [phone]  [address] [place], [place] [address]    Agencies that are Medicare-Certified and are not affiliated with [facility]   Home Health Agency Telephone Number Address  [facility] [phone] Fax [phone] [address] [place], [place]  [address]  [facility] [phone] [address] [place], [place] [address]  [facility]  [phone] Fax [phone] [address]  [place], [place]  [address]  [facility] [phone] or [phone] [address] [place], [place] [address]  [facility] [phone] or [phone] [address] [place], [place]  [address]      Agencies that are not Medicare-Certified and are not affiliated with [facility]    Home Health Agency Telephone Number Address  [facility] [phone] Fax [phone] [address] [place], [place]  [address]  [facility] [phone] or [phone] Fax [phone] [address] [place], [place]  [address]  [facility]  [phone] [address] [place], [place]  [facility] [phone] Fax [phone] [address] [place], [place]  [address]  [facility] [phone] Fax [phone] [address] [place], [place]  [address]  [facility] [phone] [address] [place], [place]  [address]  [facility] [phone] Fax [phone] [address] [place], [place] [address]  [facility] [phone] [address] [place], [place]  [address]  [facility] [phone] Fax [phone] [address], [address] , [place]  [address]    

## 2011-11-25 NOTE — Clinical Documentation Improvement (Signed)
RESPIRATORY FAILURE DOCUMENTATION CLARIFICATION QUERY   THIS DOCUMENT IS NOT A PERMANENT PART OF THE MEDICAL RECORD  TO RESPOND TO THE THIS QUERY, FOLLOW THE INSTRUCTIONS BELOW:  1. If needed, update documentation for the patient's encounter via the notes activity.  2. Access this query again and click edit on the In [employer].  3. After updating, or not, click F2 to complete all highlighted (required) fields concerning your review. Select "additional documentation in the medical record" OR "no additional documentation provided".  4. Click Sign note button.  5. The deficiency will fall out of your In Basket *Please let us know if you are not able to complete this workflow by phone or e-mail (listed below).  Please update your documentation within the medical record to reflect your response to this query.                                                                                    11/25/11  Dear Dr. Earlene Plater [facility] [place],  In a better effort to capture your patient's severity of illness/SOI, risk of mortality/ROM, reflect appropriate length of stay and utilization of resources, a review of the patient medical record has revealed the following indicators.  PLEASE CLARIFY IN NOTES AND DC SUMMARY "COPD DEPENDENT ON HOME O2". THANK YOU.  Possible Clinical Conditions? - Chronic Respiratory Failure - Other Condition (please specify) - Cannot Clinically Determine  Supporting Information: - Risk Factors: COPD dependent on home O2 - Signs&Symptoms: SOB increases on exertion, found initially to be wheezing for which was given nebulizers and IV steroids - Treatment: continued on nebulizer, Pulmicort, placed on Zithromax                  You may use possible, probable, or suspect with inpatient documentation. possible, probable, suspected diagnoses MUST be documented at the time of discharge  Reviewed: additional documentation in the medical record, it was documented in the d/c  summary  Thank You,  Beverley Fiedler RN Clinical Documentation Specialist: Pager:  [phone] Health Information Management:  [phone] [facility]

## 2011-12-07 DIAGNOSIS — J449 Chronic obstructive pulmonary disease, unspecified: Secondary | ICD-10-CM

## 2011-12-31 ENCOUNTER — Inpatient Hospital Stay (HOSPITAL_COMMUNITY)
Admission: EM | Admit: 2011-12-31 | Discharge: 2012-01-04 | DRG: 189 | Disposition: A | Discharge status: Home / Self Care | Payer: PRIVATE HEALTH INSURANCE | Attending: Family Medicine | Admitting: Family Medicine

## 2011-12-31 ENCOUNTER — Emergency Department (HOSPITAL_COMMUNITY): Payer: PRIVATE HEALTH INSURANCE

## 2011-12-31 ENCOUNTER — Encounter (HOSPITAL_COMMUNITY): Payer: Self-pay | Admitting: Radiology

## 2011-12-31 DIAGNOSIS — I6789 Other cerebrovascular disease: Secondary | ICD-10-CM

## 2011-12-31 DIAGNOSIS — Z23 Encounter for immunization: Secondary | ICD-10-CM

## 2011-12-31 DIAGNOSIS — Z7901 Long term (current) use of anticoagulants: Secondary | ICD-10-CM

## 2011-12-31 DIAGNOSIS — G4730 Sleep apnea, unspecified: Secondary | ICD-10-CM

## 2011-12-31 DIAGNOSIS — E876 Hypokalemia: Secondary | ICD-10-CM | POA: Diagnosis present

## 2011-12-31 DIAGNOSIS — I739 Peripheral vascular disease, unspecified: Secondary | ICD-10-CM

## 2011-12-31 DIAGNOSIS — Z8673 Personal history of transient ischemic attack (TIA), and cerebral infarction without residual deficits: Secondary | ICD-10-CM

## 2011-12-31 DIAGNOSIS — I219 Acute myocardial infarction, unspecified: Secondary | ICD-10-CM

## 2011-12-31 DIAGNOSIS — I252 Old myocardial infarction: Secondary | ICD-10-CM

## 2011-12-31 DIAGNOSIS — Z9981 Dependence on supplemental oxygen: Secondary | ICD-10-CM

## 2011-12-31 DIAGNOSIS — J9620 Acute and chronic respiratory failure, unspecified whether with hypoxia or hypercapnia: Principal | ICD-10-CM | POA: Diagnosis present

## 2011-12-31 DIAGNOSIS — I2510 Atherosclerotic heart disease of native coronary artery without angina pectoris: Secondary | ICD-10-CM | POA: Diagnosis present

## 2011-12-31 DIAGNOSIS — R911 Solitary pulmonary nodule: Secondary | ICD-10-CM

## 2011-12-31 DIAGNOSIS — G8929 Other chronic pain: Secondary | ICD-10-CM | POA: Diagnosis present

## 2011-12-31 DIAGNOSIS — I82509 Chronic embolism and thrombosis of unspecified deep veins of unspecified lower extremity: Secondary | ICD-10-CM | POA: Diagnosis present

## 2011-12-31 DIAGNOSIS — I509 Heart failure, unspecified: Secondary | ICD-10-CM | POA: Diagnosis present

## 2011-12-31 DIAGNOSIS — I5032 Chronic diastolic (congestive) heart failure: Secondary | ICD-10-CM

## 2011-12-31 DIAGNOSIS — J9610 Chronic respiratory failure, unspecified whether with hypoxia or hypercapnia: Secondary | ICD-10-CM

## 2011-12-31 DIAGNOSIS — F17200 Nicotine dependence, unspecified, uncomplicated: Secondary | ICD-10-CM | POA: Diagnosis present

## 2011-12-31 DIAGNOSIS — E785 Hyperlipidemia, unspecified: Secondary | ICD-10-CM | POA: Diagnosis present

## 2011-12-31 DIAGNOSIS — J441 Chronic obstructive pulmonary disease with (acute) exacerbation: Secondary | ICD-10-CM

## 2011-12-31 DIAGNOSIS — D689 Coagulation defect, unspecified: Secondary | ICD-10-CM

## 2011-12-31 DIAGNOSIS — I313 Pericardial effusion (noninflammatory): Secondary | ICD-10-CM

## 2011-12-31 DIAGNOSIS — J449 Chronic obstructive pulmonary disease, unspecified: Secondary | ICD-10-CM

## 2011-12-31 DIAGNOSIS — R6251 Failure to thrive (child): Secondary | ICD-10-CM

## 2011-12-31 DIAGNOSIS — I80299 Phlebitis and thrombophlebitis of other deep vessels of unspecified lower extremity: Secondary | ICD-10-CM

## 2011-12-31 LAB — PROTIME-INR: INR: 2.88 — ABNORMAL HIGH (ref 0.00–1.49)

## 2011-12-31 LAB — DIFFERENTIAL
Basophils Absolute: 0.1 K/uL (ref 0.0–0.1)
Basophils Relative: 1 % (ref 0–1)
Eosinophils Absolute: 0.3 K/uL (ref 0.0–0.7)
Eosinophils Relative: 2 % (ref 0–5)
Lymphocytes Relative: 21 % (ref 12–46)
Lymphs Abs: 2.3 K/uL (ref 0.7–4.0)
Monocytes Absolute: 1.4 K/uL — ABNORMAL HIGH (ref 0.1–1.0)
Monocytes Relative: 13 % — ABNORMAL HIGH (ref 3–12)
Neutro Abs: 6.7 K/uL (ref 1.7–7.7)
Neutrophils Relative %: 62 % (ref 43–77)

## 2011-12-31 LAB — CBC
HCT: 34.2 % — ABNORMAL LOW (ref 36.0–46.0)
Hemoglobin: 11.1 g/dL — ABNORMAL LOW (ref 12.0–15.0)
MCV: 92.9 fL (ref 78.0–100.0)
Platelets: 338 10*3/uL (ref 150–400)
RBC: 3.68 MIL/uL — ABNORMAL LOW (ref 3.87–5.11)
WBC: 10.7 10*3/uL — ABNORMAL HIGH (ref 4.0–10.5)

## 2011-12-31 LAB — POCT I-STAT 3, ART BLOOD GAS (G3+)
Bicarbonate: 43.9 mEq/L — ABNORMAL HIGH (ref 20.0–24.0)
TCO2: 46 mmol/L (ref 0–100)
pH, Arterial: 7.406 (ref 7.350–7.450)
pO2, Arterial: 56 mmHg — ABNORMAL LOW (ref 80.0–100.0)

## 2011-12-31 LAB — POCT I-STAT, CHEM 8
BUN: 5 mg/dL — ABNORMAL LOW (ref 6–23)
Chloride: 89 mEq/L — ABNORMAL LOW (ref 96–112)
Sodium: 135 mEq/L (ref 135–145)

## 2011-12-31 MED ORDER — ONDANSETRON HCL 4 MG/2ML IJ SOLN: 4.0000 mg | Freq: Four times a day (QID) | INTRAMUSCULAR | Status: DC | PRN

## 2011-12-31 MED ORDER — CITALOPRAM HYDROBROMIDE 20 MG PO TABS
20.0000 mg | ORAL_TABLET | Freq: Every day | ORAL | Status: DC
  Administered 2012-01-01 – 2012-01-04 (×4): 20 mg via ORAL
  Filled 2011-12-31 (×4): qty 1

## 2011-12-31 MED ORDER — IPRATROPIUM BROMIDE 0.02 % IN SOLN
0.5000 mg | Freq: Once | RESPIRATORY_TRACT | Status: AC
  Administered 2011-12-31: 0.5 mg via RESPIRATORY_TRACT
  Filled 2011-12-31: qty 2.5

## 2011-12-31 MED ORDER — LEVOFLOXACIN IN D5W 500 MG/100ML IV SOLN
500.0000 mg | INTRAVENOUS | Status: DC
  Filled 2011-12-31 (×2): qty 100

## 2011-12-31 MED ORDER — SENNA 8.6 MG PO TABS
1.0000 | ORAL_TABLET | Freq: Two times a day (BID) | ORAL | Status: DC
  Administered 2011-12-31 – 2012-01-04 (×7): 8.6 mg via ORAL
  Filled 2011-12-31 (×10): qty 1

## 2011-12-31 MED ORDER — CLONAZEPAM 1 MG PO TABS
1.0000 mg | ORAL_TABLET | Freq: Three times a day (TID) | ORAL | Status: DC | PRN
  Administered 2012-01-01 – 2012-01-04 (×8): 1 mg via ORAL
  Filled 2011-12-31 (×9): qty 1

## 2011-12-31 MED ORDER — TRAMADOL HCL 50 MG PO TABS
50.0000 mg | ORAL_TABLET | Freq: Two times a day (BID) | ORAL | Status: DC | PRN
  Administered 2011-12-31 – 2012-01-02 (×3): 50 mg via ORAL
  Filled 2011-12-31 (×3): qty 1

## 2011-12-31 MED ORDER — GUAIFENESIN-DM 100-10 MG/5ML PO SYRP
5.0000 mL | ORAL_SOLUTION | ORAL | Status: DC | PRN
  Administered 2012-01-01 – 2012-01-04 (×9): 5 mL via ORAL
  Filled 2011-12-31 (×9): qty 5

## 2011-12-31 MED ORDER — ALUM & MAG HYDROXIDE-SIMETH 200-200-20 MG/5ML PO SUSP
30.0000 mL | Freq: Four times a day (QID) | ORAL | Status: DC | PRN
  Filled 2011-12-31: qty 30

## 2011-12-31 MED ORDER — SODIUM CHLORIDE 0.9 % IV SOLN
INTRAVENOUS | Status: DC
  Administered 2011-12-31: 20:00:00 via INTRAVENOUS

## 2011-12-31 MED ORDER — ACETAMINOPHEN 325 MG PO TABS
650.0000 mg | ORAL_TABLET | Freq: Four times a day (QID) | ORAL | Status: DC | PRN
  Administered 2012-01-01 (×2): 650 mg via ORAL
  Filled 2011-12-31 (×2): qty 1
  Filled 2011-12-31: qty 2

## 2011-12-31 MED ORDER — WARFARIN - PHYSICIAN DOSING INPATIENT: Freq: Every day | Status: DC

## 2011-12-31 MED ORDER — VITAMIN D (ERGOCALCIFEROL) 1.25 MG (50000 UNIT) PO CAPS
50000.0000 [IU] | ORAL_CAPSULE | ORAL | Status: DC
  Administered 2012-01-04: 50000 [IU] via ORAL
  Filled 2011-12-31: qty 1

## 2011-12-31 MED ORDER — WARFARIN SODIUM 1 MG PO TABS: 1.5000 mg | ORAL_TABLET | ORAL | Status: DC

## 2011-12-31 MED ORDER — ISOSORBIDE MONONITRATE ER 30 MG PO TB24
30.0000 mg | ORAL_TABLET | Freq: Every day | ORAL | Status: DC
  Administered 2012-01-01 – 2012-01-04 (×4): 30 mg via ORAL
  Filled 2011-12-31 (×4): qty 1

## 2011-12-31 MED ORDER — ACETAMINOPHEN 650 MG RE SUPP: 650.0000 mg | Freq: Four times a day (QID) | RECTAL | Status: DC | PRN

## 2011-12-31 MED ORDER — ATORVASTATIN CALCIUM 20 MG PO TABS
20.0000 mg | ORAL_TABLET | Freq: Every day | ORAL | Status: DC
  Administered 2012-01-01 – 2012-01-04 (×4): 20 mg via ORAL
  Filled 2011-12-31 (×4): qty 1

## 2011-12-31 MED ORDER — POLYETHYLENE GLYCOL 3350 17 G PO PACK
17.0000 g | PACK | Freq: Every day | ORAL | Status: DC | PRN
  Filled 2011-12-31: qty 1

## 2011-12-31 MED ORDER — MOXIFLOXACIN HCL IN NACL 400 MG/250ML IV SOLN
400.0000 mg | INTRAVENOUS | Status: DC
  Administered 2011-12-31 – 2012-01-02 (×3): 400 mg via INTRAVENOUS
  Filled 2011-12-31 (×5): qty 250

## 2011-12-31 MED ORDER — ONDANSETRON HCL 4 MG PO TABS
4.0000 mg | ORAL_TABLET | Freq: Four times a day (QID) | ORAL | Status: DC | PRN
  Administered 2012-01-02 (×2): 4 mg via ORAL
  Filled 2011-12-31: qty 7
  Filled 2011-12-31: qty 1

## 2011-12-31 MED ORDER — METOPROLOL TARTRATE 12.5 MG HALF TABLET
12.5000 mg | ORAL_TABLET | Freq: Two times a day (BID) | ORAL | Status: DC
  Administered 2011-12-31 – 2012-01-04 (×7): 12.5 mg via ORAL
  Filled 2011-12-31 (×9): qty 1

## 2011-12-31 MED ORDER — FENOFIBRATE 160 MG PO TABS
160.0000 mg | ORAL_TABLET | Freq: Every day | ORAL | Status: DC
  Administered 2012-01-01 – 2012-01-04 (×4): 160 mg via ORAL
  Filled 2011-12-31 (×4): qty 1

## 2011-12-31 MED ORDER — ALBUTEROL SULFATE (5 MG/ML) 0.5% IN NEBU
2.5000 mg | INHALATION_SOLUTION | RESPIRATORY_TRACT | Status: DC | PRN
  Administered 2012-01-01: 2.5 mg via RESPIRATORY_TRACT
  Filled 2011-12-31: qty 0.5

## 2011-12-31 MED ORDER — BUPROPION HCL ER (XL) 150 MG PO TB24
150.0000 mg | ORAL_TABLET | Freq: Every day | ORAL | Status: DC
  Administered 2012-01-01 – 2012-01-04 (×4): 150 mg via ORAL
  Filled 2011-12-31 (×4): qty 1

## 2011-12-31 MED ORDER — PANTOPRAZOLE SODIUM 40 MG PO TBEC
40.0000 mg | DELAYED_RELEASE_TABLET | Freq: Every day | ORAL | Status: DC
  Administered 2012-01-01 – 2012-01-04 (×4): 40 mg via ORAL
  Filled 2011-12-31 (×4): qty 1

## 2011-12-31 MED ORDER — POTASSIUM CHLORIDE CRYS ER 10 MEQ PO TBCR
10.0000 meq | EXTENDED_RELEASE_TABLET | Freq: Two times a day (BID) | ORAL | Status: DC
  Administered 2011-12-31 – 2012-01-01 (×2): 10 meq via ORAL
  Filled 2011-12-31 (×3): qty 1

## 2011-12-31 MED ORDER — WARFARIN SODIUM 3 MG PO TABS
3.0000 mg | ORAL_TABLET | Freq: Once | ORAL | Status: AC
  Administered 2011-12-31: 3 mg via ORAL
  Filled 2011-12-31 (×2): qty 1

## 2011-12-31 MED ORDER — WARFARIN SODIUM 3 MG PO TABS: 3.0000 mg | ORAL_TABLET | ORAL | Status: DC

## 2011-12-31 MED ORDER — WARFARIN - PHARMACIST DOSING INPATIENT: Freq: Every day | Status: DC

## 2011-12-31 MED ORDER — FUROSEMIDE 20 MG PO TABS
20.0000 mg | ORAL_TABLET | Freq: Every day | ORAL | Status: DC
  Administered 2012-01-01: 20 mg via ORAL
  Filled 2011-12-31: qty 1

## 2011-12-31 MED ORDER — NITROGLYCERIN 0.4 MG SL SUBL: 0.4000 mg | SUBLINGUAL_TABLET | SUBLINGUAL | Status: DC | PRN

## 2011-12-31 MED ORDER — SODIUM CHLORIDE 0.9 % IV SOLN
INTRAVENOUS | Status: DC
  Administered 2011-12-31: 22:00:00 via INTRAVENOUS

## 2011-12-31 MED ORDER — ALBUTEROL SULFATE (5 MG/ML) 0.5% IN NEBU
5.0000 mg | INHALATION_SOLUTION | Freq: Once | RESPIRATORY_TRACT | Status: AC
  Administered 2011-12-31: 5 mg via RESPIRATORY_TRACT
  Filled 2011-12-31: qty 40

## 2011-12-31 MED ORDER — IPRATROPIUM BROMIDE 0.02 % IN SOLN
0.5000 mg | Freq: Four times a day (QID) | RESPIRATORY_TRACT | Status: DC
  Administered 2011-12-31 – 2012-01-04 (×15): 0.5 mg via RESPIRATORY_TRACT
  Filled 2011-12-31 (×15): qty 2.5

## 2011-12-31 MED ORDER — METHYLPREDNISOLONE SODIUM SUCC 125 MG IJ SOLR
80.0000 mg | Freq: Three times a day (TID) | INTRAMUSCULAR | Status: DC
  Administered 2011-12-31 – 2012-01-02 (×5): 80 mg via INTRAVENOUS
  Filled 2011-12-31 (×7): qty 1.28

## 2011-12-31 MED ORDER — ALBUTEROL SULFATE (5 MG/ML) 0.5% IN NEBU
2.5000 mg | INHALATION_SOLUTION | Freq: Four times a day (QID) | RESPIRATORY_TRACT | Status: DC
  Administered 2011-12-31 – 2012-01-04 (×15): 2.5 mg via RESPIRATORY_TRACT
  Filled 2011-12-31 (×16): qty 0.5

## 2011-12-31 MED ORDER — FOLIC ACID 1 MG PO TABS
1.0000 mg | ORAL_TABLET | Freq: Every day | ORAL | Status: DC
  Administered 2012-01-01 – 2012-01-04 (×4): 1 mg via ORAL
  Filled 2011-12-31 (×4): qty 1

## 2011-12-31 MED ORDER — BISACODYL 10 MG RE SUPP: 10.0000 mg | Freq: Every day | RECTAL | Status: DC | PRN

## 2011-12-31 NOTE — ED Notes (Signed)
MD at bedside.- consulting

## 2011-12-31 NOTE — ED Notes (Signed)
Pt presents with respiratory difficulty and chest pain X 2 days,. Pt was transferred from [facility] family medicine. Pt was given 125mg  of solumederol 1 atrovent 1 abuterol. Pt weare3l of O2 at home

## 2011-12-31 NOTE — ED Provider Notes (Signed)
History     CSN: [id number]  Arrival date & time 12/31/11  1512   First MD Initiated Contact with Patient 12/31/11 1528      Chief Complaint  Patient presents with  . Shortness of Breath    (Consider location/radiation/quality/duration/timing/severity/associated sxs/prior treatment) Patient is a 66 y.o. female presenting with shortness of breath. The history is provided by the patient.  Shortness of Breath  The current episode started 2 days ago. Associated symptoms include a fever, cough and shortness of breath. Pertinent negatives include no chest pain.   patient's had shortness of breath and cough the last couple days. She states she's had minimal production of sputum. She states she was told that she had fever when she got here. She was seen at [facility] family medicine, she was given Solu-Medrol Atrovent albuterol and was sent here. She's home oxygen dependent. No fevers. 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 She states that both her legs are previously swollen, however they both improved.  Past Medical History  Diagnosis Date  . Myocardial infarction     times 2  . CHF (congestive heart failure)   . Stroke   . Deep vein thrombosis     Phlebitis  . Hyperlipidemia   . Sleep apnea   . 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 Coronary artery disease   . Chronic pain     due to prior hip fracture and DDD back    Past Surgical History  Procedure Date  . Appendectomy   . Back surgery   . Cholecystectomy   . Coronary stent placement   . Breast surgery     benign tumors    Family History  Problem Relation Age of Onset  . Hypertension Mother   . Breast cancer Mother   . Rheum arthritis Maternal Grandmother     History  Substance Use Topics  . Smoking status: Current Every Day Smoker -- 1.0 packs/day for 40 years    Types: Cigarettes  . Smokeless tobacco: Not on file   Comment: started smoking  in 1969  . Alcohol Use: No    OB History    Grav Para Term Preterm Abortions TAB SAB Ect Mult Living                  Review of Systems  Constitutional: Positive for fever. Negative for activity change and appetite change.  HENT: Negative for neck stiffness.   Eyes: Negative for pain.  Respiratory: Positive for cough and shortness of breath. Negative for chest tightness.   Cardiovascular: Positive for leg swelling. Negative for chest pain.  Gastrointestinal: Negative for nausea, vomiting, abdominal pain and diarrhea.  Genitourinary: Negative for flank pain.  Musculoskeletal: Negative for back pain.  Skin: Negative for rash.  Neurological: Negative for weakness, numbness and headaches.  Psychiatric/Behavioral: Negative for behavioral problems.    Allergies  Aspirin and Penicillins  Home Medications   Current Outpatient Rx  Name Route Sig Dispense Refill  . ATORVASTATIN CALCIUM 40 MG PO TABS Oral Take 20 mg by mouth daily.     . BUPROPION HCL ER (XL) 150 MG PO TB24 Oral Take 150 mg by mouth daily.     Marland Kitchen CITALOPRAM HYDROBROMIDE 20 MG PO TABS Oral Take 20 mg by mouth daily.     Marland Kitchen CLONAZEPAM 0.5 MG PO TABS Oral Take 1 mg by mouth 3 (three) times daily as needed.  For nervousness    . FENOFIBRATE 160 MG PO TABS Oral Take 160 mg by mouth daily.     Marland Kitchen FLUTICASONE-SALMETEROL 100-50 MCG/DOSE IN AEPB Inhalation Inhale 1 puff into the lungs every 12 (twelve) hours.    Marland Kitchen FOLIC ACID 1 MG PO TABS Oral Take 1 mg by mouth daily.     . FUROSEMIDE 20 MG PO TABS Oral Take 1 tablet (20 mg total) by mouth daily. 30 tablet 0  . IPRATROPIUM-ALBUTEROL 0.5-2.5 (3) MG/3ML IN SOLN Nebulization Take 3 mLs by nebulization every 6 (six) hours as needed. For shortness of breath or wheezing    . ISOSORBIDE MONONITRATE ER 30 MG PO TB24 Oral Take 30 mg by mouth daily.    Marland Kitchen METOPROLOL TARTRATE 25 MG PO TABS Oral Take 12.5 mg by mouth 2 (two) times daily.    Marland Kitchen NITROGLYCERIN 0.4 MG SL SUBL Sublingual Place 0.4 mg  under the tongue every 5 (five) minutes as needed. For chest pain    . OMEPRAZOLE 20 MG PO CPDR Oral Take 20 mg by mouth.    . OXYCODONE-ACETAMINOPHEN 10-325 MG PO TABS Oral Take 1 tablet by mouth every 6 (six) hours as needed. For pain    . OXYGEN-HELIUM IN Inhalation Inhale into the lungs continuous. 2 liters /[place]     . PANTOPRAZOLE SODIUM 40 MG PO TBEC Oral Take 40 mg by mouth daily.     Marland Kitchen POTASSIUM CHLORIDE CRYS ER 10 MEQ PO TBCR Oral Take 1 tablet (10 mEq total) by mouth 2 (two) times daily. 60 tablet 0  . PREDNISONE 20 MG PO TABS  40 mg for 3 days, 30 mg for 4 days, 20 mg for 4 days, 10 mg for 4 days 36 tablet 0  . PROMETHAZINE HCL 25 MG PO TABS Oral Take 12.5-25 mg by mouth every 6 (six) hours as needed. For nausea    . TIOTROPIUM BROMIDE MONOHYDRATE 18 MCG IN CAPS Inhalation Place 18 mcg into inhaler and inhale daily.     . TRAMADOL HCL 50 MG PO TABS Oral Take 50 mg by mouth 2 (two) times daily as needed. For pain    . VITAMIN D (ERGOCALCIFEROL) 50000 UNITS PO CAPS Oral Take 50,000 Units by mouth every 7 (seven) days. On Mondays    . WARFARIN 0.5 MG HALF TABLET Oral Take 1.5 tablets (1.5 mg total) by mouth every Monday, Wednesday, and Friday at 6 PM. 20 tablet 0  . WARFARIN SODIUM 3 MG PO TABS Oral Take 1 tablet (3 mg total) by mouth every Tuesday, Thursday, Saturday, and Sunday at 6 PM. 20 tablet 0    BP 121/68  Pulse 104  Temp 100.1 F (37.8 C) (Oral)  Resp 29  SpO2 88%  Physical Exam  Nursing note and vitals reviewed. Constitutional: She is oriented to person, place, and time. She appears well-developed and well-nourished.  HENT:  Head: Normocephalic and atraumatic.  Eyes: EOM are normal. Pupils are equal, round, and reactive to light.  Neck: Normal range of motion. Neck supple.  Cardiovascular: Normal rate, regular rhythm and normal heart sounds.   No murmur heard. Pulmonary/Chest: Effort normal. No respiratory distress. She has wheezes. She has no rales.       Diffuse  wheezes and prolonged expirations.  Abdominal: Soft. Bowel sounds are normal. She exhibits no distension. There is no tenderness. There is no rebound and no guarding.  Musculoskeletal: Normal range of motion. She exhibits edema.       Edema to right  lower extremity. No edema left lower extremity.  Neurological: She is alert and oriented to person, place, and time. No cranial nerve deficit.  Skin: Skin is warm and dry.  Psychiatric: She has a normal mood and affect. Her speech is normal.    ED Course  Procedures (including critical care time)  Labs Reviewed  PROTIME-INR - Abnormal; Notable for the following:    Prothrombin Time 30.6 (*)     INR 2.88 (*)     All other components within normal limits  POCT I-STAT 3, BLOOD GAS (G3+) - Abnormal; Notable for the following:    pCO2 arterial 69.8 (*)     pO2, Arterial 56.0 (*)     Bicarbonate 43.9 (*)     Acid-Base Excess 16.0 (*)     All other components within normal limits  POCT I-STAT, CHEM 8 - Abnormal; Notable for the following:    Potassium 3.3 (*)     Chloride 89 (*)     BUN 5 (*)     Calcium, Ion 1.12 (*)     All other components within normal limits  CBC - Abnormal; Notable for the following:    WBC 10.7 (*)     RBC 3.68 (*)     Hemoglobin 11.1 (*)     HCT 34.2 (*)     RDW 15.6 (*)     All other components within normal limits  DIFFERENTIAL - Abnormal; Notable for the following:    Monocytes Relative 13 (*)     Monocytes Absolute 1.4 (*)     All other components within normal limits  TROPONIN I   Dg Chest 2 View  12/31/2011  *RADIOLOGY REPORT*  Clinical Data: Shortness of breath.  Smoker, hypertension.  CHEST - 2 VIEW  Comparison: 12/07/2011  Findings: Mild cardiomegaly. There is hyperinflation of the lungs compatible with COPD.  No confluent opacities or effusions. Multiple compression fractures in the thoracic and upper lumbar spine with three level vertebral augmentation.  Stable appearance of the compression  fractures.  IMPRESSION: Cardiomegaly, COPD.  No active disease.   Original Report Authenticated By: Cyndie Chime, M.D.      1. COPD with acute exacerbation      Date: 12/31/2011  Rate: 103  Rhythm: sinus tachycardia  QRS Axis: normal  Intervals: normal  ST/T Wave abnormalities: nonspecific ST/T changes  Conduction Disutrbances:none  Narrative Interpretation:   Old EKG Reviewed: unchanged    MDM  Patient with shortness of breath and wheezing. History of COPD. Previous histories of DVT, however the patient is therapeutic on Coumadin. She was initially moderately hypoxic. She is artery has steroids at the family practice office. She'll be admitted to medicine.        Juliet Rude. Rubin Payor, MD 12/31/11 1610

## 2011-12-31 NOTE — H&P (Addendum)
PATIENT DETAILS Name: Aimee Sanders Age: 66 y.o. Sex: female Date of Birth: [birth year] Admit Date: 12/31/2011 VHQ:IONGEX,BMWU MARGARET, FNP   CHIEF COMPLAINT:  Shortness of breath  HPI: Patient is a 66 year old Asian female with a past medical history of COPD on home oxygen, DVT on chronic Coumadin therapy, coronary artery disease who presents to the hospital for evaluation of the above-noted complaints. For the past 2-3 days patient has been feeling much more worse than baseline in terms of her breathing. She claims that the shortness of breath is particularly worse on exertion. This is associated with a productive cough with clear phlegm. She claims she's had a low-grade fever as well. She denies any chest pain. She claims that her right leg is chronically swollen and  almost unchanged. She was seen by her primary care practitioner today and referred to the ED for evaluation of shortness of breath. During my evaluation, she claims that she feels much better than what she was originally when she came to the ED. She is now being admitted to the hospital for further evaluation and treatment.   ALLERGIES:   Allergies  Allergen Reactions  . Aspirin Other (See Comments)    Son does not remember reaction  . Penicillins Hives and Itching    PAST MEDICAL HISTORY: Past Medical History  Diagnosis Date  . Myocardial infarction     times 2  . CHF (congestive heart failure)   . Stroke   . Deep vein thrombosis     Phlebitis  . Hyperlipidemia   . Sleep apnea   . 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 Coronary artery disease   . Chronic pain     due to prior hip fracture and DDD back    PAST SURGICAL HISTORY: Past Surgical History  Procedure Date  . Appendectomy   . Back surgery   . Cholecystectomy   . Coronary stent placement   . Breast surgery     benign tumors    MEDICATIONS AT HOME: Prior to Admission  medications   Medication Sig Start Date End Date Taking? Authorizing Provider  atorvastatin (LIPITOR) 40 MG tablet Take 20 mg by mouth daily.     Historical Provider, MD  buPROPion (WELLBUTRIN XL) 150 MG 24 hr tablet Take 150 mg by mouth daily.     Historical Provider, MD  citalopram (CELEXA) 20 MG tablet Take 20 mg by mouth daily.     Historical Provider, MD  clonazePAM (KLONOPIN) 0.5 MG tablet Take 1 mg by mouth 3 (three) times daily as needed. For nervousness    Historical Provider, MD  fenofibrate 160 MG tablet Take 160 mg by mouth daily.     Historical Provider, MD  Fluticasone-Salmeterol (ADVAIR) 100-50 MCG/DOSE AEPB Inhale 1 puff into the lungs every 12 (twelve) hours.    Historical Provider, MD  folic acid (FOLVITE) 1 MG tablet Take 1 mg by mouth daily.     Historical Provider, MD  furosemide (LASIX) 20 MG tablet Take 1 tablet (20 mg total) by mouth daily. 11/25/11 11/24/12  Joseph Art, DO  ipratropium-albuterol (DUONEB) 0.5-2.5 (3) MG/3ML SOLN Take 3 mLs by nebulization every 6 (six) hours as needed. For shortness of breath or wheezing    Historical Provider, MD  isosorbide mononitrate (IMDUR) 30 MG 24 hr tablet Take 30 mg by mouth daily. 04/06/11 04/05/12  June Leap, MD  metoprolol tartrate (LOPRESSOR) 25  MG tablet Take 12.5 mg by mouth 2 (two) times daily.    Historical Provider, MD  nitroGLYCERIN (NITROSTAT) 0.4 MG SL tablet Place 0.4 mg under the tongue every 5 (five) minutes as needed. For chest pain    Historical Provider, MD  omeprazole (PRILOSEC) 20 MG capsule Take 20 mg by mouth.    Historical Provider, MD  oxyCODONE-acetaminophen (PERCOCET) 10-325 MG per tablet Take 1 tablet by mouth every 6 (six) hours as needed. For pain    Historical Provider, MD  OXYGEN-HELIUM IN Inhale into the lungs continuous. 2 liters /[place]     Historical Provider, MD  pantoprazole (PROTONIX) 40 MG tablet Take 40 mg by mouth daily.     Historical Provider, MD  potassium chloride (K-DUR,KLOR-CON) 10 MEQ  tablet Take 1 tablet (10 mEq total) by mouth 2 (two) times daily. 11/25/11 11/24/12  Joseph Art, DO  predniSONE (DELTASONE) 20 MG tablet 40 mg for 3 days, 30 mg for 4 days, 20 mg for 4 days, 10 mg for 4 days 11/25/11   Joseph Art, DO  promethazine (PHENERGAN) 25 MG tablet Take 12.5-25 mg by mouth every 6 (six) hours as needed. For nausea    Historical Provider, MD  tiotropium (SPIRIVA) 18 MCG inhalation capsule Place 18 mcg into inhaler and inhale daily.     Historical Provider, MD  traMADol (ULTRAM) 50 MG tablet Take 50 mg by mouth 2 (two) times daily as needed. For pain    Historical Provider, MD  Vitamin D, Ergocalciferol, (DRISDOL) 50000 UNITS CAPS Take 50,000 Units by mouth every 7 (seven) days. On Mondays    Historical Provider, MD  warfarin (COUMADIN) 0.5 mg TABS Take 1.5 tablets (1.5 mg total) by mouth every Monday, Wednesday, and Friday at 6 PM. 11/25/11   Joseph Art, DO  warfarin (COUMADIN) 3 MG tablet Take 1 tablet (3 mg total) by mouth every Tuesday, Thursday, Saturday, and Sunday at 6 PM. 11/25/11 11/24/12  Joseph Art, DO    FAMILY HISTORY: Family History  Problem Relation Age of Onset  . Hypertension Mother   . Breast cancer Mother   . Rheum arthritis Maternal Grandmother     SOCIAL HISTORY:  reports that she has been smoking Cigarettes.  She has a 40 pack-year smoking history. She does not have any smokeless tobacco history on file. She reports that she does not drink alcohol or use illicit drugs.  REVIEW OF SYSTEMS:  Constitutional:   No  weight loss, night sweats,  Fevers, chills, fatigue.  HEENT:    No headaches, Difficulty swallowing,Tooth/dental problems,Sore throat,  No sneezing, itching, ear ache, nasal congestion, post nasal drip,   Cardio-vascular: No chest pain,  Orthopnea, PND, anasarca, dizziness, palpitations  GI:  No heartburn, indigestion, abdominal pain, nausea, vomiting, diarrhea, change in bowel habits, loss of appetite  Resp: No coughing up of  blood.No change in color of mucus.No chest wall deformity  Skin:  no rash or lesions.  GU:  no dysuria, change in color of urine, no urgency or frequency.  No flank pain.  Musculoskeletal: No joint pain or swelling.  No decreased range of motion.  No back pain.  Psych: No change in mood or affect. No depression or anxiety.  No memory loss.   PHYSICAL EXAM: Blood pressure 121/68, pulse 104, temperature 100.1 F (37.8 C), temperature source Oral, resp. rate 29, SpO2 88.00%.  General appearance :Awake, alert, not in any distress. Speech Clear. Not toxic Looking. She is easily able to  talk in full sentences, she is not using any of her accessory muscles of respiration. HEENT: Atraumatic and Normocephalic, pupils equally reactive to light and accomodation Neck: supple, no JVD. No cervical lymphadenopathy.  Chest:Good air entry bilaterally, diffuse rhonchi heard all over.  CVS: S1 S2 regular, no murmurs.  Abdomen: Bowel sounds present, Non tender and not distended with no gaurding, rigidity or rebound. Extremities:Right Lower Ext shows 2 +edema, both legs are warm to touch. Neurology: Awake alert, and oriented X 3, CN II-XII intact, Non focal Skin:No Rash Wounds:N/A  LABS ON ADMISSION:   Basename 12/31/11 1617  NA 135  K 3.3*  CL 89*  CO2 --  GLUCOSE 90  BUN 5*  CREATININE 1.00  CALCIUM --  MG --  PHOS --   No results found for this basename: AST:2,ALT:2,ALKPHOS:2,BILITOT:2,PROT:2,ALBUMIN:2 in the last 72 hours No results found for this basename: LIPASE:2,AMYLASE:2 in the last 72 hours  Basename 12/31/11 1649 12/31/11 1617  WBC 10.7* --  NEUTROABS 6.7 --  HGB 11.1* 12.2  HCT 34.2* 36.0  MCV 92.9 --  PLT 338 --    Basename 12/31/11 1557  CKTOTAL --  CKMB --  CKMBINDEX --  TROPONINI <0.30   No results found for this basename: DDIMER:2 in the last 72 hours No components found with this basename: POCBNP:3   RADIOLOGIC STUDIES ON ADMISSION: Dg Chest 2  View  12/31/2011  *RADIOLOGY REPORT*  Clinical Data: Shortness of breath.  Smoker, hypertension.  CHEST - 2 VIEW  Comparison: 12/07/2011  Findings: Mild cardiomegaly. There is hyperinflation of the lungs compatible with COPD.  No confluent opacities or effusions. Multiple compression fractures in the thoracic and upper lumbar spine with three level vertebral augmentation.  Stable appearance of the compression fractures.  IMPRESSION: Cardiomegaly, COPD.  No active disease.   Original Report Authenticated By: Cyndie Chime, M.D.     ASSESSMENT AND PLAN: Present on Admission:  .COPD with acute exacerbation -Will admit to telemetry, start on IV Solu-Medrol and scheduled nebulized bronchodilators.  -We'll monitor very closely and assess clinical response.  -Oxygen via nasal cannula as necessary to titrate oxygen saturation above 90%.  -Given that she has low-grade fever, although chest x-ray does not show any pneumonia-will empirically start on Avelox.  Marland KitchenRespiratory failure, acute-on-chronic -At home she is oxygen dependent-uses 2-4 L of oxygen by nasal cannula  -Currently she is very close to her usual oxygen requirement.   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  -Will have pharmacy dose Coumadin when she is inpatient.   .Chronic diastolic heart failure -He seems euvolemic and well compensated  -Continue with Lasix and Lopressor.   . history C V A / STROKE -She is allergic to aspirin, however is on Coumadin.  Further plan will depend as patient's clinical course evolves and further radiologic and laboratory data become available. Patient will be monitored closely.  DVT Prophylaxis: Not needed as on therapeutic anticoagulation with Coumadin.  Code Status: -Full code  Total time spent for admission equals 45 minutes.  [facility] Pager [phone]  If 7PM-7AM, please contact night-coverage [email or web address] Password [facility] 12/31/2011, 6:35 PM

## 2011-12-31 NOTE — ED Notes (Signed)
Confirmed no blood cultures ordered prior to antibiotic

## 2011-12-31 NOTE — Progress Notes (Signed)
ANTICOAGULATION CONSULT NOTE - Initial Consult  Pharmacy Consult for Coumadin Indication: h/o DVT  Allergies  Allergen Reactions  . Aspirin Other (See Comments)    Son does not remember reaction  . Penicillins Hives and Itching    Vital Signs: Temp: 99.1 F (37.3 C) (09/12 1937) Temp src: Oral (09/12 1937) BP: 113/60 mmHg (09/12 1937) Pulse Rate: 107  (09/12 1937)  Labs:  Basename 12/31/11 1649 12/31/11 1617 12/31/11 1557 12/31/11 1548  HGB 11.1* 12.2 -- --  HCT 34.2* 36.0 -- --  PLT 338 -- -- --  APTT -- -- -- --  LABPROT -- -- -- 30.6*  INR -- -- -- 2.88*  HEPARINUNFRC -- -- -- --  CREATININE -- 1.00 -- --  CKTOTAL -- -- -- --  CKMB -- -- -- --  TROPONINI -- -- <0.30 --    The CrCl is unknown because both a height and weight (above a minimum accepted value) are required for this calculation.   Medical History: Past Medical History  Diagnosis Date  . Myocardial infarction     times 2  . CHF (congestive heart failure)   . Stroke   . Deep vein thrombosis     Phlebitis  . Hyperlipidemia   . Sleep apnea   . 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 Coronary artery disease   . Chronic pain     due to prior hip fracture and DDD back    Medications:   (Not in a hospital admission) Coumadin 3mg  daily except 1.5mg  on MWF  Assessment: 66 y/o female patient admitted with COPD exacerbation, on chronic coumadin for h/o DVT. INR therapeutic, has not had dose today.  Goal of Therapy:  INR 2-3 Monitor platelets by anticoagulation protocol: Yes   Plan:  Coumadin 3mg  today and f/u daily protime.  Verlene Mayer, PharmD, BCPS Pager [phone] 12/31/2011,7:53 PM

## 2011-12-31 NOTE — ED Notes (Signed)
Requested antibiotic X 2

## 2012-01-01 ENCOUNTER — Encounter (HOSPITAL_COMMUNITY): Payer: Self-pay | Admitting: *Deleted

## 2012-01-01 DIAGNOSIS — I80299 Phlebitis and thrombophlebitis of other deep vessels of unspecified lower extremity: Secondary | ICD-10-CM

## 2012-01-01 DIAGNOSIS — J9610 Chronic respiratory failure, unspecified whether with hypoxia or hypercapnia: Secondary | ICD-10-CM

## 2012-01-01 LAB — COMPREHENSIVE METABOLIC PANEL
Alkaline Phosphatase: 68 U/L (ref 39–117)
BUN: 7 mg/dL (ref 6–23)
GFR calc Af Amer: >90 mL/min (ref >90)
Glucose, Bld: 131 mg/dL — ABNORMAL HIGH (ref 70–99)
Potassium: 3.2 mEq/L — ABNORMAL LOW (ref 3.5–5.1)
Total Bilirubin: 0.3 mg/dL (ref 0.3–1.2)
Total Protein: 5.6 g/dL — ABNORMAL LOW (ref 6.0–8.3)

## 2012-01-01 LAB — CBC
HCT: 32.7 % — ABNORMAL LOW (ref 36.0–46.0)
Hemoglobin: 10.6 g/dL — ABNORMAL LOW (ref 12.0–15.0)
MCHC: 32.4 g/dL (ref 30.0–36.0)

## 2012-01-01 LAB — GLUCOSE, CAPILLARY: Glucose-Capillary: 177 mg/dL — ABNORMAL HIGH (ref 70–99)

## 2012-01-01 LAB — PROTIME-INR: Prothrombin Time: 32 seconds — ABNORMAL HIGH (ref 11.6–15.2)

## 2012-01-01 LAB — TROPONIN I
Troponin I: <0.3 ng/mL (ref <0.30)
Troponin I: <0.3 ng/mL (ref <0.30)

## 2012-01-01 MED ORDER — FUROSEMIDE 10 MG/ML IJ SOLN
INTRAMUSCULAR | Status: AC
  Filled 2012-01-01: qty 4

## 2012-01-01 MED ORDER — PNEUMOCOCCAL VAC POLYVALENT 25 MCG/0.5ML IJ INJ
0.5000 mL | INJECTION | INTRAMUSCULAR | Status: AC
  Administered 2012-01-02: 0.5 mL via INTRAMUSCULAR
  Filled 2012-01-01: qty 0.5

## 2012-01-01 MED ORDER — FUROSEMIDE 10 MG/ML IJ SOLN
40.0000 mg | Freq: Once | INTRAMUSCULAR | Status: AC
  Administered 2012-01-01: 40 mg via INTRAVENOUS

## 2012-01-01 MED ORDER — BIOTENE DRY MOUTH MT LIQD
15.0000 mL | Freq: Two times a day (BID) | OROMUCOSAL | Status: DC
  Administered 2012-01-01 – 2012-01-04 (×4): 15 mL via OROMUCOSAL

## 2012-01-01 MED ORDER — INFLUENZA VIRUS VACC SPLIT PF IM SUSP
0.5000 mL | INTRAMUSCULAR | Status: AC
  Administered 2012-01-02: 0.5 mL via INTRAMUSCULAR
  Filled 2012-01-01: qty 0.5

## 2012-01-01 MED ORDER — POTASSIUM CHLORIDE CRYS ER 20 MEQ PO TBCR
40.0000 meq | EXTENDED_RELEASE_TABLET | Freq: Two times a day (BID) | ORAL | Status: AC
  Administered 2012-01-01 – 2012-01-02 (×3): 40 meq via ORAL
  Filled 2012-01-01 (×4): qty 2

## 2012-01-01 MED ORDER — WARFARIN SODIUM 1 MG PO TABS
1.0000 mg | ORAL_TABLET | Freq: Once | ORAL | Status: AC
  Administered 2012-01-01: 1 mg via ORAL
  Filled 2012-01-01: qty 1

## 2012-01-01 MED ORDER — FUROSEMIDE 10 MG/ML IJ SOLN
20.0000 mg | Freq: Two times a day (BID) | INTRAMUSCULAR | Status: DC
  Administered 2012-01-02 – 2012-01-03 (×3): 20 mg via INTRAVENOUS
  Filled 2012-01-01 (×5): qty 2

## 2012-01-01 NOTE — Progress Notes (Signed)
Pt c/o "not being able to catch her breath" O2 sat 90% on 5L [place]. Respiratory on floor and prn breathing treatment being received. BNP 2063  Dr. Sharl Ma notified. Orders received.

## 2012-01-01 NOTE — Progress Notes (Signed)
ANTICOAGULATION CONSULT NOTE - Follow up Consult  Pharmacy Consult for Coumadin Indication: h/o DVT  Allergies  Allergen Reactions  . Aspirin Other (See Comments)    Son does not remember reaction  . Penicillins Hives and Itching    Vital Signs: Temp: 98.2 F (36.8 C) (09/13 0500) Temp src: Oral (09/13 0500) BP: 110/66 mmHg (09/13 0900) Pulse Rate: 98  (09/13 0900)  Labs:  Basename 01/01/12 0935 01/01/12 0311 12/31/11 2122 12/31/11 1649 12/31/11 1617 12/31/11 1548  HGB -- 10.6* -- 11.1* -- --  HCT -- 32.7* -- 34.2* 36.0 --  PLT -- 305 -- 338 -- --  APTT -- -- -- -- -- --  LABPROT -- 32.0* -- -- -- 30.6*  INR -- 3.05* -- -- -- 2.88*  HEPARINUNFRC -- -- -- -- -- --  CREATININE -- 0.59 -- -- 1.00 --  CKTOTAL -- -- -- -- -- --  CKMB -- -- -- -- -- --  TROPONINI <0.30 <0.30 <0.30 -- -- --    Estimated Creatinine Clearance: 58.9 ml/min (by C-G formula based on Cr of 0.59).   Medical History: Past Medical History  Diagnosis Date  . Myocardial infarction     times 2  . CHF (congestive heart failure)   . Stroke   . Deep vein thrombosis     Phlebitis  . Hyperlipidemia   . Sleep apnea   . 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 Coronary artery disease   . Chronic pain     due to prior hip fracture and DDD back    Medications:  Prescriptions prior to admission  Medication Sig Dispense Refill  . atorvastatin (LIPITOR) 40 MG tablet Take 20 mg by mouth daily.       . citalopram (CELEXA) 20 MG tablet Take 20 mg by mouth daily.       . clonazePAM (KLONOPIN) 0.5 MG tablet Take 1 mg by mouth 3 (three) times daily as needed. For nervousness      . fenofibrate 160 MG tablet Take 160 mg by mouth daily.       . Fluticasone-Salmeterol (ADVAIR) 100-50 MCG/DOSE AEPB Inhale 1 puff into the lungs every 12 (twelve) hours.      . furosemide (LASIX) 40 MG tablet Take 40 mg by mouth 2 (two) times daily.      Marland Kitchen  gabapentin (NEURONTIN) 300 MG capsule Take 300 mg by mouth 3 (three) times daily.      Marland Kitchen ipratropium-albuterol (DUONEB) 0.5-2.5 (3) MG/3ML SOLN Take 3 mLs by nebulization every 6 (six) hours as needed. For shortness of breath or wheezing      . isosorbide mononitrate (IMDUR) 30 MG 24 hr tablet Take 30 mg by mouth daily.      . metoprolol tartrate (LOPRESSOR) 25 MG tablet Take 12.5 mg by mouth 2 (two) times daily.      . nitroGLYCERIN (NITROSTAT) 0.4 MG SL tablet Place 0.4 mg under the tongue every 5 (five) minutes as needed. For chest pain      . omeprazole (PRILOSEC) 20 MG capsule Take 20 mg by mouth.      . oxyCODONE-acetaminophen (PERCOCET) 10-325 MG per tablet Take 1 tablet by mouth every 6 (six) hours as needed. For pain      . promethazine (PHENERGAN) 25 MG tablet Take 12.5-25 mg by mouth every 6 (six) hours as needed. For nausea      . tiotropium (SPIRIVA)  18 MCG inhalation capsule Place 18 mcg into inhaler and inhale daily.       . traMADol (ULTRAM) 50 MG tablet Take 50 mg by mouth 2 (two) times daily as needed. For pain      . Vitamin D, Ergocalciferol, (DRISDOL) 50000 UNITS CAPS Take 50,000 Units by mouth every 7 (seven) days. On Mondays      . warfarin (COUMADIN) 1 MG tablet Take 1 mg by mouth as directed. Give on Monday Wednesday and Friday.      . warfarin (COUMADIN) 3 MG tablet Take 3 mg by mouth daily. Take on Tuesday Thursday and Saturday and Sunday         Assessment: 66 y/o female patient admitted with COPD exacerbation, on chronic coumadin for h/o DVT. Home dose of Coumadin is 3 mg daily except 1.5 mg MWF.  Of note, pt also started on moxifloxacin for COPD exacerbation.  Will give lower dose tonight as INR is borderline high (3.05). CBC relatively stable.  Goal of Therapy:  INR 2-3 Monitor platelets by anticoagulation protocol: Yes   Plan:  Coumadin 1 mg PO tonight Daily PT/INR   Nicolasa Ducking, PharmD Clinical Pharmacist Pgr [phone] 01/01/2012,12:16 PM

## 2012-01-01 NOTE — Progress Notes (Signed)
Subjective: Patient seen and examined, admitted with shortness of breath. Complains of worsening shortness of bretah today, BNP obtained was 2063, she does have h/o diastolic heart failure.   Objective: Vital signs in last 24 hours: Temp:  [97.8 F (36.6 C)-99.1 F (37.3 C)] 97.8 F (36.6 C) (09/13 1435) Pulse Rate:  [78-107] 86  (09/13 1435) Resp:  [18-22] 22  (09/13 1435) BP: (96-113)/(56-80) 102/57 mmHg (09/13 1435) SpO2:  [90 %-93 %] 91 % (09/13 1745) Weight:  [61.7 kg (136 lb 0.4 oz)] 61.7 kg (136 lb 0.4 oz) (09/13 0500) Weight change:  Last BM Date: 12/30/11  Intake/Output from previous day:   Total I/O In: 800 [P.O.:400; I.V.:400] Out: 300 [Urine:300]   Physical Exam: Head: Normocephalic, atraumatic.  Eyes: No signs of jaundice, EOMI Nose: Mucous membranes dry.  Neck: supple,No deformities, masses, or tenderness noted. Lungs: Bilateral rhonchi Heart: Regular RR. S1 and S2 normal  Abdomen: BS normoactive. Soft, Nondistended, non-tender.  Extremities: No pretibial edema, no erythema   Lab Results: Basic Metabolic Panel:  Basename 01/01/12 0311 12/31/11 1617  NA 140 135  K 3.2* 3.3*  CL 96 89*  CO2 39* --  GLUCOSE 131* 90  BUN 7 5*  CREATININE 0.59 1.00  CALCIUM 9.3 --  MG -- --  PHOS -- --   Liver Function Tests:  [facility] 01/01/12 0311  AST 34  ALT 18  ALKPHOS 68  BILITOT 0.3  PROT 5.6*  ALBUMIN 2.5*   No results found for this basename: LIPASE:2,AMYLASE:2 in the last 72 hours No results found for this basename: AMMONIA:2 in the last 72 hours CBC:  Basename 01/01/12 0311 12/31/11 1649  WBC 6.1 10.7*  NEUTROABS -- 6.7  HGB 10.6* 11.1*  HCT 32.7* 34.2*  MCV 91.3 92.9  PLT 305 338   Cardiac Enzymes:  Basename 01/01/12 0935 01/01/12 0311 12/31/11 2122  CKTOTAL -- -- --  CKMB -- -- --  CKMBINDEX -- -- --  TROPONINI <0.30 <0.30 <0.30   BNP:  Basename 01/01/12 1612  PROBNP 2063.0*   D-Dimer: No results found for this basename:  DDIMER:2 in the last 72 hours CBG:  Basename 01/01/12 1626 01/01/12 0747  GLUCAP 177* 143*   Coagulation:  Basename 01/01/12 0311 12/31/11 1548  LABPROT 32.0* 30.6*  INR 3.05* 2.88*     Studies/Results: Dg Chest 2 View  12/31/2011  *RADIOLOGY REPORT*  Clinical Data: Shortness of breath.  Smoker, hypertension.  CHEST - 2 VIEW  Comparison: 12/07/2011  Findings: Mild cardiomegaly. There is hyperinflation of the lungs compatible with COPD.  No confluent opacities or effusions. Multiple compression fractures in the thoracic and upper lumbar spine with three level vertebral augmentation.  Stable appearance of the compression fractures.  IMPRESSION: Cardiomegaly, COPD.  No active disease.   Original Report Authenticated By: Cyndie Chime, M.D.     Medications: Scheduled Meds:   . albuterol  2.5 mg Nebulization Q6H  . atorvastatin  20 mg Oral Daily  . buPROPion  150 mg Oral Daily  . citalopram  20 mg Oral Daily  . fenofibrate  160 mg Oral Daily  . folic acid  1 mg Oral Daily  . furosemide      . furosemide  40 mg Intravenous Once  . furosemide  20 mg Oral Daily  . ipratropium  0.5 mg Nebulization Q6H  . isosorbide mononitrate  30 mg Oral Daily  . methylPREDNISolone (SOLU-MEDROL) injection  80 mg Intravenous TID  . metoprolol tartrate  12.5 mg Oral BID  .  moxifloxacin  400 mg Intravenous Q24H  . pantoprazole  40 mg Oral Q1200  . potassium chloride  10 mEq Oral BID  . senna  1 tablet Oral BID  . Vitamin D (Ergocalciferol)  50,000 Units Oral Q7 days  . warfarin  1 mg Oral ONCE-1800  . warfarin  3 mg Oral Once  . Warfarin - Pharmacist Dosing Inpatient   Does not apply q1800  . DISCONTD: levofloxacin (LEVAQUIN) IV  500 mg Intravenous Q24H  . DISCONTD: warfarin  1.5 mg Oral Q M,W,F-1800  . DISCONTD: warfarin  3 mg Oral Q T,Th,S,Su-1800  . DISCONTD: Warfarin - Physician Dosing Inpatient   Does not apply q1800   Continuous Infusions:   . sodium chloride 10 mL/hr (01/01/12 1605)  .  DISCONTD: sodium chloride 125 mL/hr at 12/31/11 1939   PRN Meds:.acetaminophen, acetaminophen, albuterol, alum & mag hydroxide-simeth, bisacodyl, clonazePAM, guaiFENesin-dextromethorphan, nitroGLYCERIN, ondansetron (ZOFRAN) IV, ondansetron, polyethylene glycol, traMADol  Assessment/Plan:  Principal Problem:  *Respiratory failure, acute-on-chronic Active Problems:  HYPERLIPIDEMIA  SMOKER  C V A / STROKE  DEEP VEIN THROMBOSIS/PHLEBITIS  Chronic diastolic heart failure  COPD with acute exacerbation  Respiratory failure Multifactorial due to diastolic dysfunction and COPD exacerbation Will give one dose of lasix 40 mg iv x 1. Insert foley catheter  COPD exacerbation Continue iv solumedrol and nebulized bronchodilators.  DVT Patient is currently on coumadin with therapeutic INR  Diastolic dysfunction Will start lasix 20 mg IV q 12 hr     LOS: 1 day   [facility] Pager: [phone] 01/01/2012, 6:28 PM

## 2012-01-01 NOTE — Progress Notes (Signed)
Pt o2 sat 88 on 3 L per [place] increased to 4 L [place]

## 2012-01-02 DIAGNOSIS — I6789 Other cerebrovascular disease: Secondary | ICD-10-CM

## 2012-01-02 DIAGNOSIS — R079 Chest pain, unspecified: Secondary | ICD-10-CM

## 2012-01-02 LAB — BASIC METABOLIC PANEL
BUN: 11 mg/dL (ref 6–23)
CO2: 35 mEq/L — ABNORMAL HIGH (ref 19–32)
Chloride: 96 mEq/L (ref 96–112)
Glucose, Bld: 107 mg/dL — ABNORMAL HIGH (ref 70–99)
Potassium: 3.3 mEq/L — ABNORMAL LOW (ref 3.5–5.1)

## 2012-01-02 MED ORDER — POTASSIUM CHLORIDE CRYS ER 20 MEQ PO TBCR
40.0000 meq | EXTENDED_RELEASE_TABLET | Freq: Two times a day (BID) | ORAL | Status: AC
  Administered 2012-01-02 – 2012-01-03 (×3): 40 meq via ORAL
  Filled 2012-01-02 (×3): qty 2

## 2012-01-02 MED ORDER — OXYCODONE-ACETAMINOPHEN 5-325 MG PO TABS
1.0000 | ORAL_TABLET | ORAL | Status: DC | PRN
  Administered 2012-01-03 – 2012-01-04 (×5): 2 via ORAL
  Filled 2012-01-02 (×5): qty 2

## 2012-01-02 MED ORDER — METHYLPREDNISOLONE SODIUM SUCC 125 MG IJ SOLR
60.0000 mg | Freq: Three times a day (TID) | INTRAMUSCULAR | Status: DC
  Administered 2012-01-02 (×2): 60 mg via INTRAVENOUS
  Filled 2012-01-02 (×3): qty 0.96

## 2012-01-02 NOTE — Progress Notes (Signed)
Subjective: Patient seen and examined, admitted with shortness of breath. BNP was found to be elevated, good response with iv lasix. Also she takes percocet at home and wants her medication for the chronic back pain. Objective: Vital signs in last 24 hours: Temp:  [97.8 F (36.6 C)-98.4 F (36.9 C)] 98.4 F (36.9 C) (09/14 0500) Pulse Rate:  [81-94] 85  (09/14 0939) Resp:  [18-22] 18  (09/14 0500) BP: (102-108)/(43-57) 104/53 mmHg (09/14 0939) SpO2:  [90 %-93 %] 93 % (09/14 0911) Weight:  [60.147 kg (132 lb 9.6 oz)] 60.147 kg (132 lb 9.6 oz) (09/14 0500) Weight change: -1.553 kg (-3 lb 6.8 oz) Last BM Date: 12/28/11  Intake/Output from previous day: 09/13 0701 - 09/14 0700 In: 800 [P.O.:400; I.V.:400] Out: 1500 [Urine:1500] Total I/O In: 475 [P.O.:475] Out: -    Physical Exam: Head: Normocephalic, atraumatic.  Eyes: No signs of jaundice, EOMI Nose: Mucous membranes dry.  Neck: supple,No deformities, masses, or tenderness noted. Lungs: Bilateral rhonchi Heart: Regular RR. S1 and S2 normal  Abdomen: BS normoactive. Soft, Nondistended, non-tender.  Extremities: No pretibial edema, no erythema   Lab Results: Basic Metabolic Panel:  Basename 01/02/12 0530 01/01/12 0311  NA 139 140  K 3.3* 3.2*  CL 96 96  CO2 35* 39*  GLUCOSE 107* 131*  BUN 11 7  CREATININE 0.66 0.59  CALCIUM 9.1 9.3  MG -- --  PHOS -- --   Liver Function Tests:  [facility] 01/01/12 0311  AST 34  ALT 18  ALKPHOS 68  BILITOT 0.3  PROT 5.6*  ALBUMIN 2.5*   No results found for this basename: LIPASE:2,AMYLASE:2 in the last 72 hours No results found for this basename: AMMONIA:2 in the last 72 hours CBC:  Basename 01/01/12 0311 12/31/11 1649  WBC 6.1 10.7*  NEUTROABS -- 6.7  HGB 10.6* 11.1*  HCT 32.7* 34.2*  MCV 91.3 92.9  PLT 305 338   Cardiac Enzymes:  Basename 01/01/12 0935 01/01/12 0311 12/31/11 2122  CKTOTAL -- -- --  CKMB -- -- --  CKMBINDEX -- -- --  TROPONINI <0.30 <0.30 <0.30    BNP:  Basename 01/01/12 1612  PROBNP 2063.0*   D-Dimer: No results found for this basename: DDIMER:2 in the last 72 hours CBG:  Basename 01/02/12 0749 01/01/12 1626 01/01/12 0747  GLUCAP 116* 177* 143*   Coagulation:  Basename 01/02/12 0530 01/01/12 0311  LABPROT 36.2* 32.0*  INR 3.57* 3.05*     Studies/Results: Dg Chest 2 View  12/31/2011  *RADIOLOGY REPORT*  Clinical Data: Shortness of breath.  Smoker, hypertension.  CHEST - 2 VIEW  Comparison: 12/07/2011  Findings: Mild cardiomegaly. There is hyperinflation of the lungs compatible with COPD.  No confluent opacities or effusions. Multiple compression fractures in the thoracic and upper lumbar spine with three level vertebral augmentation.  Stable appearance of the compression fractures.  IMPRESSION: Cardiomegaly, COPD.  No active disease.   Original Report Authenticated By: Cyndie Chime, M.D.     Medications: Scheduled Meds:    . albuterol  2.5 mg Nebulization Q6H  . antiseptic oral rinse  15 mL Mouth Rinse BID  . atorvastatin  20 mg Oral Daily  . buPROPion  150 mg Oral Daily  . citalopram  20 mg Oral Daily  . fenofibrate  160 mg Oral Daily  . folic acid  1 mg Oral Daily  . furosemide      . furosemide  20 mg Intravenous Q12H  . furosemide  40 mg Intravenous Once  .  influenza  inactive virus vaccine  0.5 mL Intramuscular Tomorrow-1000  . ipratropium  0.5 mg Nebulization Q6H  . isosorbide mononitrate  30 mg Oral Daily  . methylPREDNISolone (SOLU-MEDROL) injection  80 mg Intravenous TID  . metoprolol tartrate  12.5 mg Oral BID  . moxifloxacin  400 mg Intravenous Q24H  . pantoprazole  40 mg Oral Q1200  . pneumococcal 23 valent vaccine  0.5 mL Intramuscular Tomorrow-1000  . potassium chloride  40 mEq Oral BID  . senna  1 tablet Oral BID  . Vitamin D (Ergocalciferol)  50,000 Units Oral Q7 days  . warfarin  1 mg Oral ONCE-1800  . Warfarin - Pharmacist Dosing Inpatient   Does not apply q1800  . DISCONTD:  furosemide  20 mg Oral Daily  . DISCONTD: potassium chloride  10 mEq Oral BID   Continuous Infusions:    . sodium chloride 10 mL/hr (01/01/12 1605)   PRN Meds:.acetaminophen, acetaminophen, albuterol, alum & mag hydroxide-simeth, bisacodyl, clonazePAM, guaiFENesin-dextromethorphan, nitroGLYCERIN, ondansetron (ZOFRAN) IV, ondansetron, polyethylene glycol, traMADol  Assessment/Plan:  Principal Problem:  *Respiratory failure, acute-on-chronic Active Problems:  HYPERLIPIDEMIA  SMOKER  C V A / STROKE  DEEP VEIN THROMBOSIS/PHLEBITIS  Chronic diastolic heart failure  COPD with acute exacerbation  Respiratory failure Multifactorial due to diastolic dysfunction and COPD exacerbation Continue with iv lasix 20 mg q 12 hr  foley catheter  COPD exacerbation Continue iv solumedrol and nebulized bronchodilators.  DVT Patient is currently on coumadin with therapeutic INR  Diastolic dysfunction  lasix 20 mg IV q 12 hr  Hypokalemia Replace potassium  Chronic back pain Will restart percocet 10/325 mg prn  at home dose.     LOS: 2 days   [facility] Pager: [phone] 01/02/2012, 12:02 PM

## 2012-01-02 NOTE — Progress Notes (Signed)
Nutrition Brief Note  Patient identified on the Malnutrition Screening Tool (MST) report for wt loss, generating a score of 2.   Body mass index is 22.76 kg/(m^2). Pt meets criteria for WNL based on current BMI.   RD attempted to meet with pt concerning MST results.  Pt opens eyes to voice, but does not uncover face to speak with this Clinical research associate- appears very sleeping.  Current diet order is Regular, patient is consuming approximately 40% of meals at this time. Pt with several snacks and regularly sweetened beverages at bedside table.  Wt hx reviewed and show pt's usual wt is 135 lbs.  Currently 98% usual wt.  Labs and medications reviewed.   No nutrition interventions warranted at this time, however RD not able to fully assess nutrition hx at this time. Will continue to follow and assess.   Loyce Dys, MS RD LDN Clinical Inpatient Dietitian Pager: [phone] Weekend/After hours pager: [phone]

## 2012-01-02 NOTE — Progress Notes (Signed)
Pt 02 sat was 88-92% on 4L. Pt asymptomatic. Pt denied any SOB or difficulty breathing. NP on call made aware no new orders received. Will cont to monitor pt.

## 2012-01-02 NOTE — Progress Notes (Signed)
ANTICOAGULATION CONSULT NOTE - Follow up Consult  Pharmacy Consult for Coumadin Indication: h/o DVT  Allergies  Allergen Reactions  . Aspirin Other (See Comments)    Son does not remember reaction  . Penicillins Hives and Itching    Vital Signs: Temp: 98.4 F (36.9 C) (09/14 0500) Temp src: Oral (09/14 0500) BP: 108/51 mmHg (09/14 0500) Pulse Rate: 81  (09/14 0500)  Labs:  Basename 01/02/12 0530 01/01/12 0935 01/01/12 0311 12/31/11 2122 12/31/11 1649 12/31/11 1617 12/31/11 1548  HGB -- -- 10.6* -- 11.1* -- --  HCT -- -- 32.7* -- 34.2* 36.0 --  PLT -- -- 305 -- 338 -- --  APTT -- -- -- -- -- -- --  LABPROT 36.2* -- 32.0* -- -- -- 30.6*  INR 3.57* -- 3.05* -- -- -- 2.88*  HEPARINUNFRC -- -- -- -- -- -- --  CREATININE 0.66 -- 0.59 -- -- 1.00 --  CKTOTAL -- -- -- -- -- -- --  CKMB -- -- -- -- -- -- --  TROPONINI -- <0.30 <0.30 <0.30 -- -- --    Estimated Creatinine Clearance: 58.9 ml/min (by C-G formula based on Cr of 0.66).   Medical History: Past Medical History  Diagnosis Date  . Myocardial infarction     times 2  . CHF (congestive heart failure)   . Stroke   . Deep vein thrombosis     Phlebitis  . Hyperlipidemia   . Sleep apnea   . 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 Coronary artery disease   . Chronic pain     due to prior hip fracture and DDD back    Medications:  Prescriptions prior to admission  Medication Sig Dispense Refill  . atorvastatin (LIPITOR) 40 MG tablet Take 20 mg by mouth daily.       . citalopram (CELEXA) 20 MG tablet Take 20 mg by mouth daily.       . clonazePAM (KLONOPIN) 0.5 MG tablet Take 1 mg by mouth 3 (three) times daily as needed. For nervousness      . fenofibrate 160 MG tablet Take 160 mg by mouth daily.       . Fluticasone-Salmeterol (ADVAIR) 100-50 MCG/DOSE AEPB Inhale 1 puff into the lungs every 12 (twelve) hours.      . furosemide (LASIX) 40 MG tablet  Take 40 mg by mouth 2 (two) times daily.      Marland Kitchen gabapentin (NEURONTIN) 300 MG capsule Take 300 mg by mouth 3 (three) times daily.      Marland Kitchen ipratropium-albuterol (DUONEB) 0.5-2.5 (3) MG/3ML SOLN Take 3 mLs by nebulization every 6 (six) hours as needed. For shortness of breath or wheezing      . isosorbide mononitrate (IMDUR) 30 MG 24 hr tablet Take 30 mg by mouth daily.      . metoprolol tartrate (LOPRESSOR) 25 MG tablet Take 12.5 mg by mouth 2 (two) times daily.      . nitroGLYCERIN (NITROSTAT) 0.4 MG SL tablet Place 0.4 mg under the tongue every 5 (five) minutes as needed. For chest pain      . omeprazole (PRILOSEC) 20 MG capsule Take 20 mg by mouth.      . oxyCODONE-acetaminophen (PERCOCET) 10-325 MG per tablet Take 1 tablet by mouth every 6 (six) hours as needed. For pain      . promethazine (PHENERGAN) 25 MG tablet Take 12.5-25 mg by mouth every 6 (six)  hours as needed. For nausea      . tiotropium (SPIRIVA) 18 MCG inhalation capsule Place 18 mcg into inhaler and inhale daily.       . traMADol (ULTRAM) 50 MG tablet Take 50 mg by mouth 2 (two) times daily as needed. For pain      . Vitamin D, Ergocalciferol, (DRISDOL) 50000 UNITS CAPS Take 50,000 Units by mouth every 7 (seven) days. On Mondays      . warfarin (COUMADIN) 1 MG tablet Take 1 mg by mouth as directed. Give on Monday Wednesday and Friday.      . warfarin (COUMADIN) 3 MG tablet Take 3 mg by mouth daily. Take on Tuesday Thursday and Saturday and Sunday         Assessment: 58 YOF patient admitted with COPD exacerbation on 9/12. Coumadin PTA for h/o DVT- home dose is 3 mg daily except 1.5 mg MWF. Patient started on moxifloxacin for COPD exacerbation. INR yesterday was slightly high, today is supratherapeutic at 3.57. CBC stable and no bleeding noted.  Goal of Therapy:  INR 2-3 Monitor platelets by anticoagulation protocol: Yes   Plan:  Hold warfarin tonight Daily PT/INR  Jaysion Ramseyer D. Nashton Belson, PharmD Clinical Pharmacist Pager:  [phone] Phone: [phone] 01/02/2012 8:28 AM

## 2012-01-03 LAB — BASIC METABOLIC PANEL
Chloride: 100 mEq/L (ref 96–112)
GFR calc Af Amer: >90 mL/min (ref >90)
Potassium: 3.9 mEq/L (ref 3.5–5.1)
Sodium: 139 mEq/L (ref 135–145)

## 2012-01-03 LAB — GLUCOSE, CAPILLARY: Glucose-Capillary: 112 mg/dL — ABNORMAL HIGH (ref 70–99)

## 2012-01-03 LAB — PROTIME-INR
INR: 3.72 — ABNORMAL HIGH (ref 0.00–1.49)
Prothrombin Time: 37.4 seconds — ABNORMAL HIGH (ref 11.6–15.2)

## 2012-01-03 MED ORDER — PREDNISONE 20 MG PO TABS
40.0000 mg | ORAL_TABLET | Freq: Every day | ORAL | Status: DC
  Administered 2012-01-03 – 2012-01-04 (×2): 40 mg via ORAL
  Filled 2012-01-03 (×3): qty 2

## 2012-01-03 MED ORDER — FUROSEMIDE 40 MG PO TABS
40.0000 mg | ORAL_TABLET | Freq: Every day | ORAL | Status: DC
  Administered 2012-01-03 – 2012-01-04 (×2): 40 mg via ORAL
  Filled 2012-01-03 (×2): qty 1

## 2012-01-03 MED ORDER — MOXIFLOXACIN HCL 400 MG PO TABS
400.0000 mg | ORAL_TABLET | Freq: Every day | ORAL | Status: DC
  Administered 2012-01-03: 400 mg via ORAL
  Filled 2012-01-03 (×2): qty 1

## 2012-01-03 NOTE — Progress Notes (Signed)
PT Cancellation Note  Treatment cancelled today due to patient's refusal to participate.  Attempted to see patient x2 - patient refused due to pain and too SOB.  Will return in am for PT eval  Vena Austria 01/03/2012, 5:06 PM [phone]

## 2012-01-03 NOTE — Progress Notes (Signed)
Subjective: Patient seen and examined, admitted with shortness of breath. BNP was found to be elevated, good response with iv lasix. Also she takes percocet at home and wants her medication for the chronic back pain.patient says she is breathing better. Objective: Vital signs in last 24 hours: Temp:  [98 F (36.7 C)-98.2 F (36.8 C)] 98 F (36.7 C) (09/15 0500) Pulse Rate:  [74-90] 81  (09/15 0500) Resp:  [20-24] 20  (09/15 0500) BP: (108-134)/(53-75) 115/75 mmHg (09/15 0500) SpO2:  [90 %-94 %] 92 % (09/15 0908) Weight change:  Last BM Date: 01/02/12  Intake/Output from previous day: 09/14 0701 - 09/15 0700 In: 835 [P.O.:835] Out: 1650 [Urine:1650]     Physical Exam: Head: Normocephalic, atraumatic.  Eyes: No signs of jaundice, EOMI Nose: Mucous membranes dry.  Neck: supple,No deformities, masses, or tenderness noted. Lungs: Bilateral rhonchi Heart: Regular RR. S1 and S2 normal  Abdomen: BS normoactive. Soft, Nondistended, non-tender.  Extremities: No pretibial edema, no erythema   Lab Results: Basic Metabolic Panel:  Basename 01/03/12 0545 01/02/12 0530  NA 139 139  K 3.9 3.3*  CL 100 96  CO2 36* 35*  GLUCOSE 114* 107*  BUN 14 11  CREATININE 0.76 0.66  CALCIUM 9.1 9.1  MG -- --  PHOS -- --   Liver Function Tests:  [facility] 01/01/12 0311  AST 34  ALT 18  ALKPHOS 68  BILITOT 0.3  PROT 5.6*  ALBUMIN 2.5*   No results found for this basename: LIPASE:2,AMYLASE:2 in the last 72 hours No results found for this basename: AMMONIA:2 in the last 72 hours CBC:  Basename 01/01/12 0311 12/31/11 1649  WBC 6.1 10.7*  NEUTROABS -- 6.7  HGB 10.6* 11.1*  HCT 32.7* 34.2*  MCV 91.3 92.9  PLT 305 338   Cardiac Enzymes:  Basename 01/01/12 0935 01/01/12 0311 12/31/11 2122  CKTOTAL -- -- --  CKMB -- -- --  CKMBINDEX -- -- --  TROPONINI <0.30 <0.30 <0.30   BNP:  Basename 01/01/12 1612  PROBNP 2063.0*   D-Dimer: No results found for this basename: DDIMER:2 in  the last 72 hours CBG:  Basename 01/03/12 0742 01/02/12 0749 01/01/12 1626 01/01/12 0747  GLUCAP 112* 116* 177* 143*   Coagulation:  Basename 01/03/12 0545 01/02/12 0530  LABPROT 37.4* 36.2*  INR 3.72* 3.57*     Studies/Results: No results found.  Medications: Scheduled Meds:    . albuterol  2.5 mg Nebulization Q6H  . antiseptic oral rinse  15 mL Mouth Rinse BID  . atorvastatin  20 mg Oral Daily  . buPROPion  150 mg Oral Daily  . citalopram  20 mg Oral Daily  . fenofibrate  160 mg Oral Daily  . folic acid  1 mg Oral Daily  . furosemide  40 mg Oral Daily  . influenza  inactive virus vaccine  0.5 mL Intramuscular Tomorrow-1000  . ipratropium  0.5 mg Nebulization Q6H  . isosorbide mononitrate  30 mg Oral Daily  . metoprolol tartrate  12.5 mg Oral BID  . moxifloxacin  400 mg Oral Q2000  . pantoprazole  40 mg Oral Q1200  . pneumococcal 23 valent vaccine  0.5 mL Intramuscular Tomorrow-1000  . potassium chloride  40 mEq Oral BID  . potassium chloride  40 mEq Oral BID  . predniSONE  40 mg Oral Q breakfast  . senna  1 tablet Oral BID  . Vitamin D (Ergocalciferol)  50,000 Units Oral Q7 days  . Warfarin - Pharmacist Dosing Inpatient   Does  not apply q1800  . DISCONTD: furosemide  20 mg Intravenous Q12H  . DISCONTD: methylPREDNISolone (SOLU-MEDROL) injection  60 mg Intravenous TID  . DISCONTD: methylPREDNISolone (SOLU-MEDROL) injection  80 mg Intravenous TID  . DISCONTD: moxifloxacin  400 mg Intravenous Q24H   Continuous Infusions:    . sodium chloride 10 mL/hr (01/01/12 1605)   PRN Meds:.acetaminophen, acetaminophen, albuterol, alum & mag hydroxide-simeth, bisacodyl, clonazePAM, guaiFENesin-dextromethorphan, nitroGLYCERIN, ondansetron (ZOFRAN) IV, ondansetron, oxyCODONE-acetaminophen, polyethylene glycol, traMADol  Assessment/Plan:  Principal Problem:  *Respiratory failure, acute-on-chronic Active Problems:  HYPERLIPIDEMIA  SMOKER  C V A / STROKE  DEEP VEIN  THROMBOSIS/PHLEBITIS  Chronic diastolic heart failure  COPD with acute exacerbation  Respiratory failure Multifactorial due to diastolic dysfunction and COPD exacerbation Will change the IV lasix to po.  foley catheter  COPD exacerbation Change  iv solumedrol to po prednisone Nebulized bronchodilators.  DVT Patient is currently on coumadin with therapeutic INR  Diastolic dysfunction  lasix 40 mg po daily  Hypokalemia Replace potassium  Chronic back pain Will restart percocet 10/325 mg prn  at home dose.     LOS: 3 days   [facility] Pager: [phone] 01/03/2012, 10:25 AM

## 2012-01-03 NOTE — Progress Notes (Signed)
ANTICOAGULATION CONSULT NOTE - Follow up Consult  Pharmacy Consult for Coumadin Indication: h/o DVT  Allergies  Allergen Reactions  . Aspirin Other (See Comments)    Son does not remember reaction  . Penicillins Hives and Itching    Vital Signs: Temp: 98 F (36.7 C) (09/15 0500) BP: 115/75 mmHg (09/15 0500) Pulse Rate: 81  (09/15 0500)  Labs:  Alvira Philips 01/03/12 0545 01/02/12 0530 01/01/12 0935 01/01/12 0311 12/31/11 2122 12/31/11 1649 12/31/11 1617  HGB -- -- -- 10.6* -- 11.1* --  HCT -- -- -- 32.7* -- 34.2* 36.0  PLT -- -- -- 305 -- 338 --  APTT -- -- -- -- -- -- --  LABPROT 37.4* 36.2* -- 32.0* -- -- --  INR 3.72* 3.57* -- 3.05* -- -- --  HEPARINUNFRC -- -- -- -- -- -- --  CREATININE 0.76 0.66 -- 0.59 -- -- --  CKTOTAL -- -- -- -- -- -- --  CKMB -- -- -- -- -- -- --  TROPONINI -- -- <0.30 <0.30 <0.30 -- --    Estimated Creatinine Clearance: 58.9 ml/min (by C-G formula based on Cr of 0.76).   Medical History: Past Medical History  Diagnosis Date  . Myocardial infarction     times 2  . CHF (congestive heart failure)   . Stroke   . Deep vein thrombosis     Phlebitis  . Hyperlipidemia   . Sleep apnea   . 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 Coronary artery disease   . Chronic pain     due to prior hip fracture and DDD back    Medications:  Prescriptions prior to admission  Medication Sig Dispense Refill  . atorvastatin (LIPITOR) 40 MG tablet Take 20 mg by mouth daily.       . citalopram (CELEXA) 20 MG tablet Take 20 mg by mouth daily.       . clonazePAM (KLONOPIN) 0.5 MG tablet Take 1 mg by mouth 3 (three) times daily as needed. For nervousness      . fenofibrate 160 MG tablet Take 160 mg by mouth daily.       . Fluticasone-Salmeterol (ADVAIR) 100-50 MCG/DOSE AEPB Inhale 1 puff into the lungs every 12 (twelve) hours.      . furosemide (LASIX) 40 MG tablet Take 40 mg by mouth 2 (two)  times daily.      Marland Kitchen gabapentin (NEURONTIN) 300 MG capsule Take 300 mg by mouth 3 (three) times daily.      Marland Kitchen ipratropium-albuterol (DUONEB) 0.5-2.5 (3) MG/3ML SOLN Take 3 mLs by nebulization every 6 (six) hours as needed. For shortness of breath or wheezing      . isosorbide mononitrate (IMDUR) 30 MG 24 hr tablet Take 30 mg by mouth daily.      . metoprolol tartrate (LOPRESSOR) 25 MG tablet Take 12.5 mg by mouth 2 (two) times daily.      . nitroGLYCERIN (NITROSTAT) 0.4 MG SL tablet Place 0.4 mg under the tongue every 5 (five) minutes as needed. For chest pain      . omeprazole (PRILOSEC) 20 MG capsule Take 20 mg by mouth.      . oxyCODONE-acetaminophen (PERCOCET) 10-325 MG per tablet Take 1 tablet by mouth every 6 (six) hours as needed. For pain      . promethazine (PHENERGAN) 25 MG tablet Take 12.5-25 mg by mouth every 6 (six) hours as needed. For nausea      .  tiotropium (SPIRIVA) 18 MCG inhalation capsule Place 18 mcg into inhaler and inhale daily.       . traMADol (ULTRAM) 50 MG tablet Take 50 mg by mouth 2 (two) times daily as needed. For pain      . Vitamin D, Ergocalciferol, (DRISDOL) 50000 UNITS CAPS Take 50,000 Units by mouth every 7 (seven) days. On Mondays      . warfarin (COUMADIN) 1 MG tablet Take 1 mg by mouth as directed. Give on Monday Wednesday and Friday.      . warfarin (COUMADIN) 3 MG tablet Take 3 mg by mouth daily. Take on Tuesday Thursday and Saturday and Sunday         Assessment: 48 YOF patient admitted with COPD exacerbation on 9/12. Coumadin PTA for h/o DVT- home dose is 3 mg daily except 1.5 mg MWF. Patient started on moxifloxacin for COPD exacerbation. INR yesterday was supratherapeutic at 3.57, and is even higher today at 3.72. No warfarin was given last evening. Last CBC was 9/13--stable. No bleeding noted.  Goal of Therapy:  INR 2-3 Monitor platelets by anticoagulation protocol: Yes   Plan:  - Hold warfarin again tonight - Daily PT/INR - CBC  tomorrow  Landon Truax D. Markis Langland, PharmD Clinical Pharmacist Pager: [phone] Phone: [phone] 01/03/2012 9:37 AM

## 2012-01-04 LAB — BASIC METABOLIC PANEL
BUN: 17 mg/dL (ref 6–23)
Calcium: 9 mg/dL (ref 8.4–10.5)
Creatinine, Ser: 0.85 mg/dL (ref 0.50–1.10)
GFR calc Af Amer: 80 mL/min — ABNORMAL LOW (ref >90)

## 2012-01-04 LAB — CBC
MCH: 29.2 pg (ref 26.0–34.0)
MCHC: 32.2 g/dL (ref 30.0–36.0)
MCV: 90.5 fL (ref 78.0–100.0)
Platelets: 295 10*3/uL (ref 150–400)
RDW: 15.5 % (ref 11.5–15.5)

## 2012-01-04 LAB — GLUCOSE, CAPILLARY: Glucose-Capillary: 82 mg/dL (ref 70–99)

## 2012-01-04 MED ORDER — POTASSIUM CHLORIDE CRYS ER 10 MEQ PO TBCR: 10.0000 meq | EXTENDED_RELEASE_TABLET | Freq: Two times a day (BID) | ORAL | Status: DC

## 2012-01-04 MED ORDER — BUPROPION HCL ER (XL) 150 MG PO TB24: 150.0000 mg | ORAL_TABLET | Freq: Every day | ORAL | Status: DC

## 2012-01-04 MED ORDER — PREDNISONE 20 MG PO TABS: 40.0000 mg | ORAL_TABLET | Freq: Every day | ORAL | Status: DC

## 2012-01-04 MED ORDER — FUROSEMIDE 20 MG PO TABS
20.0000 mg | ORAL_TABLET | Freq: Every day | ORAL | Status: DC
  Filled 2012-01-04: qty 1

## 2012-01-04 MED ORDER — PROMETHAZINE HCL 25 MG PO TABS: 12.5000 mg | ORAL_TABLET | Freq: Four times a day (QID) | ORAL | Status: DC | PRN

## 2012-01-04 MED ORDER — FUROSEMIDE 40 MG PO TABS: 40.0000 mg | ORAL_TABLET | Freq: Two times a day (BID) | ORAL | Status: DC

## 2012-01-04 MED ORDER — MOXIFLOXACIN HCL 400 MG PO TABS: 400.0000 mg | ORAL_TABLET | Freq: Every day | ORAL | Status: DC

## 2012-01-04 NOTE — Progress Notes (Signed)
Pt refused her 0200 treatment. Thank you

## 2012-01-04 NOTE — Care Management Note (Signed)
    Page 1 of 2   01/04/2012     2:26:45 PM   CARE MANAGEMENT NOTE 01/04/2012  Patient:  Aimee Sanders, Aimee Sanders   Account Number:  [id number]  Date Initiated:  01/04/2012  Documentation initiated by:  GRAVES-BIGELOW,Sianne Tejada  Subjective/Objective Assessment:   Pt admitted with COPD exacerbation. Pt has home 02 at home. Pt is from home with daughter and she states that she is currently active with [facility] for [facility] services.     Action/Plan:   CM did call [facility] to verify services. [facility] to call back. If so pt will need resumption order for services. Will f/u.   Anticipated DC Date:  01/05/2012   Anticipated DC Plan:  HOME W HOME HEALTH SERVICES      DC Planning Services  CM consult      [facility] Choice  Resumption Of Svcs/PTA Provider   Choice offered to / List presented to:  C-1 Patient        HH arranged  HH-1 RN  HH-10 DISEASE MANAGEMENT      HH agency  [facility]   Status of service:  Completed, signed off Medicare Important Message given?   (If response is "NO", the following Medicare IM given date fields will be blank) Date Medicare IM given:   Date Additional Medicare IM given:    Discharge Disposition:  HOME W HOME HEALTH SERVICES  Per UR Regulation:  Reviewed for med. necessity/level of care/duration of stay  If discussed at Long Length of Stay Meetings, dates discussed:    Comments:  01-04-12 1425 Tomi Bamberger, RN, BSN [phone] Pt is active with [facility] for services. MD to write resumption orders for service listed above. No further needs from CM at this time.

## 2012-01-04 NOTE — Progress Notes (Signed)
[facility]                                                                                   Aimee Sanders, is a 66 y.o. female  DOB 07/31/1945  MRN [id number].  Admission date:  12/31/2011  Discharge Date:  01/04/2012  Primary MD  Bennie Pierini, FNP  Admitting Physician  Maretta Bees, MD  Admission Diagnosis  Tobacco use disorder [305.1] Chronic diastolic heart failure [428.32] COPD with acute exacerbation [491.21] Respiratory failure, acute-on-chronic [518.84] COPD chronic obstructive pulmonary disease  Discharge Diagnosis     Principal Problem:  *Respiratory failure, acute-on-chronic Active Problems:  HYPERLIPIDEMIA  SMOKER  C V A / STROKE  DEEP VEIN THROMBOSIS/PHLEBITIS  Chronic diastolic heart failure  COPD with acute exacerbation      Past Medical History  Diagnosis Date  . Myocardial infarction     times 2  . CHF (congestive heart failure)   . Stroke   . Deep vein thrombosis     Phlebitis  . Hyperlipidemia   . Sleep apnea   . 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 Coronary artery disease   . Chronic pain     due to prior hip fracture and DDD back    Past Surgical History  Procedure Date  . Appendectomy   . Back surgery   . Cholecystectomy   . Coronary stent placement   . Breast surgery     benign tumors     Recommendations for primary care physician for things to follow:   Follow BMP in one week     Discharge Condition: stable   Diet recommendation: low salt diet   Consults none   History of present illness and  Hospital Course:    Patient is a 66 year old Asian female with a past medical history of COPD on home oxygen, DVT on chronic Coumadin therapy, coronary artery disease who presents to the hospital for evaluation of the above-noted complaints. For the past 2-3 days patient has been feeling much more worse than baseline in terms  of her breathing. She claims that the shortness of breath is particularly worse on exertion. This is associated with a productive cough with clear phlegm. She claims she's had a low-grade fever as well. She denies any chest pain. She claims that her right leg is chronically swollen and almost unchanged. She was seen by her primary care practitioner today and referred to the ED for evaluation of shortness of breath. During my evaluation, she claims that she feels much better than what she was originally when she came to the ED. She is now being admitted to the hospital for further evaluation and treatment.  Respiratory failure  Patient was admitted with acute respiratory failure, multifactorial in origin. Secondary to diastolic CHF and COPD exacerbation. At this time patient has improved with lasix and steroids. Patient is back to her baseline.   COPD exacerbation  Improved, back to her baseline.Patient was started on duoneb nebulizer  and prednisone taper. She was also started on avelox, which will be continued for five more days.  DVT  Patient is currently on coumadin with therapeutic INR. INR is mildly elevated, will recommend to hold coumadin for one day and restart from tomorrow  Diastolic dysfunction  Patient was started on IV lasix and had very good diuretic response. At this time she is at her baseline and will continue with po lasix 40 mg po q 12 hr    Chronic back pain  Will continue  percocet 10/325 mg prn at home dose.       Today   Subjective:   Aimee Sanders today has no headache,no chest abdominal pain.she is back to her baseline.  Objective:   Blood pressure 102/58, pulse 63, temperature 98.1 F (36.7 C), temperature source Oral, resp. rate 18, height 5\' 4"  (1.626 m), weight 60.147 kg (132 lb 9.6 oz), SpO2 97.00%.   Intake/Output Summary (Last 24 hours) at 01/04/12 1524 Last data filed at 01/04/12 1300  Gross per 24 hour  Intake    240 ml  Output   2201 ml  Net   -1961 ml    Exam Awake Alert, Oriented *3, No new F.N deficits, Normal affect Walker.AT,PERRAL Supple Neck,No JVD, No cervical lymphadenopathy appriciated.  Symmetrical Chest wall movement, Good air movement bilaterally, CTAB RRR,No Gallops,Rubs or new Murmurs, No Parasternal Heave +ve B.Sounds, Abd Soft, Non tender, No organomegaly appriciated, No rebound -guarding or rigidity. No Cyanosis, Clubbing or edema, No new Rash or bruise  Data Review   Major procedures and Radiology Reports - PLEASE review detailed and final reports for all details in brief -      Dg Chest 2 View  12/31/2011  *RADIOLOGY REPORT*  Clinical Data: Shortness of breath.  Smoker, hypertension.  CHEST - 2 VIEW  Comparison: 12/07/2011  Findings: Mild cardiomegaly. There is hyperinflation of the lungs compatible with COPD.  No confluent opacities or effusions. Multiple compression fractures in the thoracic and upper lumbar spine with three level vertebral augmentation.  Stable appearance of the compression fractures.  IMPRESSION: Cardiomegaly, COPD.  No active disease.   Original Report Authenticated By: Cyndie Chime, M.D.     Micro Results     No results found for this or any previous visit (from the past 240 hour(s)).   CBC w Diff: Lab Results  Component Value Date   WBC 8.2 01/04/2012   HGB 10.7* 01/04/2012   HCT 33.2* 01/04/2012   PLT 295 01/04/2012   LYMPHOPCT 21 12/31/2011   MONOPCT 13* 12/31/2011   EOSPCT 2 12/31/2011   BASOPCT 1 12/31/2011    CMP: Lab Results  Component Value Date   NA 138 01/04/2012   K 4.3 01/04/2012   CL 98 01/04/2012   CO2 36* 01/04/2012   BUN 17 01/04/2012   CREATININE 0.85 01/04/2012   PROT 5.6* 01/01/2012   ALBUMIN 2.5* 01/01/2012   BILITOT 0.3 01/01/2012   ALKPHOS 68 01/01/2012   AST 34 01/01/2012   ALT 18 01/01/2012  .   Discharge Instructions     Hold the coumadin for one day..and restart from tomorrow...      Discharge Medications     Medication List     As of  01/04/2012  3:24 PM    START taking these medications         moxifloxacin 400 MG tablet   Commonly known as: AVELOX   Take 1 tablet (400 mg total) by mouth daily at 8 pm.      predniSONE 20 MG tablet   Commonly known as: DELTASONE  Take 2 tablets (40 mg total) by mouth daily with breakfast.      CHANGE how you take these medications         promethazine 25 MG tablet   Commonly known as: PHENERGAN   Take 0.5-1 tablets (12.5-25 mg total) by mouth every 6 (six) hours as needed for nausea. For nausea   What changed: reasons to take the med      CONTINUE taking these medications         atorvastatin 40 MG tablet   Commonly known as: LIPITOR      citalopram 20 MG tablet   Commonly known as: CELEXA      clonazePAM 0.5 MG tablet   Commonly known as: KLONOPIN      fenofibrate 160 MG tablet      Fluticasone-Salmeterol 100-50 MCG/DOSE Aepb   Commonly known as: ADVAIR      furosemide 40 MG tablet   Commonly known as: LASIX   Take 1 tablet (40 mg total) by mouth 2 (two) times daily.      gabapentin 300 MG capsule   Commonly known as: NEURONTIN      ipratropium-albuterol 0.5-2.5 (3) MG/3ML Soln   Commonly known as: DUONEB      isosorbide mononitrate 30 MG 24 hr tablet   Commonly known as: IMDUR      metoprolol tartrate 25 MG tablet   Commonly known as: LOPRESSOR      nitroGLYCERIN 0.4 MG SL tablet   Commonly known as: NITROSTAT      omeprazole 20 MG capsule   Commonly known as: PRILOSEC      oxyCODONE-acetaminophen 10-325 MG per tablet   Commonly known as: PERCOCET      potassium chloride 10 MEQ tablet   Commonly known as: K-DUR,KLOR-CON   Take 1 tablet (10 mEq total) by mouth 2 (two) times daily.      tiotropium 18 MCG inhalation capsule   Commonly known as: SPIRIVA      traMADol 50 MG tablet   Commonly known as: ULTRAM      Vitamin D (Ergocalciferol) 50000 UNITS Caps   Commonly known as: DRISDOL      * warfarin 1 MG tablet   Commonly known as: COUMADIN       * warfarin 3 MG tablet   Commonly known as: COUMADIN     * Notice: This list has 2 medication(s) that are the same as other medications prescribed for you. Read the directions carefully, and ask your doctor or other care provider to review them with you.        Where to get your medications    These are the prescriptions that you need to pick up.   You may get these medications from any pharmacy.         furosemide 40 MG tablet   moxifloxacin 400 MG tablet   potassium chloride 10 MEQ tablet   predniSONE 20 MG tablet   promethazine 25 MG tablet               Total Time in preparing paper work, data evaluation and todays exam - 35 minutes  Estle Huguley S M.D on 01/04/2012 at 3:24 PM  [facility] Office  [phone]

## 2012-01-04 NOTE — Progress Notes (Signed)
ANTICOAGULATION CONSULT NOTE - Follow up Consult  Pharmacy Consult for Coumadin Indication: h/o DVT  Allergies  Allergen Reactions  . Aspirin Other (See Comments)    Son does not remember reaction  . Penicillins Hives and Itching    Vital Signs: Temp: 98 F (36.7 C) (09/16 0500) BP: 106/65 mmHg (09/16 0500) Pulse Rate: 57  (09/16 0500)  Labs:  Basename 01/04/12 0450 01/03/12 0545 01/02/12 0530 01/01/12 0935  HGB 10.7* -- -- --  HCT 33.2* -- -- --  PLT 295 -- -- --  APTT -- -- -- --  LABPROT 38.7* 37.4* 36.2* --  INR 3.89* 3.72* 3.57* --  HEPARINUNFRC -- -- -- --  CREATININE 0.85 0.76 0.66 --  CKTOTAL -- -- -- --  CKMB -- -- -- --  TROPONINI -- -- -- <0.30    Estimated Creatinine Clearance: 55.5 ml/min (by C-G formula based on Cr of 0.85).   Medical History: Past Medical History  Diagnosis Date  . Myocardial infarction     times 2  . CHF (congestive heart failure)   . Stroke   . Deep vein thrombosis     Phlebitis  . Hyperlipidemia   . Sleep apnea   . 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 Coronary artery disease   . Chronic pain     due to prior hip fracture and DDD back    Medications:  Prescriptions prior to admission  Medication Sig Dispense Refill  . atorvastatin (LIPITOR) 40 MG tablet Take 20 mg by mouth daily.       . citalopram (CELEXA) 20 MG tablet Take 20 mg by mouth daily.       . clonazePAM (KLONOPIN) 0.5 MG tablet Take 1 mg by mouth 3 (three) times daily as needed. For nervousness      . fenofibrate 160 MG tablet Take 160 mg by mouth daily.       . Fluticasone-Salmeterol (ADVAIR) 100-50 MCG/DOSE AEPB Inhale 1 puff into the lungs every 12 (twelve) hours.      . furosemide (LASIX) 40 MG tablet Take 40 mg by mouth 2 (two) times daily.      Marland Kitchen gabapentin (NEURONTIN) 300 MG capsule Take 300 mg by mouth 3 (three) times daily.      Marland Kitchen ipratropium-albuterol (DUONEB) 0.5-2.5 (3)  MG/3ML SOLN Take 3 mLs by nebulization every 6 (six) hours as needed. For shortness of breath or wheezing      . isosorbide mononitrate (IMDUR) 30 MG 24 hr tablet Take 30 mg by mouth daily.      . metoprolol tartrate (LOPRESSOR) 25 MG tablet Take 12.5 mg by mouth 2 (two) times daily.      . nitroGLYCERIN (NITROSTAT) 0.4 MG SL tablet Place 0.4 mg under the tongue every 5 (five) minutes as needed. For chest pain      . omeprazole (PRILOSEC) 20 MG capsule Take 20 mg by mouth.      . oxyCODONE-acetaminophen (PERCOCET) 10-325 MG per tablet Take 1 tablet by mouth every 6 (six) hours as needed. For pain      . promethazine (PHENERGAN) 25 MG tablet Take 12.5-25 mg by mouth every 6 (six) hours as needed. For nausea      . tiotropium (SPIRIVA) 18 MCG inhalation capsule Place 18 mcg into inhaler and inhale daily.       . traMADol (ULTRAM) 50 MG tablet Take 50 mg by mouth 2 (two)  times daily as needed. For pain      . Vitamin D, Ergocalciferol, (DRISDOL) 50000 UNITS CAPS Take 50,000 Units by mouth every 7 (seven) days. On Mondays      . warfarin (COUMADIN) 1 MG tablet Take 1 mg by mouth as directed. Give on Monday Wednesday and Friday.      . warfarin (COUMADIN) 3 MG tablet Take 3 mg by mouth daily. Take on Tuesday Thursday and Saturday and Sunday         Assessment: 66 yo female with CC SOB with DOE= COPD exac.  Anticoagulation - hx DVT on chronic Coumadin therapy. Home dose of Coumadin is 3 mg daily except 1.5 mg MWF. Recent INR may be elevated due to start of Avelox for COPD. INR 3.89 this am continues to climb.  Infectious Disease - avelox Day #5 for COPD exacerbation (no pna), Afebrile. WBC 8.2.  Cardiovascular - hx CAD w/MI, CHF, HLD - tropnonis neg x4 - lipitor, fenofibrate, lasix , imdur, lopressor, K. 106/65,57  Endocrinology: CBGs 82-177 (on methylpred)  Gastrointestinal / Nutrition: PPI  Neurology: chronic pain (hip fx and DDD back) -wellbutrin, citalopram  Nephrology - Scr 0.85, K 4.3  (standing 40 meq BID)  Pulmonary - COPD on home oxygen, sleep apnea - COPD exacerbation - solumedrol, nebs, avelox  Hematology / Oncology - CBC stable.  PTA Medication Issues - advair, tiotropium, gabapentin not resumed  Best Practices - Coumadin, po PPI, pneumo and flu vac  Goal of Therapy:  INR 2-3 Monitor platelets by anticoagulation protocol: Yes  Plan:  -Hold warfarin again tonight -Daily PT/INR   Codi Folkerts S. Merilynn Finland, PharmD, BCPS Clinical Staff Pharmacist Pager [phone]  -01/04/2012 8:41 AM

## 2012-01-04 NOTE — Progress Notes (Signed)
UR Completed Ramsey Midgett Graves-Bigelow, RN,BSN [phone]  

## 2012-01-04 NOTE — Evaluation (Signed)
Physical Therapy Evaluation and Discharge Patient Details Name: Aimee Sanders MRN: [id number] DOB: 1945-10-02 Today's Date: 01/04/2012 Time: 4098-1191 PT Time Calculation (min): 19 min  PT Assessment / Plan / Recommendation Clinical Impression  Patient is a 66 year old Asian female with a past medical history of COPD on home oxygen, DVT on chronic Coumadin therapy, coronary artery disease who presents to the hospital for evaluation of the above-noted complaints. For the past 2-3 days patient has been feeling much more worse than baseline in terms of her breathing. She claims that the shortness of breath is particularly worse on exertion. This is associated with a productive cough with clear phlegm. She claims she's had a low-grade fever as well. She denies any chest pain.  Pt presents with generalized weakness and limted activity tolerance.  Pt appears to be functioning near her baseline.  Suggesting Cardiac rehab follow-up and daily ambulation with Nursing. No further acute PT or follow-up PT needs.      PT Assessment  Patent does not need any further PT services    Follow Up Recommendations  No PT follow up    Barriers to Discharge        Equipment Recommendations  None recommended by PT    Recommendations for Other Services     Frequency      Precautions / Restrictions Precautions Precautions: None Restrictions Weight Bearing Restrictions: No    Pertinent Vitals/Pain No c/o pain. SpO2 on 4L of O2 = 90% at rest prior to activity, 88-90% while ambulating, and 94% at rest after ambulating.        Mobility  Bed Mobility Bed Mobility: Supine to Sit Supine to Sit: 7: Independent;HOB flat Details for Bed Mobility Assistance: No assistance required.  Transfers Transfers: Sit to Stand;Stand to Sit Sit to Stand: 5: Supervision;From bed;With upper extremity assist Stand to Sit: 5: Supervision;To chair/3-in-1;With upper extremity assist Details for Transfer Assistance: Cues for  hand placement Ambulation/Gait Ambulation/Gait Assistance: 5: Supervision Ambulation Distance (Feet): 180 Feet Assistive device: Rolling walker Ambulation/Gait Assistance Details: Supervision for safety.  Pt SpO2 sats ranging from 88-94% on 4 liters of O2 via [place].  Pt a little unsteady with RW.   Gait Pattern: Step-through pattern;Decreased stride length Stairs: No Wheelchair Mobility Wheelchair Mobility: No    Exercises Total Joint Exercises Ankle Circles/Pumps: Both;10 reps;Strengthening;Seated   PT Diagnosis:    PT Problem List:   PT Treatment Interventions:     PT Goals Acute Rehab PT Goals PT Goal Formulation: With patient  Visit Information  Last PT Received On: 01/04/12 Assistance Needed: +1    Subjective Data  Subjective: agree to PT eval Patient Stated Goal: none stated   Prior Functioning  Home Living Lives With: Family;Daughter;Son Available Help at Discharge: Family;Available 24 hours/day Type of Home: Mobile home Home Access: Stairs to enter Entrance Stairs-Number of Steps: 5 Entrance Stairs-Rails: Can reach both Home Layout: One level Bathroom Shower/Tub: Forensic scientist: Standard Bathroom Accessibility: Yes How Accessible: Accessible via walker Home Adaptive Equipment: Walker - rolling;Straight cane Prior Function Level of Independence: Independent with assistive device(s) Able to Take Stairs?: Yes Driving: No Vocation: Retired Musician: No difficulties Dominant Hand: Right    Cognition  Overall Cognitive Status: Appears within functional limits for tasks assessed/performed Arousal/Alertness: Awake/alert Orientation Level: Appears intact for tasks assessed Behavior During Session: [facility] for tasks performed    Extremity/Trunk Assessment Right Upper Extremity Assessment RUE ROM/Strength/Tone: Within functional levels Left Upper Extremity Assessment LUE ROM/Strength/Tone: Within functional levels Right  Lower Extremity Assessment RLE ROM/Strength/Tone: Within functional levels Left Lower Extremity Assessment LLE ROM/Strength/Tone: Within functional levels   Balance Balance Balance Assessed: No  End of Session PT - End of Session Equipment Utilized During Treatment: Gait belt Activity Tolerance: Patient tolerated treatment well Patient left: in chair;with call bell/phone within reach Nurse Communication: Mobility status  GP     Aimee Sanders 01/04/2012, 12:54 PM  Aimee Sanders L. Kima Malenfant DPT [phone]

## 2012-01-12 NOTE — Discharge Summary (Signed)
[facility]                                                                                   Aimee Sanders, is a 66 y.o. female  DOB 06/01/1945  MRN [id number].  Admission date:  12/31/2011  Discharge Date:  01/04/2012  Primary MD  MARTIN,MARY MARGARET, FNP  Admitting Physician  Shanker M Ghimire, MD  Admission Diagnosis  Tobacco use disorder [305.1] Chronic diastolic heart failure [428.32] COPD with acute exacerbation [491.21] Respiratory failure, acute-on-chronic [518.84] COPD chronic obstructive pulmonary disease  Discharge Diagnosis     Principal Problem:  *Respiratory failure, acute-on-chronic Active Problems:  HYPERLIPIDEMIA  SMOKER  C V A / STROKE  DEEP VEIN THROMBOSIS/PHLEBITIS  Chronic diastolic heart failure  COPD with acute exacerbation      Past Medical History  Diagnosis Date  . Myocardial infarction     times 2  . CHF (congestive heart failure)   . Stroke   . Deep vein thrombosis     Phlebitis  . Hyperlipidemia   . Sleep apnea   . 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 Coronary artery disease   . Chronic pain     due to prior hip fracture and DDD back    Past Surgical History  Procedure Date  . Appendectomy   . Back surgery   . Cholecystectomy   . Coronary stent placement   . 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 For the past 2-3 days patient has been feeling much more worse than baseline in terms  of her breathing. She claims that the shortness of breath is particularly worse on exertion. This is associated with a productive cough with clear phlegm. She claims she's had a low-grade fever as well. She denies any chest pain. She claims that her right leg is chronically swollen and almost unchanged. She was seen by her primary care practitioner today and referred to the ED for evaluation of shortness of breath. During my evaluation, she claims that she feels much better than what she was originally when she came to the ED. She is now being admitted to the hospital for further evaluation and treatment.  Respiratory failure  Patient was admitted with acute respiratory failure, multifactorial in origin. Secondary to diastolic CHF and COPD exacerbation. At this time patient has improved with lasix and steroids. Patient is back to her baseline.   COPD exacerbation  Improved, back to her baseline.Patient was started on duoneb nebulizer  and prednisone taper. She was also started on avelox, which will be continued for five more days.    DVT  Patient is currently on coumadin with therapeutic INR. INR is mildly elevated, will recommend to hold coumadin for one day and restart from tomorrow  Diastolic dysfunction  Patient was started on IV lasix and had very good diuretic response. At this time she is at her baseline and will continue with po lasix 40 mg po q 12 hr    Chronic back pain  Will continue  percocet 10/325 mg prn at home dose.       Today   Subjective:   Aimee Sanders today has no headache,no chest abdominal pain.she is back to her baseline.  Objective:   Blood pressure 102/58, pulse 63, temperature 98.1 F (36.7 C), temperature source Oral, resp. rate 18, height 5' 4" (1.626 m), weight 60.147 kg (132 lb 9.6 oz), SpO2 97.00%.   Intake/Output Summary (Last 24 hours) at 01/04/12 1524 Last data filed at 01/04/12 1300  Gross per 24 hour  Intake    240 ml  Output   2201 ml  Net   -1961 ml    Exam Awake Alert, Oriented *3, No new F.N deficits, Normal affect [place].AT,PERRAL Supple Neck,No JVD, No cervical lymphadenopathy appriciated.  Symmetrical Chest wall movement, Good air movement bilaterally, CTAB RRR,No Gallops,Rubs or new Murmurs, No Parasternal Heave +ve B.Sounds, Abd Soft, Non tender, No organomegaly appriciated, No rebound -guarding or rigidity. No Cyanosis, Clubbing or edema, No new Rash or bruise  Data Review   Major procedures and Radiology Reports - PLEASE review detailed and final reports for all details in brief -      Dg Chest 2 View  12/31/2011  *RADIOLOGY REPORT*  Clinical Data: Shortness of breath.  Smoker, hypertension.  CHEST - 2 VIEW  Comparison: 12/07/2011  Findings: Mild cardiomegaly. There is hyperinflation of the lungs compatible with COPD.  No confluent opacities or effusions. Multiple compression fractures in the thoracic and upper lumbar spine with three level vertebral augmentation.  Stable appearance of the compression fractures.  IMPRESSION: Cardiomegaly, COPD.  No active disease.   Original Report Authenticated By: KEVIN G. DOVER, M.D.     Micro Results     No results found for this or any previous visit (from the past 240 hour(s)).   CBC w Diff: Lab Results  Component Value Date   WBC 8.2 01/04/2012   HGB 10.7* 01/04/2012   HCT 33.2* 01/04/2012   PLT 295 01/04/2012   LYMPHOPCT 21 12/31/2011   MONOPCT 13* 12/31/2011   EOSPCT 2 12/31/2011   BASOPCT 1 12/31/2011    CMP: Lab Results  Component Value Date   NA 138 01/04/2012   K 4.3 01/04/2012   CL 98 01/04/2012   CO2 36* 01/04/2012   BUN 17 01/04/2012   CREATININE 0.85 01/04/2012   PROT 5.6* 01/01/2012   ALBUMIN 2.5* 01/01/2012   BILITOT 0.3 01/01/2012   ALKPHOS 68 01/01/2012   AST 34 01/01/2012   ALT 18 01/01/2012  .   Discharge Instructions     Hold the coumadin for one day..and restart from tomorrow...      Discharge Medications     Medication List     As of  01/04/2012  3:24 PM    START taking these medications         moxifloxacin 400 MG tablet   Commonly known as: AVELOX   Take 1 tablet (400 mg total) by mouth daily at 8 pm.      predniSONE 20 MG tablet   Commonly known as: DELTASONE     Take 2 tablets (40 mg total) by mouth daily with breakfast.      CHANGE how you take these medications         promethazine 25 MG tablet   Commonly known as: PHENERGAN   Take 0.5-1 tablets (12.5-25 mg total) by mouth every 6 (six) hours as needed for nausea. For nausea   What changed: reasons to take the med      CONTINUE taking these medications         atorvastatin 40 MG tablet   Commonly known as: LIPITOR      citalopram 20 MG tablet   Commonly known as: CELEXA      clonazePAM 0.5 MG tablet   Commonly known as: KLONOPIN      fenofibrate 160 MG tablet      Fluticasone-Salmeterol 100-50 MCG/DOSE Aepb   Commonly known as: ADVAIR      furosemide 40 MG tablet   Commonly known as: LASIX   Take 1 tablet (40 mg total) by mouth 2 (two) times daily.      gabapentin 300 MG capsule   Commonly known as: NEURONTIN      ipratropium-albuterol 0.5-2.5 (3) MG/3ML Soln   Commonly known as: DUONEB      isosorbide mononitrate 30 MG 24 hr tablet   Commonly known as: IMDUR      metoprolol tartrate 25 MG tablet   Commonly known as: LOPRESSOR      nitroGLYCERIN 0.4 MG SL tablet   Commonly known as: NITROSTAT      omeprazole 20 MG capsule   Commonly known as: PRILOSEC      oxyCODONE-acetaminophen 10-325 MG per tablet   Commonly known as: PERCOCET      potassium chloride 10 MEQ tablet   Commonly known as: K-DUR,KLOR-CON   Take 1 tablet (10 mEq total) by mouth 2 (two) times daily.      tiotropium 18 MCG inhalation capsule   Commonly known as: SPIRIVA      traMADol 50 MG tablet   Commonly known as: ULTRAM      Vitamin D (Ergocalciferol) 50000 UNITS Caps   Commonly known as: DRISDOL      * warfarin 1 MG tablet   Commonly known as: COUMADIN       * warfarin 3 MG tablet   Commonly known as: COUMADIN     * Notice: This list has 2 medication(s) that are the same as other medications prescribed for you. Read the directions carefully, and ask your doctor or other care provider to review them with you.        Where to get your medications    These are the prescriptions that you need to pick up.   You may get these medications from any pharmacy.         furosemide 40 MG tablet   moxifloxacin 400 MG tablet   potassium chloride 10 MEQ tablet   predniSONE 20 MG tablet   promethazine 25 MG tablet               Total Time in preparing paper work, data evaluation and todays exam - 35 minutes  Rosali Augello S M.D on 01/04/2012 at 3:24 PM  [facility] Office  [phone]    

## 2012-01-25 ENCOUNTER — Telehealth: Payer: Self-pay | Admitting: Cardiology

## 2012-01-25 NOTE — Telephone Encounter (Signed)
Called pharmacy and requested that refill request be sent to primary care doctor because patient has not been seen in a year.

## 2012-02-08 ENCOUNTER — Encounter (HOSPITAL_COMMUNITY): Payer: Self-pay | Admitting: Internal Medicine

## 2012-02-08 ENCOUNTER — Emergency Department (HOSPITAL_COMMUNITY): Payer: PRIVATE HEALTH INSURANCE

## 2012-02-08 ENCOUNTER — Inpatient Hospital Stay (HOSPITAL_COMMUNITY)
Admission: EM | Admit: 2012-02-08 | Discharge: 2012-02-11 | DRG: 291 | Disposition: A | Discharge status: Home Health | Payer: PRIVATE HEALTH INSURANCE | Attending: Internal Medicine | Admitting: Internal Medicine

## 2012-02-08 DIAGNOSIS — G4733 Obstructive sleep apnea (adult) (pediatric): Secondary | ICD-10-CM | POA: Diagnosis present

## 2012-02-08 DIAGNOSIS — J4489 Other specified chronic obstructive pulmonary disease: Secondary | ICD-10-CM

## 2012-02-08 DIAGNOSIS — F17200 Nicotine dependence, unspecified, uncomplicated: Secondary | ICD-10-CM

## 2012-02-08 DIAGNOSIS — Z79899 Other long term (current) drug therapy: Secondary | ICD-10-CM

## 2012-02-08 DIAGNOSIS — I219 Acute myocardial infarction, unspecified: Secondary | ICD-10-CM

## 2012-02-08 DIAGNOSIS — Z7901 Long term (current) use of anticoagulants: Secondary | ICD-10-CM

## 2012-02-08 DIAGNOSIS — I6789 Other cerebrovascular disease: Secondary | ICD-10-CM

## 2012-02-08 DIAGNOSIS — I3139 Other pericardial effusion (noninflammatory): Secondary | ICD-10-CM

## 2012-02-08 DIAGNOSIS — G4730 Sleep apnea, unspecified: Secondary | ICD-10-CM

## 2012-02-08 DIAGNOSIS — I959 Hypotension, unspecified: Secondary | ICD-10-CM | POA: Diagnosis present

## 2012-02-08 DIAGNOSIS — Z86718 Personal history of other venous thrombosis and embolism: Secondary | ICD-10-CM

## 2012-02-08 DIAGNOSIS — Z9981 Dependence on supplemental oxygen: Secondary | ICD-10-CM

## 2012-02-08 DIAGNOSIS — J449 Chronic obstructive pulmonary disease, unspecified: Secondary | ICD-10-CM

## 2012-02-08 DIAGNOSIS — J441 Chronic obstructive pulmonary disease with (acute) exacerbation: Secondary | ICD-10-CM

## 2012-02-08 DIAGNOSIS — R911 Solitary pulmonary nodule: Secondary | ICD-10-CM

## 2012-02-08 DIAGNOSIS — Z9861 Coronary angioplasty status: Secondary | ICD-10-CM

## 2012-02-08 DIAGNOSIS — I509 Heart failure, unspecified: Secondary | ICD-10-CM

## 2012-02-08 DIAGNOSIS — I5032 Chronic diastolic (congestive) heart failure: Secondary | ICD-10-CM

## 2012-02-08 DIAGNOSIS — I739 Peripheral vascular disease, unspecified: Secondary | ICD-10-CM

## 2012-02-08 DIAGNOSIS — I252 Old myocardial infarction: Secondary | ICD-10-CM

## 2012-02-08 DIAGNOSIS — E785 Hyperlipidemia, unspecified: Secondary | ICD-10-CM

## 2012-02-08 DIAGNOSIS — R6251 Failure to thrive (child): Secondary | ICD-10-CM

## 2012-02-08 DIAGNOSIS — J9620 Acute and chronic respiratory failure, unspecified whether with hypoxia or hypercapnia: Secondary | ICD-10-CM

## 2012-02-08 DIAGNOSIS — D649 Anemia, unspecified: Secondary | ICD-10-CM

## 2012-02-08 DIAGNOSIS — G8929 Other chronic pain: Secondary | ICD-10-CM | POA: Diagnosis present

## 2012-02-08 DIAGNOSIS — D689 Coagulation defect, unspecified: Secondary | ICD-10-CM

## 2012-02-08 DIAGNOSIS — Z8673 Personal history of transient ischemic attack (TIA), and cerebral infarction without residual deficits: Secondary | ICD-10-CM

## 2012-02-08 DIAGNOSIS — I5023 Acute on chronic systolic (congestive) heart failure: Principal | ICD-10-CM

## 2012-02-08 DIAGNOSIS — I313 Pericardial effusion (noninflammatory): Secondary | ICD-10-CM

## 2012-02-08 DIAGNOSIS — I2510 Atherosclerotic heart disease of native coronary artery without angina pectoris: Secondary | ICD-10-CM | POA: Diagnosis present

## 2012-02-08 DIAGNOSIS — I80299 Phlebitis and thrombophlebitis of other deep vessels of unspecified lower extremity: Secondary | ICD-10-CM

## 2012-02-08 DIAGNOSIS — J9610 Chronic respiratory failure, unspecified whether with hypoxia or hypercapnia: Secondary | ICD-10-CM

## 2012-02-08 LAB — CBC WITH DIFFERENTIAL/PLATELET
Basophils Absolute: 0 10*3/uL (ref 0.0–0.1)
Basophils Relative: 1 % (ref 0–1)
Eosinophils Absolute: 0.1 10*3/uL (ref 0.0–0.7)
HCT: 32.9 % — ABNORMAL LOW (ref 36.0–46.0)
Hemoglobin: 10.6 g/dL — ABNORMAL LOW (ref 12.0–15.0)
MCH: 29.7 pg (ref 26.0–34.0)
MCHC: 32.2 g/dL (ref 30.0–36.0)
Monocytes Absolute: 0.7 10*3/uL (ref 0.1–1.0)
Monocytes Relative: 8 % (ref 3–12)
Neutro Abs: 4.5 10*3/uL (ref 1.7–7.7)
Neutrophils Relative %: 54 % (ref 43–77)
RDW: 15.9 % — ABNORMAL HIGH (ref 11.5–15.5)

## 2012-02-08 LAB — TROPONIN I: Troponin I: <0.3 ng/mL (ref <0.30)

## 2012-02-08 LAB — COMPREHENSIVE METABOLIC PANEL
AST: 25 U/L (ref 0–37)
Albumin: 2.6 g/dL — ABNORMAL LOW (ref 3.5–5.2)
BUN: 7 mg/dL (ref 6–23)
Calcium: 9.1 mg/dL (ref 8.4–10.5)
Chloride: 101 mEq/L (ref 96–112)
Creatinine, Ser: 0.59 mg/dL (ref 0.50–1.10)
Total Protein: 5.9 g/dL — ABNORMAL LOW (ref 6.0–8.3)

## 2012-02-08 LAB — POCT I-STAT 3, ART BLOOD GAS (G3+)
O2 Saturation: 93 %
TCO2: 34 mmol/L (ref 0–100)
pCO2 arterial: 52 mmHg — ABNORMAL HIGH (ref 35.0–45.0)
pH, Arterial: 7.399 (ref 7.350–7.450)
pO2, Arterial: 70 mmHg — ABNORMAL LOW (ref 80.0–100.0)

## 2012-02-08 LAB — PROTIME-INR
INR: 1.08 (ref 0.00–1.49)
Prothrombin Time: 13.9 seconds (ref 11.6–15.2)

## 2012-02-08 LAB — PRO B NATRIURETIC PEPTIDE: Pro B Natriuretic peptide (BNP): 1958 pg/mL — ABNORMAL HIGH (ref 0–125)

## 2012-02-08 MED ORDER — FUROSEMIDE 10 MG/ML IJ SOLN
20.0000 mg | Freq: Once | INTRAMUSCULAR | Status: AC
  Administered 2012-02-08: 20 mg via INTRAVENOUS
  Filled 2012-02-08: qty 2

## 2012-02-08 MED ORDER — RIVAROXABAN 20 MG PO TABS
20.0000 mg | ORAL_TABLET | Freq: Every day | ORAL | Status: DC
Start: 2012-02-08 — End: 2012-02-11
  Administered 2012-02-09 – 2012-02-10 (×2): 20 mg via ORAL
  Filled 2012-02-08 (×4): qty 1

## 2012-02-08 MED ORDER — ALBUTEROL SULFATE (5 MG/ML) 0.5% IN NEBU
INHALATION_SOLUTION | RESPIRATORY_TRACT | Status: AC
  Administered 2012-02-08: 5 mg via RESPIRATORY_TRACT
  Filled 2012-02-08: qty 1

## 2012-02-08 MED ORDER — METOPROLOL TARTRATE 25 MG PO TABS
25.0000 mg | ORAL_TABLET | Freq: Two times a day (BID) | ORAL | Status: DC
  Filled 2012-02-08 (×3): qty 1

## 2012-02-08 MED ORDER — MOXIFLOXACIN HCL IN NACL 400 MG/250ML IV SOLN
400.0000 mg | Freq: Once | INTRAVENOUS | Status: AC
  Administered 2012-02-08: 400 mg via INTRAVENOUS
  Filled 2012-02-08: qty 250

## 2012-02-08 MED ORDER — ALBUTEROL SULFATE (5 MG/ML) 0.5% IN NEBU: 2.5000 mg | INHALATION_SOLUTION | RESPIRATORY_TRACT | Status: DC | PRN

## 2012-02-08 MED ORDER — FUROSEMIDE 10 MG/ML IJ SOLN
20.0000 mg | Freq: Once | INTRAMUSCULAR | Status: DC
  Filled 2012-02-08: qty 2

## 2012-02-08 MED ORDER — ONDANSETRON HCL 4 MG PO TABS: 4.0000 mg | ORAL_TABLET | Freq: Four times a day (QID) | ORAL | Status: DC | PRN

## 2012-02-08 MED ORDER — ACETAMINOPHEN 325 MG PO TABS
650.0000 mg | ORAL_TABLET | Freq: Four times a day (QID) | ORAL | Status: DC | PRN
  Administered 2012-02-10: 650 mg via ORAL
  Filled 2012-02-08: qty 2

## 2012-02-08 MED ORDER — METHYLPREDNISOLONE SODIUM SUCC 125 MG IJ SOLR
125.0000 mg | Freq: Once | INTRAMUSCULAR | Status: AC
  Administered 2012-02-08: 125 mg via INTRAVENOUS
  Filled 2012-02-08: qty 2

## 2012-02-08 MED ORDER — ALBUTEROL SULFATE (5 MG/ML) 0.5% IN NEBU
5.0000 mg | INHALATION_SOLUTION | Freq: Once | RESPIRATORY_TRACT | Status: AC
  Administered 2012-02-08: 5 mg via RESPIRATORY_TRACT

## 2012-02-08 MED ORDER — MONTELUKAST SODIUM 10 MG PO TABS
10.0000 mg | ORAL_TABLET | Freq: Every day | ORAL | Status: DC
  Administered 2012-02-08 – 2012-02-10 (×3): 10 mg via ORAL
  Filled 2012-02-08 (×4): qty 1

## 2012-02-08 MED ORDER — MOXIFLOXACIN HCL IN NACL 400 MG/250ML IV SOLN
400.0000 mg | INTRAVENOUS | Status: DC
  Filled 2012-02-08: qty 250

## 2012-02-08 MED ORDER — CLONAZEPAM 0.5 MG PO TABS
1.0000 mg | ORAL_TABLET | Freq: Three times a day (TID) | ORAL | Status: DC | PRN
  Administered 2012-02-08 – 2012-02-11 (×7): 1 mg via ORAL
  Filled 2012-02-08 (×7): qty 2
  Filled 2012-02-08: qty 1
  Filled 2012-02-08: qty 2

## 2012-02-08 MED ORDER — ALBUTEROL SULFATE (5 MG/ML) 0.5% IN NEBU: 5.0500 mg | INHALATION_SOLUTION | Freq: Once | RESPIRATORY_TRACT | Status: DC

## 2012-02-08 MED ORDER — METHYLPREDNISOLONE SODIUM SUCC 125 MG IJ SOLR
60.0000 mg | Freq: Three times a day (TID) | INTRAMUSCULAR | Status: AC
  Administered 2012-02-08 – 2012-02-09 (×3): 60 mg via INTRAVENOUS
  Filled 2012-02-08 (×5): qty 0.96

## 2012-02-08 MED ORDER — FUROSEMIDE 10 MG/ML IJ SOLN
60.0000 mg | Freq: Two times a day (BID) | INTRAMUSCULAR | Status: DC
  Administered 2012-02-08 – 2012-02-09 (×2): 60 mg via INTRAVENOUS
  Filled 2012-02-08 (×2): qty 6

## 2012-02-08 MED ORDER — ALBUTEROL (5 MG/ML) CONTINUOUS INHALATION SOLN
INHALATION_SOLUTION | RESPIRATORY_TRACT | Status: AC
  Administered 2012-02-08: 5 mg via RESPIRATORY_TRACT
  Filled 2012-02-08: qty 40

## 2012-02-08 MED ORDER — TRAMADOL HCL 50 MG PO TABS
50.0000 mg | ORAL_TABLET | Freq: Two times a day (BID) | ORAL | Status: DC | PRN
  Administered 2012-02-08 – 2012-02-09 (×3): 50 mg via ORAL
  Filled 2012-02-08 (×4): qty 1

## 2012-02-08 MED ORDER — IPRATROPIUM BROMIDE 0.02 % IN SOLN: 0.5000 mg | Freq: Once | RESPIRATORY_TRACT | Status: DC

## 2012-02-08 MED ORDER — CITALOPRAM HYDROBROMIDE 20 MG PO TABS
20.0000 mg | ORAL_TABLET | Freq: Every day | ORAL | Status: DC
  Administered 2012-02-09 – 2012-02-11 (×3): 20 mg via ORAL
  Filled 2012-02-08 (×4): qty 1

## 2012-02-08 MED ORDER — NITROGLYCERIN IN D5W 200-5 MCG/ML-% IV SOLN
10.0000 ug/min | INTRAVENOUS | Status: DC
  Filled 2012-02-08: qty 250

## 2012-02-08 MED ORDER — CYCLOBENZAPRINE HCL 10 MG PO TABS: 5.0000 mg | ORAL_TABLET | Freq: Every day | ORAL | Status: DC | PRN

## 2012-02-08 MED ORDER — POTASSIUM CHLORIDE CRYS ER 10 MEQ PO TBCR
10.0000 meq | EXTENDED_RELEASE_TABLET | Freq: Two times a day (BID) | ORAL | Status: DC
  Administered 2012-02-08 – 2012-02-11 (×6): 10 meq via ORAL
  Filled 2012-02-08 (×7): qty 1

## 2012-02-08 MED ORDER — LISINOPRIL 20 MG PO TABS
20.0000 mg | ORAL_TABLET | Freq: Every day | ORAL | Status: DC
  Filled 2012-02-08 (×2): qty 1

## 2012-02-08 MED ORDER — SODIUM CHLORIDE 0.9 % IJ SOLN
3.0000 mL | Freq: Two times a day (BID) | INTRAMUSCULAR | Status: DC
  Administered 2012-02-08 – 2012-02-10 (×5): 3 mL via INTRAVENOUS

## 2012-02-08 MED ORDER — NICOTINE 14 MG/24HR TD PT24
14.0000 mg | MEDICATED_PATCH | Freq: Every day | TRANSDERMAL | Status: DC
  Administered 2012-02-08 – 2012-02-11 (×4): 14 mg via TRANSDERMAL
  Filled 2012-02-08 (×4): qty 1

## 2012-02-08 MED ORDER — TIOTROPIUM BROMIDE MONOHYDRATE 18 MCG IN CAPS
18.0000 ug | ORAL_CAPSULE | Freq: Every day | RESPIRATORY_TRACT | Status: DC
  Administered 2012-02-09 – 2012-02-11 (×3): 18 ug via RESPIRATORY_TRACT
  Filled 2012-02-08 (×2): qty 5

## 2012-02-08 MED ORDER — FENOFIBRATE 160 MG PO TABS
160.0000 mg | ORAL_TABLET | Freq: Every day | ORAL | Status: DC
  Administered 2012-02-09 – 2012-02-11 (×3): 160 mg via ORAL
  Filled 2012-02-08 (×4): qty 1

## 2012-02-08 MED ORDER — FOLIC ACID 1 MG PO TABS
1.0000 mg | ORAL_TABLET | Freq: Every day | ORAL | Status: DC
  Administered 2012-02-09 – 2012-02-11 (×3): 1 mg via ORAL
  Filled 2012-02-08 (×4): qty 1

## 2012-02-08 MED ORDER — ISOSORBIDE MONONITRATE ER 30 MG PO TB24
30.0000 mg | ORAL_TABLET | Freq: Every day | ORAL | Status: DC
  Filled 2012-02-08 (×2): qty 1

## 2012-02-08 MED ORDER — BUPRENORPHINE 20 MCG/HR TD PTWK: 20.0000 ug | MEDICATED_PATCH | TRANSDERMAL | Status: DC

## 2012-02-08 MED ORDER — ALBUTEROL SULFATE (5 MG/ML) 0.5% IN NEBU: 2.5000 mg | INHALATION_SOLUTION | Freq: Once | RESPIRATORY_TRACT | Status: DC

## 2012-02-08 MED ORDER — IOHEXOL 350 MG/ML SOLN
100.0000 mL | Freq: Once | INTRAVENOUS | Status: AC | PRN
  Administered 2012-02-08: 100 mL via INTRAVENOUS

## 2012-02-08 MED ORDER — PANTOPRAZOLE SODIUM 40 MG PO TBEC
40.0000 mg | DELAYED_RELEASE_TABLET | Freq: Every day | ORAL | Status: DC
  Administered 2012-02-08 – 2012-02-11 (×4): 40 mg via ORAL
  Filled 2012-02-08 (×4): qty 1

## 2012-02-08 MED ORDER — ACETAMINOPHEN 650 MG RE SUPP: 650.0000 mg | Freq: Four times a day (QID) | RECTAL | Status: DC | PRN

## 2012-02-08 MED ORDER — NITROGLYCERIN 0.4 MG SL SUBL: 0.4000 mg | SUBLINGUAL_TABLET | SUBLINGUAL | Status: DC | PRN

## 2012-02-08 MED ORDER — BUPROPION HCL ER (XL) 150 MG PO TB24
150.0000 mg | ORAL_TABLET | Freq: Every day | ORAL | Status: DC
  Administered 2012-02-09 – 2012-02-11 (×3): 150 mg via ORAL
  Filled 2012-02-08 (×4): qty 1

## 2012-02-08 MED ORDER — ALBUTEROL SULFATE (5 MG/ML) 0.5% IN NEBU
2.5000 mg | INHALATION_SOLUTION | Freq: Four times a day (QID) | RESPIRATORY_TRACT | Status: DC
  Administered 2012-02-08 – 2012-02-11 (×11): 2.5 mg via RESPIRATORY_TRACT
  Filled 2012-02-08 (×12): qty 0.5

## 2012-02-08 MED ORDER — IPRATROPIUM BROMIDE 0.02 % IN SOLN
RESPIRATORY_TRACT | Status: AC
  Administered 2012-02-08: 0.5 mg
  Filled 2012-02-08: qty 2.5

## 2012-02-08 MED ORDER — ONDANSETRON HCL 4 MG/2ML IJ SOLN: 4.0000 mg | Freq: Four times a day (QID) | INTRAMUSCULAR | Status: DC | PRN

## 2012-02-08 MED ORDER — FUROSEMIDE 10 MG/ML IJ SOLN: 40.0000 mg | Freq: Once | INTRAMUSCULAR | Status: DC

## 2012-02-08 MED ORDER — GABAPENTIN 300 MG PO CAPS
300.0000 mg | ORAL_CAPSULE | Freq: Three times a day (TID) | ORAL | Status: DC
  Administered 2012-02-08 – 2012-02-11 (×8): 300 mg via ORAL
  Filled 2012-02-08 (×10): qty 1

## 2012-02-08 MED ORDER — ATORVASTATIN CALCIUM 20 MG PO TABS
20.0000 mg | ORAL_TABLET | Freq: Every day | ORAL | Status: DC
  Administered 2012-02-09 – 2012-02-11 (×3): 20 mg via ORAL
  Filled 2012-02-08 (×4): qty 1

## 2012-02-08 NOTE — ED Notes (Addendum)
Patient arrived via EMS on CPAP. She is alert and oriented. Patient states she was ambulating to the bathroom and suddenly became severly short of breath. EMS states the patient had oxygen sats in the 70's on her home oxygen of 3L. She was placed on Bipap. EMS states her respiratory status was still compromised and she was switched to Cpap. EKG shows NSR. Patient has lower extremity edema right leg 2+ pitting edema, left leg 1+ pitting edema. Patient has had a 5 lbs increase in weight in 3 days.

## 2012-02-08 NOTE — Progress Notes (Signed)
Pt refused Xarelto this PM.  Pt states she has already taken her daily medications. Home med list states last time taken is "unknown". MD notified. Will continue to monitor. Jobe Igo A RN 02/08/2012

## 2012-02-08 NOTE — ED Provider Notes (Signed)
History     CSN: [id number]  Arrival date & time 02/08/12  1112   First MD Initiated Contact with Patient 02/08/12 1113      Chief Complaint  Patient presents with  . Shortness of Breath    (Consider location/radiation/quality/duration/timing/severity/associated sxs/prior treatment) The history is provided by the patient. The history is limited by the condition of the patient.  Aimee Sanders is a 66 y.o. female hx of COPD, CHF (EF 45%), DVT on coumadin here with SOB. Worsening short of breath for 3 days especially when she exerts herself. She has chronic cough but denies any fever. She was recently admitted for CHF and COPD exacerbation. She recently finished a course of prednisone. She notes that she has leg swelling as well as 5 pound weight gain in the last week or so. Per EMS became very tachypneic today and she was briefly put on BiPAP in route.   Level V caveat- condition of the patient   Past Medical History  Diagnosis Date  . Myocardial infarction     times 2  . CHF (congestive heart failure)   . Stroke   . Deep vein thrombosis     Phlebitis  . Hyperlipidemia   . Sleep apnea   . 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 Coronary artery disease   . Chronic pain     due to prior hip fracture and DDD back    Past Surgical History  Procedure Date  . Appendectomy   . Back surgery   . Cholecystectomy   . Coronary stent placement   . Breast surgery     benign tumors    Family History  Problem Relation Age of Onset  . Hypertension Mother   . Breast cancer Mother   . Rheum arthritis Maternal Grandmother     History  Substance Use Topics  . Smoking status: Current Every Day Smoker -- 1.0 packs/day for 40 years    Types: Cigarettes  . Smokeless tobacco: Not on file   Comment: started smoking in 1969  . Alcohol Use: No    OB History    Grav Para Term Preterm Abortions TAB SAB Ect Mult  Living                  Review of Systems  Respiratory: Positive for shortness of breath.   Musculoskeletal:       Leg swelling   All other systems reviewed and are negative.    Allergies  Aspirin and Penicillins  Home Medications   Current Outpatient Rx  Name Route Sig Dispense Refill  . ATORVASTATIN CALCIUM 20 MG PO TABS Oral Take 20 mg by mouth daily.    Marland Kitchen BUPRENORPHINE 20 MCG/HR TD PTWK Transdermal Place 20 mcg onto the skin once a week.    . BUPROPION HCL ER (XL) 150 MG PO TB24 Oral Take 150 mg by mouth daily.    Marland Kitchen CITALOPRAM HYDROBROMIDE 20 MG PO TABS Oral Take 20 mg by mouth daily.     Marland Kitchen CLONAZEPAM 0.5 MG PO TABS Oral Take 1 mg by mouth 3 (three) times daily as needed. For nervousness    . CYCLOBENZAPRINE HCL 5 MG PO TABS Oral Take 5 mg by mouth daily as needed.    . FENOFIBRATE 160 MG PO TABS Oral Take 160 mg by mouth daily.     Marland Kitchen FLUTICASONE-SALMETEROL 100-50 MCG/DOSE IN AEPB Inhalation Inhale 1  puff into the lungs every 12 (twelve) hours.    Marland Kitchen FOLIC ACID 1 MG PO TABS Oral Take 1 mg by mouth daily.    . FUROSEMIDE 80 MG PO TABS Oral Take 80 mg by mouth 2 (two) times daily.    Marland Kitchen GABAPENTIN 300 MG PO CAPS Oral Take 300 mg by mouth 3 (three) times daily.    . IPRATROPIUM-ALBUTEROL 0.5-2.5 (3) MG/3ML IN SOLN Nebulization Take 3 mLs by nebulization every 6 (six) hours as needed. For shortness of breath or wheezing    . ISOSORBIDE MONONITRATE ER 30 MG PO TB24 Oral Take 30 mg by mouth daily.    Marland Kitchen LISINOPRIL 20 MG PO TABS Oral Take 20 mg by mouth daily.    Marland Kitchen METOPROLOL TARTRATE 25 MG PO TABS Oral Take 25 mg by mouth 2 (two) times daily.     Marland Kitchen MONTELUKAST SODIUM 10 MG PO TABS Oral Take 10 mg by mouth at bedtime.    Marland Kitchen NITROGLYCERIN 0.4 MG SL SUBL Sublingual Place 0.4 mg under the tongue every 5 (five) minutes as needed. For chest pain    . OMEPRAZOLE 20 MG PO CPDR Oral Take 20 mg by mouth.    Marland Kitchen POTASSIUM CHLORIDE CRYS ER 10 MEQ PO TBCR Oral Take 1 tablet (10 mEq total) by mouth 2  (two) times daily. 60 tablet 0  . PROMETHAZINE HCL 25 MG PO TABS Oral Take 0.5-1 tablets (12.5-25 mg total) by mouth every 6 (six) hours as needed for nausea. For nausea 30 tablet 0  . RIVAROXABAN 20 MG PO TABS Oral Take 20 mg by mouth daily.    Marland Kitchen TIOTROPIUM BROMIDE MONOHYDRATE 18 MCG IN CAPS Inhalation Place 18 mcg into inhaler and inhale daily.     . TRAMADOL HCL 50 MG PO TABS Oral Take 50 mg by mouth 2 (two) times daily as needed. For pain    . VITAMIN D (ERGOCALCIFEROL) 50000 UNITS PO CAPS Oral Take 50,000 Units by mouth every 7 (seven) days. On Mondays      BP 103/63  Pulse 96  Temp 100.3 F (37.9 C) (Oral)  Resp 18  SpO2 94%  Physical Exam  Nursing note and vitals reviewed. Constitutional: She is oriented to person, place, and time.       Chronically ill, tachypneic. Not on bipap currently  HENT:  Head: Normocephalic.  Mouth/Throat: Oropharynx is clear and moist.  Eyes: Conjunctivae normal are normal. Pupils are equal, round, and reactive to light.  Neck: Normal range of motion. Neck supple.  Cardiovascular: Normal rate, regular rhythm and normal heart sounds.   Pulmonary/Chest:       Increased effort, + diffuse wheezing, decreased breath sounds bilateral bases. Not in severe distress.   Abdominal: Soft. Bowel sounds are normal. She exhibits no distension. There is no tenderness.  Musculoskeletal: Normal range of motion.       R leg 2+ edema L leg 1+ edema.   Neurological: She is alert and oriented to person, place, and time.  Skin: Skin is warm and dry.  Psychiatric: She has a normal mood and affect. Her behavior is normal. Judgment and thought content normal.    ED Course  Procedures (including critical care time)  Labs Reviewed  CBC WITH DIFFERENTIAL - Abnormal; Notable for the following:    RBC 3.57 (*)     Hemoglobin 10.6 (*)     HCT 32.9 (*)     RDW 15.9 (*)     All other components within normal  limits  COMPREHENSIVE METABOLIC PANEL - Abnormal; Notable for  the following:    CO2 35 (*)     Glucose, Bld 100 (*)     Total Protein 5.9 (*)     Albumin 2.6 (*)     Total Bilirubin 0.2 (*)     All other components within normal limits  PRO B NATRIURETIC PEPTIDE - Abnormal; Notable for the following:    Pro B Natriuretic peptide (BNP) 1958.0 (*)     All other components within normal limits  TROPONIN I  PROTIME-INR  BLOOD GAS, ARTERIAL   Ct Angio Chest Pe W/cm &/or Wo Cm  02/08/2012  *RADIOLOGY REPORT*  Clinical Data: Sudden onset shortness of breath.  Lower extremity edema.  CT ANGIOGRAPHY CHEST  Technique:  Multidetector CT imaging of the chest using the standard protocol during bolus administration of intravenous contrast. Multiplanar reconstructed images including MIPs were obtained and reviewed to evaluate the vascular anatomy.  Contrast: OMNIPAQUE IOHEXOL 350 MG/ML SOLN  Comparison: 11/24/2011.  Findings: Evaluation for subsegmental pulmonary emboli is somewhat limited by respiratory motion.  No central, lumbar or segmental pulmonary embolus.  No pathologically enlarged mediastinal, hilar or axillary lymph nodes.  Atherosclerotic calcification of the arterial vasculature, including coronary arteries.  Pulmonary arteries are borderline enlarged.  Heart is enlarged.  No pericardial effusion.  Again, respiratory motion degrades image quality upon review of the lungs.  Mild emphysematous changes.  Chronic-appearing volume loss adjacent to the left heart border, within the lingula.  Scarring or atelectasis in the lower lobes, left greater than right.  No pleural fluid.  Airway is unremarkable.  Incidental imaging of the upper abdomen shows an irregular liver margin.  Appear low attenuation in the upper pole right kidney may be artifactual, related to streak artifact from adjacent vertebroplasty, as there is no corresponding abnormality on examination of 11/24/2011 or 06/13/2010.  No worrisome lytic or sclerotic lesions.  Vertebroplasties and old right  rib fractures are noted.  C7 compression fracture appears new from 11/24/2011.  There is a new superior endplate compression fracture involving T4. L1 compression fracture is stable.  IMPRESSION:  1.  Evaluation for subsegmental pulmonary emboli is limited by respiratory motion.  No central, lobar or segmental pulmonary embolus. 2.  Mildly irregular liver margin is indicative of cirrhosis. 3.  New C7 and T4 compression fractures.   Original Report Authenticated By: Reyes Ivan, M.D.    Dg Chest Portable 1 View  02/08/2012  *RADIOLOGY REPORT*  Clinical Data: Shortness of breath.  Prior history of MI, CHF, stroke, DVT.  Smoker with current history of COPD.  PORTABLE CHEST - 1 VIEW 02/08/2012 1128 hours:  Comparison: Two-view chest x-ray 12/31/2011 [facility] and 06/11/2010 [facility].  Portable chest x-ray 12/07/2011 [facility].  Findings: Cardiac silhouette moderately enlarged but stable. Thoracic aorta mildly tortuous and atherosclerotic, unchanged. Hilar and mediastinal contours otherwise unremarkable.  Pulmonary vascularity normal.  Scarring at the left lung base, unchanged.  No new pulmonary parenchymal abnormalities.  Prior augmentation of 3 mid and lower thoracic vertebrae.  IMPRESSION: Stable cardiomegaly.  Scarring at the left lung base.  No acute cardiopulmonary disease.   Original Report Authenticated By: Arnell Sieving, M.D.      1. COPD exacerbation   2. CHF (congestive heart failure)      Date: 02/08/2012  Rate: 72  Rhythm: normal sinus rhythm  QRS Axis: normal  Intervals: normal  ST/T Wave abnormalities: normal  Conduction Disutrbances:left anterior fascicular  block  Narrative Interpretation:   Old EKG Reviewed: unchanged    MDM  VENESA SEMIDEY is a 66 y.o. female here with SOB. The etiology of her SOB is likely COPD vs CHF vs ACS vs PE. Will check labs, cxr, bnp, and INR. Will also give steroids, nebs, start on nitro drip. If INR low,  will get CTPA to r/o PE.   2:58 PM Labs unremarkable except BNP 2000 (similar to last time). Her BP has been around low 100s so I held off on the lasix. She was treated for COPD exacerbation and now feels better on nasal canula. CTPA showed no PE. I also gave avelox for COPD exacerbation and low grade temp. Patient admitted to tele. As per Dr. Catalina Pizza, request ABG and IV lasix if BP is better.         Richardean Canal, MD 02/08/12 1501

## 2012-02-08 NOTE — H&P (Signed)
[facility] History and Physical  Aimee Sanders EXB:[id number] DOB: Aug 30, 1945 DOA: 02/08/2012  Referring physician: Dr. Chaney Malling PCP: Bennie Pierini, FNP  Specialists:   Chief Complaint: Worsening dyspnea  HPI: Aimee Sanders is a 66 y.o. female with history of O2 dependent (3 L/min) chronic respiratory failure, ongoing tobacco abuse, COPD, chronic systolic congestive heart failure (EF 40-45%) and, DVT on Xarelto, coronary artery disease, chronic pain, sleep apnea presented to the emergency department with 3 days history of progressively worsening dyspnea. Since Friday of last week, patient has noted worsening dyspnea, wheezing, tightening the chest, new cough productive of light yellow/green sputum. She denies fever or chills. She has some chest pains only on coughing. She used her nebulizations with some relief. She indicates that she's been compliant with her Lasix but not fluid restriction. She claims she has gained 5 pounds of weight recently.Aimee Sanders She was brought by EMS on CPAP. Per EMS patient's oxygen saturations were in the 70s on home oxygen of 3 L per minute. In the ED patient has received IV Solu-Medrol, bronchodilator nebulizations, a dose of Avelox and Lasix. She says she feels significantly better with improved dyspnea. The hospitalist service is requested to admit for further evaluation and management. CTA chest did not show any large PE.  Review of Systems: All systems reviewed and apart from history of presenting illness, are negative.  Past Medical History  Diagnosis Date  . Myocardial infarction     times 2  . CHF (congestive heart failure)   . Stroke   . Deep vein thrombosis     Phlebitis  . Hyperlipidemia   . Sleep apnea   . 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 Coronary artery disease   . Chronic pain     due to prior hip fracture and DDD back   Past Surgical History  Procedure  Date  . Appendectomy   . Back surgery   . Cholecystectomy   . Coronary stent placement   . Breast surgery     benign tumors   Social History:  reports that she has been smoking Cigarettes.  She has a 40 pack-year smoking history. She does not have any smokeless tobacco history on file. She reports that she does not drink alcohol or use illicit drugs. Patient lives with her family. She ambulates with the help of a walker and is able to carry some of her activities of daily living. She smokes half a pack per day but this may not be the true amount.  Allergies  Allergen Reactions  . Aspirin Other (See Comments)    Son does not remember reaction  . Penicillins Hives and Itching    Family History  Problem Relation Age of Onset  . Hypertension Mother   . Breast cancer Mother   . Rheum arthritis Maternal Grandmother   . Cancer Sister    Prior to Admission medications   Medication Sig Start Date End Date Taking? Authorizing Provider  atorvastatin (LIPITOR) 20 MG tablet Take 20 mg by mouth daily.   Yes Historical Provider, MD  buprenorphine (BUTRANS) 20 MCG/HR PTWK Place 20 mcg onto the skin once a week.   Yes Historical Provider, MD  buPROPion (WELLBUTRIN XL) 150 MG 24 hr tablet Take 150 mg by mouth daily.   Yes Historical Provider, MD  citalopram (CELEXA) 20 MG tablet Take 20 mg by mouth daily.    Yes Historical Provider,  MD  clonazePAM (KLONOPIN) 0.5 MG tablet Take 1 mg by mouth 3 (three) times daily as needed. For nervousness   Yes Historical Provider, MD  cyclobenzaprine (FLEXERIL) 5 MG tablet Take 5 mg by mouth daily as needed.   Yes Historical Provider, MD  fenofibrate 160 MG tablet Take 160 mg by mouth daily.    Yes Historical Provider, MD  Fluticasone-Salmeterol (ADVAIR) 100-50 MCG/DOSE AEPB Inhale 1 puff into the lungs every 12 (twelve) hours.   Yes Historical Provider, MD  folic acid (FOLVITE) 1 MG tablet Take 1 mg by mouth daily.   Yes Historical Provider, MD  furosemide  (LASIX) 80 MG tablet Take 80 mg by mouth 2 (two) times daily.   Yes Historical Provider, MD  gabapentin (NEURONTIN) 300 MG capsule Take 300 mg by mouth 3 (three) times daily.   Yes Historical Provider, MD  ipratropium-albuterol (DUONEB) 0.5-2.5 (3) MG/3ML SOLN Take 3 mLs by nebulization every 6 (six) hours as needed. For shortness of breath or wheezing   Yes Historical Provider, MD  isosorbide mononitrate (IMDUR) 30 MG 24 hr tablet Take 30 mg by mouth daily. 04/06/11 04/05/12 Yes June Leap, MD  lisinopril (PRINIVIL,ZESTRIL) 20 MG tablet Take 20 mg by mouth daily.   Yes Historical Provider, MD  metoprolol tartrate (LOPRESSOR) 25 MG tablet Take 25 mg by mouth 2 (two) times daily.    Yes Historical Provider, MD  montelukast (SINGULAIR) 10 MG tablet Take 10 mg by mouth at bedtime.   Yes Historical Provider, MD  nitroGLYCERIN (NITROSTAT) 0.4 MG SL tablet Place 0.4 mg under the tongue every 5 (five) minutes as needed. For chest pain   Yes Historical Provider, MD  omeprazole (PRILOSEC) 20 MG capsule Take 20 mg by mouth.   Yes Historical Provider, MD  potassium chloride (K-DUR,KLOR-CON) 10 MEQ tablet Take 1 tablet (10 mEq total) by mouth 2 (two) times daily. 01/04/12 01/03/13 Yes Meredeth Ide, MD  promethazine (PHENERGAN) 25 MG tablet Take 0.5-1 tablets (12.5-25 mg total) by mouth every 6 (six) hours as needed for nausea. For nausea 01/04/12  Yes Meredeth Ide, MD  Rivaroxaban (XARELTO) 20 MG TABS Take 20 mg by mouth daily.   Yes Historical Provider, MD  tiotropium (SPIRIVA) 18 MCG inhalation capsule Place 18 mcg into inhaler and inhale daily.    Yes Historical Provider, MD  traMADol (ULTRAM) 50 MG tablet Take 50 mg by mouth 2 (two) times daily as needed. For pain   Yes Historical Provider, MD  Vitamin D, Ergocalciferol, (DRISDOL) 50000 UNITS CAPS Take 50,000 Units by mouth every 7 (seven) days. On Mondays   Yes Historical Provider, MD   Physical Exam: Filed Vitals:   02/08/12 1430 02/08/12 1456 02/08/12  1505 02/08/12 1545  BP: 108/58 103/63 108/64 105/58  Pulse: 96  96 92  Temp:      TempSrc:      Resp: 28 18 22    SpO2: 94% 94% 96% 97%     General exam: Moderately built and nourished female patient, looks older than stated age, lying comfortably supine on the gurney and is in no obvious distress.  Head, eyes and ENT: Nontraumatic and normocephalic pupils equally reacting to light and accommodation. Edentulous. Bilateral immature cataracts.  Lymphatics: No lymphadenopathy.  Neck: Supple. No JVD, carotid bruit or thyromegaly.  Respiratory system: Reduced breath sounds bilaterally with bilateral few expiratory rhonchi. No crackles. No increased work of breathing. Able to talk in long sentences.  Cardiovascular system: S1 and S2 heard and  regular. No JVD, murmurs or gallops. Trace bilateral leg edema.  Gastrointestinal system: Abdomen is nondistended, soft with normal bowel sounds. Minimal non-consistent left lower quadrant tenderness but without rigidity guarding or rebound. No organomegaly or masses appreciated.  Central system: Alert and oriented. No focal neurological deficits.  Extremities: Symmetric 5 x 5 power. Peripheral pulses symmetrically felt.  Skin: No acute findings.  Musculoskeletal system: Negative.  Psychiatric: Pleasant and interactive.   Labs on Admission:  Basic Metabolic Panel:  Lab 02/08/12 1610  NA 140  K 4.0  CL 101  CO2 35*  GLUCOSE 100*  BUN 7  CREATININE 0.59  CALCIUM 9.1  MG --  PHOS --   Liver Function Tests:  Lab 02/08/12 1121  AST 25  ALT 14  ALKPHOS 49  BILITOT 0.2*  PROT 5.9*  ALBUMIN 2.6*   No results found for this basename: LIPASE:5,AMYLASE:5 in the last 168 hours No results found for this basename: AMMONIA:5 in the last 168 hours CBC:  Lab 02/08/12 1121  WBC 8.3  NEUTROABS 4.5  HGB 10.6*  HCT 32.9*  MCV 92.2  PLT 315   Cardiac Enzymes:  Lab 02/08/12 1121  CKTOTAL --  CKMB --  CKMBINDEX --  TROPONINI <0.30      BNP (last 3 results)  Basename 02/08/12 1121 01/01/12 1612 11/23/11 1829  PROBNP 1958.0* 2063.0* 462.7*   CBG: No results found for this basename: GLUCAP:5 in the last 168 hours  Radiological Exams on Admission: Ct Angio Chest Pe W/cm &/or Wo Cm  02/08/2012  *RADIOLOGY REPORT*  Clinical Data: Sudden onset shortness of breath.  Lower extremity edema.  CT ANGIOGRAPHY CHEST  Technique:  Multidetector CT imaging of the chest using the standard protocol during bolus administration of intravenous contrast. Multiplanar reconstructed images including MIPs were obtained and reviewed to evaluate the vascular anatomy.  Contrast: OMNIPAQUE IOHEXOL 350 MG/ML SOLN  Comparison: 11/24/2011.  Findings: Evaluation for subsegmental pulmonary emboli is somewhat limited by respiratory motion.  No central, lumbar or segmental pulmonary embolus.  No pathologically enlarged mediastinal, hilar or axillary lymph nodes.  Atherosclerotic calcification of the arterial vasculature, including coronary arteries.  Pulmonary arteries are borderline enlarged.  Heart is enlarged.  No pericardial effusion.  Again, respiratory motion degrades image quality upon review of the lungs.  Mild emphysematous changes.  Chronic-appearing volume loss adjacent to the left heart border, within the lingula.  Scarring or atelectasis in the lower lobes, left greater than right.  No pleural fluid.  Airway is unremarkable.  Incidental imaging of the upper abdomen shows an irregular liver margin.  Appear low attenuation in the upper pole right kidney may be artifactual, related to streak artifact from adjacent vertebroplasty, as there is no corresponding abnormality on examination of 11/24/2011 or 06/13/2010.  No worrisome lytic or sclerotic lesions.  Vertebroplasties and old right rib fractures are noted.  C7 compression fracture appears new from 11/24/2011.  There is a new superior endplate compression fracture involving T4. L1 compression  fracture is stable.  IMPRESSION:  1.  Evaluation for subsegmental pulmonary emboli is limited by respiratory motion.  No central, lobar or segmental pulmonary embolus. 2.  Mildly irregular liver margin is indicative of cirrhosis. 3.  New C7 and T4 compression fractures.   Original Report Authenticated By: Reyes Ivan, M.D.    Dg Chest Portable 1 View  02/08/2012  *RADIOLOGY REPORT*  Clinical Data: Shortness of breath.  Prior history of MI, CHF, stroke, DVT.  Smoker with current history of  COPD.  PORTABLE CHEST - 1 VIEW 02/08/2012 1128 hours:  Comparison: Two-view chest x-ray 12/31/2011 [place] and 06/11/2010 [facility].  Portable chest x-ray 12/07/2011 [facility].  Findings: Cardiac silhouette moderately enlarged but stable. Thoracic aorta mildly tortuous and atherosclerotic, unchanged. Hilar and mediastinal contours otherwise unremarkable.  Pulmonary vascularity normal.  Scarring at the left lung base, unchanged.  No new pulmonary parenchymal abnormalities.  Prior augmentation of 3 mid and lower thoracic vertebrae.  IMPRESSION: Stable cardiomegaly.  Scarring at the left lung base.  No acute cardiopulmonary disease.   Original Report Authenticated By: Arnell Sieving, M.D.     EKG: Independently reviewed. NSR. LAD. No acute changes. Q waves in III and AVF.  Assessment/Plan Active Problems:  HYPERLIPIDEMIA  SMOKER  DEEP VEIN THROMBOSIS/PHLEBITIS  SLEEP APNEA  COPD with acute exacerbation  Respiratory failure, acute-on-chronic  Anemia  Systolic CHF, acute on chronic   1. Acute on chronic hypoxic respiratory failure: Chronic respiratory failure secondary to COPD, chronic systolic heart failure and OSA. Acute respiratory failure possibly secondary to acute COPD exacerbation and mild CHF decompensation. Admit to telemetry. Treat underlying cause. Oxygen and bronchodilator nebulization. Followup ABG. 2. Acute COPD exacerbation: oxygen, bronchodilator  nebulization's, brief IV steroids and IV Avelox. 3. Acute on chronic systolic heart failure: Patient had recently gained weight. Briefly switched to IV Lasix. Continue other home cardiac medications. 4. DVT: Continue Xarelto 5. Tobacco abuse: Cessation counseled. Patient requests a nicotine patch. 6. Anemia: Stable 7. CAD: Asymptomatic  Code Status: Full Family Communication: discussed with patient directly. Disposition Plan: Home.  Time spent: 55 mins.  [facility] Pager [phone]  If 7PM-7AM, please contact night-coverage [email or web address] Password [facility] 02/08/2012, 4:27 PM

## 2012-02-08 NOTE — ED Notes (Signed)
Patient's BP too low to start Ntg gtt. MD made aware.

## 2012-02-08 NOTE — Progress Notes (Signed)
[facility]  Patient Status: Active (receiving services up to time of hospitalization)  [facility] is providing the following services: RN  If patient discharges after hours, please call [phone].   Jodene Nam 02/08/2012, 8:12 PM

## 2012-02-08 NOTE — Progress Notes (Signed)
Patient came in via EMS on CPAP.  Patient sats were 100% on CPAP.  RT took patient off CPAP to assess.  Patient stated that she felt better off of the CPAP.  RT placed patient on 3L [place].  Patient sats are still 100 RR 20-24.  Tolerating well at this time.  MD aware

## 2012-02-09 DIAGNOSIS — G4730 Sleep apnea, unspecified: Secondary | ICD-10-CM

## 2012-02-09 DIAGNOSIS — E785 Hyperlipidemia, unspecified: Secondary | ICD-10-CM

## 2012-02-09 DIAGNOSIS — F17200 Nicotine dependence, unspecified, uncomplicated: Secondary | ICD-10-CM

## 2012-02-09 LAB — BASIC METABOLIC PANEL
CO2: 31 mEq/L (ref 19–32)
Chloride: 96 mEq/L (ref 96–112)
Sodium: 138 mEq/L (ref 135–145)

## 2012-02-09 LAB — GLUCOSE, CAPILLARY: Glucose-Capillary: 225 mg/dL — ABNORMAL HIGH (ref 70–99)

## 2012-02-09 LAB — CBC
HCT: 32 % — ABNORMAL LOW (ref 36.0–46.0)
Hemoglobin: 10.6 g/dL — ABNORMAL LOW (ref 12.0–15.0)
MCHC: 33.1 g/dL (ref 30.0–36.0)
RBC: 3.57 MIL/uL — ABNORMAL LOW (ref 3.87–5.11)

## 2012-02-09 MED ORDER — MOXIFLOXACIN HCL 400 MG PO TABS
400.0000 mg | ORAL_TABLET | Freq: Every day | ORAL | Status: DC
  Administered 2012-02-09 – 2012-02-10 (×2): 400 mg via ORAL
  Filled 2012-02-09 (×3): qty 1

## 2012-02-09 MED ORDER — FUROSEMIDE 80 MG PO TABS
80.0000 mg | ORAL_TABLET | Freq: Two times a day (BID) | ORAL | Status: DC
  Filled 2012-02-09: qty 1

## 2012-02-09 MED ORDER — OXYCODONE-ACETAMINOPHEN 10-325 MG PO TABS: 1.0000 | ORAL_TABLET | Freq: Four times a day (QID) | ORAL | Status: DC | PRN

## 2012-02-09 MED ORDER — PREDNISONE 50 MG PO TABS
50.0000 mg | ORAL_TABLET | Freq: Every day | ORAL | Status: DC
  Administered 2012-02-10 – 2012-02-11 (×2): 50 mg via ORAL
  Filled 2012-02-09 (×3): qty 1

## 2012-02-09 MED ORDER — OXYCODONE-ACETAMINOPHEN 5-325 MG PO TABS
1.0000 | ORAL_TABLET | Freq: Four times a day (QID) | ORAL | Status: DC | PRN
  Administered 2012-02-10 – 2012-02-11 (×4): 1 via ORAL
  Filled 2012-02-09 (×4): qty 1

## 2012-02-09 MED ORDER — METOPROLOL TARTRATE 25 MG PO TABS
25.0000 mg | ORAL_TABLET | Freq: Two times a day (BID) | ORAL | Status: DC
  Administered 2012-02-09: 25 mg via ORAL
  Filled 2012-02-09 (×4): qty 1

## 2012-02-09 MED ORDER — FUROSEMIDE 80 MG PO TABS
80.0000 mg | ORAL_TABLET | Freq: Two times a day (BID) | ORAL | Status: DC
  Administered 2012-02-09 – 2012-02-10 (×2): 80 mg via ORAL
  Filled 2012-02-09 (×5): qty 1

## 2012-02-09 MED ORDER — FUROSEMIDE 10 MG/ML IJ SOLN
INTRAMUSCULAR | Status: AC
  Filled 2012-02-09: qty 8

## 2012-02-09 MED ORDER — SODIUM CHLORIDE 0.9 % IV BOLUS (SEPSIS)
500.0000 mL | Freq: Once | INTRAVENOUS | Status: AC
  Administered 2012-02-09: 500 mL via INTRAVENOUS

## 2012-02-09 MED ORDER — OXYCODONE HCL 5 MG PO TABS
5.0000 mg | ORAL_TABLET | Freq: Four times a day (QID) | ORAL | Status: DC | PRN
  Administered 2012-02-11 (×2): 5 mg via ORAL
  Filled 2012-02-09 (×2): qty 1

## 2012-02-09 MED ORDER — LISINOPRIL 20 MG PO TABS
20.0000 mg | ORAL_TABLET | Freq: Every day | ORAL | Status: DC
  Administered 2012-02-09: 20 mg via ORAL
  Filled 2012-02-09 (×2): qty 1

## 2012-02-09 MED ORDER — ISOSORBIDE MONONITRATE ER 30 MG PO TB24
30.0000 mg | ORAL_TABLET | Freq: Every day | ORAL | Status: DC
  Administered 2012-02-09 – 2012-02-10 (×2): 30 mg via ORAL
  Filled 2012-02-09 (×2): qty 1

## 2012-02-09 NOTE — Progress Notes (Signed)
I cosign for Aimee Sanders's assessment, med administration, notes, I/O, and care plan/education.  

## 2012-02-09 NOTE — Progress Notes (Signed)
Patient's BP is 86/33, MD notified and pm Lasix on hold as well as Percocet until BP increases per MD; will continue to monitor patient.  Lorretta Harp RN

## 2012-02-09 NOTE — Care Management Note (Signed)
    Page 1 of 1   02/09/2012     10:38:10 AM   CARE MANAGEMENT NOTE 02/09/2012  Patient:  Aimee Sanders, Aimee Sanders   Account Number:  [id number]  Date Initiated:  02/09/2012  Documentation initiated by:  Tera Mater  Subjective/Objective Assessment:   66yo female admitted with COPD exacerbation. Pt. lives at home with her daughter.     Action/Plan:   Pt. is currently active with [facility] for [facility] RN.   Anticipated DC Date:  02/10/2012   Anticipated DC Plan:  HOME W HOME HEALTH SERVICES      DC Planning Services  CM consult      [facility] Choice  Resumption Of Svcs/PTA Provider   Choice offered to / List presented to:             [facility] agency  [facility]   Status of service:  In process, will continue to follow Medicare Important Message given?   (If response is "NO", the following Medicare IM given date fields will be blank) Date Medicare IM given:   Date Additional Medicare IM given:    Discharge Disposition:    Per UR Regulation:  Reviewed for med. necessity/level of care/duration of stay  If discussed at Long Length of Stay Meetings, dates discussed:    Comments:  02/09/12 1037 Pt. is active with [facility] and has home oxygen at 3LPM  [place].  May be dc tomorrow. NCM to follow for any further dc needs.  Tera Mater, RN, BSN NCM [phone]

## 2012-02-09 NOTE — Progress Notes (Signed)
PT Cancellation Note  Patient Details Name: VALEDA CORZINE MRN: [id number] DOB: January 28, 1946   Cancelled Treatment:    Reason Eval/Treat Not Completed: Pt just back to bed after ambulating with nursing. Will f/u tomorrow.     [facility] HELEN 02/09/2012, 2:54 PM Pager: [phone]

## 2012-02-09 NOTE — Progress Notes (Signed)
Patient ambulated approximately 150 ft in the hallway on 3L [place] with walker, patient did not have any problems ambulating but her O2 sats did stay between 82-84% on 3L [place], after the patient got back to her room, her oxygen increased to 94% on 3Lnc; will continue to monitor patient.  Lorretta Harp RN

## 2012-02-09 NOTE — Progress Notes (Signed)
[facility] PROGRESS NOTE  Aimee Sanders UYQ:[id number] DOB: 1945-10-21 DOA: 02/08/2012 PCP: Bennie Pierini, FNP  Assessment/Plan 1. Acute on chronic hypoxic respiratory failure: Chronic respiratory failure secondary to COPD, chronic systolic heart failure and OSA. Acute respiratory failure possibly secondary to acute COPD exacerbation and mild CHF decompensation.  Much improved this am. Pt reports almost to baseline. Sats >95% on 3L which is what she uses at home. Will continue Oxygen and bronchodilator nebulization. Will ambulate and check sats. 2. Acute COPD exacerbation: much improved this am. Will continue oxygen, bronchodilator nebulization's.Will transition IV steroids after 2pm dose today to po prednisone. Will change IV avelox to po. Afebrile. Non-toxic appearing.  3. Acute on chronic systolic heart failure: Patient had recently gained weight. Briefly switched to IV Lasix. Will resume home dose tomorrow. Wt 58.7kg down from 60.6kg on admission.  Continue other home cardiac medications. 4. DVT: Continue Xarelto 5. Tobacco abuse: Cessation counseled. continue a nicotine patch. 6. Anemia: Stable 7. CAD: Asymptomatic    Code Status: full  Family Communication: patient at bedside Disposition Plan: likely home in am   Consultants:  none  Procedures:  none  Antibiotics:  Avelox 02/08/12  HPI/Subjective: Eyes closed arouses to verbal stimuli. Oriented x3. Denies pain/discomfort. Reports improvement in breathing.   Objective: Filed Vitals:   02/08/12 2208 02/09/12 0030 02/09/12 0154 02/09/12 0536  BP: 99/62   98/38  Pulse: 89  75 79  Temp:    98 F (36.7 C)  TempSrc:    Oral  Resp:   17 17  Height:      Weight:    58.7 kg (129 lb 6.6 oz)  SpO2:  91% 94% 95%    Intake/Output Summary (Last 24 hours) at 02/09/12 0836 Last data filed at 02/09/12 0543  Gross per 24 hour  Intake    240 ml  Output   2450 ml  Net  -2210 ml   Filed Weights   02/08/12  1706 02/09/12 0536  Weight: 60.6 kg (133 lb 9.6 oz) 58.7 kg (129 lb 6.6 oz)    Exam:   General:  Alert oriented, well nourished NAD  Cardiovascular: RRR, No MGR, trace LEE PPP feet tender to palpation  Respiratory: normal effort,somewhat shallow. BS distant. No wheeze, rhonchi or crackles. Fair air flow. No use accessory muscles  Abdomen: obese, soft, +BS Non-tender to palpation. No mass/organmegaly  Data Reviewed: Basic Metabolic Panel:  Lab 02/09/12 6387 02/08/12 1121  NA 138 140  K 3.8 4.0  CL 96 101  CO2 31 35*  GLUCOSE 174* 100*  BUN 10 7  CREATININE 0.62 0.59  CALCIUM 9.3 9.1  MG -- --  PHOS -- --   Liver Function Tests:  Lab 02/08/12 1121  AST 25  ALT 14  ALKPHOS 49  BILITOT 0.2*  PROT 5.9*  ALBUMIN 2.6*   No results found for this basename: LIPASE:5,AMYLASE:5 in the last 168 hours No results found for this basename: AMMONIA:5 in the last 168 hours CBC:  Lab 02/09/12 0555 02/08/12 1121  WBC 7.2 8.3  NEUTROABS -- 4.5  HGB 10.6* 10.6*  HCT 32.0* 32.9*  MCV 89.6 92.2  PLT 347 315   Cardiac Enzymes:  Lab 02/08/12 1121  CKTOTAL --  CKMB --  CKMBINDEX --  TROPONINI <0.30   BNP (last 3 results)  Basename 02/08/12 1121 01/01/12 1612 11/23/11 1829  PROBNP 1958.0* 2063.0* 462.7*   CBG:  Lab 02/09/12 0625  GLUCAP 225*    No results found for  this or any previous visit (from the past 240 hour(s)).   Studies: Ct Angio Chest Pe W/cm &/or Wo Cm  02/08/2012  *RADIOLOGY REPORT*  Clinical Data: Sudden onset shortness of breath.  Lower extremity edema.  CT ANGIOGRAPHY CHEST  Technique:  Multidetector CT imaging of the chest using the standard protocol during bolus administration of intravenous contrast. Multiplanar reconstructed images including MIPs were obtained and reviewed to evaluate the vascular anatomy.  Contrast: OMNIPAQUE IOHEXOL 350 MG/ML SOLN  Comparison: 11/24/2011.  Findings: Evaluation for subsegmental pulmonary emboli is somewhat  limited by respiratory motion.  No central, lumbar or segmental pulmonary embolus.  No pathologically enlarged mediastinal, hilar or axillary lymph nodes.  Atherosclerotic calcification of the arterial vasculature, including coronary arteries.  Pulmonary arteries are borderline enlarged.  Heart is enlarged.  No pericardial effusion.  Again, respiratory motion degrades image quality upon review of the lungs.  Mild emphysematous changes.  Chronic-appearing volume loss adjacent to the left heart border, within the lingula.  Scarring or atelectasis in the lower lobes, left greater than right.  No pleural fluid.  Airway is unremarkable.  Incidental imaging of the upper abdomen shows an irregular liver margin.  Appear low attenuation in the upper pole right kidney may be artifactual, related to streak artifact from adjacent vertebroplasty, as there is no corresponding abnormality on examination of 11/24/2011 or 06/13/2010.  No worrisome lytic or sclerotic lesions.  Vertebroplasties and old right rib fractures are noted.  C7 compression fracture appears new from 11/24/2011.  There is a new superior endplate compression fracture involving T4. L1 compression fracture is stable.  IMPRESSION:  1.  Evaluation for subsegmental pulmonary emboli is limited by respiratory motion.  No central, lobar or segmental pulmonary embolus. 2.  Mildly irregular liver margin is indicative of cirrhosis. 3.  New C7 and T4 compression fractures.   Original Report Authenticated By: Reyes Ivan, M.D.    Dg Chest Portable 1 View  02/08/2012  *RADIOLOGY REPORT*  Clinical Data: Shortness of breath.  Prior history of MI, CHF, stroke, DVT.  Smoker with current history of COPD.  PORTABLE CHEST - 1 VIEW 02/08/2012 1128 hours:  Comparison: Two-view chest x-ray 12/31/2011 [place] and 06/11/2010 [facility].  Portable chest x-ray 12/07/2011 [facility].  Findings: Cardiac silhouette moderately enlarged but stable.  Thoracic aorta mildly tortuous and atherosclerotic, unchanged. Hilar and mediastinal contours otherwise unremarkable.  Pulmonary vascularity normal.  Scarring at the left lung base, unchanged.  No new pulmonary parenchymal abnormalities.  Prior augmentation of 3 mid and lower thoracic vertebrae.  IMPRESSION: Stable cardiomegaly.  Scarring at the left lung base.  No acute cardiopulmonary disease.   Original Report Authenticated By: Arnell Sieving, M.D.     Scheduled Meds:   . albuterol  2.5 mg Nebulization Q6H  . albuterol  5 mg Nebulization Once  . albuterol  5 mg Nebulization Once  . atorvastatin  20 mg Oral Daily  . buprenorphine  20 mcg Transdermal Weekly  . buPROPion  150 mg Oral Daily  . citalopram  20 mg Oral Daily  . fenofibrate  160 mg Oral Daily  . folic acid  1 mg Oral Daily  . furosemide      . furosemide  20 mg Intravenous Once  . furosemide  60 mg Intravenous Q12H  . gabapentin  300 mg Oral TID  . ipratropium      . isosorbide mononitrate  30 mg Oral Daily  . lisinopril  20 mg Oral  Daily  . methylPREDNISolone (SOLU-MEDROL) injection  125 mg Intravenous Once  . methylPREDNISolone (SOLU-MEDROL) injection  60 mg Intravenous Q8H  . metoprolol tartrate  25 mg Oral BID  . montelukast  10 mg Oral QHS  . moxifloxacin  400 mg Intravenous Once  . moxifloxacin  400 mg Intravenous Q24H  . nicotine  14 mg Transdermal Daily  . pantoprazole  40 mg Oral Daily  . potassium chloride  10 mEq Oral BID  . Rivaroxaban  20 mg Oral Q supper  . sodium chloride  3 mL Intravenous Q12H  . tiotropium  18 mcg Inhalation Daily  . DISCONTD: albuterol  2.5 mg Nebulization Once  . DISCONTD: albuterol  2.5 mg Nebulization Once  . DISCONTD: albuterol  5.05 mg Nebulization Once  . DISCONTD: furosemide  20 mg Intravenous Once  . DISCONTD: furosemide  40 mg Intravenous Once  . DISCONTD: ipratropium  0.5 mg Nebulization Once   Continuous Infusions:   . DISCONTD: nitroGLYCERIN      Active  Problems:  HYPERLIPIDEMIA  SMOKER  DEEP VEIN THROMBOSIS/PHLEBITIS  SLEEP APNEA  COPD with acute exacerbation  Respiratory failure, acute-on-chronic  Anemia  Systolic CHF, acute on chronic    Time spent: 30 minutes    [facility]  [facility]  If 8PM-8AM, please contact night-coverage at [email or web address], password [facility] 02/09/2012, 8:36 AM  LOS: 1 day

## 2012-02-09 NOTE — Progress Notes (Signed)
Received a phone call from patient's daughter stating that the patient called her upset about her medications.  The patient is concerned that her home medications are not correct her medication reconciliation.  MD notified and orders placed for pharmacy consult.  Pharmacy notified and states that a pharmacy tech will come and review med list with patient.  Lorretta Harp RN

## 2012-02-09 NOTE — Progress Notes (Signed)
Patient seen and examined by me.  Agree with plan.  Needs to get OOB.  Wean off solumedrol.  Still smoking back on home dose of 3L.  BP low so holding parameters on medications.  Marlin Canary DO

## 2012-02-09 NOTE — Progress Notes (Signed)
Patient's BP was 88/46 automatic and 82/40 manually, MD notified and BP medications were held until MD come to unit to assess patient; will continue to monitor patient.  Lorretta Harp RN

## 2012-02-10 DIAGNOSIS — D649 Anemia, unspecified: Secondary | ICD-10-CM

## 2012-02-10 MED ORDER — FUROSEMIDE 40 MG PO TABS
40.0000 mg | ORAL_TABLET | Freq: Two times a day (BID) | ORAL | Status: DC
  Administered 2012-02-10 – 2012-02-11 (×2): 40 mg via ORAL
  Filled 2012-02-10 (×4): qty 1

## 2012-02-10 NOTE — Progress Notes (Signed)
I cosign for Aimee Sanders's assessment, med administration, notes, I/O, and care plan/education.  

## 2012-02-10 NOTE — Progress Notes (Addendum)
Pt BP 72/22 Auto, 82/38 manual. HR 81. Pt asymptomatic in NAD. Pt evening metoprolol held.  MD notified and new orders given. MD stated to notify if pt is symptomatic. Will continue to monitor and assess. Jobe Igo A RN

## 2012-02-10 NOTE — Progress Notes (Signed)
Patient ID: Aimee Sanders  female  ZOX:[id number]    DOB: 09-23-1945    DOA: 02/08/2012  PCP: Bennie Pierini, FNP  Subjective: BP somewhat low today, complaining of headache and not feeling well  Objective: Weight change: -1.224 kg (-2 lb 11.2 oz)  Intake/Output Summary (Last 24 hours) at 02/10/12 1720 Last data filed at 02/10/12 1300  Gross per 24 hour  Intake   2820 ml  Output   1376 ml  Net   1444 ml   Blood pressure 102/57, pulse 70, temperature 98.2 F (36.8 C), temperature source Oral, resp. rate 18, height 5\' 4"  (1.626 m), weight 59.376 kg (130 lb 14.4 oz), SpO2 96.00%.  Physical Exam: General: Alert and awake, oriented x3, not in any acute distress. HEENT: anicteric sclera, pupils reactive to light and accommodation, EOMI CVS: S1-S2 clear, no murmur rubs or gallops Chest: Decreased breath sound at the bases, no wheezing, rales or rhonchi Abdomen: soft nontender, nondistended, normal bowel sounds, no organomegaly Extremities: no cyanosis, clubbing or edema noted bilaterally   Lab Results: Basic Metabolic Panel:  Lab 02/09/12 8119 02/08/12 1121  NA 138 140  K 3.8 4.0  CL 96 101  CO2 31 35*  GLUCOSE 174* 100*  BUN 10 7  CREATININE 0.62 0.59  CALCIUM 9.3 9.1  MG -- --  PHOS -- --   Liver Function Tests:  Lab 02/08/12 1121  AST 25  ALT 14  ALKPHOS 49  BILITOT 0.2*  PROT 5.9*  ALBUMIN 2.6*   No results found for this basename: LIPASE:2,AMYLASE:2 in the last 168 hours No results found for this basename: AMMONIA:2 in the last 168 hours CBC:  Lab 02/09/12 0555 02/08/12 1121  WBC 7.2 8.3  NEUTROABS -- 4.5  HGB 10.6* 10.6*  HCT 32.0* 32.9*  MCV 89.6 92.2  PLT 347 315   Cardiac Enzymes:  Lab 02/08/12 1121  CKTOTAL --  CKMB --  CKMBINDEX --  TROPONINI <0.30   BNP: No components found with this basename: POCBNP:2 CBG:  Lab 02/09/12 0625  GLUCAP 225*     Micro Results: No results found for this or any previous visit (from the past 240  hour(s)).  Studies/Results: Ct Angio Chest Pe W/cm &/or Wo Cm  02/08/2012  *RADIOLOGY REPORT*  Clinical Data: Sudden onset shortness of breath.  Lower extremity edema.  CT ANGIOGRAPHY CHEST  Technique:  Multidetector CT imaging of the chest using the standard protocol during bolus administration of intravenous contrast. Multiplanar reconstructed images including MIPs were obtained and reviewed to evaluate the vascular anatomy.  Contrast: OMNIPAQUE IOHEXOL 350 MG/ML SOLN  Comparison: 11/24/2011.  Findings: Evaluation for subsegmental pulmonary emboli is somewhat limited by respiratory motion.  No central, lumbar or segmental pulmonary embolus.  No pathologically enlarged mediastinal, hilar or axillary lymph nodes.  Atherosclerotic calcification of the arterial vasculature, including coronary arteries.  Pulmonary arteries are borderline enlarged.  Heart is enlarged.  No pericardial effusion.  Again, respiratory motion degrades image quality upon review of the lungs.  Mild emphysematous changes.  Chronic-appearing volume loss adjacent to the left heart border, within the lingula.  Scarring or atelectasis in the lower lobes, left greater than right.  No pleural fluid.  Airway is unremarkable.  Incidental imaging of the upper abdomen shows an irregular liver margin.  Appear low attenuation in the upper pole right kidney may be artifactual, related to streak artifact from adjacent vertebroplasty, as there is no corresponding abnormality on examination of 11/24/2011 or 06/13/2010.  No worrisome  lytic or sclerotic lesions.  Vertebroplasties and old right rib fractures are noted.  C7 compression fracture appears new from 11/24/2011.  There is a new superior endplate compression fracture involving T4. L1 compression fracture is stable.  IMPRESSION:  1.  Evaluation for subsegmental pulmonary emboli is limited by respiratory motion.  No central, lobar or segmental pulmonary embolus. 2.  Mildly irregular liver margin  is indicative of cirrhosis. 3.  New C7 and T4 compression fractures.   Original Report Authenticated By: Reyes Ivan, M.D.    Dg Chest Portable 1 View  02/08/2012  *RADIOLOGY REPORT*  Clinical Data: Shortness of breath.  Prior history of MI, CHF, stroke, DVT.  Smoker with current history of COPD.  PORTABLE CHEST - 1 VIEW 02/08/2012 1128 hours:  Comparison: Two-view chest x-ray 12/31/2011  and 06/11/2010 [facility].  Portable chest x-ray 12/07/2011 [facility].  Findings: Cardiac silhouette moderately enlarged but stable. Thoracic aorta mildly tortuous and atherosclerotic, unchanged. Hilar and mediastinal contours otherwise unremarkable.  Pulmonary vascularity normal.  Scarring at the left lung base, unchanged.  No new pulmonary parenchymal abnormalities.  Prior augmentation of 3 mid and lower thoracic vertebrae.  IMPRESSION: Stable cardiomegaly.  Scarring at the left lung base.  No acute cardiopulmonary disease.   Original Report Authenticated By: Arnell Sieving, M.D.     Medications: Scheduled Meds:   . albuterol  2.5 mg Nebulization Q6H  . atorvastatin  20 mg Oral Daily  . buPROPion  150 mg Oral Daily  . citalopram  20 mg Oral Daily  . fenofibrate  160 mg Oral Daily  . folic acid  1 mg Oral Daily  . furosemide      . furosemide  80 mg Oral BID  . gabapentin  300 mg Oral TID  . montelukast  10 mg Oral QHS  . moxifloxacin  400 mg Oral Q2000  . nicotine  14 mg Transdermal Daily  . pantoprazole  40 mg Oral Daily  . potassium chloride  10 mEq Oral BID  . predniSONE  50 mg Oral Q breakfast  . Rivaroxaban  20 mg Oral Q supper  . sodium chloride  500 mL Intravenous Once  . sodium chloride  3 mL Intravenous Q12H  . tiotropium  18 mcg Inhalation Daily  . DISCONTD: isosorbide mononitrate  30 mg Oral Daily  . DISCONTD: lisinopril  20 mg Oral Daily  . DISCONTD: metoprolol tartrate  25 mg Oral BID   Continuous Infusions:     Assessment/Plan: Principal Problem:  *Respiratory failure, acute-on-chronic: Secondary to COPD with exacerbation, chronic systolic heart failure and obstructive sleep apnea with active nicotine abuse - Improving, close to baseline, patient is on 3 L O2 via nasal cannula. Continue current management.  Hypotension: - Decrease Lasix dose to 40 twice a day, hold all antihypertensives today.  Active Problems:  HYPERLIPIDEMIA Nicotine abuse: Patient was strongly counseled to quit smoking, continue nicotine   SLEEP APNEA   COPD with acute exacerbation:  - Continue oxygen, bronchodilator nebulizations, steroids, Avelox - Quit smoking   Anemia: stable   Systolic CHF, acute on chronic  History of DVT: Continue xarelto  DVT Prophylaxis:  Code Status: Full  Disposition:DC in AM   LOS: 2 days   Zayveon Raschke M.D. [facility] 02/10/2012, 5:20 PM Pager: [phone]  If 7PM-7AM, please contact night-coverage [email or web address] Password [email or web address]

## 2012-02-10 NOTE — Progress Notes (Signed)
SATURATION QUALIFICATIONS:  Patient Saturations on Room Air at Rest = 93%  Patient Saturations on Room Air while Ambulating = 85%  Patient Saturations on 3 Liters of oxygen while Ambulating = 93%  Statement of medical necessity for home oxygen: [employer] [facility] [phone] (pager)

## 2012-02-10 NOTE — Evaluation (Signed)
Physical Therapy Evaluation Patient Details Name: Aimee Sanders MRN: [id number] DOB: September 18, 1945 Today's Date: 02/10/2012 Time: 4098-1191 PT Time Calculation (min): 25 min  PT Assessment / Plan / Recommendation Clinical Impression  Patient s/p respiratory failure with decr mobility secondary to decr endurance and decr balance.  Will benefit from PT in hospital.  Pt. desat without O2 and aware she needs to use O2 at all times at homes.  Declines HHPT f/u but does want a 3N1.      PT Assessment  Patient needs continued PT services    Follow Up Recommendations  No PT follow up;Supervision/Assistance - 24 hour    Does the patient have the potential to tolerate intense rehabilitation      Barriers to Discharge        Equipment Recommendations  3 in 1 bedside comode    Recommendations for Other Services     Frequency Min 3X/week    Precautions / Restrictions Precautions Precautions: Fall Restrictions Weight Bearing Restrictions: No   Pertinent Vitals/Pain Desat to 85% without O2; No pain      Mobility  Bed Mobility Bed Mobility: Supine to Sit Supine to Sit: 5: Supervision Details for Bed Mobility Assistance: cues for technique Transfers Transfers: Sit to Stand;Stand to Sit Sit to Stand: With upper extremity assist;4: Min assist;From bed Stand to Sit: 4: Min assist;With upper extremity assist;With armrests;To chair/3-in-1 Details for Transfer Assistance: cues for hand placement Ambulation/Gait Ambulation/Gait Assistance: 4: Min assist Ambulation Distance (Feet): 125 Feet Assistive device: Rolling walker Ambulation/Gait Assistance Details: Pt. needed cues to stay close to RW and sequence steps.   Gait Pattern: Step-through pattern;Decreased stride length;Antalgic Gait velocity: decreased Stairs: No Wheelchair Mobility Wheelchair Mobility: No    Shoulder Instructions     Exercises General Exercises - Lower Extremity Long Arc Quad: AROM;Both;5 reps;Seated Hip  Flexion/Marching: AROM;Both;5 reps;Seated   PT Diagnosis: Generalized weakness  PT Problem List: Decreased activity tolerance;Decreased balance;Decreased mobility;Decreased safety awareness;Decreased knowledge of use of DME PT Treatment Interventions: DME instruction;Gait training;Functional mobility training;Therapeutic activities;Therapeutic exercise;Balance training;Patient/family education;Stair training   PT Goals Acute Rehab PT Goals PT Goal Formulation: With patient Time For Goal Achievement: 02/17/12 Potential to Achieve Goals: Good Pt will go Supine/Side to Sit: Independently PT Goal: Supine/Side to Sit - Progress: Goal set today Pt will go Sit to Stand: Independently PT Goal: Sit to Stand - Progress: Goal set today Pt will Ambulate: with modified independence;with least restrictive assistive device;51 - 150 feet PT Goal: Ambulate - Progress: Goal set today Pt will Go Up / Down Stairs: 6-9 stairs;with supervision;with least restrictive assistive device PT Goal: Up/Down Stairs - Progress: Goal set today  Visit Information  Last PT Received On: 02/10/12 Assistance Needed: +1    Subjective Data  Subjective: "I feel so tired." Patient Stated Goal: To quit smoking   Prior Functioning  Home Living Lives With: Spouse;Daughter;Son Available Help at Discharge: Family;Available 24 hours/day Type of Home: Mobile home Home Access: Stairs to enter Entrance Stairs-Number of Steps: 7 Entrance Stairs-Rails: Can reach both Home Layout: One level Bathroom Shower/Tub: Engineer, manufacturing systems: Standard (vanity beside) Home Adaptive Equipment: Tub transfer bench;Walker - rolling;Wheelchair - manual;Hand-held shower hose;Grab bars in shower;Hospital bed;Quad cane Prior Function Level of Independence: Independent with assistive device(s) (ambulated with quad cane most times per pt) Able to Take Stairs?: Yes Driving: No Vocation: Retired Musician: No  difficulties Dominant Hand: Right    Cognition  Overall Cognitive Status: Appears within functional limits for tasks assessed/performed Arousal/Alertness:  Awake/alert Orientation Level: Appears intact for tasks assessed Behavior During Session: [facility] for tasks performed    Extremity/Trunk Assessment Right Lower Extremity Assessment RLE ROM/Strength/Tone: [facility] for tasks assessed Left Lower Extremity Assessment LLE ROM/Strength/Tone: [facility] for tasks assessed   Balance Static Standing Balance Static Standing - Balance Support: Bilateral upper extremity supported;During functional activity Static Standing - Level of Assistance: 5: Stand by assistance Static Standing - Comment/# of Minutes: 2 minutes  End of Session PT - End of Session Equipment Utilized During Treatment: Gait belt;Oxygen Activity Tolerance: Patient limited by fatigue Patient left: in chair;with call bell/phone within reach Nurse Communication: Mobility status       INGOLD,Jarryn Altland 02/10/2012, 12:44 PM  [facility] [phone] (pager)

## 2012-02-11 DIAGNOSIS — I80299 Phlebitis and thrombophlebitis of other deep vessels of unspecified lower extremity: Secondary | ICD-10-CM

## 2012-02-11 LAB — GLUCOSE, CAPILLARY: Glucose-Capillary: 76 mg/dL (ref 70–99)

## 2012-02-11 MED ORDER — IPRATROPIUM-ALBUTEROL 0.5-2.5 (3) MG/3ML IN SOLN: 3.0000 mL | Freq: Three times a day (TID) | RESPIRATORY_TRACT | Status: DC

## 2012-02-11 MED ORDER — OXYCODONE-ACETAMINOPHEN 10-325 MG PO TABS: 1.0000 | ORAL_TABLET | Freq: Four times a day (QID) | ORAL | Status: DC | PRN

## 2012-02-11 MED ORDER — POTASSIUM CHLORIDE CRYS ER 10 MEQ PO TBCR: 10.0000 meq | EXTENDED_RELEASE_TABLET | Freq: Every day | ORAL | Status: DC

## 2012-02-11 MED ORDER — FLUTICASONE-SALMETEROL 100-50 MCG/DOSE IN AEPB: 1.0000 | INHALATION_SPRAY | Freq: Two times a day (BID) | RESPIRATORY_TRACT | Status: DC

## 2012-02-11 MED ORDER — MOXIFLOXACIN HCL 400 MG PO TABS: 400.0000 mg | ORAL_TABLET | Freq: Every day | ORAL | Status: DC

## 2012-02-11 MED ORDER — FUROSEMIDE 80 MG PO TABS: 40.0000 mg | ORAL_TABLET | Freq: Every day | ORAL | Status: DC

## 2012-02-11 MED ORDER — NICOTINE 14 MG/24HR TD PT24: 1.0000 | MEDICATED_PATCH | Freq: Every day | TRANSDERMAL | Status: DC

## 2012-02-11 MED ORDER — SODIUM CHLORIDE 0.9 % IV BOLUS (SEPSIS)
250.0000 mL | Freq: Once | INTRAVENOUS | Status: AC
  Administered 2012-02-11: 250 mL via INTRAVENOUS

## 2012-02-11 MED ORDER — PREDNISONE (PAK) 10 MG PO TABS: ORAL_TABLET | ORAL | Status: DC

## 2012-02-11 NOTE — Progress Notes (Signed)
   PT Cancellation Note  Patient Details Name: TONEKA FULLEN MRN: [id number] DOB: 03/23/1946   Cancelled Treatment:    Reason Eval/Treat Not Completed:  (Pt. refused)   INGOLD,Linzie Criss 02/11/2012, 9:46 AM [facility] [phone] (pager)

## 2012-02-11 NOTE — Progress Notes (Signed)
02/11/12, Kathi Der RNC-MNN, BSN, [phone], CM received referral from Dr. Isidoro Donning for [facility] RN and DME.  Marie at [facility] contacted with [facility] order and Darian at [facility] contacted with DME order.

## 2012-02-11 NOTE — Progress Notes (Signed)
Patient with [facility].  Patient asymptomatic and in no acute distress. MD aware. No new orders placed. RN will continue to monitor. Louretta Parma, RN

## 2012-02-11 NOTE — Progress Notes (Signed)
Verbalized understanding of discharge instructions r/t stop smoking.  Discussed in detail the affects of smoking on lungs and heart. Client instructed not to take blood pressure meds until she is seen by her healthcare provider on 02/22/2012 @ 10:30.  She is to call for an appointment with cardiology.  Left unit  Via wheelchair, home oxygen in car for transport via family member.  O2 saturation 94% on 3l/min.  Alert and oriented, in no acute distress.   Thanks, [employer]

## 2012-02-11 NOTE — Progress Notes (Signed)
[facility]  Patient Status: Active (receiving services up to time of hospitalization)  [facility] is providing the following services: RN  If patient discharges after hours, please call [phone].   Wynelle Bourgeois 02/11/2012, 3:04 PM

## 2012-02-11 NOTE — Discharge Summary (Signed)
Physician Discharge Summary  Patient ID: Aimee Sanders MRN: [id number] DOB/AGE: [birth year] 66 y.o.  Admit date: 02/08/2012 Discharge date: 02/11/2012  Primary Care Physician:  Bennie Pierini, FNP  Discharge Diagnoses:    .Respiratory failure, acute and chronic .COPD with acute exacerbation . active nicotine abuse  . hypotension  .SLEEP APNEA .HYPERLIPIDEMIA . history of DVT .Anemia .Systolic CHF, acute on chronic  Consults:  None  Discharge Medications:   Medication List     As of 02/11/2012  2:37 PM    STOP taking these medications         isosorbide mononitrate 30 MG 24 hr tablet   Commonly known as: IMDUR      lisinopril 20 MG tablet   Commonly known as: PRINIVIL,ZESTRIL      metoprolol tartrate 25 MG tablet   Commonly known as: LOPRESSOR      TAKE these medications         atorvastatin 20 MG tablet   Commonly known as: LIPITOR   Take 20 mg by mouth daily.      buPROPion 150 MG 24 hr tablet   Commonly known as: WELLBUTRIN XL   Take 150 mg by mouth daily.      citalopram 20 MG tablet   Commonly known as: CELEXA   Take 20 mg by mouth daily.      clonazePAM 0.5 MG tablet   Commonly known as: KLONOPIN   Take 1 mg by mouth 3 (three) times daily as needed. For nervousness      cyclobenzaprine 5 MG tablet   Commonly known as: FLEXERIL   Take 5 mg by mouth daily as needed.      fenofibrate 160 MG tablet   Take 160 mg by mouth daily.      Fluticasone-Salmeterol 100-50 MCG/DOSE Aepb   Commonly known as: ADVAIR   Inhale 1 puff into the lungs every 12 (twelve) hours.      folic acid 1 MG tablet   Commonly known as: FOLVITE   Take 1 mg by mouth daily.      furosemide 80 MG tablet   Commonly known as: LASIX   Take 0.5 tablets (40 mg total) by mouth daily.      gabapentin 300 MG capsule   Commonly known as: NEURONTIN   Take 300 mg by mouth 3 (three) times daily.      ipratropium-albuterol 0.5-2.5 (3) MG/3ML Soln   Commonly known as: DUONEB    Take 3 mLs by nebulization 3 (three) times daily. For shortness of breath or wheezing      montelukast 10 MG tablet   Commonly known as: SINGULAIR   Take 10 mg by mouth at bedtime.      moxifloxacin 400 MG tablet   Commonly known as: AVELOX   Take 1 tablet (400 mg total) by mouth daily at 8 pm. X 5 DAYS      nicotine 14 mg/24hr patch   Commonly known as: NICODERM CQ - dosed in mg/24 hours   Place 1 patch onto the skin daily.      nitroGLYCERIN 0.4 MG SL tablet   Commonly known as: NITROSTAT   Place 0.4 mg under the tongue every 5 (five) minutes as needed. For chest pain      omeprazole 20 MG capsule   Commonly known as: PRILOSEC   Take 20 mg by mouth.      oxyCODONE-acetaminophen 10-325 MG per tablet   Commonly known as: PERCOCET   Take 1 tablet  by mouth every 6 (six) hours as needed for pain. For pain      potassium chloride 10 MEQ tablet   Commonly known as: K-DUR,KLOR-CON   Take 1 tablet (10 mEq total) by mouth daily.      predniSONE 10 MG tablet   Commonly known as: STERAPRED UNI-PAK   Prednisone dosing: Take  Prednisone 40mg  (4 tabs) x 3 days, then taper to 30mg  (3 tabs) x 3 days, then 20mg  (2 tabs) x 3days, then 10mg  (1 tab) x 3days, then OFF.      Dispense:  30 tabs, refills: None      promethazine 25 MG tablet   Commonly known as: PHENERGAN   Take 0.5-1 tablets (12.5-25 mg total) by mouth every 6 (six) hours as needed for nausea. For nausea      Rivaroxaban 20 MG Tabs   Commonly known as: XARELTO   Take 20 mg by mouth daily.      tiotropium 18 MCG inhalation capsule   Commonly known as: SPIRIVA   Place 18 mcg into inhaler and inhale daily.      traMADol 50 MG tablet   Commonly known as: ULTRAM   Take 50 mg by mouth 2 (two) times daily as needed. For pain      Vitamin D (Ergocalciferol) 50000 UNITS Caps   Commonly known as: DRISDOL   Take 50,000 Units by mouth every 7 (seven) days. On Mondays         Brief H and P: For complete details please  refer to admission H and P, but in brief Aimee Sanders is a 66 y.o. female with history of O2 dependent (3 L/min) chronic respiratory failure, ongoing tobacco abuse, COPD, chronic systolic congestive heart failure (EF 40-45%) and, DVT on Xarelto, coronary artery disease, chronic pain, sleep apnea presented to the emergency department with 3 days history of progressively worsening dyspnea. For last 1 week patient had noted worsening dyspnea, wheezing, tightening the chest, new cough productive of light yellow/green sputum. She had some chest pains only on coughing. She used her nebulizations with some relief. She indicatedthat she's been compliant with her Lasix but not fluid restriction. She claimed she has gained 5 pounds of weight recently.Marland Kitchen She was brought by EMS on CPAP. Per EMS patient's oxygen saturations were in the 70s on home oxygen of 3 L per minute. In the ED patient has received IV Solu-Medrol, bronchodilator nebulizations, a dose of Avelox and Lasix.   Hospital Course:  Patient is a 66 year old female with severe COPD, oxygen dependent, history of systolic CHF was admitted with acute on chronic respiratory failure. Respiratory failure, acute-on-chronic: Secondary to COPD with exacerbation, chronic systolic heart failure and obstructive sleep apnea with active nicotine abuse. This has significantly improved, patient is close to baseline, patient is on 3 L O2 via nasal cannula. She was recommended to continue nebulizers 3 times a day, Advair, Avelox, steroids and oxygen. Patient was strongly counseled to quit smoking.  Hypotension: The patient was initially volume overloaded hence was on Lasix including all the other cardiac medications. Currently CHF is compensated, patient was recommended to hold the antihypertensives and Lasix until she followup with her PCP. Patient was strongly counseled to be compliant with her medications and her MDr followups.  Nicotine abuse: Patient was strongly  counseled to quit smoking, continue nicotine patch  SLEEP APNEA   COPD with acute exacerbation: As #1, patient was recommended to continue oxygen, bronchodilator nebulizations, steroids, Avelox.   History of DVT:  Continue xarelto   Day of Discharge BP 100/52  Pulse 90  Temp 98.2 F (36.8 C) (Oral)  Resp 19  Ht 5\' 4"  (1.626 m)  Wt 58.741 kg (129 lb 8 oz)  BMI 22.23 kg/m2  SpO2 97%  Physical Exam: General: Alert and awake oriented x3 not in any acute distress. HEENT: anicteric sclera, pupils reactive to light and accommodation CVS: S1-S2 clear no murmur rubs or gallops Chest: clear to auscultation bilaterally, no wheezing rales or rhonchi Abdomen: soft nontender, nondistended, normal bowel sounds, no organomegaly Extremities: no cyanosis, clubbing or edema noted bilaterally   The results of significant diagnostics from this hospitalization (including imaging, microbiology, ancillary and laboratory) are listed below for reference.    LAB RESULTS: Basic Metabolic Panel:  Lab 02/09/12 0272 02/08/12 1121  NA 138 140  K 3.8 4.0  CL 96 101  CO2 31 35*  GLUCOSE 174* 100*  BUN 10 7  CREATININE 0.62 0.59  CALCIUM 9.3 9.1  MG -- --  PHOS -- --   Liver Function Tests:  Lab 02/08/12 1121  AST 25  ALT 14  ALKPHOS 49  BILITOT 0.2*  PROT 5.9*  ALBUMIN 2.6*   No results found for this basename: LIPASE:2,AMYLASE:2 in the last 168 hours No results found for this basename: AMMONIA:2 in the last 168 hours CBC:  Lab 02/09/12 0555 02/08/12 1121  WBC 7.2 8.3  NEUTROABS -- 4.5  HGB 10.6* 10.6*  HCT 32.0* 32.9*  MCV 89.6 --  PLT 347 315   Cardiac Enzymes:  Lab 02/08/12 1121  CKTOTAL --  CKMB --  CKMBINDEX --  TROPONINI <0.30   BNP: No components found with this basename: POCBNP:2 CBG:  Lab 02/11/12 0626 02/09/12 0625  GLUCAP 76 225*    Significant Diagnostic Studies:  Ct Angio Chest Pe W/cm &/or Wo Cm  02/08/2012  *RADIOLOGY REPORT*  Clinical Data: Sudden  onset shortness of breath.  Lower extremity edema.  CT ANGIOGRAPHY CHEST  Technique:  Multidetector CT imaging of the chest using the standard protocol during bolus administration of intravenous contrast. Multiplanar reconstructed images including MIPs were obtained and reviewed to evaluate the vascular anatomy.  Contrast: OMNIPAQUE IOHEXOL 350 MG/ML SOLN  Comparison: 11/24/2011.  Findings: Evaluation for subsegmental pulmonary emboli is somewhat limited by respiratory motion.  No central, lumbar or segmental pulmonary embolus.  No pathologically enlarged mediastinal, hilar or axillary lymph nodes.  Atherosclerotic calcification of the arterial vasculature, including coronary arteries.  Pulmonary arteries are borderline enlarged.  Heart is enlarged.  No pericardial effusion.  Again, respiratory motion degrades image quality upon review of the lungs.  Mild emphysematous changes.  Chronic-appearing volume loss adjacent to the left heart border, within the lingula.  Scarring or atelectasis in the lower lobes, left greater than right.  No pleural fluid.  Airway is unremarkable.  Incidental imaging of the upper abdomen shows an irregular liver margin.  Appear low attenuation in the upper pole right kidney may be artifactual, related to streak artifact from adjacent vertebroplasty, as there is no corresponding abnormality on examination of 11/24/2011 or 06/13/2010.  No worrisome lytic or sclerotic lesions.  Vertebroplasties and old right rib fractures are noted.  C7 compression fracture appears new from 11/24/2011.  There is a new superior endplate compression fracture involving T4. L1 compression fracture is stable.  IMPRESSION:  1.  Evaluation for subsegmental pulmonary emboli is limited by respiratory motion.  No central, lobar or segmental pulmonary embolus. 2.  Mildly irregular liver margin is  indicative of cirrhosis. 3.  New C7 and T4 compression fractures.   Original Report Authenticated By: Reyes Ivan, M.D.    Dg Chest Portable 1 View  02/08/2012  *RADIOLOGY REPORT*  Clinical Data: Shortness of breath.  Prior history of MI, CHF, stroke, DVT.  Smoker with current history of COPD.  PORTABLE CHEST - 1 VIEW 02/08/2012 1128 hours:  Comparison: Two-view chest x-ray 12/31/2011 [facility] and 06/11/2010 [facility].  Portable chest x-ray 12/07/2011 [facility].  Findings: Cardiac silhouette moderately enlarged but stable. Thoracic aorta mildly tortuous and atherosclerotic, unchanged. Hilar and mediastinal contours otherwise unremarkable.  Pulmonary vascularity normal.  Scarring at the left lung base, unchanged.  No new pulmonary parenchymal abnormalities.  Prior augmentation of 3 mid and lower thoracic vertebrae.  IMPRESSION: Stable cardiomegaly.  Scarring at the left lung base.  No acute cardiopulmonary disease.   Original Report Authenticated By: Arnell Sieving, M.D.      Disposition and Follow-up:     Discharge Orders    Future Orders Please Complete By Expires   Diet - low sodium heart healthy      Increase activity slowly      (HEART FAILURE PATIENTS) Call MD:  Anytime you have any of the following symptoms: 1) 3 pound weight gain in 24 hours or 5 pounds in 1 week 2) shortness of breath, with or without a dry hacking cough 3) swelling in the hands, feet or stomach 4) if you have to sleep on extra pillows at night in order to breathe.      Discharge instructions      Comments:   Please follow-up with your cardiologist, Dr Andee Lineman Ulyess Mort) asap. Hold all BP meds, lasix until you follow-up with your primary doctor in 1 week. Lasix dose is decresed to 40mg  daily. Stop smoking.       DISPOSITION: Home  DIET: Heart healthy diet, 2 g sodium ACTIVITY: As tolerated   DISCHARGE FOLLOW-UP Follow-up Information    Follow up with Bennie Pierini, FNP. Schedule an appointment as soon as possible for a visit on 02/22/2012. (for hospital follow-up. HOLD  ALL YOUR BP MEDS UNTIL FOLLOW-UP  Appointment at 10:30am on 02/22/12)    Contact information:   [address] [place] [address] [phone]       Follow up with Peyton Bottoms, MD. Schedule an appointment as soon as possible for a visit in 10 days. (Dr. Andee Lineman office will send letter to make appointment reminder.  If you don't hear anything, please call office.  )    Contact information:   [facility] [address] [place], [place] [address] [phone]       Follow up with [facility]. [facility] Nurse)    Contact information:   [phone]         Time spent on Discharge: 40 minutes  Signed:   Tramane Gorum M.D. [facility] 02/11/2012, 2:37 PM Pager: [phone]

## 2012-03-01 ENCOUNTER — Inpatient Hospital Stay (HOSPITAL_COMMUNITY)
Admission: EM | Admit: 2012-03-01 | Discharge: 2012-03-04 | DRG: 190 | Disposition: A | Discharge status: Home Health | Payer: PRIVATE HEALTH INSURANCE | Attending: Internal Medicine | Admitting: Internal Medicine

## 2012-03-01 ENCOUNTER — Emergency Department (HOSPITAL_COMMUNITY): Payer: PRIVATE HEALTH INSURANCE

## 2012-03-01 ENCOUNTER — Encounter (HOSPITAL_COMMUNITY): Payer: Self-pay

## 2012-03-01 DIAGNOSIS — IMO0002 Reserved for concepts with insufficient information to code with codable children: Secondary | ICD-10-CM | POA: Diagnosis present

## 2012-03-01 DIAGNOSIS — M25559 Pain in unspecified hip: Secondary | ICD-10-CM | POA: Diagnosis present

## 2012-03-01 DIAGNOSIS — R6251 Failure to thrive (child): Secondary | ICD-10-CM

## 2012-03-01 DIAGNOSIS — I5032 Chronic diastolic (congestive) heart failure: Secondary | ICD-10-CM

## 2012-03-01 DIAGNOSIS — J449 Chronic obstructive pulmonary disease, unspecified: Secondary | ICD-10-CM

## 2012-03-01 DIAGNOSIS — F17200 Nicotine dependence, unspecified, uncomplicated: Secondary | ICD-10-CM

## 2012-03-01 DIAGNOSIS — G8929 Other chronic pain: Secondary | ICD-10-CM | POA: Diagnosis present

## 2012-03-01 DIAGNOSIS — Z9089 Acquired absence of other organs: Secondary | ICD-10-CM

## 2012-03-01 DIAGNOSIS — I509 Heart failure, unspecified: Secondary | ICD-10-CM | POA: Diagnosis present

## 2012-03-01 DIAGNOSIS — J9610 Chronic respiratory failure, unspecified whether with hypoxia or hypercapnia: Secondary | ICD-10-CM

## 2012-03-01 DIAGNOSIS — D649 Anemia, unspecified: Secondary | ICD-10-CM

## 2012-03-01 DIAGNOSIS — G4730 Sleep apnea, unspecified: Secondary | ICD-10-CM

## 2012-03-01 DIAGNOSIS — J4489 Other specified chronic obstructive pulmonary disease: Secondary | ICD-10-CM

## 2012-03-01 DIAGNOSIS — I219 Acute myocardial infarction, unspecified: Secondary | ICD-10-CM

## 2012-03-01 DIAGNOSIS — I3139 Other pericardial effusion (noninflammatory): Secondary | ICD-10-CM

## 2012-03-01 DIAGNOSIS — Z880 Allergy status to penicillin: Secondary | ICD-10-CM

## 2012-03-01 DIAGNOSIS — I5042 Chronic combined systolic (congestive) and diastolic (congestive) heart failure: Secondary | ICD-10-CM | POA: Diagnosis present

## 2012-03-01 DIAGNOSIS — Z886 Allergy status to analgesic agent status: Secondary | ICD-10-CM

## 2012-03-01 DIAGNOSIS — I2510 Atherosclerotic heart disease of native coronary artery without angina pectoris: Secondary | ICD-10-CM | POA: Diagnosis present

## 2012-03-01 DIAGNOSIS — I80299 Phlebitis and thrombophlebitis of other deep vessels of unspecified lower extremity: Secondary | ICD-10-CM

## 2012-03-01 DIAGNOSIS — D689 Coagulation defect, unspecified: Secondary | ICD-10-CM

## 2012-03-01 DIAGNOSIS — I739 Peripheral vascular disease, unspecified: Secondary | ICD-10-CM

## 2012-03-01 DIAGNOSIS — Z7901 Long term (current) use of anticoagulants: Secondary | ICD-10-CM

## 2012-03-01 DIAGNOSIS — R911 Solitary pulmonary nodule: Secondary | ICD-10-CM

## 2012-03-01 DIAGNOSIS — I313 Pericardial effusion (noninflammatory): Secondary | ICD-10-CM

## 2012-03-01 DIAGNOSIS — I6789 Other cerebrovascular disease: Secondary | ICD-10-CM

## 2012-03-01 DIAGNOSIS — Z79899 Other long term (current) drug therapy: Secondary | ICD-10-CM

## 2012-03-01 DIAGNOSIS — J441 Chronic obstructive pulmonary disease with (acute) exacerbation: Secondary | ICD-10-CM

## 2012-03-01 DIAGNOSIS — Z8673 Personal history of transient ischemic attack (TIA), and cerebral infarction without residual deficits: Secondary | ICD-10-CM

## 2012-03-01 DIAGNOSIS — F411 Generalized anxiety disorder: Secondary | ICD-10-CM | POA: Diagnosis present

## 2012-03-01 DIAGNOSIS — J9620 Acute and chronic respiratory failure, unspecified whether with hypoxia or hypercapnia: Secondary | ICD-10-CM

## 2012-03-01 DIAGNOSIS — Z9981 Dependence on supplemental oxygen: Secondary | ICD-10-CM

## 2012-03-01 DIAGNOSIS — I10 Essential (primary) hypertension: Secondary | ICD-10-CM | POA: Diagnosis present

## 2012-03-01 DIAGNOSIS — E785 Hyperlipidemia, unspecified: Secondary | ICD-10-CM

## 2012-03-01 DIAGNOSIS — I252 Old myocardial infarction: Secondary | ICD-10-CM

## 2012-03-01 DIAGNOSIS — I5023 Acute on chronic systolic (congestive) heart failure: Secondary | ICD-10-CM

## 2012-03-01 DIAGNOSIS — R51 Headache: Secondary | ICD-10-CM | POA: Diagnosis present

## 2012-03-01 DIAGNOSIS — Z86718 Personal history of other venous thrombosis and embolism: Secondary | ICD-10-CM

## 2012-03-01 LAB — CBC WITH DIFFERENTIAL/PLATELET
Basophils Absolute: 0 10*3/uL (ref 0.0–0.1)
Basophils Relative: 0 % (ref 0–1)
Eosinophils Absolute: 0.1 10*3/uL (ref 0.0–0.7)
Hemoglobin: 10.9 g/dL — ABNORMAL LOW (ref 12.0–15.0)
MCH: 29.5 pg (ref 26.0–34.0)
MCHC: 32.2 g/dL (ref 30.0–36.0)
Monocytes Relative: 15 % — ABNORMAL HIGH (ref 3–12)
Neutrophils Relative %: 44 % (ref 43–77)
RDW: 15.7 % — ABNORMAL HIGH (ref 11.5–15.5)

## 2012-03-01 LAB — POCT I-STAT, CHEM 8
Calcium, Ion: 1.22 mmol/L (ref 1.13–1.30)
Glucose, Bld: 73 mg/dL (ref 70–99)
HCT: 34 % — ABNORMAL LOW (ref 36.0–46.0)
Hemoglobin: 11.6 g/dL — ABNORMAL LOW (ref 12.0–15.0)
Potassium: 3.4 mEq/L — ABNORMAL LOW (ref 3.5–5.1)

## 2012-03-01 MED ORDER — ALBUTEROL SULFATE (5 MG/ML) 0.5% IN NEBU
2.5000 mg | INHALATION_SOLUTION | RESPIRATORY_TRACT | Status: DC
  Administered 2012-03-01: 2.5 mg via RESPIRATORY_TRACT
  Filled 2012-03-01: qty 0.5

## 2012-03-01 MED ORDER — GUAIFENESIN-DM 100-10 MG/5ML PO SYRP
5.0000 mL | ORAL_SOLUTION | ORAL | Status: DC | PRN
  Administered 2012-03-01: 5 mL via ORAL
  Filled 2012-03-01: qty 5

## 2012-03-01 MED ORDER — IPRATROPIUM BROMIDE 0.02 % IN SOLN
0.5000 mg | Freq: Four times a day (QID) | RESPIRATORY_TRACT | Status: DC
  Administered 2012-03-02 (×2): 0.5 mg via RESPIRATORY_TRACT
  Filled 2012-03-01 (×2): qty 2.5

## 2012-03-01 MED ORDER — PROMETHAZINE HCL 25 MG PO TABS
12.5000 mg | ORAL_TABLET | Freq: Four times a day (QID) | ORAL | Status: DC | PRN
  Administered 2012-03-04: 25 mg via ORAL
  Filled 2012-03-01: qty 1

## 2012-03-01 MED ORDER — METHYLPREDNISOLONE SODIUM SUCC 125 MG IJ SOLR
60.0000 mg | Freq: Four times a day (QID) | INTRAMUSCULAR | Status: DC
  Administered 2012-03-01 – 2012-03-04 (×11): 60 mg via INTRAVENOUS
  Filled 2012-03-01 (×16): qty 0.96

## 2012-03-01 MED ORDER — IPRATROPIUM BROMIDE 0.02 % IN SOLN
RESPIRATORY_TRACT | Status: AC
  Administered 2012-03-01: 0.5 mg via RESPIRATORY_TRACT
  Filled 2012-03-01: qty 2.5

## 2012-03-01 MED ORDER — ISOSORBIDE MONONITRATE ER 30 MG PO TB24
30.0000 mg | ORAL_TABLET | Freq: Every day | ORAL | Status: DC
  Administered 2012-03-02 – 2012-03-04 (×3): 30 mg via ORAL
  Filled 2012-03-01 (×3): qty 1

## 2012-03-01 MED ORDER — IPRATROPIUM BROMIDE 0.02 % IN SOLN
0.5000 mg | RESPIRATORY_TRACT | Status: DC
  Administered 2012-03-01: 0.5 mg via RESPIRATORY_TRACT
  Filled 2012-03-01: qty 2.5

## 2012-03-01 MED ORDER — FOLIC ACID 1 MG PO TABS
1.0000 mg | ORAL_TABLET | Freq: Every day | ORAL | Status: DC
  Administered 2012-03-02 – 2012-03-04 (×3): 1 mg via ORAL
  Filled 2012-03-01 (×3): qty 1

## 2012-03-01 MED ORDER — CITALOPRAM HYDROBROMIDE 20 MG PO TABS
20.0000 mg | ORAL_TABLET | Freq: Every day | ORAL | Status: DC
  Administered 2012-03-02 – 2012-03-04 (×3): 20 mg via ORAL
  Filled 2012-03-01 (×3): qty 1

## 2012-03-01 MED ORDER — FUROSEMIDE 40 MG PO TABS
40.0000 mg | ORAL_TABLET | Freq: Every day | ORAL | Status: DC
  Administered 2012-03-02 – 2012-03-04 (×3): 40 mg via ORAL
  Filled 2012-03-01 (×3): qty 1

## 2012-03-01 MED ORDER — ATORVASTATIN CALCIUM 40 MG PO TABS
40.0000 mg | ORAL_TABLET | Freq: Every day | ORAL | Status: DC
  Administered 2012-03-01 – 2012-03-03 (×3): 40 mg via ORAL
  Filled 2012-03-01 (×4): qty 1

## 2012-03-01 MED ORDER — OXYCODONE-ACETAMINOPHEN 10-325 MG PO TABS: 1.0000 | ORAL_TABLET | Freq: Four times a day (QID) | ORAL | Status: DC | PRN

## 2012-03-01 MED ORDER — ALBUTEROL SULFATE (5 MG/ML) 0.5% IN NEBU
2.5000 mg | INHALATION_SOLUTION | Freq: Four times a day (QID) | RESPIRATORY_TRACT | Status: DC
  Administered 2012-03-02 (×2): 2.5 mg via RESPIRATORY_TRACT
  Filled 2012-03-01 (×2): qty 0.5

## 2012-03-01 MED ORDER — ONDANSETRON HCL 4 MG/2ML IJ SOLN: 4.0000 mg | Freq: Three times a day (TID) | INTRAMUSCULAR | Status: DC | PRN

## 2012-03-01 MED ORDER — SODIUM CHLORIDE 0.9 % IJ SOLN
3.0000 mL | INTRAMUSCULAR | Status: DC | PRN
  Administered 2012-03-01 – 2012-03-02 (×2): 3 mL via INTRAVENOUS

## 2012-03-01 MED ORDER — OXYCODONE HCL 5 MG PO TABS
5.0000 mg | ORAL_TABLET | Freq: Four times a day (QID) | ORAL | Status: DC | PRN
  Administered 2012-03-01 – 2012-03-04 (×7): 5 mg via ORAL
  Filled 2012-03-01 (×7): qty 1

## 2012-03-01 MED ORDER — PREDNISONE 20 MG PO TABS
60.0000 mg | ORAL_TABLET | Freq: Once | ORAL | Status: AC
  Administered 2012-03-01: 60 mg via ORAL
  Filled 2012-03-01: qty 3

## 2012-03-01 MED ORDER — ALBUTEROL SULFATE (5 MG/ML) 0.5% IN NEBU: 2.5000 mg | INHALATION_SOLUTION | Freq: Four times a day (QID) | RESPIRATORY_TRACT | Status: DC

## 2012-03-01 MED ORDER — FENOFIBRATE 160 MG PO TABS
160.0000 mg | ORAL_TABLET | Freq: Every day | ORAL | Status: DC
  Administered 2012-03-02 – 2012-03-04 (×3): 160 mg via ORAL
  Filled 2012-03-01 (×3): qty 1

## 2012-03-01 MED ORDER — IPRATROPIUM BROMIDE 0.02 % IN SOLN: 0.5000 mg | RESPIRATORY_TRACT | Status: DC

## 2012-03-01 MED ORDER — OXYCODONE-ACETAMINOPHEN 5-325 MG PO TABS
1.0000 | ORAL_TABLET | Freq: Four times a day (QID) | ORAL | Status: DC | PRN
  Administered 2012-03-01 – 2012-03-04 (×7): 1 via ORAL
  Filled 2012-03-01 (×7): qty 1

## 2012-03-01 MED ORDER — RIVAROXABAN 20 MG PO TABS
20.0000 mg | ORAL_TABLET | Freq: Every day | ORAL | Status: DC
  Administered 2012-03-01 – 2012-03-04 (×4): 20 mg via ORAL
  Filled 2012-03-01 (×4): qty 1

## 2012-03-01 MED ORDER — SODIUM CHLORIDE 0.9 % IJ SOLN
3.0000 mL | Freq: Two times a day (BID) | INTRAMUSCULAR | Status: DC
  Administered 2012-03-01 – 2012-03-02 (×2): 3 mL via INTRAVENOUS
  Administered 2012-03-03: 5 mL via INTRAVENOUS
  Administered 2012-03-03 – 2012-03-04 (×3): 3 mL via INTRAVENOUS

## 2012-03-01 MED ORDER — CLONAZEPAM 0.5 MG PO TABS
1.0000 mg | ORAL_TABLET | Freq: Three times a day (TID) | ORAL | Status: DC | PRN
  Administered 2012-03-03 (×2): 1 mg via ORAL
  Filled 2012-03-01 (×2): qty 2

## 2012-03-01 MED ORDER — GABAPENTIN 300 MG PO CAPS
300.0000 mg | ORAL_CAPSULE | Freq: Three times a day (TID) | ORAL | Status: DC
  Administered 2012-03-01 – 2012-03-04 (×10): 300 mg via ORAL
  Filled 2012-03-01 (×11): qty 1

## 2012-03-01 MED ORDER — ALBUTEROL SULFATE (5 MG/ML) 0.5% IN NEBU: 5.0000 mg | INHALATION_SOLUTION | RESPIRATORY_TRACT | Status: DC

## 2012-03-01 MED ORDER — OXYCODONE-ACETAMINOPHEN 5-325 MG PO TABS
2.0000 | ORAL_TABLET | Freq: Once | ORAL | Status: AC
  Administered 2012-03-01: 2 via ORAL
  Filled 2012-03-01: qty 2

## 2012-03-01 MED ORDER — IPRATROPIUM BROMIDE 0.02 % IN SOLN
0.5000 mg | Freq: Once | RESPIRATORY_TRACT | Status: AC
  Administered 2012-03-01: 0.5 mg via RESPIRATORY_TRACT

## 2012-03-01 MED ORDER — ALBUTEROL SULFATE (5 MG/ML) 0.5% IN NEBU
5.0000 mg | INHALATION_SOLUTION | Freq: Once | RESPIRATORY_TRACT | Status: AC
  Administered 2012-03-01: 5 mg via RESPIRATORY_TRACT

## 2012-03-01 MED ORDER — LEVOFLOXACIN IN D5W 500 MG/100ML IV SOLN
500.0000 mg | INTRAVENOUS | Status: DC
  Administered 2012-03-01 – 2012-03-02 (×2): 500 mg via INTRAVENOUS
  Filled 2012-03-01 (×3): qty 100

## 2012-03-01 MED ORDER — METOPROLOL TARTRATE 25 MG PO TABS
25.0000 mg | ORAL_TABLET | Freq: Two times a day (BID) | ORAL | Status: DC
  Administered 2012-03-01 – 2012-03-04 (×6): 25 mg via ORAL
  Filled 2012-03-01 (×7): qty 1

## 2012-03-01 MED ORDER — ALBUTEROL (5 MG/ML) CONTINUOUS INHALATION SOLN
INHALATION_SOLUTION | RESPIRATORY_TRACT | Status: AC
  Administered 2012-03-01: 5 mg via RESPIRATORY_TRACT
  Filled 2012-03-01: qty 40

## 2012-03-01 MED ORDER — POTASSIUM CHLORIDE CRYS ER 10 MEQ PO TBCR
10.0000 meq | EXTENDED_RELEASE_TABLET | Freq: Every day | ORAL | Status: DC
  Administered 2012-03-02 – 2012-03-04 (×3): 10 meq via ORAL
  Filled 2012-03-01 (×3): qty 1

## 2012-03-01 MED ORDER — BUPROPION HCL ER (XL) 150 MG PO TB24
150.0000 mg | ORAL_TABLET | Freq: Every day | ORAL | Status: DC
  Administered 2012-03-02 – 2012-03-04 (×3): 150 mg via ORAL
  Filled 2012-03-01 (×3): qty 1

## 2012-03-01 MED ORDER — SODIUM CHLORIDE 0.9 % IV SOLN: 250.0000 mL | INTRAVENOUS | Status: DC | PRN

## 2012-03-01 NOTE — ED Provider Notes (Signed)
History     CSN: [id number]  Arrival date & time 03/01/12  1327   First MD Initiated Contact with Patient 03/01/12 1411      Chief Complaint  Patient presents with  . Shortness of Breath    (Consider location/radiation/quality/duration/timing/severity/associated sxs/prior treatment) HPI Comments: Patient with history of COPD, congestive heart failure, MI, DVT, stroke -- presents with worsening shortness of breath since approximately 8 PM last night. Patient's breathing continued to worsen today so she came to the emergency department for evaluation. She has had 3 previous admissions for similar symptoms in the past 3 months. Patient states that she typically is on 3 L of oxygen at home but had to increase to 4 L this morning. She's been using her home breathing treatments approximately every 2 hours. She denies fever or chest pain. She has a productive cough. No abdominal pain or diarrhea. Patient denies worsening orthopnea or lower extremity edema. Patient continues to smoke occasionally. Onset of symptoms gradual. Course is gradually worsening. Albuterol improved her symptoms. Activity makes the symptoms worse.  The history is provided by the patient and medical records.    Past Medical History  Diagnosis Date  . Myocardial infarction     times 2  . CHF (congestive heart failure)   . Stroke   . Deep vein thrombosis     Phlebitis  . Hyperlipidemia   . Sleep apnea   . 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 Coronary artery disease   . Chronic pain     due to prior hip fracture and DDD back    Past Surgical History  Procedure Date  . Appendectomy   . Back surgery   . Cholecystectomy   . Coronary stent placement   . Breast surgery     benign tumors    Family History  Problem Relation Age of Onset  . Hypertension Mother   . Breast cancer Mother   . Rheum arthritis Maternal Grandmother   . Cancer  Sister     History  Substance Use Topics  . Smoking status: Current Every Day Smoker -- 1.0 packs/day for 40 years    Types: Cigarettes  . Smokeless tobacco: Not on file     Comment: started smoking in 1969  . Alcohol Use: No    OB History    Grav Para Term Preterm Abortions TAB SAB Ect Mult Living                  Review of Systems  Constitutional: Negative for fever.  HENT: Negative for sore throat and rhinorrhea.   Eyes: Negative for redness.  Respiratory: Positive for cough and shortness of breath.   Cardiovascular: Negative for chest pain.  Gastrointestinal: Positive for nausea. Negative for vomiting, abdominal pain and diarrhea.  Genitourinary: Negative for dysuria.  Musculoskeletal: Negative for myalgias.  Skin: Negative for rash.  Neurological: Negative for headaches.    Allergies  Aspirin and Penicillins  Home Medications   Current Outpatient Rx  Name  Route  Sig  Dispense  Refill  . ALBUTEROL SULFATE 0.63 MG/3ML IN NEBU   Nebulization   Take 1 ampule by nebulization every 6 (six) hours as needed. For wheezing         . ATORVASTATIN CALCIUM 40 MG PO TABS   Oral   Take 40 mg by mouth daily.         . BUPROPION HCL  ER (XL) 150 MG PO TB24   Oral   Take 150 mg by mouth daily.         Marland Kitchen CITALOPRAM HYDROBROMIDE 20 MG PO TABS   Oral   Take 20 mg by mouth daily.          Marland Kitchen CLONAZEPAM 0.5 MG PO TABS   Oral   Take 1 mg by mouth 3 (three) times daily as needed. For nervousness         . FENOFIBRATE 160 MG PO TABS   Oral   Take 160 mg by mouth daily.          Marland Kitchen FLUTICASONE-SALMETEROL 250-50 MCG/DOSE IN AEPB   Inhalation   Inhale 1 puff into the lungs every 12 (twelve) hours.         Marland Kitchen FOLIC ACID 1 MG PO TABS   Oral   Take 1 mg by mouth daily.         . FUROSEMIDE 80 MG PO TABS   Oral   Take 0.5 tablets (40 mg total) by mouth daily.   30 tablet   3   . GABAPENTIN 300 MG PO CAPS   Oral   Take 300 mg by mouth 3 (three) times  daily.         . IPRATROPIUM-ALBUTEROL 0.5-2.5 (3) MG/3ML IN SOLN   Nebulization   Take 3 mLs by nebulization 3 (three) times daily. For shortness of breath or wheezing   360 mL   3   . ISOSORBIDE MONONITRATE ER 30 MG PO TB24   Oral   Take 30 mg by mouth daily.         Marland Kitchen METOPROLOL TARTRATE 25 MG PO TABS   Oral   Take 25 mg by mouth 2 (two) times daily.         Marland Kitchen MONTELUKAST SODIUM 10 MG PO TABS   Oral   Take 10 mg by mouth at bedtime.         Marland Kitchen NITROGLYCERIN 0.4 MG SL SUBL   Sublingual   Place 0.4 mg under the tongue every 5 (five) minutes as needed. For chest pain         . OMEPRAZOLE 20 MG PO CPDR   Oral   Take 20 mg by mouth daily.          . OXYCODONE-ACETAMINOPHEN 10-325 MG PO TABS   Oral   Take 1 tablet by mouth every 6 (six) hours as needed for pain. For pain   30 tablet   0   . POTASSIUM CHLORIDE CRYS ER 10 MEQ PO TBCR   Oral   Take 1 tablet (10 mEq total) by mouth daily.   60 tablet   0   . PREDNISONE (PAK) 10 MG PO TABS      Prednisone dosing: Take  Prednisone 40mg  (4 tabs) x 3 days, then taper to 30mg  (3 tabs) x 3 days, then 20mg  (2 tabs) x 3days, then 10mg  (1 tab) x 3days, then OFF.   Dispense:  30 tabs, refills: None   30 tablet   0   . PROMETHAZINE HCL 25 MG PO TABS   Oral   Take 0.5-1 tablets (12.5-25 mg total) by mouth every 6 (six) hours as needed for nausea. For nausea   30 tablet   0   . RIVAROXABAN 20 MG PO TABS   Oral   Take 20 mg by mouth daily.         Marland Kitchen TIOTROPIUM BROMIDE MONOHYDRATE  18 MCG IN CAPS   Inhalation   Place 18 mcg into inhaler and inhale daily.          Marland Kitchen VITAMIN D (ERGOCALCIFEROL) 50000 UNITS PO CAPS   Oral   Take 50,000 Units by mouth every 7 (seven) days. On Mondays         . MOXIFLOXACIN HCL 400 MG PO TABS   Oral   Take 1 tablet (400 mg total) by mouth daily at 8 pm. X 5 DAYS   5 tablet   0     BP 117/66  Pulse 91  Temp 98.7 F (37.1 C) (Oral)  Resp 22  SpO2 97%  Physical Exam    Nursing note and vitals reviewed. Constitutional: She appears well-developed and well-nourished.       tremulous  HENT:  Head: Normocephalic and atraumatic.  Eyes: Conjunctivae normal are normal. Right eye exhibits no discharge. Left eye exhibits no discharge. Right eye exhibits nystagmus. Left eye exhibits nystagmus.  Neck: Normal range of motion. Neck supple.  Cardiovascular: Normal rate, regular rhythm and normal heart sounds.   Pulmonary/Chest: Effort normal and breath sounds normal.  Abdominal: Soft. There is no tenderness.  Neurological: She is alert.  Skin: Skin is warm and dry.  Psychiatric: She has a normal mood and affect.    ED Course  Procedures (including critical care time)  Labs Reviewed  CBC WITH DIFFERENTIAL - Abnormal; Notable for the following:    RBC 3.69 (*)     Hemoglobin 10.9 (*)     HCT 33.8 (*)     RDW 15.7 (*)     Monocytes Relative 15 (*)     All other components within normal limits  POCT I-STAT, CHEM 8 - Abnormal; Notable for the following:    Potassium 3.4 (*)     Chloride 92 (*)     BUN 4 (*)     Hemoglobin 11.6 (*)     HCT 34.0 (*)     All other components within normal limits   Dg Chest Port 1 View  03/01/2012  *RADIOLOGY REPORT*  Clinical Data: Short of breath.  COPD  PORTABLE CHEST - 1 VIEW  Comparison: 02/08/2012  Findings: Cardiac enlargement without heart failure.  Negative for infiltrate or effusion.  Lungs are clear and without interval change.  Chronic right rib fractures.  Cement vertebroplasty in the thoracic spine at multiple levels.  IMPRESSION: Cardiac enlargement without acute cardiopulmonary disease.   Original Report Authenticated By: Janeece Riggers, M.D.      1. COPD exacerbation     2:18 PM Patient seen and examined. Work-up initiated. Medications ordered.   Vital signs reviewed and are as follows: Filed Vitals:   03/01/12 1344  BP: 117/66  Pulse: 91  Temp: 98.7 F (37.1 C)  Resp: 22   3:44 PM Patient reviewed  with Dr. Weldon Inches. Will admit for COPD exacerbation.    Date: 03/01/2012  Rate: 89  Rhythm: normal sinus rhythm  QRS Axis: left  Intervals: normal  ST/T Wave abnormalities: non-specific t-wave changes  Conduction Disutrbances:left anterior fascicular block  Narrative Interpretation:   Old EKG Reviewed: unchanged from 02/08/12     MDM  COPD exacerbation. Patient without clinical signs of overload, CXR without edema. Doubt PE.         Renne Crigler, [place] 03/01/12 1545  [place], [place] 03/01/12 1551

## 2012-03-01 NOTE — ED Notes (Signed)
Pt here for sob and difficulty breathing, onset last night at 8pm, took albuterol tx at 1100 and had another one with ems now has expiratory wheezing, also reports nausea with po intake for 2 weeks.

## 2012-03-01 NOTE — H&P (Signed)
PCP:   Bennie Pierini, FNP   Chief Complaint:  Shortness of breath  HPI: 66 year old female with a history of chronic respiratory failure oxygen dependent ,COPD, CHF EF 40-45%, DVT on anticoagulation with xarelto, coronary artery disease, ongoing tobacco abuse, sleep apnea who came to the hospital with worsening of shortness of breath for one day. She also complains of coughing up yellow-colored phlegm. Patient also admits having fever last night. She denies vomiting or diarrhea admits to having some nausea and also some headache. She denies passing out denies dysuria urgency or frequency of urination, denies any chest pain or palpitations. Patient has had multiple admissions in the past for acute on chronic respiratory failure.  Allergies:   Allergies  Allergen Reactions  . Aspirin Other (See Comments)    Son does not remember reaction  . Penicillins Hives and Itching      Past Medical History  Diagnosis Date  . Myocardial infarction     times 2  . CHF (congestive heart failure)   . Stroke   . Deep vein thrombosis     Phlebitis  . Hyperlipidemia   . Sleep apnea   . 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 Coronary artery disease   . Chronic pain     due to prior hip fracture and DDD back    Past Surgical History  Procedure Date  . Appendectomy   . Back surgery   . Cholecystectomy   . Coronary stent placement   . Breast surgery     benign tumors    Prior to Admission medications   Medication Sig Start Date End Date Taking? Authorizing Provider  albuterol (ACCUNEB) 0.63 MG/3ML nebulizer solution Take 1 ampule by nebulization every 6 (six) hours as needed. For wheezing   Yes Historical Provider, MD  atorvastatin (LIPITOR) 40 MG tablet Take 40 mg by mouth daily.   Yes Historical Provider, MD  buPROPion (WELLBUTRIN XL) 150 MG 24 hr tablet Take 150 mg by mouth daily.   Yes Historical Provider, MD    citalopram (CELEXA) 20 MG tablet Take 20 mg by mouth daily.    Yes Historical Provider, MD  clonazePAM (KLONOPIN) 0.5 MG tablet Take 1 mg by mouth 3 (three) times daily as needed. For nervousness   Yes Historical Provider, MD  fenofibrate 160 MG tablet Take 160 mg by mouth daily.    Yes Historical Provider, MD  Fluticasone-Salmeterol (ADVAIR) 250-50 MCG/DOSE AEPB Inhale 1 puff into the lungs every 12 (twelve) hours.   Yes Historical Provider, MD  folic acid (FOLVITE) 1 MG tablet Take 1 mg by mouth daily.   Yes Historical Provider, MD  furosemide (LASIX) 80 MG tablet Take 0.5 tablets (40 mg total) by mouth daily. 02/11/12  Yes Ripudeep Jenna Luo, MD  gabapentin (NEURONTIN) 300 MG capsule Take 300 mg by mouth 3 (three) times daily.   Yes Historical Provider, MD  ipratropium-albuterol (DUONEB) 0.5-2.5 (3) MG/3ML SOLN Take 3 mLs by nebulization 3 (three) times daily. For shortness of breath or wheezing 02/11/12  Yes Ripudeep K Rai, MD  isosorbide mononitrate (IMDUR) 30 MG 24 hr tablet Take 30 mg by mouth daily.   Yes Historical Provider, MD  metoprolol tartrate (LOPRESSOR) 25 MG tablet Take 25 mg by mouth 2 (two) times daily.   Yes Historical Provider, MD  montelukast (SINGULAIR) 10 MG tablet Take 10 mg by mouth at bedtime.   Yes Historical Provider, MD  nitroGLYCERIN (NITROSTAT) 0.4 MG SL tablet Place 0.4 mg under the tongue every 5 (five) minutes as needed. For chest pain   Yes Historical Provider, MD  omeprazole (PRILOSEC) 20 MG capsule Take 20 mg by mouth daily.    Yes Historical Provider, MD  oxyCODONE-acetaminophen (PERCOCET) 10-325 MG per tablet Take 1 tablet by mouth every 6 (six) hours as needed for pain. For pain 02/11/12  Yes Ripudeep K Rai, MD  potassium chloride (K-DUR,KLOR-CON) 10 MEQ tablet Take 1 tablet (10 mEq total) by mouth daily. 02/11/12 02/10/13 Yes Ripudeep Jenna Luo, MD  predniSONE (STERAPRED UNI-PAK) 10 MG tablet Prednisone dosing: Take  Prednisone 40mg  (4 tabs) x 3 days, then taper to  30mg  (3 tabs) x 3 days, then 20mg  (2 tabs) x 3days, then 10mg  (1 tab) x 3days, then OFF.   Dispense:  30 tabs, refills: None 02/11/12  Yes Ripudeep K Rai, MD  promethazine (PHENERGAN) 25 MG tablet Take 0.5-1 tablets (12.5-25 mg total) by mouth every 6 (six) hours as needed for nausea. For nausea 01/04/12  Yes Meredeth Ide, MD  Rivaroxaban (XARELTO) 20 MG TABS Take 20 mg by mouth daily.   Yes Historical Provider, MD  tiotropium (SPIRIVA) 18 MCG inhalation capsule Place 18 mcg into inhaler and inhale daily.    Yes Historical Provider, MD  Vitamin D, Ergocalciferol, (DRISDOL) 50000 UNITS CAPS Take 50,000 Units by mouth every 7 (seven) days. On Mondays   Yes Historical Provider, MD  moxifloxacin (AVELOX) 400 MG tablet Take 1 tablet (400 mg total) by mouth daily at 8 pm. X 5 DAYS 02/11/12   Ripudeep Jenna Luo, MD    Social History:  reports that she has been smoking Cigarettes.  She has a 40 pack-year smoking history. She does not have any smokeless tobacco history on file. She reports that she does not drink alcohol or use illicit drugs.  Family History  Problem Relation Age of Onset  . Hypertension Mother   . Breast cancer Mother   . Rheum arthritis Maternal Grandmother   . Cancer Sister     Review of Systems:  HEENT: Denies headache, blurred vision, runny nose, sore throat,  Neck: Denies thyroid problems,lymphadenopathy Chest : See history of present illness Heart : Denies Chest pain,  positive history of CAD GI: See history of present illness GU: Denies dysuria, urgency, frequency of urination, hematuria Neuro: Positive history of stroke,  No seizures syncope    Physical Exam: Blood pressure 117/66, pulse 91, temperature 98.7 F (37.1 C), temperature source Oral, resp. rate 22, SpO2 97.00%. Constitutional:   Patient is a well-developed and well-nourished female in no acute distress and cooperative with exam. Head: Normocephalic and atraumatic Mouth: Mucus membranes moist Eyes: PERRL,  EOMI, conjunctivae normal Neck: Supple, No Thyromegaly Cardiovascular: RRR, S1 normal, S2 normal Pulmonary/Chest: Bilateral wheezing Abdominal: Soft. Non-tender, non-distended, bowel sounds are normal, no masses, organomegaly, or guarding present.  Neurological: A&O x3, Strenght is normal and symmetric bilaterally, cranial nerve II-XII are grossly intact, no focal motor deficit, sensory intact to light touch bilaterally.  Extremities : No Cyanosis, Clubbing or Edema   Labs on Admission:  Results for orders placed during the hospital encounter of 03/01/12 (from the past 48 hour(s))  CBC WITH DIFFERENTIAL     Status: Abnormal   Collection Time   03/01/12  2:14 PM      Component Value Range Comment   WBC 6.6  4.0 - 10.5 K/uL    RBC 3.69 (*) 3.87 - 5.11 MIL/uL  Hemoglobin 10.9 (*) 12.0 - 15.0 g/dL    HCT 16.1 (*) 09.6 - 46.0 %    MCV 91.6  78.0 - 100.0 fL    MCH 29.5  26.0 - 34.0 pg    MCHC 32.2  30.0 - 36.0 g/dL    RDW 04.5 (*) 40.9 - 15.5 %    Platelets 239  150 - 400 K/uL    Neutrophils Relative 44  43 - 77 %    Neutro Abs 2.9  1.7 - 7.7 K/uL    Lymphocytes Relative 40  12 - 46 %    Lymphs Abs 2.7  0.7 - 4.0 K/uL    Monocytes Relative 15 (*) 3 - 12 %    Monocytes Absolute 1.0  0.1 - 1.0 K/uL    Eosinophils Relative 1  0 - 5 %    Eosinophils Absolute 0.1  0.0 - 0.7 K/uL    Basophils Relative 0  0 - 1 %    Basophils Absolute 0.0  0.0 - 0.1 K/uL   POCT I-STAT, CHEM 8     Status: Abnormal   Collection Time   03/01/12  2:25 PM      Component Value Range Comment   Sodium 138  135 - 145 mEq/L    Potassium 3.4 (*) 3.5 - 5.1 mEq/L    Chloride 92 (*) 96 - 112 mEq/L    BUN 4 (*) 6 - 23 mg/dL    Creatinine, Ser 8.11  0.50 - 1.10 mg/dL    Glucose, Bld 73  70 - 99 mg/dL    Calcium, Ion 9.14  7.82 - 1.30 mmol/L    TCO2 36  0 - 100 mmol/L    Hemoglobin 11.6 (*) 12.0 - 15.0 g/dL    HCT 95.6 (*) 21.3 - 46.0 %     Radiological Exams on Admission: Dg Chest Port 1 View  03/01/2012   *RADIOLOGY REPORT*  Clinical Data: Short of breath.  COPD  PORTABLE CHEST - 1 VIEW  Comparison: 02/08/2012  Findings: Cardiac enlargement without heart failure.  Negative for infiltrate or effusion.  Lungs are clear and without interval change.  Chronic right rib fractures.  Cement vertebroplasty in the thoracic spine at multiple levels.  IMPRESSION: Cardiac enlargement without acute cardiopulmonary disease.   Original Report Authenticated By: Janeece Riggers, M.D.     Assessment/Plan Acute on chronic respiratory failure, multifactorial CHF , euvolemic DVT on anticoagulation Tobacco abuse Hypertension Coronary artery disease 1 Anxiety  Acute on chronic respiratory failure  Patient has chronic respiratory failure which is multifactorial due to COPD and CHF. At this time patient is in COPD exacerbation and she appears euvolemic. We will continue the patient on home dose of Lasix 40 mg by mouth daily. She will be started on IV Solu-Medrol 60 mg IV every 6 hours Levaquin 500 mg IV daily,  DuoNeb nebulizers every 4 hours.  CHF currently patient is euvolemic he will continue her on Lasix 40 mg daily  DVT Patient has history of DVT and will continue on xarelto.  Hypertension We'll continue her on metoprolol 25 twice a day.  Coronary artery disease Continue imdur, metoprolol.  Anxiety Continue patient on Klonopin 0.5 mg by mouth 3 times a day when necessary  DVT prophylaxis Currently patient on xarelto   [facility] Pager: [phone] 03/01/2012, 3:54 PM

## 2012-03-02 LAB — COMPREHENSIVE METABOLIC PANEL
ALT: 30 U/L (ref 0–35)
Alkaline Phosphatase: 45 U/L (ref 39–117)
BUN: 8 mg/dL (ref 6–23)
CO2: 33 mEq/L — ABNORMAL HIGH (ref 19–32)
Chloride: 95 mEq/L — ABNORMAL LOW (ref 96–112)
GFR calc Af Amer: >90 mL/min (ref >90)
GFR calc non Af Amer: 87 mL/min — ABNORMAL LOW (ref >90)
Glucose, Bld: 119 mg/dL — ABNORMAL HIGH (ref 70–99)
Potassium: 4.4 mEq/L (ref 3.5–5.1)
Sodium: 138 mEq/L (ref 135–145)
Total Bilirubin: 0.2 mg/dL — ABNORMAL LOW (ref 0.3–1.2)
Total Protein: 5.4 g/dL — ABNORMAL LOW (ref 6.0–8.3)

## 2012-03-02 LAB — CBC
HCT: 32.2 % — ABNORMAL LOW (ref 36.0–46.0)
MCHC: 32.9 g/dL (ref 30.0–36.0)
Platelets: 220 10*3/uL (ref 150–400)
RDW: 15.5 % (ref 11.5–15.5)

## 2012-03-02 MED ORDER — ALBUTEROL SULFATE (5 MG/ML) 0.5% IN NEBU: 2.5000 mg | INHALATION_SOLUTION | RESPIRATORY_TRACT | Status: DC | PRN

## 2012-03-02 MED ORDER — ALBUTEROL SULFATE (5 MG/ML) 0.5% IN NEBU
2.5000 mg | INHALATION_SOLUTION | Freq: Three times a day (TID) | RESPIRATORY_TRACT | Status: DC
  Administered 2012-03-02 (×2): 2.5 mg via RESPIRATORY_TRACT
  Filled 2012-03-02 (×4): qty 0.5

## 2012-03-02 MED ORDER — IPRATROPIUM BROMIDE 0.02 % IN SOLN
0.5000 mg | Freq: Three times a day (TID) | RESPIRATORY_TRACT | Status: DC
  Administered 2012-03-02 (×2): 0.5 mg via RESPIRATORY_TRACT
  Filled 2012-03-02 (×4): qty 2.5

## 2012-03-02 NOTE — ED Provider Notes (Signed)
Medical screening examination/treatment/procedure(s) were performed by non-physician practitioner and as supervising physician I was immediately available for consultation/collaboration.  Cheri Guppy, MD 03/02/12 [phone]

## 2012-03-02 NOTE — Progress Notes (Signed)
[facility] PROGRESS NOTE  Aimee Sanders JYN:[id number] DOB: Mar 22, 1946 DOA: 03/01/2012 PCP: Bennie Pierini, FNP  Assessment/Plan: COPD exacerbation/acute on chronic respiratory failure -Continue intravenous steroids -Continue duo nebs -Stop smoking 3 months ago -Continue Levaquin Chronic systolic and diastolic CHF -Well compensated at this time -Continue home dose of Lasix -Ejection fraction 40-45% History of DVT -Continue rivaroxaban Hypertension -Continue metoprolol titrate 25 mg twice a day      Disposition Plan:   Home when medically stable    Antibiotics:  Levaquin November 12>>>    Procedures/Studies: Ct Angio Chest Pe W/cm &/or Wo Cm  02/08/2012  *RADIOLOGY REPORT*  Clinical Data: Sudden onset shortness of breath.  Lower extremity edema.  CT ANGIOGRAPHY CHEST  Technique:  Multidetector CT imaging of the chest using the standard protocol during bolus administration of intravenous contrast. Multiplanar reconstructed images including MIPs were obtained and reviewed to evaluate the vascular anatomy.  Contrast: OMNIPAQUE IOHEXOL 350 MG/ML SOLN  Comparison: 11/24/2011.  Findings: Evaluation for subsegmental pulmonary emboli is somewhat limited by respiratory motion.  No central, lumbar or segmental pulmonary embolus.  No pathologically enlarged mediastinal, hilar or axillary lymph nodes.  Atherosclerotic calcification of the arterial vasculature, including coronary arteries.  Pulmonary arteries are borderline enlarged.  Heart is enlarged.  No pericardial effusion.  Again, respiratory motion degrades image quality upon review of the lungs.  Mild emphysematous changes.  Chronic-appearing volume loss adjacent to the left heart border, within the lingula.  Scarring or atelectasis in the lower lobes, left greater than right.  No pleural fluid.  Airway is unremarkable.  Incidental imaging of the upper abdomen shows an irregular liver margin.  Appear low  attenuation in the upper pole right kidney may be artifactual, related to streak artifact from adjacent vertebroplasty, as there is no corresponding abnormality on examination of 11/24/2011 or 06/13/2010.  No worrisome lytic or sclerotic lesions.  Vertebroplasties and old right rib fractures are noted.  C7 compression fracture appears new from 11/24/2011.  There is a new superior endplate compression fracture involving T4. L1 compression fracture is stable.  IMPRESSION:  1.  Evaluation for subsegmental pulmonary emboli is limited by respiratory motion.  No central, lobar or segmental pulmonary embolus. 2.  Mildly irregular liver margin is indicative of cirrhosis. 3.  New C7 and T4 compression fractures.   Original Report Authenticated By: Reyes Ivan, M.D.    Dg Chest Port 1 View  03/01/2012  *RADIOLOGY REPORT*  Clinical Data: Short of breath.  COPD  PORTABLE CHEST - 1 VIEW  Comparison: 02/08/2012  Findings: Cardiac enlargement without heart failure.  Negative for infiltrate or effusion.  Lungs are clear and without interval change.  Chronic right rib fractures.  Cement vertebroplasty in the thoracic spine at multiple levels.  IMPRESSION: Cardiac enlargement without acute cardiopulmonary disease.   Original Report Authenticated By: Janeece Riggers, M.D.    Dg Chest Portable 1 View  02/08/2012  *RADIOLOGY REPORT*  Clinical Data: Shortness of breath.  Prior history of MI, CHF, stroke, DVT.  Smoker with current history of COPD.  PORTABLE CHEST - 1 VIEW 02/08/2012 1128 hours:  Comparison: Two-view chest x-ray 12/31/2011 [place] and 06/11/2010 [facility].  Portable chest x-ray 12/07/2011 [facility].  Findings: Cardiac silhouette moderately enlarged but stable. Thoracic aorta mildly tortuous and atherosclerotic, unchanged. Hilar and mediastinal contours otherwise unremarkable.  Pulmonary vascularity normal.  Scarring at the left lung base, unchanged.  No new pulmonary  parenchymal abnormalities.  Prior augmentation of 3  mid and lower thoracic vertebrae.  IMPRESSION: Stable cardiomegaly.  Scarring at the left lung base.  No acute cardiopulmonary disease.   Original Report Authenticated By: Arnell Sieving, M.D.          Subjective: Patient states that she is breathing 50% better. She denies any fevers, chills, chest pain, nausea, vomiting, diarrhea, abdominal pain, rashes, dysuria.  Objective: Filed Vitals:   03/02/12 0814 03/02/12 0904 03/02/12 1453 03/02/12 1500  BP:  105/56  101/59  Pulse: 71 68 62 76  Temp:    97.1 F (36.2 C)  TempSrc:    Oral  Resp: 18  18 18   Height:      Weight:      SpO2: 95%  96% 98%    Intake/Output Summary (Last 24 hours) at 03/02/12 1801 Last data filed at 03/02/12 1500  Gross per 24 hour  Intake   1069 ml  Output   1850 ml  Net   -781 ml   Weight change:  Exam:   General:  Pt is alert, follows commands appropriately, not in acute distress  HEENT: No icterus, No thrush, No neck mass, [place]/AT  Cardiovascular: RRR, S1/S2, no rubs, no gallops  Respiratory: Bilateral expiratory wheezes. Good air movement. Basilar crackles.  Abdomen: Soft/+BS, non tender, non distended, no guarding  Extremities: No edema, No lymphangitis, No petechiae, No rashes, no synovitis  Data Reviewed: Basic Metabolic Panel:  Lab 03/02/12 1610 03/01/12 1425  NA 138 138  K 4.4 3.4*  CL 95* 92*  CO2 33* --  GLUCOSE 119* 73  BUN 8 4*  CREATININE 0.71 1.10  CALCIUM 9.2 --  MG -- --  PHOS -- --   Liver Function Tests:  Lab 03/02/12 0620  AST 51*  ALT 30  ALKPHOS 45  BILITOT 0.2*  PROT 5.4*  ALBUMIN 2.6*   No results found for this basename: LIPASE:5,AMYLASE:5 in the last 168 hours No results found for this basename: AMMONIA:5 in the last 168 hours CBC:  Lab 03/02/12 0620 03/01/12 1425 03/01/12 1414  WBC 3.9* -- 6.6  NEUTROABS -- -- 2.9  HGB 10.6* 11.6* 10.9*  HCT 32.2* 34.0* 33.8*  MCV 91.5 -- 91.6  PLT 220  -- 239   Cardiac Enzymes: No results found for this basename: CKTOTAL:5,CKMB:5,CKMBINDEX:5,TROPONINI:5 in the last 168 hours BNP: No components found with this basename: POCBNP:5 CBG:  Lab 03/02/12 0638  GLUCAP 112*    No results found for this or any previous visit (from the past 240 hour(s)).   Scheduled Meds:   . ipratropium  0.5 mg Nebulization TID   And  . albuterol  2.5 mg Nebulization TID  . atorvastatin  40 mg Oral q1800  . buPROPion  150 mg Oral Daily  . citalopram  20 mg Oral Daily  . fenofibrate  160 mg Oral Daily  . folic acid  1 mg Oral Daily  . furosemide  40 mg Oral Daily  . gabapentin  300 mg Oral TID  . isosorbide mononitrate  30 mg Oral Daily  . levofloxacin (LEVAQUIN) IV  500 mg Intravenous Q24H  . methylPREDNISolone (SOLU-MEDROL) injection  60 mg Intravenous Q6H  . metoprolol tartrate  25 mg Oral BID  . potassium chloride  10 mEq Oral Daily  . Rivaroxaban  20 mg Oral QAC supper  . sodium chloride  3 mL Intravenous Q12H  . [DISCONTINUED] albuterol  2.5 mg Nebulization Q4H  . [DISCONTINUED] albuterol  2.5 mg Nebulization Q6H  . [DISCONTINUED] albuterol  2.5 mg Nebulization Q6H  . [DISCONTINUED] ipratropium  0.5 mg Nebulization Q4H  . [DISCONTINUED] ipratropium  0.5 mg Nebulization Q4H  . [DISCONTINUED] ipratropium  0.5 mg Nebulization Q6H   Continuous Infusions:    Karlynn Furrow, DO  [facility] Pager [phone]  If 7PM-7AM, please contact night-coverage [email or web address] Password [email or web address] 03/02/2012, 6:01 PM   LOS: 1 day

## 2012-03-03 DIAGNOSIS — D649 Anemia, unspecified: Secondary | ICD-10-CM

## 2012-03-03 LAB — BASIC METABOLIC PANEL
BUN: 16 mg/dL (ref 6–23)
CO2: 39 mEq/L — ABNORMAL HIGH (ref 19–32)
Chloride: 91 mEq/L — ABNORMAL LOW (ref 96–112)
Creatinine, Ser: 0.75 mg/dL (ref 0.50–1.10)
Glucose, Bld: 120 mg/dL — ABNORMAL HIGH (ref 70–99)

## 2012-03-03 MED ORDER — ALBUTEROL SULFATE (5 MG/ML) 0.5% IN NEBU
2.5000 mg | INHALATION_SOLUTION | Freq: Four times a day (QID) | RESPIRATORY_TRACT | Status: DC
  Administered 2012-03-03 – 2012-03-04 (×4): 2.5 mg via RESPIRATORY_TRACT
  Filled 2012-03-03 (×3): qty 0.5

## 2012-03-03 MED ORDER — IPRATROPIUM BROMIDE 0.02 % IN SOLN
0.5000 mg | Freq: Four times a day (QID) | RESPIRATORY_TRACT | Status: DC
  Administered 2012-03-03 – 2012-03-04 (×4): 0.5 mg via RESPIRATORY_TRACT
  Filled 2012-03-03 (×3): qty 2.5

## 2012-03-03 MED ORDER — CLONAZEPAM 0.5 MG PO TABS
0.5000 mg | ORAL_TABLET | Freq: Three times a day (TID) | ORAL | Status: DC
  Administered 2012-03-03 – 2012-03-04 (×3): 0.5 mg via ORAL
  Filled 2012-03-03 (×3): qty 1

## 2012-03-03 MED ORDER — LEVOFLOXACIN 750 MG PO TABS
750.0000 mg | ORAL_TABLET | ORAL | Status: DC
  Administered 2012-03-03: 750 mg via ORAL
  Filled 2012-03-03 (×2): qty 1

## 2012-03-03 NOTE — Progress Notes (Signed)
[facility]  Patient Status: Active (receiving services up to time of hospitalization)  [facility] is providing the following services: RN  If patient discharges after hours, please call [phone].   Aimee Sanders 03/03/2012, 11:19 AM

## 2012-03-03 NOTE — Progress Notes (Signed)
[facility] PROGRESS NOTE  Aimee Sanders AVW:[id number] DOB: October 14, 1945 DOA: 03/01/2012 PCP: Bennie Pierini, FNP  Assessment/Plan: COPD exacerbation/acute on chronic respiratory failure  -Continue intravenous steroids  -Continue duo nebs  -Stopped smoking 3 months ago  -Continue Levaquin--change Levaquin to by mouth 750 mg daily Chronic systolic and diastolic CHF  -Well compensated at this time  -Continue home dose of Lasix  -Ejection fraction 40-45%  History of DVT  -Continue rivaroxaban  Hypertension  -Continue metoprolol titrate 25 mg twice a day Anxiety -Restart Klonopin 0.5 mg 3 times a day Coronary artery disease with history of myocardial infarction -Continue Imdur     Family Communication:   son at beside Disposition Plan:   Home when medically stable    Antibiotics:  Levofloxacin November 12>>>   Procedures/Studies: Ct Angio Chest Pe W/cm &/or Wo Cm  02/08/2012  *RADIOLOGY REPORT*  Clinical Data: Sudden onset shortness of breath.  Lower extremity edema.  CT ANGIOGRAPHY CHEST  Technique:  Multidetector CT imaging of the chest using the standard protocol during bolus administration of intravenous contrast. Multiplanar reconstructed images including MIPs were obtained and reviewed to evaluate the vascular anatomy.  Contrast: OMNIPAQUE IOHEXOL 350 MG/ML SOLN  Comparison: 11/24/2011.  Findings: Evaluation for subsegmental pulmonary emboli is somewhat limited by respiratory motion.  No central, lumbar or segmental pulmonary embolus.  No pathologically enlarged mediastinal, hilar or axillary lymph nodes.  Atherosclerotic calcification of the arterial vasculature, including coronary arteries.  Pulmonary arteries are borderline enlarged.  Heart is enlarged.  No pericardial effusion.  Again, respiratory motion degrades image quality upon review of the lungs.  Mild emphysematous changes.  Chronic-appearing volume loss adjacent to the left heart border, within  the lingula.  Scarring or atelectasis in the lower lobes, left greater than right.  No pleural fluid.  Airway is unremarkable.  Incidental imaging of the upper abdomen shows an irregular liver margin.  Appear low attenuation in the upper pole right kidney may be artifactual, related to streak artifact from adjacent vertebroplasty, as there is no corresponding abnormality on examination of 11/24/2011 or 06/13/2010.  No worrisome lytic or sclerotic lesions.  Vertebroplasties and old right rib fractures are noted.  C7 compression fracture appears new from 11/24/2011.  There is a new superior endplate compression fracture involving T4. L1 compression fracture is stable.  IMPRESSION:  1.  Evaluation for subsegmental pulmonary emboli is limited by respiratory motion.  No central, lobar or segmental pulmonary embolus. 2.  Mildly irregular liver margin is indicative of cirrhosis. 3.  New C7 and T4 compression fractures.   Original Report Authenticated By: Reyes Ivan, M.D.    Dg Chest Port 1 View  03/01/2012  *RADIOLOGY REPORT*  Clinical Data: Short of breath.  COPD  PORTABLE CHEST - 1 VIEW  Comparison: 02/08/2012  Findings: Cardiac enlargement without heart failure.  Negative for infiltrate or effusion.  Lungs are clear and without interval change.  Chronic right rib fractures.  Cement vertebroplasty in the thoracic spine at multiple levels.  IMPRESSION: Cardiac enlargement without acute cardiopulmonary disease.   Original Report Authenticated By: Janeece Riggers, M.D.    Dg Chest Portable 1 View  02/08/2012  *RADIOLOGY REPORT*  Clinical Data: Shortness of breath.  Prior history of MI, CHF, stroke, DVT.  Smoker with current history of COPD.  PORTABLE CHEST - 1 VIEW 02/08/2012 1128 hours:  Comparison: Two-view chest x-ray 12/31/2011 [place] and 06/11/2010 [facility].  Portable chest x-ray 12/07/2011 [facility].  Findings: Cardiac  silhouette moderately enlarged but stable.  Thoracic aorta mildly tortuous and atherosclerotic, unchanged. Hilar and mediastinal contours otherwise unremarkable.  Pulmonary vascularity normal.  Scarring at the left lung base, unchanged.  No new pulmonary parenchymal abnormalities.  Prior augmentation of 3 mid and lower thoracic vertebrae.  IMPRESSION: Stable cardiomegaly.  Scarring at the left lung base.  No acute cardiopulmonary disease.   Original Report Authenticated By: Arnell Sieving, M.D.          Subjective: Patient complains of some nausea without vomiting. Denies any abdominal pain. It breakfast and lunch without any difficulty. Denies any fevers, chills, chest discomfort, dysuria, hematuria, rashes.  Objective: Filed Vitals:   03/02/12 2143 03/03/12 0459 03/03/12 0916 03/03/12 1337  BP: 110/60 95/45 112/52 114/49  Pulse: 70 59 66 62  Temp:  98.1 F (36.7 C)  98.4 F (36.9 C)  TempSrc:  Oral  Oral  Resp:  18  20  Height:      Weight:  59.2 kg (130 lb 8.2 oz)    SpO2:  94%  96%    Intake/Output Summary (Last 24 hours) at 03/03/12 1441 Last data filed at 03/03/12 1237  Gross per 24 hour  Intake   1320 ml  Output   1175 ml  Net    145 ml   Weight change: 1.094 kg (2 lb 6.6 oz) Exam:   General:  Pt is alert, follows commands appropriately, not in acute distress  HEENT: No icterus, No thrush,  [place]/AT  Cardiovascular: RRR, S1/S2, no rubs, no gallops  Respiratory: Bilateral scattered crackles, no wheezes. Moderate air movement.  Abdomen: Soft/+BS, non tender, non distended, no guarding  Extremities: No edema, No lymphangitis, No petechiae, No rashes, no synovitis  Data Reviewed: Basic Metabolic Panel:  Lab 03/03/12 1610 03/02/12 0620 03/01/12 1425  NA 133* 138 138  K 4.0 4.4 3.4*  CL 91* 95* 92*  CO2 39* 33* --  GLUCOSE 120* 119* 73  BUN 16 8 4*  CREATININE 0.75 0.71 1.10  CALCIUM 8.9 9.2 --  MG -- -- --  PHOS -- -- --   Liver Function Tests:  Lab 03/02/12 0620  AST 51*  ALT 30  ALKPHOS  45  BILITOT 0.2*  PROT 5.4*  ALBUMIN 2.6*   No results found for this basename: LIPASE:5,AMYLASE:5 in the last 168 hours No results found for this basename: AMMONIA:5 in the last 168 hours CBC:  Lab 03/02/12 0620 03/01/12 1425 03/01/12 1414  WBC 3.9* -- 6.6  NEUTROABS -- -- 2.9  HGB 10.6* 11.6* 10.9*  HCT 32.2* 34.0* 33.8*  MCV 91.5 -- 91.6  PLT 220 -- 239   Cardiac Enzymes: No results found for this basename: CKTOTAL:5,CKMB:5,CKMBINDEX:5,TROPONINI:5 in the last 168 hours BNP: No components found with this basename: POCBNP:5 CBG:  Lab 03/02/12 0638  GLUCAP 112*    No results found for this or any previous visit (from the past 240 hour(s)).   Scheduled Meds:   . ipratropium  0.5 mg Nebulization TID   And  . albuterol  2.5 mg Nebulization TID  . atorvastatin  40 mg Oral q1800  . buPROPion  150 mg Oral Daily  . citalopram  20 mg Oral Daily  . fenofibrate  160 mg Oral Daily  . folic acid  1 mg Oral Daily  . furosemide  40 mg Oral Daily  . gabapentin  300 mg Oral TID  . isosorbide mononitrate  30 mg Oral Daily  . levofloxacin (LEVAQUIN) IV  500 mg  Intravenous Q24H  . methylPREDNISolone (SOLU-MEDROL) injection  60 mg Intravenous Q6H  . metoprolol tartrate  25 mg Oral BID  . potassium chloride  10 mEq Oral Daily  . Rivaroxaban  20 mg Oral QAC supper  . sodium chloride  3 mL Intravenous Q12H   Continuous Infusions:    Kamran Coker, DO  [facility] Pager [phone]  If 7PM-7AM, please contact night-coverage [email or web address] Password [email or web address] 03/03/2012, 2:41 PM   LOS: 2 days

## 2012-03-04 ENCOUNTER — Other Ambulatory Visit: Payer: Self-pay | Admitting: Cardiology

## 2012-03-04 LAB — BASIC METABOLIC PANEL
BUN: 18 mg/dL (ref 6–23)
CO2: 39 mEq/L — ABNORMAL HIGH (ref 19–32)
Calcium: 8.9 mg/dL (ref 8.4–10.5)
Creatinine, Ser: 0.74 mg/dL (ref 0.50–1.10)
Glucose, Bld: 104 mg/dL — ABNORMAL HIGH (ref 70–99)
Sodium: 135 mEq/L (ref 135–145)

## 2012-03-04 MED ORDER — PREDNISONE 20 MG PO TABS: ORAL_TABLET | ORAL | Status: DC

## 2012-03-04 MED ORDER — ISOSORBIDE MONONITRATE ER 30 MG PO TB24: 30.0000 mg | ORAL_TABLET | Freq: Every day | ORAL | Status: DC

## 2012-03-04 MED ORDER — METHYLPREDNISOLONE SODIUM SUCC 125 MG IJ SOLR
60.0000 mg | Freq: Two times a day (BID) | INTRAMUSCULAR | Status: DC
  Filled 2012-03-04 (×2): qty 0.96

## 2012-03-04 MED ORDER — LEVOFLOXACIN 750 MG PO TABS: 750.0000 mg | ORAL_TABLET | ORAL | Status: DC

## 2012-03-04 NOTE — Progress Notes (Signed)
Pt d/c to home w/ family. D.c instructions and medications  reviewed with PT. Pt states understanding. All Pt questions answered

## 2012-03-04 NOTE — Discharge Summary (Signed)
Physician Discharge Summary  Aimee Sanders:[id number] DOB: 08/06/1945 DOA: 03/01/2012  PCP: Bennie Pierini, FNP  Admit date: 03/01/2012 Discharge date: 03/04/2012  Recommendations for Outpatient Follow-up:  1. Pt will need to follow up with PCP in 2 weeks post discharge  Discharge Diagnoses:  Active Problems:  * No active hospital problems. *   COPD exacerbation/acute on chronic respiratory failure  -Continue intravenous steroids  -Continue Advair and spriva at home with rescue albuterol -Stopped smoking 3 months ago  -Continue Levaquin--change Levaquin to by mouth 750 mg daily x 4 days Chronic systolic and diastolic CHF  -Well compensated at this time  -Continue home dose of Lasix  -Ejection fraction 40-45%  History of DVT  -Continue rivaroxaban  Hypertension  -Continue metoprolol titrate 25 mg twice a day  Anxiety  -Restart Klonopin 0.5 mg 3 times a day  Coronary artery disease with history of myocardial infarction  -Continue Imdur  Discharge Condition: Stable  Disposition:  discharge home  Diet: Cardiac Wt Readings from Last 3 Encounters:  03/04/12 59.376 kg (130 lb 14.4 oz)  02/11/12 58.741 kg (129 lb 8 oz)  01/02/12 60.147 kg (132 lb 9.6 oz)    History of present illness:  66 year old female with a history of chronic respiratory failure oxygen dependent COPD, CHF EF 40-45%, DVT on anticoagulation with xarelto, coronary artery disease, ongoing tobacco abuse, sleep apnea who came to the hospital with worsening of shortness of breath for one day. She also complains of coughing up yellow-colored phlegm. Patient also admits having fever on 02/29/12. She denies vomiting or diarrhea admits to having some nausea and also some headache. She denies passing out denies dysuria urgency or frequency of urination, denies any chest pain or palpitations.    Hospital Course:  The patient was started on intravenous Solu-Medrol 60 mg IV every 6 hours. In addition,  intravenous levofloxacin was started for the patient. The patient gradually improved each day with continued intravenous steroids. At the time of discharge, the patient stated that her breathing was 75% better. She still has some dyspnea on exertion, but this was thought to be due to partly deconditioning. The patient had intermittently refused her nebulizers. She was encouraged to use them regularly. The patient will be transitioned to oral prednisone taper over the next 8 days. Instructions were given to the patient to take 60 mg twice a day x2 days, 40 mg twice a day x2 days, 20 mg twice a day x2 days, and 20 mg x2 days. Tobacco cessation was discussed. She was instructed to continue her anticholinergic inhaler as well as her long-acting beta agonist. Regarding the patient's CHF, the patient remained well compensated. There were no signs of fluid overload. The patient was maintained on her home dose of furosemide. In addition, the patient was maintained on Imdur, metoprolol, and Lipitor. The patient was restarted on her home dose of clonazepam. She tolerated it well. She'll be discharged home with 4 additional days of levofloxacin. The patient was continued on her rivaroxaban. She will continue this at home and follow up with her primary care provider.    Discharge Exam: Filed Vitals:   03/04/12 1025  BP: 107/57  Pulse: 64  Temp:   Resp:    Filed Vitals:   03/04/12 0127 03/04/12 0533 03/04/12 0906 03/04/12 1025  BP:  106/56  107/57  Pulse:  56  64  Temp:  98.4 F (36.9 C)    TempSrc:  Oral    Resp:  20  Height:      Weight:  59.376 kg (130 lb 14.4 oz)    SpO2: 94% 97% 97% 93%   General: A&O x 3, NAD, pleasant, cooperative Cardiovascular: RRR, no rub, no gallop, no S3 Respiratory: Scattered crackles. Good air movement. No wheezes or rhonchi. Abdomen:soft, nontender, nondistended, positive bowel sounds Extremities: No edema, No lymphangitis, no petechiae  Discharge  Instructions  Discharge Orders    Future Orders Please Complete By Expires   Diet - low sodium heart healthy      Increase activity slowly      Discharge instructions      Comments:   Prednisone-->Take 60mg  (3 tablets) twice a day x 2 days, then 40mg  (2tablets) twice a day x 2 days, then 20mg  (1 tablet) twice a day x 2 days then take 20mg  (1 tablet) once daily x 2 days  Levaquin 750 mg--> take one tablet daily until gone Avelox--stop taking       Medication List     As of 03/04/2012 10:58 AM    STOP taking these medications         moxifloxacin 400 MG tablet   Commonly known as: AVELOX      predniSONE 10 MG tablet   Commonly known as: STERAPRED UNI-PAK      TAKE these medications         albuterol 0.63 MG/3ML nebulizer solution   Commonly known as: ACCUNEB   Take 1 ampule by nebulization every 6 (six) hours as needed. For wheezing      atorvastatin 40 MG tablet   Commonly known as: LIPITOR   Take 40 mg by mouth daily.      buPROPion 150 MG 24 hr tablet   Commonly known as: WELLBUTRIN XL   Take 150 mg by mouth daily.      citalopram 20 MG tablet   Commonly known as: CELEXA   Take 20 mg by mouth daily.      clonazePAM 0.5 MG tablet   Commonly known as: KLONOPIN   Take 1 mg by mouth 3 (three) times daily as needed. For nervousness      fenofibrate 160 MG tablet   Take 160 mg by mouth daily.      Fluticasone-Salmeterol 250-50 MCG/DOSE Aepb   Commonly known as: ADVAIR   Inhale 1 puff into the lungs every 12 (twelve) hours.      folic acid 1 MG tablet   Commonly known as: FOLVITE   Take 1 mg by mouth daily.      furosemide 80 MG tablet   Commonly known as: LASIX   Take 0.5 tablets (40 mg total) by mouth daily.      gabapentin 300 MG capsule   Commonly known as: NEURONTIN   Take 300 mg by mouth 3 (three) times daily.      ipratropium-albuterol 0.5-2.5 (3) MG/3ML Soln   Commonly known as: DUONEB   Take 3 mLs by nebulization 3 (three) times daily. For  shortness of breath or wheezing      isosorbide mononitrate 30 MG 24 hr tablet   Commonly known as: IMDUR   Take 1 tablet (30 mg total) by mouth daily.      levofloxacin 750 MG tablet   Commonly known as: LEVAQUIN   Take 1 tablet (750 mg total) by mouth daily.      metoprolol tartrate 25 MG tablet   Commonly known as: LOPRESSOR   Take 25 mg by mouth 2 (two) times daily.  montelukast 10 MG tablet   Commonly known as: SINGULAIR   Take 10 mg by mouth at bedtime.      nitroGLYCERIN 0.4 MG SL tablet   Commonly known as: NITROSTAT   Place 0.4 mg under the tongue every 5 (five) minutes as needed. For chest pain      omeprazole 20 MG capsule   Commonly known as: PRILOSEC   Take 20 mg by mouth daily.      oxyCODONE-acetaminophen 10-325 MG per tablet   Commonly known as: PERCOCET   Take 1 tablet by mouth every 6 (six) hours as needed for pain. For pain      potassium chloride 10 MEQ tablet   Commonly known as: K-DUR,KLOR-CON   Take 1 tablet (10 mEq total) by mouth daily.      predniSONE 20 MG tablet   Commonly known as: DELTASONE   Take 60mg  (3 tablets) twice a day x 2 days, then 40mg  (2tablets) twice a day x 2 days, then 20mg  (1 tablet) twice a day x 2 days then take 20mg  (1 tablet) once daily x 2 days      promethazine 25 MG tablet   Commonly known as: PHENERGAN   Take 0.5-1 tablets (12.5-25 mg total) by mouth every 6 (six) hours as needed for nausea. For nausea      Rivaroxaban 20 MG Tabs   Commonly known as: XARELTO   Take 20 mg by mouth daily.      tiotropium 18 MCG inhalation capsule   Commonly known as: SPIRIVA   Place 18 mcg into inhaler and inhale daily.      Vitamin D (Ergocalciferol) 50000 UNITS Caps   Commonly known as: DRISDOL   Take 50,000 Units by mouth every 7 (seven) days. On Mondays         The results of significant diagnostics from this hospitalization (including imaging, microbiology, ancillary and laboratory) are listed below for reference.     Significant Diagnostic Studies: Ct Angio Chest Pe W/cm &/or Wo Cm  02/08/2012  *RADIOLOGY REPORT*  Clinical Data: Sudden onset shortness of breath.  Lower extremity edema.  CT ANGIOGRAPHY CHEST  Technique:  Multidetector CT imaging of the chest using the standard protocol during bolus administration of intravenous contrast. Multiplanar reconstructed images including MIPs were obtained and reviewed to evaluate the vascular anatomy.  Contrast: OMNIPAQUE IOHEXOL 350 MG/ML SOLN  Comparison: 11/24/2011.  Findings: Evaluation for subsegmental pulmonary emboli is somewhat limited by respiratory motion.  No central, lumbar or segmental pulmonary embolus.  No pathologically enlarged mediastinal, hilar or axillary lymph nodes.  Atherosclerotic calcification of the arterial vasculature, including coronary arteries.  Pulmonary arteries are borderline enlarged.  Heart is enlarged.  No pericardial effusion.  Again, respiratory motion degrades image quality upon review of the lungs.  Mild emphysematous changes.  Chronic-appearing volume loss adjacent to the left heart border, within the lingula.  Scarring or atelectasis in the lower lobes, left greater than right.  No pleural fluid.  Airway is unremarkable.  Incidental imaging of the upper abdomen shows an irregular liver margin.  Appear low attenuation in the upper pole right kidney may be artifactual, related to streak artifact from adjacent vertebroplasty, as there is no corresponding abnormality on examination of 11/24/2011 or 06/13/2010.  No worrisome lytic or sclerotic lesions.  Vertebroplasties and old right rib fractures are noted.  C7 compression fracture appears new from 11/24/2011.  There is a new superior endplate compression fracture involving T4. L1 compression fracture is stable.  IMPRESSION:  1.  Evaluation for subsegmental pulmonary emboli is limited by respiratory motion.  No central, lobar or segmental pulmonary embolus. 2.  Mildly irregular liver  margin is indicative of cirrhosis. 3.  New C7 and T4 compression fractures.   Original Report Authenticated By: Reyes Ivan, M.D.    Dg Chest Port 1 View  03/01/2012  *RADIOLOGY REPORT*  Clinical Data: Short of breath.  COPD  PORTABLE CHEST - 1 VIEW  Comparison: 02/08/2012  Findings: Cardiac enlargement without heart failure.  Negative for infiltrate or effusion.  Lungs are clear and without interval change.  Chronic right rib fractures.  Cement vertebroplasty in the thoracic spine at multiple levels.  IMPRESSION: Cardiac enlargement without acute cardiopulmonary disease.   Original Report Authenticated By: Janeece Riggers, M.D.    Dg Chest Portable 1 View  02/08/2012  *RADIOLOGY REPORT*  Clinical Data: Shortness of breath.  Prior history of MI, CHF, stroke, DVT.  Smoker with current history of COPD.  PORTABLE CHEST - 1 VIEW 02/08/2012 1128 hours:  Comparison: Two-view chest x-ray 12/31/2011 [place] and 06/11/2010 [facility].  Portable chest x-ray 12/07/2011 [facility].  Findings: Cardiac silhouette moderately enlarged but stable. Thoracic aorta mildly tortuous and atherosclerotic, unchanged. Hilar and mediastinal contours otherwise unremarkable.  Pulmonary vascularity normal.  Scarring at the left lung base, unchanged.  No new pulmonary parenchymal abnormalities.  Prior augmentation of 3 mid and lower thoracic vertebrae.  IMPRESSION: Stable cardiomegaly.  Scarring at the left lung base.  No acute cardiopulmonary disease.   Original Report Authenticated By: Arnell Sieving, M.D.      Microbiology: No results found for this or any previous visit (from the past 240 hour(s)).   Labs: Basic Metabolic Panel:  Lab 03/04/12 2956 03/03/12 0610 03/02/12 0620 03/01/12 1425  NA 135 133* 138 138  K 4.3 4.0 -- --  CL 94* 91* 95* 92*  CO2 39* 39* 33* --  GLUCOSE 104* 120* 119* 73  BUN 18 16 8  4*  CREATININE 0.74 0.75 0.71 1.10  CALCIUM 8.9 8.9 9.2 --  MG -- -- --  --  PHOS -- -- -- --   Liver Function Tests:  Lab 03/02/12 0620  AST 51*  ALT 30  ALKPHOS 45  BILITOT 0.2*  PROT 5.4*  ALBUMIN 2.6*   No results found for this basename: LIPASE:5,AMYLASE:5 in the last 168 hours No results found for this basename: AMMONIA:5 in the last 168 hours CBC:  Lab 03/02/12 0620 03/01/12 1425 03/01/12 1414  WBC 3.9* -- 6.6  NEUTROABS -- -- 2.9  HGB 10.6* 11.6* 10.9*  HCT 32.2* 34.0* 33.8*  MCV 91.5 -- 91.6  PLT 220 -- 239   Cardiac Enzymes: No results found for this basename: CKTOTAL:5,CKMB:5,CKMBINDEX:5,TROPONINI:5 in the last 168 hours BNP: No components found with this basename: POCBNP:5 CBG:  Lab 03/02/12 0638  GLUCAP 112*    Time coordinating discharge:  Greater than 30 minutes  Signed:  Arvella Massingale, DO [facility] Pager: [phone] 03/04/2012, 10:58 AM

## 2012-03-08 NOTE — Progress Notes (Signed)
Late entry:    CARE MANAGEMENT NOTE 03/08/2012  Patient:  Aimee Sanders, Aimee Sanders   Account Number:  [id number]  Date Initiated:  03/08/2012  Documentation initiated by:  Jakori Burkett  Subjective/Objective Assessment:   Order for [facility]     Action/Plan:   Pt active with [facility] and will continue with that agency   Anticipated DC Date:  03/04/2012   Anticipated DC Plan:  HOME W HOME HEALTH SERVICES         Choice offered to / List presented to:          [facility] arranged  HH-1 RN      [facility] agency  [facility]   Status of service:  Completed, signed off Medicare Important Message given?   (If response is "NO", the following Medicare IM given date fields will be blank) Date Medicare IM given:   Date Additional Medicare IM given:    Discharge Disposition:  HOME W HOME HEALTH SERVICES  Per UR Regulation:    If discussed at Long Length of Stay Meetings, dates discussed:    Comments:

## 2012-06-04 ENCOUNTER — Emergency Department (HOSPITAL_COMMUNITY)
Admission: EM | Admit: 2012-06-04 | Discharge: 2012-06-05 | Disposition: A | Discharge status: Home / Self Care | Payer: PRIVATE HEALTH INSURANCE | Attending: Emergency Medicine | Admitting: Emergency Medicine

## 2012-06-04 ENCOUNTER — Emergency Department (HOSPITAL_COMMUNITY): Payer: PRIVATE HEALTH INSURANCE

## 2012-06-04 ENCOUNTER — Encounter (HOSPITAL_COMMUNITY): Payer: Self-pay | Admitting: *Deleted

## 2012-06-04 DIAGNOSIS — Z9089 Acquired absence of other organs: Secondary | ICD-10-CM | POA: Insufficient documentation

## 2012-06-04 DIAGNOSIS — Z8669 Personal history of other diseases of the nervous system and sense organs: Secondary | ICD-10-CM | POA: Insufficient documentation

## 2012-06-04 DIAGNOSIS — J449 Chronic obstructive pulmonary disease, unspecified: Secondary | ICD-10-CM | POA: Insufficient documentation

## 2012-06-04 DIAGNOSIS — IMO0002 Reserved for concepts with insufficient information to code with codable children: Secondary | ICD-10-CM | POA: Insufficient documentation

## 2012-06-04 DIAGNOSIS — R0602 Shortness of breath: Secondary | ICD-10-CM | POA: Insufficient documentation

## 2012-06-04 DIAGNOSIS — Z86718 Personal history of other venous thrombosis and embolism: Secondary | ICD-10-CM | POA: Insufficient documentation

## 2012-06-04 DIAGNOSIS — R1110 Vomiting, unspecified: Secondary | ICD-10-CM | POA: Insufficient documentation

## 2012-06-04 DIAGNOSIS — I2510 Atherosclerotic heart disease of native coronary artery without angina pectoris: Secondary | ICD-10-CM | POA: Insufficient documentation

## 2012-06-04 DIAGNOSIS — Z79899 Other long term (current) drug therapy: Secondary | ICD-10-CM | POA: Insufficient documentation

## 2012-06-04 DIAGNOSIS — G8929 Other chronic pain: Secondary | ICD-10-CM | POA: Insufficient documentation

## 2012-06-04 DIAGNOSIS — Z8673 Personal history of transient ischemic attack (TIA), and cerebral infarction without residual deficits: Secondary | ICD-10-CM | POA: Insufficient documentation

## 2012-06-04 DIAGNOSIS — J4489 Other specified chronic obstructive pulmonary disease: Secondary | ICD-10-CM | POA: Insufficient documentation

## 2012-06-04 DIAGNOSIS — F17200 Nicotine dependence, unspecified, uncomplicated: Secondary | ICD-10-CM | POA: Insufficient documentation

## 2012-06-04 DIAGNOSIS — I509 Heart failure, unspecified: Secondary | ICD-10-CM | POA: Insufficient documentation

## 2012-06-04 DIAGNOSIS — R109 Unspecified abdominal pain: Secondary | ICD-10-CM | POA: Insufficient documentation

## 2012-06-04 DIAGNOSIS — I252 Old myocardial infarction: Secondary | ICD-10-CM | POA: Insufficient documentation

## 2012-06-04 LAB — CBC WITH DIFFERENTIAL/PLATELET
Eosinophils Absolute: 0.1 10*3/uL (ref 0.0–0.7)
Eosinophils Relative: 1 % (ref 0–5)
Lymphs Abs: 2.9 10*3/uL (ref 0.7–4.0)
MCH: 31 pg (ref 26.0–34.0)
MCHC: 33.8 g/dL (ref 30.0–36.0)
MCV: 91.8 fL (ref 78.0–100.0)
Monocytes Relative: 10 % (ref 3–12)
Platelets: 259 10*3/uL (ref 150–400)
RBC: 3.29 MIL/uL — ABNORMAL LOW (ref 3.87–5.11)

## 2012-06-04 LAB — BASIC METABOLIC PANEL
BUN: 5 mg/dL — ABNORMAL LOW (ref 6–23)
Calcium: 8.7 mg/dL (ref 8.4–10.5)
GFR calc non Af Amer: >90 mL/min (ref >90)
Glucose, Bld: 91 mg/dL (ref 70–99)
Sodium: 129 mEq/L — ABNORMAL LOW (ref 135–145)

## 2012-06-04 LAB — URINALYSIS, ROUTINE W REFLEX MICROSCOPIC
Bilirubin Urine: NEGATIVE
Hgb urine dipstick: NEGATIVE
Protein, ur: NEGATIVE mg/dL
Urobilinogen, UA: 0.2 mg/dL (ref 0.0–1.0)

## 2012-06-04 MED ORDER — ALBUTEROL SULFATE (5 MG/ML) 0.5% IN NEBU
2.5000 mg | INHALATION_SOLUTION | Freq: Once | RESPIRATORY_TRACT | Status: AC
  Administered 2012-06-04: 2.5 mg via RESPIRATORY_TRACT
  Filled 2012-06-04: qty 0.5

## 2012-06-04 MED ORDER — ONDANSETRON HCL 4 MG/2ML IJ SOLN
4.0000 mg | Freq: Once | INTRAMUSCULAR | Status: AC
  Administered 2012-06-04: 4 mg via INTRAVENOUS
  Filled 2012-06-04: qty 2

## 2012-06-04 MED ORDER — SODIUM CHLORIDE 0.9 % IV BOLUS (SEPSIS)
500.0000 mL | Freq: Once | INTRAVENOUS | Status: AC
  Administered 2012-06-04: 500 mL via INTRAVENOUS

## 2012-06-04 MED ORDER — IPRATROPIUM BROMIDE 0.02 % IN SOLN
0.5000 mg | Freq: Once | RESPIRATORY_TRACT | Status: AC
  Administered 2012-06-04: 0.5 mg via RESPIRATORY_TRACT
  Filled 2012-06-04: qty 2.5

## 2012-06-04 MED ORDER — ONDANSETRON HCL 4 MG PO TABS: 4.0000 mg | ORAL_TABLET | Freq: Four times a day (QID) | ORAL | Status: DC

## 2012-06-04 NOTE — ED Notes (Signed)
Ambulated with assistance to bathroom

## 2012-06-04 NOTE — ED Notes (Signed)
2-3+ edema noted to feet bilaterally.  Cap refill ,3 seconds.

## 2012-06-04 NOTE — ED Notes (Addendum)
Pt to department via EMS.  Pt reporting generalized flu symptoms for about 1 week, progressively getting worse.  Pt also c/o abdominal pain upper quadrants.  Worsening edema in lower extremities as well.  Pt also reports that she fell out of bed the other day and thinks she may have injured her ribs left side.

## 2012-06-04 NOTE — ED Notes (Signed)
In to give pt discharge inst and paper work. Pt upset because here ribs were not x-rayed. PA aware and x-ray ordered

## 2012-06-04 NOTE — ED Provider Notes (Signed)
History     CSN: [id number]  Arrival date & time 06/04/12  0505   First MD Initiated Contact with Patient 06/04/12 [phone]      Chief Complaint  Patient presents with  . Abdominal Pain  . Emesis  . Shortness of Breath    (Consider location/radiation/quality/duration/timing/severity/associated sxs/prior treatment) HPI Aimee Sanders is a 67 y.o. female brought in by ambulance, who presents to the Emergency Department complaining of vomiting all day yesterday without diarrhea. No with sore throat, fatigue, and continued vomiting. Denies chest pain, cough, fever, chills.   PCP Dr. Daphine Deutscher  Past Medical History  Diagnosis Date  . Myocardial infarction     times 2  . CHF (congestive heart failure)   . Stroke   . Deep vein thrombosis     Phlebitis  . Hyperlipidemia   . Sleep apnea   . 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 Coronary artery disease   . Chronic pain     due to prior hip fracture and DDD back    Past Surgical History  Procedure Laterality Date  . Appendectomy    . Back surgery    . Cholecystectomy    . Coronary stent placement    . Breast surgery      benign tumors    Family History  Problem Relation Age of Onset  . Hypertension Mother   . Breast cancer Mother   . Rheum arthritis Maternal Grandmother   . Cancer Sister     History  Substance Use Topics  . Smoking status: Current Every Day Smoker -- 1.00 packs/day for 40 years    Types: Cigarettes  . Smokeless tobacco: Not on file     Comment: started smoking in 1969  . Alcohol Use: No    OB History   Grav Para Term Preterm Abortions TAB SAB Ect Mult Living                  Review of Systems  Constitutional: Negative for fever.       10 Systems reviewed and are negative for acute change except as noted in the HPI.  HENT: Negative for congestion.   Eyes: Negative for discharge and redness.  Respiratory: Negative for cough  and shortness of breath.   Cardiovascular: Negative for chest pain.  Gastrointestinal: Positive for vomiting. Negative for abdominal pain.  Musculoskeletal: Negative for back pain.  Skin: Negative for rash.  Neurological: Negative for syncope, numbness and headaches.  Psychiatric/Behavioral:       No behavior change.    Allergies  Aspirin and Penicillins  Home Medications   Current Outpatient Rx  Name  Route  Sig  Dispense  Refill  . albuterol (ACCUNEB) 0.63 MG/3ML nebulizer solution   Nebulization   Take 1 ampule by nebulization every 6 (six) hours as needed. For wheezing         . atorvastatin (LIPITOR) 40 MG tablet   Oral   Take 40 mg by mouth daily.         Marland Kitchen buPROPion (WELLBUTRIN XL) 150 MG 24 hr tablet   Oral   Take 150 mg by mouth daily.         . citalopram (CELEXA) 20 MG tablet   Oral   Take 20 mg by mouth daily.          . clonazePAM (KLONOPIN) 0.5 MG tablet   Oral   Take  1 mg by mouth 3 (three) times daily as needed. For nervousness         . fenofibrate 160 MG tablet   Oral   Take 160 mg by mouth daily.          . Fluticasone-Salmeterol (ADVAIR) 250-50 MCG/DOSE AEPB   Inhalation   Inhale 1 puff into the lungs every 12 (twelve) hours.         . folic acid (FOLVITE) 1 MG tablet   Oral   Take 1 mg by mouth daily.         . furosemide (LASIX) 80 MG tablet   Oral   Take 0.5 tablets (40 mg total) by mouth daily.   30 tablet   3   . gabapentin (NEURONTIN) 300 MG capsule   Oral   Take 300 mg by mouth 3 (three) times daily.         Marland Kitchen ipratropium-albuterol (DUONEB) 0.5-2.5 (3) MG/3ML SOLN   Nebulization   Take 3 mLs by nebulization 3 (three) times daily. For shortness of breath or wheezing   360 mL   3   . isosorbide mononitrate (IMDUR) 30 MG 24 hr tablet   Oral   Take 1 tablet (30 mg total) by mouth daily.   30 tablet   0     Patient needs to call office and schedule an appoi ...   . levofloxacin (LEVAQUIN) 750 MG tablet    Oral   Take 1 tablet (750 mg total) by mouth daily.   4 tablet   0   . metoprolol tartrate (LOPRESSOR) 25 MG tablet   Oral   Take 25 mg by mouth 2 (two) times daily.         . montelukast (SINGULAIR) 10 MG tablet   Oral   Take 10 mg by mouth at bedtime.         . nitroGLYCERIN (NITROSTAT) 0.4 MG SL tablet   Sublingual   Place 0.4 mg under the tongue every 5 (five) minutes as needed. For chest pain         . omeprazole (PRILOSEC) 20 MG capsule   Oral   Take 20 mg by mouth daily.          Marland Kitchen oxyCODONE-acetaminophen (PERCOCET) 10-325 MG per tablet   Oral   Take 1 tablet by mouth every 6 (six) hours as needed for pain. For pain   30 tablet   0   . potassium chloride (K-DUR,KLOR-CON) 10 MEQ tablet   Oral   Take 1 tablet (10 mEq total) by mouth daily.   60 tablet   0   . predniSONE (DELTASONE) 20 MG tablet      Take 60mg  (3 tablets) twice a day x 2 days, then 40mg  (2tablets) twice a day x 2 days, then 20mg  (1 tablet) twice a day x 2 days then take 20mg  (1 tablet) once daily x 2 days   26 tablet   0   . promethazine (PHENERGAN) 25 MG tablet   Oral   Take 0.5-1 tablets (12.5-25 mg total) by mouth every 6 (six) hours as needed for nausea. For nausea   30 tablet   0   . Rivaroxaban (XARELTO) 20 MG TABS   Oral   Take 20 mg by mouth daily.         Marland Kitchen tiotropium (SPIRIVA) 18 MCG inhalation capsule   Inhalation   Place 18 mcg into inhaler and inhale daily.          Marland Kitchen  Vitamin D, Ergocalciferol, (DRISDOL) 50000 UNITS CAPS   Oral   Take 50,000 Units by mouth every 7 (seven) days. On Mondays           BP 109/60  Pulse 101  Temp(Src) 98.5 F (36.9 C) (Oral)  Resp 16  Ht 5\' 4"  (1.626 m)  Wt 130 lb (58.968 kg)  BMI 22.3 kg/m2  SpO2 95%  Physical Exam  Nursing note and vitals reviewed. Constitutional:  Awake, alert, nontoxic appearance.  HENT:  Head: Atraumatic.  Eyes: Right eye exhibits no discharge. Left eye exhibits no discharge.  Neck: Neck  supple.  Cardiovascular: Normal rate.   Pulmonary/Chest: Effort normal and breath sounds normal. She exhibits no tenderness.  Abdominal: Soft. Bowel sounds are normal. There is no tenderness. There is no rebound.  Musculoskeletal: She exhibits no tenderness.  Baseline ROM, no obvious new focal weakness.  Neurological:  Mental status and motor strength appears baseline for patient and situation.  Skin: No rash noted.  Psychiatric: She has a normal mood and affect.    ED Course  Procedures (including critical care time) Results for orders placed during the hospital encounter of 06/04/12  CBC WITH DIFFERENTIAL      Result Value Range   WBC 9.4  4.0 - 10.5 K/uL   RBC 3.29 (*) 3.87 - 5.11 MIL/uL   Hemoglobin 10.2 (*) 12.0 - 15.0 g/dL   HCT 16.1 (*) 09.6 - 04.5 %   MCV 91.8  78.0 - 100.0 fL   MCH 31.0  26.0 - 34.0 pg   MCHC 33.8  30.0 - 36.0 g/dL   RDW 40.9  81.1 - 91.4 %   Platelets 259  150 - 400 K/uL   Neutrophils Relative 58  43 - 77 %   Neutro Abs 5.5  1.7 - 7.7 K/uL   Lymphocytes Relative 31  12 - 46 %   Lymphs Abs 2.9  0.7 - 4.0 K/uL   Monocytes Relative 10  3 - 12 %   Monocytes Absolute 0.9  0.1 - 1.0 K/uL   Eosinophils Relative 1  0 - 5 %   Eosinophils Absolute 0.1  0.0 - 0.7 K/uL   Basophils Relative 0  0 - 1 %   Basophils Absolute 0.0  0.0 - 0.1 K/uL  BASIC METABOLIC PANEL      Result Value Range   Sodium 129 (*) 135 - 145 mEq/L   Potassium 3.7  3.5 - 5.1 mEq/L   Chloride 87 (*) 96 - 112 mEq/L   CO2 36 (*) 19 - 32 mEq/L   Glucose, Bld 91  70 - 99 mg/dL   BUN 5 (*) 6 - 23 mg/dL   Creatinine, Ser 7.82  0.50 - 1.10 mg/dL   Calcium 8.7  8.4 - 95.6 mg/dL   GFR calc non Af Amer >90  >90 mL/min   GFR calc Af Amer >90  >90 mL/min  URINALYSIS, ROUTINE W REFLEX MICROSCOPIC      Result Value Range   Color, Urine YELLOW  YELLOW   APPearance HAZY (*) CLEAR   Specific Gravity, Urine <1.005 (*) 1.005 - 1.030   pH 5.5  5.0 - 8.0   Glucose, UA NEGATIVE  NEGATIVE mg/dL   Hgb  urine dipstick NEGATIVE  NEGATIVE   Bilirubin Urine NEGATIVE  NEGATIVE   Ketones, ur NEGATIVE  NEGATIVE mg/dL   Protein, ur NEGATIVE  NEGATIVE mg/dL   Urobilinogen, UA 0.2  0.0 - 1.0 mg/dL   Nitrite NEGATIVE  NEGATIVE  Leukocytes, UA MODERATE (*) NEGATIVE  URINE MICROSCOPIC-ADD ON      Result Value Range   Squamous Epithelial / LPF FEW (*) RARE   WBC, UA 3-6  <3 WBC/hpf   Bacteria, UA FEW (*) RARE       MDM  Patient with vomiting all day yesterday. Given IVF and zofran here with improvement. Reviewed labs with patient. Pt stable in ED with no significant deterioration in condition.The patient appears reasonably screened and/or stabilized for discharge and I doubt any other medical condition or other [facility] requiring further screening, evaluation, or treatment in the ED at this time prior to discharge.  MDM Reviewed: nursing note and vitals Interpretation: labs           Nicoletta Dress. Colon Branch, MD 06/04/12 [phone]

## 2012-06-04 NOTE — ED Notes (Signed)
Pt reporting improvement in breathing and relieved nausea at this time.  Tolerating p.o intake with no difficulty at this time.

## 2012-06-05 LAB — URINE CULTURE

## 2012-06-22 ENCOUNTER — Emergency Department (HOSPITAL_COMMUNITY): Payer: PRIVATE HEALTH INSURANCE

## 2012-06-22 ENCOUNTER — Inpatient Hospital Stay (HOSPITAL_COMMUNITY)
Admission: EM | Admit: 2012-06-22 | Discharge: 2012-06-28 | DRG: 303 | Disposition: A | Discharge status: Home Health | Payer: PRIVATE HEALTH INSURANCE | Attending: Internal Medicine | Admitting: Internal Medicine

## 2012-06-22 ENCOUNTER — Encounter (HOSPITAL_COMMUNITY): Payer: Self-pay | Admitting: *Deleted

## 2012-06-22 DIAGNOSIS — E785 Hyperlipidemia, unspecified: Secondary | ICD-10-CM | POA: Diagnosis present

## 2012-06-22 DIAGNOSIS — J4489 Other specified chronic obstructive pulmonary disease: Secondary | ICD-10-CM | POA: Diagnosis present

## 2012-06-22 DIAGNOSIS — Z7901 Long term (current) use of anticoagulants: Secondary | ICD-10-CM

## 2012-06-22 DIAGNOSIS — Z9981 Dependence on supplemental oxygen: Secondary | ICD-10-CM

## 2012-06-22 DIAGNOSIS — I200 Unstable angina: Secondary | ICD-10-CM | POA: Diagnosis present

## 2012-06-22 DIAGNOSIS — IMO0002 Reserved for concepts with insufficient information to code with codable children: Secondary | ICD-10-CM | POA: Diagnosis present

## 2012-06-22 DIAGNOSIS — I2510 Atherosclerotic heart disease of native coronary artery without angina pectoris: Principal | ICD-10-CM | POA: Diagnosis present

## 2012-06-22 DIAGNOSIS — E876 Hypokalemia: Secondary | ICD-10-CM | POA: Diagnosis present

## 2012-06-22 DIAGNOSIS — G4733 Obstructive sleep apnea (adult) (pediatric): Secondary | ICD-10-CM | POA: Diagnosis present

## 2012-06-22 DIAGNOSIS — I5023 Acute on chronic systolic (congestive) heart failure: Secondary | ICD-10-CM

## 2012-06-22 DIAGNOSIS — F17200 Nicotine dependence, unspecified, uncomplicated: Secondary | ICD-10-CM | POA: Diagnosis present

## 2012-06-22 DIAGNOSIS — I5042 Chronic combined systolic (congestive) and diastolic (congestive) heart failure: Secondary | ICD-10-CM | POA: Diagnosis present

## 2012-06-22 DIAGNOSIS — I6789 Other cerebrovascular disease: Secondary | ICD-10-CM

## 2012-06-22 DIAGNOSIS — I252 Old myocardial infarction: Secondary | ICD-10-CM

## 2012-06-22 DIAGNOSIS — J9610 Chronic respiratory failure, unspecified whether with hypoxia or hypercapnia: Secondary | ICD-10-CM | POA: Diagnosis present

## 2012-06-22 DIAGNOSIS — G8929 Other chronic pain: Secondary | ICD-10-CM | POA: Diagnosis present

## 2012-06-22 DIAGNOSIS — G4730 Sleep apnea, unspecified: Secondary | ICD-10-CM | POA: Diagnosis present

## 2012-06-22 DIAGNOSIS — R079 Chest pain, unspecified: Secondary | ICD-10-CM

## 2012-06-22 DIAGNOSIS — D649 Anemia, unspecified: Secondary | ICD-10-CM | POA: Diagnosis present

## 2012-06-22 DIAGNOSIS — J441 Chronic obstructive pulmonary disease with (acute) exacerbation: Secondary | ICD-10-CM

## 2012-06-22 DIAGNOSIS — Z8673 Personal history of transient ischemic attack (TIA), and cerebral infarction without residual deficits: Secondary | ICD-10-CM

## 2012-06-22 DIAGNOSIS — I80299 Phlebitis and thrombophlebitis of other deep vessels of unspecified lower extremity: Secondary | ICD-10-CM | POA: Diagnosis present

## 2012-06-22 DIAGNOSIS — I509 Heart failure, unspecified: Secondary | ICD-10-CM | POA: Diagnosis present

## 2012-06-22 DIAGNOSIS — Z96649 Presence of unspecified artificial hip joint: Secondary | ICD-10-CM

## 2012-06-22 DIAGNOSIS — I82509 Chronic embolism and thrombosis of unspecified deep veins of unspecified lower extremity: Secondary | ICD-10-CM | POA: Diagnosis present

## 2012-06-22 HISTORY — DX: Chronic combined systolic (congestive) and diastolic (congestive) heart failure: I50.42

## 2012-06-22 LAB — URINALYSIS, ROUTINE W REFLEX MICROSCOPIC
Glucose, UA: NEGATIVE mg/dL
Hgb urine dipstick: NEGATIVE
Specific Gravity, Urine: 1.004 — ABNORMAL LOW (ref 1.005–1.030)
Urobilinogen, UA: 0.2 mg/dL (ref 0.0–1.0)

## 2012-06-22 LAB — COMPREHENSIVE METABOLIC PANEL
ALT: 12 U/L (ref 0–35)
AST: 28 U/L (ref 0–37)
Calcium: 8.3 mg/dL — ABNORMAL LOW (ref 8.4–10.5)
Sodium: 143 mEq/L (ref 135–145)
Total Protein: 5.4 g/dL — ABNORMAL LOW (ref 6.0–8.3)

## 2012-06-22 LAB — CBC WITH DIFFERENTIAL/PLATELET
Basophils Absolute: 0.1 10*3/uL (ref 0.0–0.1)
Basophils Relative: 1 % (ref 0–1)
Eosinophils Absolute: 0.1 10*3/uL (ref 0.0–0.7)
MCH: 30.2 pg (ref 26.0–34.0)
MCHC: 32.9 g/dL (ref 30.0–36.0)
Neutrophils Relative %: 43 % (ref 43–77)
Platelets: 211 10*3/uL (ref 150–400)
RBC: 3.77 MIL/uL — ABNORMAL LOW (ref 3.87–5.11)
RDW: 14.8 % (ref 11.5–15.5)

## 2012-06-22 LAB — TROPONIN I: Troponin I: <0.3 ng/mL (ref <0.30)

## 2012-06-22 LAB — URINE MICROSCOPIC-ADD ON

## 2012-06-22 MED ORDER — ENOXAPARIN SODIUM 100 MG/ML ~~LOC~~ SOLN: 1.0000 mg/kg | Freq: Once | SUBCUTANEOUS | Status: DC

## 2012-06-22 MED ORDER — IOHEXOL 300 MG/ML  SOLN
50.0000 mL | Freq: Once | INTRAMUSCULAR | Status: AC | PRN
  Administered 2012-06-22: 50 mL via ORAL

## 2012-06-22 MED ORDER — IOHEXOL 350 MG/ML SOLN
100.0000 mL | Freq: Once | INTRAVENOUS | Status: AC | PRN
  Administered 2012-06-22: 100 mL via INTRAVENOUS

## 2012-06-22 MED ORDER — IOHEXOL 300 MG/ML  SOLN
100.0000 mL | Freq: Once | INTRAMUSCULAR | Status: AC | PRN
  Administered 2012-06-22: 100 mL via INTRAVENOUS

## 2012-06-22 MED ORDER — CLOPIDOGREL BISULFATE 75 MG PO TABS
75.0000 mg | ORAL_TABLET | Freq: Once | ORAL | Status: DC
  Filled 2012-06-22: qty 1

## 2012-06-22 MED ORDER — MORPHINE SULFATE 4 MG/ML IJ SOLN
6.0000 mg | Freq: Once | INTRAMUSCULAR | Status: AC
  Administered 2012-06-22: 6 mg via INTRAVENOUS
  Filled 2012-06-22: qty 2

## 2012-06-22 MED ORDER — NITROGLYCERIN 0.4 MG SL SUBL
0.4000 mg | SUBLINGUAL_TABLET | SUBLINGUAL | Status: DC | PRN
  Administered 2012-06-22 (×3): 0.4 mg via SUBLINGUAL
  Filled 2012-06-22: qty 25

## 2012-06-22 MED ORDER — MORPHINE SULFATE 4 MG/ML IJ SOLN
4.0000 mg | Freq: Once | INTRAMUSCULAR | Status: AC
  Administered 2012-06-22: 4 mg via INTRAVENOUS
  Filled 2012-06-22: qty 1

## 2012-06-22 NOTE — ED Notes (Signed)
[id number]<JY> Expected date:<BR> Expected time:<BR> Means of arrival:<BR> Comments:<BR> EMS/SOB

## 2012-06-22 NOTE — ED Notes (Signed)
Pt from home c/o sob and pain below the left breast. Pt with hx of copd, chf. Pt had MI 2009 2010. Pt called ems d/t sob starting this am and pain in left chest started about 1 month ago.

## 2012-06-22 NOTE — Consult Note (Addendum)
Reason for Consult: Chest heaviness and lateral ECG changes Referring Physician: Dr. Sanda Klein is an 67 y.o. female.  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 CP free at time of consultation with initial cardiac biomarkers negative with elevated BNP. She has been followed by Dr. Andee Lineman and his last note in 2012 notes that she had had some chest pain and an ECG that demonstrated dynamic t-wave changes in V3-V6 with subsequent Echo revealing preserved EF and subsequent Cardiolite perfusion tudy with EF 50% and large lateral defect thought consistent with her prior MI and medical treatment preferred/initiated at that time. She tells me she has had chronic back-pain requiring surgery and pain management and some sort of injections - her current back-pain is similar and she deals with this daily. However, she also has noted a chest heaviness sensation brought on with walking around in her home to "get a drink" that has been occuring more frequently. This pressure improves with rest. She has associated dyspnea/shortness of breath which seems stable. Her abdominal pain, left-sided is also present but she focuses on telling me about her back pain and her chest pressure. The Hospitalist team received a report of syncope in the ambulance but I did not obtain this history. There is also report of plavix given because of aspirin allergy and the RN tells me this has not been given on record and we are not currently ordering.     Past Medical History  Diagnosis Date  . Myocardial infarction     times 2  . CHF (congestive heart failure)   . Stroke   . Deep vein thrombosis     Phlebitis  . Hyperlipidemia   . Sleep apnea   . COPD (chronic obstructive pulmonary disease)     02 dep chronically ; HFA 50% p coaching January  24,2012  . Coronary artery disease   . Chronic pain     due to prior hip fracture and DDD back    Past Surgical History  Procedure Laterality Date  . Appendectomy    . Back surgery    . Cholecystectomy    . Coronary stent placement    . Breast surgery      benign tumors    Family History  Problem Relation Age of Onset  . Hypertension Mother   . Breast cancer Mother   . Rheum arthritis Maternal Grandmother   . Cancer Sister     Social History:  reports that she has been smoking Cigarettes.  She has a 40 pack-year smoking history. She does not have any smokeless tobacco history on file. She reports that she does not drink alcohol or use illicit drugs.  Allergies:  Allergies  Allergen Reactions  . Aspirin Other (See Comments)    Son does not remember reaction  . Penicillins Hives and Itching    Medications:  Scheduled: . atorvastatin  40 mg Oral q1800  . citalopram  20 mg Oral Daily  . clopidogrel  75 mg Oral Once  . fenofibrate  160 mg Oral Daily  . furosemide  40 mg Oral BID  . isosorbide mononitrate  30 mg Oral Daily  . metoprolol tartrate  12.5 mg Oral BID  . mometasone-formoterol  2 puff Inhalation BID  . montelukast  10 mg Oral QHS  . pantoprazole  40 mg Oral Daily  . potassium chloride  10 mEq Oral Daily  . sodium chloride  3 mL Intravenous Q12H  . tiotropium  18 mcg Inhalation Daily    Results for orders placed during the hospital encounter of 06/22/12 (from the past 48 hour(s))  PRO B NATRIURETIC PEPTIDE     Status: Abnormal   Collection Time    06/22/12  8:05 PM      Result Value Range   Pro B Natriuretic peptide (BNP) 3562.0 (*) 0 - 125 pg/mL  CBC WITH DIFFERENTIAL     Status: Abnormal   Collection Time    06/22/12  8:05 PM      Result Value Range   WBC 7.9  4.0 - 10.5 K/uL   RBC 3.77 (*) 3.87 - 5.11 MIL/uL   Hemoglobin 11.4 (*) 12.0 - 15.0 g/dL   HCT 16.1 (*) 09.6 - 04.5 %   MCV 91.8  78.0 - 100.0 fL   MCH 30.2  26.0 - 34.0 pg   MCHC 32.9   30.0 - 36.0 g/dL   RDW 40.9  81.1 - 91.4 %   Platelets 211  150 - 400 K/uL   Neutrophils Relative 43  43 - 77 %   Neutro Abs 3.4  1.7 - 7.7 K/uL   Lymphocytes Relative 44  12 - 46 %   Lymphs Abs 3.5  0.7 - 4.0 K/uL   Monocytes Relative 10  3 - 12 %   Monocytes Absolute 0.8  0.1 - 1.0 K/uL   Eosinophils Relative 2  0 - 5 %   Eosinophils Absolute 0.1  0.0 - 0.7 K/uL   Basophils Relative 1  0 - 1 %   Basophils Absolute 0.1  0.0 - 0.1 K/uL  TROPONIN I     Status: None   Collection Time    06/22/12  8:05 PM      Result Value Range   Troponin I <0.30  <0.30 ng/mL   Comment:            Due to the release kinetics of cTnI,     a negative result within the first hours     of the onset of symptoms does not rule out     myocardial infarction with certainty.     If myocardial infarction is still suspected,     repeat the test at appropriate intervals.  COMPREHENSIVE METABOLIC PANEL     Status: Abnormal   Collection Time    06/22/12  8:05 PM      Result Value Range   Sodium 143  135 - 145 mEq/L   Potassium 3.5  3.5 - 5.1 mEq/L   Chloride 102  96 - 112 mEq/L   CO2 34 (*) 19 - 32 mEq/L   Glucose, Bld 92  70 - 99 mg/dL   BUN 7  6 - 23 mg/dL   Creatinine, Ser 7.82  0.50 - 1.10 mg/dL   Calcium 8.3 (*) 8.4 - 10.5 mg/dL   Total Protein 5.4 (*) 6.0 - 8.3 g/dL   Albumin 2.2 (*) 3.5 - 5.2 g/dL   AST 28  0 - 37 U/L   ALT 12  0 - 35 U/L   Alkaline Phosphatase 86  39 - 117 U/L   Total Bilirubin 0.2 (*) 0.3 - 1.2 mg/dL   GFR calc non Af Amer >90  >90 mL/min   GFR calc Af Amer >90  >90 mL/min   Comment:  The eGFR has been calculated     using the CKD EPI equation.     This calculation has not been     validated in all clinical     situations.     eGFR's persistently     <90 mL/min signify     possible Chronic Kidney Disease.  LIPASE, BLOOD     Status: None   Collection Time    06/22/12  8:05 PM      Result Value Range   Lipase 25  11 - 59 U/L  URINALYSIS, ROUTINE W REFLEX  MICROSCOPIC     Status: Abnormal   Collection Time    06/22/12  9:04 PM      Result Value Range   Color, Urine YELLOW  YELLOW   APPearance CLEAR  CLEAR   Specific Gravity, Urine 1.004 (*) 1.005 - 1.030   pH 7.5  5.0 - 8.0   Glucose, UA NEGATIVE  NEGATIVE mg/dL   Hgb urine dipstick NEGATIVE  NEGATIVE   Bilirubin Urine NEGATIVE  NEGATIVE   Ketones, ur NEGATIVE  NEGATIVE mg/dL   Protein, ur NEGATIVE  NEGATIVE mg/dL   Urobilinogen, UA 0.2  0.0 - 1.0 mg/dL   Nitrite NEGATIVE  NEGATIVE   Leukocytes, UA SMALL (*) NEGATIVE  URINE MICROSCOPIC-ADD ON     Status: None   Collection Time    06/22/12  9:04 PM      Result Value Range   Squamous Epithelial / LPF RARE  RARE   WBC, UA 0-2  <3 WBC/hpf    Dg Chest 2 View  06/22/2012  *RADIOLOGY REPORT*  Clinical Data: Shortness of breath, chest pain  CHEST - 2 VIEW  Comparison: 06/04/2012  Findings: Changes of vertebral augmentation in the mid and lower thoracic spine.  Diffuse osteopenia.  Stable compression deformity near the thoracolumbar junction.  Lungs are hyperinflated, clear. Mild cardiomegaly.  No effusion. Vascular clips in the upper abdomen.  IMPRESSION:  1.  Stable cardiomegaly, hyperinflation, and postoperative changes.   Original Report Authenticated By: D. Andria Rhein, MD    Ct Abdomen Pelvis W Contrast  06/22/2012  *RADIOLOGY REPORT*  Clinical Data: Short of breath, muscle pain  CT ABDOMEN AND PELVIS WITH CONTRAST  Technique:  Multidetector CT imaging of the abdomen and pelvis was performed following the standard protocol during bolus administration of intravenous contrast.  Contrast: OMNIPAQUE IOHEXOL 300 MG/ML  SOLN  Comparison: CT 06/13/2010  Findings: Lung bases are clear.  Small pericardial effusion.  No focal hepatic lesion.  There is mild intrahepatic and extrahepatic biliary dilatation likely related to prior cholecystectomy.  The pancreas is atrophic without evidence of acute inflammation.  The spleen, adrenal glands, and kidneys  are unremarkable.  Stomach, small bowel, and cecum are normal.  There is a moderate volume stool throughout the colon.  No retroperitoneal or periportal lymphadenopathy.  Abdominal aorta is normal caliber.  There is no free fluid the pelvis.  Post hysterectomy anatomy. There are diverticula sigmoid colon of acute inflammation.  There are compression fractures at T11, T12 and L1.  The thoracic levels have augmentation.  Findings are unchanged from prior.  IMPRESSION:  1.  No acute findings in the abdomen or pelvis. 2.  Sigmoid diverticulosis of diverticulitis. 3.  Mild intra and extrahepatic biliary duct dilatation likely related to cholecystectomy. 4.  Essentially stable compression fracture of a lower thoracic and upper lumbar spine.   Original Report Authenticated By: Genevive Bi, M.D.     Review of  Systems  Constitutional: Positive for malaise/fatigue. Negative for fever and chills.  HENT: Negative for hearing loss and ear pain.   Eyes: Negative for blurred vision, double vision and photophobia.  Respiratory: Positive for shortness of breath. Negative for hemoptysis and sputum production.   Cardiovascular: Positive for chest pain and orthopnea. Negative for palpitations.       Chest pressure  Gastrointestinal: Negative for heartburn, nausea, vomiting, diarrhea, blood in stool and melena.  Genitourinary: Negative for dysuria, urgency and frequency.  Musculoskeletal: Positive for back pain and joint pain.  Skin: Negative for itching and rash.  Neurological: Negative for dizziness, tingling, tremors and headaches.  Endo/Heme/Allergies: Negative for environmental allergies and polydipsia.  Psychiatric/Behavioral: Negative for depression, suicidal ideas and substance abuse.   Blood pressure 91/57, pulse 101, temperature 98.1 F (36.7 C), temperature source Oral, resp. rate 16, SpO2 100.00%. Physical Exam  Nursing note and vitals reviewed. Constitutional: She is oriented to person, place, and  time. No distress.  Mildly frail appearing woman  HENT:  Head: Normocephalic and atraumatic.  Nose: Nose normal.  Mouth/Throat: Oropharynx is clear and moist. No oropharyngeal exudate.  Eyes: Conjunctivae and EOM are normal. Pupils are equal, round, and reactive to light.  Neck: Normal range of motion. Neck supple. JVD present. No tracheal deviation present.  JVP midneck  Cardiovascular: Normal rate, regular rhythm, normal heart sounds and intact distal pulses.  Exam reveals no gallop.   No murmur heard. Respiratory: No respiratory distress. She has rales.  Scattered rales at bases with moderate air movement  GI: Soft. Bowel sounds are normal. She exhibits no distension. There is no tenderness.  Musculoskeletal: Normal range of motion. She exhibits edema. She exhibits no tenderness.  Trace LEE bilaterally  Neurological: She is alert and oriented to person, place, and time. No cranial nerve deficit. Coordination normal.  Skin: Skin is warm and dry. No rash noted. She is not diaphoretic. No erythema.  Psychiatric: She has a normal mood and affect. Her behavior is normal.    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 Margarita Mail commentary from prior note). Initial biomarkers negative.  Differential is musculoskeletal, ACS, indigestion, abdominal pain etiology, pneumothorax and even congestive heart failure among others. However, given her risk factors, ECG changes and pressure quality of pain worse with exertion and relieved with rest makes angina and CAD more concerning. I have asked pharmacy to evaluate her listed aspirin allergy. She tells me she is not allergic to aspirin but she takes a different pill for her blood clots. She denies history of anaphylaxis or intubation. For now, I favor starting heparin gtt with pharmacy assistance. Continue NPO. Give aspirin if able, otherwise consider plavix. We will consider nuclear stress test vs. LHC. Previously, she has had stents (no history of anatomy but Dr. Margarita Mail note state chronic RCA disease). She does appear  chronically ill but the coexistence of DVTs requiring indefinite anticoagulation will make drug treatment choices more challenging - triple therapy vs. Asa/plavix/warfarin vs. Plavix/riva, etc with chart mention of aspirin allergy. Finish trending troponins and assess pain later this morning.  - NPO  - pharmacy to help sort out aspirin allergy, would prefer to give aspirin 324 mg then 81 mg daily unless they obtain different history - heparin gtt per pharmacy; hold rivaroxaban - metoprolol, atorva as home dosing - trend troponins, telemetry - repeat ECG for change in symptoms - update lipid panel, tsh, hba1c - consider gentle diuresis with 10-20 mg IV lasix  Jordanny Waddington 06/22/2012, 11:49 PM

## 2012-06-22 NOTE — ED Provider Notes (Addendum)
History     CSN: [id number]  Arrival date & time 06/22/12  1903   First MD Initiated Contact with Patient 06/22/12 1945      Chief Complaint  Patient presents with  . Shortness of Breath  . Muscle Pain     HPI The patient has a history of coronary artery disease and stent in her heart per the patient.  She presents with several weeks of left-sided abdominal pain with nausea.  She denies vomiting.  Her pain became more severe today that she called EMS.  Just before EMS arrived she developed a pressure sensation in her chest with associated shortness of breath.  There is no radiation of her pain.  Her pain was mild to moderate at that time.  She was transported emergency department.  She reports that she still has some pressure in her chest but that is improved somewhat since EMS arrived.  There is no treatment given by EMS.  Regarding her left-sided abdominal pain she states has been there for some time and does not seem to be improving with her symptoms at home.  She has a history of thromboembolism in the past and she is on xarelto for this.  She has a history of hyperlipidemia, COPD, chronic pain, stroke, congestive heart failure.  Shows ejection fraction of 40-45%.  She is on Imdur and has nitroglycerin at home.  She did not try any nitroglycerin at home.  She is also on 80 mg of Lasix daily   Past Medical History  Diagnosis Date  . Myocardial infarction     times 2  . CHF (congestive heart failure)   . Stroke   . Deep vein thrombosis     Phlebitis  . Hyperlipidemia   . Sleep apnea   . 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 Coronary artery disease   . Chronic pain     due to prior hip fracture and DDD back    Past Surgical History  Procedure Laterality Date  . Appendectomy    . Back surgery    . Cholecystectomy    . Coronary stent placement    . Breast surgery      benign tumors    Family History   Problem Relation Age of Onset  . Hypertension Mother   . Breast cancer Mother   . Rheum arthritis Maternal Grandmother   . Cancer Sister     History  Substance Use Topics  . Smoking status: Current Every Day Smoker -- 1.00 packs/day for 40 years    Types: Cigarettes  . Smokeless tobacco: Not on file     Comment: started smoking in 1969  . Alcohol Use: No    OB History   Grav Para Term Preterm Abortions TAB SAB Ect Mult Living                  Review of Systems  All other systems reviewed and are negative.    Allergies  Aspirin and Penicillins  Home Medications   Current Outpatient Rx  Name  Route  Sig  Dispense  Refill  . albuterol (ACCUNEB) 0.63 MG/3ML nebulizer solution   Nebulization   Take 1 ampule by nebulization every 6 (six) hours as needed. For wheezing         . atorvastatin (LIPITOR) 40 MG tablet   Oral   Take 40 mg by mouth daily.         Marland Kitchen  citalopram (CELEXA) 20 MG tablet   Oral   Take 20 mg by mouth daily.          . clonazePAM (KLONOPIN) 0.5 MG tablet   Oral   Take 1 mg by mouth 3 (three) times daily as needed. For nervousness         . fenofibrate 160 MG tablet   Oral   Take 160 mg by mouth daily.          . Fluticasone-Salmeterol (ADVAIR) 250-50 MCG/DOSE AEPB   Inhalation   Inhale 1 puff into the lungs every 12 (twelve) hours.         . folic acid (FOLVITE) 1 MG tablet   Oral   Take 1 mg by mouth daily.         . furosemide (LASIX) 80 MG tablet   Oral   Take 40 mg by mouth 2 (two) times daily.         Marland Kitchen ipratropium-albuterol (DUONEB) 0.5-2.5 (3) MG/3ML SOLN   Nebulization   Take 3 mLs by nebulization 3 (three) times daily. For shortness of breath or wheezing   360 mL   3   . isosorbide mononitrate (IMDUR) 30 MG 24 hr tablet   Oral   Take 1 tablet (30 mg total) by mouth daily.   30 tablet   0     Patient needs to call office and schedule an appoi ...   . levofloxacin (LEVAQUIN) 750 MG tablet   Oral    Take 1 tablet (750 mg total) by mouth daily.   4 tablet   0   . metoprolol tartrate (LOPRESSOR) 25 MG tablet   Oral   Take 25 mg by mouth 2 (two) times daily.         . montelukast (SINGULAIR) 10 MG tablet   Oral   Take 10 mg by mouth at bedtime.         Marland Kitchen omeprazole (PRILOSEC) 20 MG capsule   Oral   Take 20 mg by mouth daily.          . ondansetron (ZOFRAN) 4 MG tablet   Oral   Take 1 tablet (4 mg total) by mouth every 6 (six) hours.   12 tablet   0   . oxyCODONE-acetaminophen (PERCOCET) 10-325 MG per tablet   Oral   Take 1 tablet by mouth every 6 (six) hours as needed for pain. For pain   30 tablet   0   . potassium chloride (K-DUR,KLOR-CON) 10 MEQ tablet   Oral   Take 1 tablet (10 mEq total) by mouth daily.   60 tablet   0   . promethazine (PHENERGAN) 25 MG tablet   Oral   Take 0.5-1 tablets (12.5-25 mg total) by mouth every 6 (six) hours as needed for nausea. For nausea   30 tablet   0   . Rivaroxaban (XARELTO) 20 MG TABS   Oral   Take 20 mg by mouth daily.         Marland Kitchen tiotropium (SPIRIVA) 18 MCG inhalation capsule   Inhalation   Place 18 mcg into inhaler and inhale daily.          . nitroGLYCERIN (NITROSTAT) 0.4 MG SL tablet   Sublingual   Place 0.4 mg under the tongue every 5 (five) minutes as needed. For chest pain           BP 116/60  Pulse 94  Temp(Src) 98.1 F (36.7 C) (Oral)  SpO2 100%  Physical Exam  Nursing note and vitals reviewed. Constitutional: She is oriented to person, place, and time. She appears well-developed and well-nourished. No distress.  HENT:  Head: Normocephalic and atraumatic.  Eyes: EOM are normal.  Neck: Normal range of motion.  Cardiovascular: Normal rate, regular rhythm and normal heart sounds.   Pulmonary/Chest: Effort normal and breath sounds normal.  Abdominal: Soft. She exhibits no distension.  Left sided abdominal tenderness without guarding or rebound  Musculoskeletal: Normal range of motion.   Neurological: She is alert and oriented to person, place, and time.  Skin: Skin is warm and dry.  Psychiatric: She has a normal mood and affect. Judgment normal.    ED Course  Procedures (including critical care time)   Date: 06/22/2012  2011  Rate: 82   Rhythm: normal sinus rhythm  QRS Axis: normal  Intervals: normal  ST/T Wave abnormalities: Lateral T wave inversions of V3 through V6 as well as inverted T waves in II and aVF   Conduction Disutrbances: none  Narrative Interpretation:   Old EKG Reviewed: Change from prior EKG with new inferolateral T-wave inversion     Date: 06/22/2012  Rate: 93  Rhythm: normal sinus rhythm  QRS Axis: normal  Intervals: normal  ST/T Wave abnormalities: Deep Lateral T wave inversions   Conduction Disutrbances: none  Narrative Interpretation:   Old EKG Reviewed: No significant changes noted from EKG earlier tonight   CRITICAL CARE Performed by: Lyanne Co Total critical care time: 32 Critical care time was exclusive of separately billable procedures and treating other patients. Critical care was necessary to treat or prevent imminent or life-threatening deterioration. Critical care was time spent personally by me on the following activities: development of treatment plan with patient and/or surrogate as well as nursing, discussions with consultants, evaluation of patient's response to treatment, examination of patient, obtaining history from patient or surrogate, ordering and performing treatments and interventions, ordering and review of laboratory studies, ordering and review of radiographic studies, pulse oximetry and re-evaluation of patient's condition.    Labs Reviewed  PRO B NATRIURETIC PEPTIDE - Abnormal; Notable for the following:    Pro B Natriuretic peptide (BNP) 3562.0 (*)    All other components within normal limits  CBC WITH DIFFERENTIAL - Abnormal; Notable for the following:    RBC 3.77 (*)    Hemoglobin 11.4 (*)     HCT 34.6 (*)    All other components within normal limits  COMPREHENSIVE METABOLIC PANEL - Abnormal; Notable for the following:    CO2 34 (*)    Calcium 8.3 (*)    Total Protein 5.4 (*)    Albumin 2.2 (*)    Total Bilirubin 0.2 (*)    All other components within normal limits  URINALYSIS, ROUTINE W REFLEX MICROSCOPIC - Abnormal; Notable for the following:    Specific Gravity, Urine 1.004 (*)    Leukocytes, UA SMALL (*)    All other components within normal limits  TROPONIN I  LIPASE, BLOOD  URINE MICROSCOPIC-ADD ON  TROPONIN I   Dg Chest 2 View  06/22/2012  *RADIOLOGY REPORT*  Clinical Data: Shortness of breath, chest pain  CHEST - 2 VIEW  Comparison: 06/04/2012  Findings: Changes of vertebral augmentation in the mid and lower thoracic spine.  Diffuse osteopenia.  Stable compression deformity near the thoracolumbar junction.  Lungs are hyperinflated, clear. Mild cardiomegaly.  No effusion. Vascular clips in the upper abdomen.  IMPRESSION:  1.  Stable cardiomegaly, hyperinflation, and postoperative changes.  Original Report Authenticated By: D. Andria Rhein, MD    Ct Abdomen Pelvis W Contrast  06/22/2012  *RADIOLOGY REPORT*  Clinical Data: Short of breath, muscle pain  CT ABDOMEN AND PELVIS WITH CONTRAST  Technique:  Multidetector CT imaging of the abdomen and pelvis was performed following the standard protocol during bolus administration of intravenous contrast.  Contrast: OMNIPAQUE IOHEXOL 300 MG/ML  SOLN  Comparison: CT 06/13/2010  Findings: Lung bases are clear.  Small pericardial effusion.  No focal hepatic lesion.  There is mild intrahepatic and extrahepatic biliary dilatation likely related to prior cholecystectomy.  The pancreas is atrophic without evidence of acute inflammation.  The spleen, adrenal glands, and kidneys are unremarkable.  Stomach, small bowel, and cecum are normal.  There is a moderate volume stool throughout the colon.  No retroperitoneal or periportal  lymphadenopathy.  Abdominal aorta is normal caliber.  There is no free fluid the pelvis.  Post hysterectomy anatomy. There are diverticula sigmoid colon of acute inflammation.  There are compression fractures at T11, T12 and L1.  The thoracic levels have augmentation.  Findings are unchanged from prior.  IMPRESSION:  1.  No acute findings in the abdomen or pelvis. 2.  Sigmoid diverticulosis of diverticulitis. 3.  Mild intra and extrahepatic biliary duct dilatation likely related to cholecystectomy. 4.  Essentially stable compression fracture of a lower thoracic and upper lumbar spine.   Original Report Authenticated By: Genevive Bi, M.D.    I personally reviewed the imaging tests through PACS system I reviewed available ER/hospitalization records through the EMR   1. Chest pain   2. Abdominal pain   3. Abnormal ECG       MDM  The patient presents with very atypical symptoms.  Her EKG is concerning for ACS with lateral T wave inversions.  These are present also on EMS images.  The patient has had relief of her chest discomfort nitroglycerin.  She has an aspirin allergy therefore she'll be given a dose of Plavix.  Chest x-ray without acute findings.  CT abdomen pelvis demonstrate no acute findings to suggest a cause for her left-sided abdominal pain.  The story is more concerning is the development of chest pressure prior to EMS arrival.  I discussed her care in case with cardiology who agrees the patient will need to be admitted to the hospital for abnormal EKG but requests that the patient be placed on the hospitalist service given the atypical nature of her story.  She does have a history of known coronary disease therefore think it's important that the patient be transferred to [facility] as she may require additional testing that is not available at this institution.  Dose of Lovenox now.  Pain free now.  Repeat /serial EKG without changes from initial EKG. Repeat troponin  pending.        Lyanne Co, MD 06/22/12 2353  The pt is on xarelto with her last dose at 6pm, she will not need lovenox at this time. Will hold. No active pain at this time  Lyanne Co, MD 06/22/12 2354

## 2012-06-22 NOTE — H&P (Signed)
History and Physical  Aimee Sanders WGN:[id number] DOB: 05-28-1945 DOA: 06/22/2012  Referring physician: Azalia Bilis, MD PCP: Bennie Pierini, FNP  [facility]  Chief Complaint: Chest pain  HPI:  67 year old woman PMH including CAD presents the emergency department with multiple complaints including left-sided chest pain and shortness of breath. Initial troponin was negative but EKG demonstrated diffuse T-wave inversion concerning for ischemia and patient was treated for unstable angina, cardiology consulted and recommendation to transfer to [facility] for further evaluation was made.  History obtained from patient and daughters at bedside. Multiple complaints. Patient reports constant chest pain for the last month with no aggravating or alleviating factors. The pain is located on the left side of her chest and left upper abdomen. She reports associated increased shortness of breath with this, no radiation to her neck or jaw. She did have some radiation to her arm earlier today which was short lived. No diaphoresis. She also complains of bilateral lower extremity edema present for some time, poor oral intake and decreased urine output. Also reported several week history of left-sided abdominal pain with nausea, at times abdomen very firm. Left-sided abdominal pain worsened today and so she sought further evaluation. Just before arrival of EMS patient developed a pressure-like sensation in her chest with associated shortness of breath. She reports syncope while laying down in the ambulance.  In ED noted to be afebrile, vitals stable. Troponin negative x2. CXR--no acute changes. CT abdomen/pelvis-- no acute findings. EKG abnormal with diffuse T-wave inversion consider anterolateral and inferior ischemia. Given nitroglycerin, Plavix, morphine. She currently feels better.  Review of Systems:  Negative for fever, visual changes, rash, new muscle aches,  dysuria, bleeding, n/v.  Positive for  sore throat  Past Medical History  Diagnosis Date  . Myocardial infarction     times 2  . CHF (congestive heart failure)   . Stroke   . Deep vein thrombosis     Phlebitis  . Hyperlipidemia   . Sleep apnea   . 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 Coronary artery disease   . Chronic pain     due to prior hip fracture and DDD back    Past Surgical History  Procedure Laterality Date  . Appendectomy    . Back surgery    . Cholecystectomy    . Coronary stent placement    . Breast surgery      benign tumors  . Carpal tunnel release    . Back surgery    . Right hip replacement      Social History:  reports that she has been smoking Cigarettes.  She has a 40 pack-year smoking history. She does not have any smokeless tobacco history on file. She reports that she does not drink alcohol or use illicit drugs.  Allergies  Allergen Reactions  . Aspirin Other (See Comments)    Son does not remember reaction  . Penicillins Hives and Itching    Family History  Problem Relation Age of Onset  . Hypertension Mother   . Breast cancer Mother   . Rheum arthritis Maternal Grandmother   . Cancer Sister      Prior to Admission medications   Medication Sig Start Date End Date Taking? Authorizing Provider  albuterol (ACCUNEB) 0.63 MG/3ML nebulizer solution Take 1 ampule by nebulization every 6 (six) hours as needed. For wheezing   Yes Historical Provider, MD  atorvastatin (LIPITOR)  40 MG tablet Take 40 mg by mouth daily.   Yes Historical Provider, MD  citalopram (CELEXA) 20 MG tablet Take 20 mg by mouth daily.    Yes Historical Provider, MD  clonazePAM (KLONOPIN) 0.5 MG tablet Take 1 mg by mouth 3 (three) times daily as needed. For nervousness   Yes Historical Provider, MD  fenofibrate 160 MG tablet Take 160 mg by mouth daily.    Yes Historical Provider, MD  Fluticasone-Salmeterol (ADVAIR) 250-50 MCG/DOSE AEPB Inhale 1  puff into the lungs every 12 (twelve) hours.   Yes Historical Provider, MD  folic acid (FOLVITE) 1 MG tablet Take 1 mg by mouth daily.   Yes Historical Provider, MD  furosemide (LASIX) 80 MG tablet Take 40 mg by mouth 2 (two) times daily. 02/11/12  Yes Ripudeep K Rai, MD  ipratropium-albuterol (DUONEB) 0.5-2.5 (3) MG/3ML SOLN Take 3 mLs by nebulization 3 (three) times daily. For shortness of breath or wheezing 02/11/12  Yes Ripudeep Jenna Luo, MD  isosorbide mononitrate (IMDUR) 30 MG 24 hr tablet Take 1 tablet (30 mg total) by mouth daily. 03/04/12  Yes Prescott Parma, PA-C  levofloxacin (LEVAQUIN) 750 MG tablet Take 1 tablet (750 mg total) by mouth daily. 03/04/12  Yes Catarina Hartshorn, MD  metoprolol tartrate (LOPRESSOR) 25 MG tablet Take 25 mg by mouth 2 (two) times daily.   Yes Historical Provider, MD  montelukast (SINGULAIR) 10 MG tablet Take 10 mg by mouth at bedtime.   Yes Historical Provider, MD  omeprazole (PRILOSEC) 20 MG capsule Take 20 mg by mouth daily.    Yes Historical Provider, MD  ondansetron (ZOFRAN) 4 MG tablet Take 1 tablet (4 mg total) by mouth every 6 (six) hours. 06/04/12  Yes Nicoletta Dress. Colon Branch, MD  oxyCODONE-acetaminophen (PERCOCET) 10-325 MG per tablet Take 1 tablet by mouth every 6 (six) hours as needed for pain. For pain 02/11/12  Yes Ripudeep K Rai, MD  potassium chloride (K-DUR,KLOR-CON) 10 MEQ tablet Take 1 tablet (10 mEq total) by mouth daily. 02/11/12 02/10/13 Yes Ripudeep Jenna Luo, MD  promethazine (PHENERGAN) 25 MG tablet Take 0.5-1 tablets (12.5-25 mg total) by mouth every 6 (six) hours as needed for nausea. For nausea 01/04/12  Yes Meredeth Ide, MD  Rivaroxaban (XARELTO) 20 MG TABS Take 20 mg by mouth daily.   Yes Historical Provider, MD  tiotropium (SPIRIVA) 18 MCG inhalation capsule Place 18 mcg into inhaler and inhale daily.    Yes Historical Provider, MD  nitroGLYCERIN (NITROSTAT) 0.4 MG SL tablet Place 0.4 mg under the tongue every 5 (five) minutes as needed. For chest pain     Historical Provider, MD   Physical Exam: Filed Vitals:   06/22/12 2204 06/22/12 2330 06/22/12 2338 06/22/12 2345  BP: 116/60 103/53 99/35 91/57   Pulse: 94 94 101 101  Temp:      TempSrc:      Resp:   16 16  SpO2: 100% 99% 95% 100%    General:  Examined in the emergency department. Appears calm and comfortable. Participates fully history and examination. Nontoxic. Eyes: PERRL, normal lids, irises ENT: grossly normal hearing, lips & tongue Neck: no LAD, masses or thyromegaly Cardiovascular: RRR, no m/r/g. 2+ bilateral LE edema. Respiratory: CTA bilaterally, no w/r/r. Decreased breath sounds. Normal respiratory effort. Abdomen: soft, nondistended. Generally tender. Well-healed laparotomy incision with small incisional hernia in the upper abdomen, nontender. Ventral hernia left-sided also noted, nontender, both easily reducible. Skin: no rash or induration seen  Musculoskeletal: grossly normal tone BUE/BLE Psychiatric:  grossly normal mood and affect, speech fluent and appropriate Neurologic: grossly non-focal.  Wt Readings from Last 3 Encounters:  06/04/12 58.968 kg (130 lb)  03/04/12 59.376 kg (130 lb 14.4 oz)  02/11/12 58.741 kg (129 lb 8 oz)    Labs on Admission:  Basic Metabolic Panel:  Recent Labs Lab 06/22/12 2005  NA 143  K 3.5  CL 102  CO2 34*  GLUCOSE 92  BUN 7  CREATININE 0.57  CALCIUM 8.3*    Liver Function Tests:  Recent Labs Lab 06/22/12 2005  AST 28  ALT 12  ALKPHOS 86  BILITOT 0.2*  PROT 5.4*  ALBUMIN 2.2*    Recent Labs Lab 06/22/12 2005  LIPASE 25    CBC:  Recent Labs Lab 06/22/12 2005  WBC 7.9  NEUTROABS 3.4  HGB 11.4*  HCT 34.6*  MCV 91.8  PLT 211    Cardiac Enzymes:  Recent Labs Lab 06/22/12 2005 06/22/12 2318  TROPONINI <0.30 <0.30    Recent Labs  01/01/12 1612 02/08/12 1121 06/22/12 2005  PROBNP 2063.0* 1958.0* 3562.0*    Radiological Exams on Admission: Dg Chest 2 View  06/22/2012  *RADIOLOGY REPORT*   Clinical Data: Shortness of breath, chest pain  CHEST - 2 VIEW  Comparison: 06/04/2012  Findings: Changes of vertebral augmentation in the mid and lower thoracic spine.  Diffuse osteopenia.  Stable compression deformity near the thoracolumbar junction.  Lungs are hyperinflated, clear. Mild cardiomegaly.  No effusion. Vascular clips in the upper abdomen.  IMPRESSION:  1.  Stable cardiomegaly, hyperinflation, and postoperative changes.   Original Report Authenticated By: D. Andria Rhein, MD    Ct Abdomen Pelvis W Contrast  06/22/2012  *RADIOLOGY REPORT*  Clinical Data: Short of breath, muscle pain  CT ABDOMEN AND PELVIS WITH CONTRAST  Technique:  Multidetector CT imaging of the abdomen and pelvis was performed following the standard protocol during bolus administration of intravenous contrast.  Contrast: OMNIPAQUE IOHEXOL 300 MG/ML  SOLN  Comparison: CT 06/13/2010  Findings: Lung bases are clear.  Small pericardial effusion.  No focal hepatic lesion.  There is mild intrahepatic and extrahepatic biliary dilatation likely related to prior cholecystectomy.  The pancreas is atrophic without evidence of acute inflammation.  The spleen, adrenal glands, and kidneys are unremarkable.  Stomach, small bowel, and cecum are normal.  There is a moderate volume stool throughout the colon.  No retroperitoneal or periportal lymphadenopathy.  Abdominal aorta is normal caliber.  There is no free fluid the pelvis.  Post hysterectomy anatomy. There are diverticula sigmoid colon of acute inflammation.  There are compression fractures at T11, T12 and L1.  The thoracic levels have augmentation.  Findings are unchanged from prior.  IMPRESSION:  1.  No acute findings in the abdomen or pelvis. 2.  Sigmoid diverticulosis of diverticulitis. 3.  Mild intra and extrahepatic biliary duct dilatation likely related to cholecystectomy. 4.  Essentially stable compression fracture of a lower thoracic and upper lumbar spine.   Original Report  Authenticated By: Genevive Bi, M.D.     EKG: Independently reviewed.  3/5 2011: SR, LAD, anterolateral and inferior T-wave inversion 3/5 2331: SR, anterolateral T-wave inversion. Inferior T-wave inversion resolved.  Compared to 03/01/2012 T-wave inversion is new  Principal Problem:   Unstable angina Active Problems:   SMOKER   DEEP VEIN THROMBOSIS/PHLEBITIS   RESPIRATORY FAILURE, CHRONIC   SLEEP APNEA   Chronic systolic heart failure   COPD (chronic obstructive pulmonary disease)   Assessment/Plan 1. [place] with EKG changes,  known CAD: Cardiac enzymes negative thus far. Although pain reported to be chronic, her history of increased pain today and EKG changes are concerning for ischemia. On Xarelto, last dose 3/5 at 6 PM. Therefore Lovenox was not given. Allergic to aspirin, given Plavix. Cardiology recommended transfer to [facility] at which point patient will be seen in consultation. Continue Imdur, statin; beta blocker if blood pressure can tolerate. 2. Left-sided abdominal pain?: Exam benign. CT abdomen and pelvis unremarkable. Suspect referred pain. 3. History of systolic CHF, appears compensated: 11/2011 ejection fraction of 40-45%, inferolateral hypokinesis. Continue Lasix. 4. Oxygen-dependent COPD, chronic respiratory failure: Appears stable. Continue Advair, Spiriva, nebulizer treatments, Singulair. 5. History of DVT: On Xarelto as outpatient, on hold pending further evaluation, will need DVT prophylaxis by evening 3/6. History of intravenous streptokinase. 6. History of stroke 7. OSA: Cannot tolerate CPAP. 8. Ongoing cigarette use: Smoking cessation counseling this hospitalization.  Code Status: Full code Family Communication: Discussed with 2 daughters at bedside Disposition Plan/Anticipated LOS: Admit to step down, 2-4 days.  Time spent: 50 minutes  Brendia Sacks, MD  [facility] Pager [phone] 06/22/2012, 11:51 PM

## 2012-06-23 ENCOUNTER — Encounter (HOSPITAL_COMMUNITY): Payer: Self-pay | Admitting: Family Medicine

## 2012-06-23 DIAGNOSIS — I200 Unstable angina: Secondary | ICD-10-CM

## 2012-06-23 DIAGNOSIS — R079 Chest pain, unspecified: Secondary | ICD-10-CM

## 2012-06-23 LAB — TROPONIN I
Troponin I: <0.3 ng/mL (ref <0.30)
Troponin I: <0.3 ng/mL (ref <0.30)

## 2012-06-23 LAB — LIPID PANEL
Cholesterol: 113 mg/dL (ref 0–200)
LDL Cholesterol: 23 mg/dL (ref 0–99)
Triglycerides: 101 mg/dL (ref <150)
VLDL: 20 mg/dL (ref 0–40)

## 2012-06-23 LAB — MRSA PCR SCREENING: MRSA by PCR: POSITIVE — AB

## 2012-06-23 MED ORDER — HEPARIN (PORCINE) IN NACL 100-0.45 UNIT/ML-% IJ SOLN
900.0000 [IU]/h | INTRAMUSCULAR | Status: DC
  Administered 2012-06-23: 18:00:00 900 [IU]/h via INTRAVENOUS
  Filled 2012-06-23 (×2): qty 250

## 2012-06-23 MED ORDER — SODIUM CHLORIDE 0.9 % IV SOLN
250.0000 mL | INTRAVENOUS | Status: DC | PRN
  Administered 2012-06-23 – 2012-06-24 (×2): 250 mL via INTRAVENOUS

## 2012-06-23 MED ORDER — FENOFIBRATE 160 MG PO TABS
160.0000 mg | ORAL_TABLET | Freq: Every day | ORAL | Status: DC
  Administered 2012-06-23 – 2012-06-28 (×6): 160 mg via ORAL
  Filled 2012-06-23 (×6): qty 1

## 2012-06-23 MED ORDER — LORAZEPAM 0.5 MG PO TABS
0.5000 mg | ORAL_TABLET | Freq: Four times a day (QID) | ORAL | Status: DC
  Administered 2012-06-23 – 2012-06-28 (×20): 0.5 mg via ORAL
  Filled 2012-06-23 (×20): qty 1

## 2012-06-23 MED ORDER — FUROSEMIDE 40 MG PO TABS
40.0000 mg | ORAL_TABLET | Freq: Two times a day (BID) | ORAL | Status: DC
  Administered 2012-06-23 – 2012-06-25 (×6): 40 mg via ORAL
  Filled 2012-06-23 (×9): qty 1

## 2012-06-23 MED ORDER — ASPIRIN 325 MG PO TABS: 325.0000 mg | ORAL_TABLET | Freq: Every day | ORAL | Status: DC

## 2012-06-23 MED ORDER — MOMETASONE FURO-FORMOTEROL FUM 100-5 MCG/ACT IN AERO
2.0000 | INHALATION_SPRAY | Freq: Two times a day (BID) | RESPIRATORY_TRACT | Status: DC
  Administered 2012-06-23 – 2012-06-28 (×11): 2 via RESPIRATORY_TRACT
  Filled 2012-06-23: qty 8.8

## 2012-06-23 MED ORDER — ISOSORBIDE MONONITRATE ER 30 MG PO TB24
30.0000 mg | ORAL_TABLET | Freq: Every day | ORAL | Status: DC
  Administered 2012-06-23 – 2012-06-28 (×5): 30 mg via ORAL
  Filled 2012-06-23 (×6): qty 1

## 2012-06-23 MED ORDER — MONTELUKAST SODIUM 10 MG PO TABS
10.0000 mg | ORAL_TABLET | Freq: Every day | ORAL | Status: DC
  Administered 2012-06-23 – 2012-06-27 (×5): 10 mg via ORAL
  Filled 2012-06-23 (×7): qty 1

## 2012-06-23 MED ORDER — ONDANSETRON HCL 4 MG/2ML IJ SOLN
4.0000 mg | Freq: Four times a day (QID) | INTRAMUSCULAR | Status: DC | PRN
  Administered 2012-06-23 – 2012-06-26 (×7): 4 mg via INTRAVENOUS
  Filled 2012-06-23 (×8): qty 2

## 2012-06-23 MED ORDER — SODIUM CHLORIDE 0.9 % IJ SOLN
3.0000 mL | Freq: Two times a day (BID) | INTRAMUSCULAR | Status: DC
  Administered 2012-06-23 – 2012-06-27 (×9): 3 mL via INTRAVENOUS

## 2012-06-23 MED ORDER — ATORVASTATIN CALCIUM 40 MG PO TABS
40.0000 mg | ORAL_TABLET | Freq: Every day | ORAL | Status: DC
  Administered 2012-06-23 – 2012-06-27 (×4): 40 mg via ORAL
  Filled 2012-06-23 (×6): qty 1

## 2012-06-23 MED ORDER — CLONAZEPAM 0.5 MG PO TABS: 1.0000 mg | ORAL_TABLET | Freq: Three times a day (TID) | ORAL | Status: DC | PRN

## 2012-06-23 MED ORDER — ASPIRIN EC 81 MG PO TBEC
81.0000 mg | DELAYED_RELEASE_TABLET | Freq: Every day | ORAL | Status: DC
  Administered 2012-06-24 – 2012-06-28 (×5): 81 mg via ORAL
  Filled 2012-06-23 (×5): qty 1

## 2012-06-23 MED ORDER — CHLORHEXIDINE GLUCONATE CLOTH 2 % EX PADS
6.0000 | MEDICATED_PAD | Freq: Every day | CUTANEOUS | Status: AC
  Administered 2012-06-24 – 2012-06-27 (×3): 6 via TOPICAL

## 2012-06-23 MED ORDER — REGADENOSON 0.4 MG/5ML IV SOLN
0.4000 mg | Freq: Once | INTRAVENOUS | Status: AC
  Filled 2012-06-23: qty 5

## 2012-06-23 MED ORDER — ASPIRIN 81 MG PO CHEW: 81.0000 mg | CHEWABLE_TABLET | Freq: Every day | ORAL | Status: DC

## 2012-06-23 MED ORDER — MUPIROCIN 2 % EX OINT
1.0000 "application " | TOPICAL_OINTMENT | Freq: Two times a day (BID) | CUTANEOUS | Status: AC
  Administered 2012-06-23 – 2012-06-27 (×10): 1 via NASAL
  Filled 2012-06-23: qty 22

## 2012-06-23 MED ORDER — ASPIRIN EC 81 MG PO TBEC
81.0000 mg | DELAYED_RELEASE_TABLET | Freq: Once | ORAL | Status: AC
  Administered 2012-06-23: 81 mg via ORAL
  Filled 2012-06-23: qty 1

## 2012-06-23 MED ORDER — ACETAMINOPHEN 325 MG PO TABS
650.0000 mg | ORAL_TABLET | ORAL | Status: DC | PRN
  Administered 2012-06-26: 650 mg via ORAL
  Filled 2012-06-23: qty 2

## 2012-06-23 MED ORDER — OXYCODONE-ACETAMINOPHEN 5-325 MG PO TABS
1.0000 | ORAL_TABLET | Freq: Four times a day (QID) | ORAL | Status: DC | PRN
  Administered 2012-06-23 – 2012-06-28 (×13): 1 via ORAL
  Filled 2012-06-23 (×13): qty 1

## 2012-06-23 MED ORDER — OXYCODONE-ACETAMINOPHEN 10-325 MG PO TABS: 1.0000 | ORAL_TABLET | Freq: Four times a day (QID) | ORAL | Status: DC | PRN

## 2012-06-23 MED ORDER — CITALOPRAM HYDROBROMIDE 20 MG PO TABS
20.0000 mg | ORAL_TABLET | Freq: Every day | ORAL | Status: DC
  Administered 2012-06-23 – 2012-06-28 (×6): 20 mg via ORAL
  Filled 2012-06-23 (×6): qty 1

## 2012-06-23 MED ORDER — POTASSIUM CHLORIDE CRYS ER 10 MEQ PO TBCR
10.0000 meq | EXTENDED_RELEASE_TABLET | Freq: Every day | ORAL | Status: DC
  Administered 2012-06-23 – 2012-06-24 (×2): 10 meq via ORAL
  Filled 2012-06-23 (×3): qty 1

## 2012-06-23 MED ORDER — SODIUM CHLORIDE 0.9 % IJ SOLN: 3.0000 mL | INTRAMUSCULAR | Status: DC | PRN

## 2012-06-23 MED ORDER — ASPIRIN EC 81 MG PO TBEC
243.0000 mg | DELAYED_RELEASE_TABLET | Freq: Once | ORAL | Status: AC
  Administered 2012-06-23: 243 mg via ORAL
  Filled 2012-06-23: qty 3

## 2012-06-23 MED ORDER — FUROSEMIDE 10 MG/ML IJ SOLN
10.0000 mg | Freq: Once | INTRAMUSCULAR | Status: AC
  Administered 2012-06-23: 06:00:00 10 mg via INTRAVENOUS
  Filled 2012-06-23: qty 1

## 2012-06-23 MED ORDER — LEVOFLOXACIN 500 MG PO TABS
500.0000 mg | ORAL_TABLET | Freq: Every day | ORAL | Status: AC
  Administered 2012-06-23 – 2012-06-27 (×5): 500 mg via ORAL
  Filled 2012-06-23 (×5): qty 1

## 2012-06-23 MED ORDER — METOPROLOL TARTRATE 12.5 MG HALF TABLET
12.5000 mg | ORAL_TABLET | Freq: Two times a day (BID) | ORAL | Status: DC
  Administered 2012-06-23 – 2012-06-28 (×10): 12.5 mg via ORAL
  Filled 2012-06-23 (×13): qty 1

## 2012-06-23 MED ORDER — PANTOPRAZOLE SODIUM 40 MG PO TBEC
40.0000 mg | DELAYED_RELEASE_TABLET | Freq: Every day | ORAL | Status: DC
  Administered 2012-06-23 – 2012-06-28 (×6): 40 mg via ORAL
  Filled 2012-06-23 (×7): qty 1

## 2012-06-23 MED ORDER — OXYCODONE HCL 5 MG PO TABS
5.0000 mg | ORAL_TABLET | Freq: Four times a day (QID) | ORAL | Status: DC | PRN
  Administered 2012-06-23 – 2012-06-28 (×7): 5 mg via ORAL
  Filled 2012-06-23 (×7): qty 1

## 2012-06-23 MED ORDER — ALBUTEROL SULFATE (5 MG/ML) 0.5% IN NEBU
2.5000 mg | INHALATION_SOLUTION | RESPIRATORY_TRACT | Status: DC | PRN
  Administered 2012-06-23 – 2012-06-27 (×4): 2.5 mg via RESPIRATORY_TRACT
  Filled 2012-06-23 (×4): qty 0.5

## 2012-06-23 MED ORDER — TIOTROPIUM BROMIDE MONOHYDRATE 18 MCG IN CAPS
18.0000 ug | ORAL_CAPSULE | Freq: Every day | RESPIRATORY_TRACT | Status: DC
  Administered 2012-06-23 – 2012-06-27 (×5): 18 ug via RESPIRATORY_TRACT
  Filled 2012-06-23 (×2): qty 5

## 2012-06-23 MED ORDER — NITROGLYCERIN 0.4 MG SL SUBL
0.4000 mg | SUBLINGUAL_TABLET | SUBLINGUAL | Status: DC | PRN
  Administered 2012-06-27: 0.4 mg via SUBLINGUAL

## 2012-06-23 NOTE — ED Notes (Signed)
[facility] in ED to transport pt to [place].  

## 2012-06-23 NOTE — Progress Notes (Signed)
Positive MRSA Doctor notified pt placed on precautions.

## 2012-06-23 NOTE — Progress Notes (Signed)
ANTIBIOTIC CONSULT NOTE - INITIAL  Pharmacy Consult for levaquin Indication: bronchitis x 5 days  Allergies  Allergen Reactions  . Aspirin Other (See Comments)    Patient states no reaction, only told not to take while on other blood thinner  . Penicillins Hives and Itching    Patient Measurements: Height: 5\' 4"  (162.6 cm) Weight: 135 lb 9.3 oz (61.5 kg) IBW/kg (Calculated) : 54.7 Adjusted Body Weight:   Vital Signs: Temp: 98 F (36.7 C) (03/06 0745) Temp src: Oral (03/06 0745) BP: 111/61 mmHg (03/06 0745) Pulse Rate: 84 (03/06 0745) Intake/Output from previous day:   Intake/Output from this shift:    Labs:  Recent Labs  06/22/12 2005  WBC 7.9  HGB 11.4*  PLT 211  CREATININE 0.57   Estimated Creatinine Clearance: 58.9 ml/min (by C-G formula based on Cr of 0.57). No results found for this basename: VANCOTROUGH, Leodis Binet, VANCORANDOM, GENTTROUGH, GENTPEAK, GENTRANDOM, TOBRATROUGH, TOBRAPEAK, TOBRARND, AMIKACINPEAK, AMIKACINTROU, AMIKACIN,  in the last 72 hours   Microbiology: Recent Results (from the past 720 hour(s))  MRSA PCR SCREENING     Status: Abnormal   Collection Time    06/23/12  6:34 AM      Result Value Range Status   MRSA by PCR POSITIVE (*) NEGATIVE Final   Comment:            The GeneXpert MRSA Assay (FDA     approved for NASAL specimens     only), is one component of a     comprehensive MRSA colonization     surveillance program. It is not     intended to diagnose MRSA     infection nor to guide or     monitor treatment for     MRSA infections.     RESULT CALLED TO, READ BACK BY AND VERIFIED WITH:     Will Bonnet, RN 06/23/12 0830 BY KSCHULTZ    Medical History: Past Medical History  Diagnosis Date  . Myocardial infarction     times 2  . CHF (congestive heart failure)   . Stroke   . Deep vein thrombosis     Phlebitis  . Hyperlipidemia   . Sleep apnea   . COPD (chronic obstructive pulmonary disease)     02 dep chronically ; HFA 50%  p coaching January 24,2012  . Coronary artery disease   . Chronic pain     due to prior hip fracture and DDD back  . Shortness of breath     Medications:  Scheduled:  . [COMPLETED] aspirin EC  243 mg Oral Once  . [COMPLETED] aspirin EC  81 mg Oral Once  . [START ON 06/24/2012] aspirin EC  81 mg Oral Daily  . atorvastatin  40 mg Oral q1800  . Chlorhexidine Gluconate Cloth  6 each Topical Q0600  . citalopram  20 mg Oral Daily  . fenofibrate  160 mg Oral Daily  . [COMPLETED] furosemide  10 mg Intravenous Once  . furosemide  40 mg Oral BID  . isosorbide mononitrate  30 mg Oral Daily  . metoprolol tartrate  12.5 mg Oral BID  . mometasone-formoterol  2 puff Inhalation BID  . montelukast  10 mg Oral QHS  . [COMPLETED]  morphine injection  4 mg Intravenous Once  . [COMPLETED]  morphine injection  6 mg Intravenous Once  . mupirocin ointment  1 application Nasal BID  . pantoprazole  40 mg Oral Daily  . potassium chloride  10 mEq Oral Daily  .  sodium chloride  3 mL Intravenous Q12H  . tiotropium  18 mcg Inhalation Daily  . [DISCONTINUED] aspirin  81 mg Oral Daily  . [DISCONTINUED] aspirin  325 mg Oral Daily  . [DISCONTINUED] clopidogrel  75 mg Oral Once  . [DISCONTINUED] enoxaparin  1 mg/kg Subcutaneous Once   Infusions:  . heparin     Assessment: 40 female with bronchitis will be put on levaquin PO for 5 days as ordered by MD.  CrCl ~59  Goal of Therapy:    Plan:  1) Levaquin 500mg  PO every 24 hours x 5 days.  So, Tsz-Yin 06/23/2012,8:49 AM

## 2012-06-23 NOTE — Progress Notes (Signed)
ANTICOAGULATION CONSULT NOTE - Initial Consult  Pharmacy Consult for heparin Indication: CAD and h/o DVT  Allergies  Allergen Reactions  . Aspirin Other (See Comments)    Son does not remember reaction  . Penicillins Hives and Itching    Patient Measurements: Height: 5\' 4"  (162.6 cm) Weight: 135 lb 9.3 oz (61.5 kg) IBW/kg (Calculated) : 54.7  Vital Signs: Temp: 98.3 F (36.8 C) (03/06 0217) Temp src: Oral (03/06 0217) BP: 109/62 mmHg (03/06 0400) Pulse Rate: 95 (03/06 0217)  Labs:  Recent Labs  06/22/12 2005 06/22/12 2318  HGB 11.4*  --   HCT 34.6*  --   PLT 211  --   CREATININE 0.57  --   TROPONINI <0.30 <0.30    Estimated Creatinine Clearance: 58.9 ml/min (by C-G formula based on Cr of 0.57).   Medical History: Past Medical History  Diagnosis Date  . Myocardial infarction     times 2  . CHF (congestive heart failure)   . Stroke   . Deep vein thrombosis     Phlebitis  . Hyperlipidemia   . Sleep apnea   . 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 Coronary artery disease   . Chronic pain     due to prior hip fracture and DDD back    Medications:  Prescriptions prior to admission  Medication Sig Dispense Refill  . albuterol (ACCUNEB) 0.63 MG/3ML nebulizer solution Take 1 ampule by nebulization every 6 (six) hours as needed. For wheezing      . atorvastatin (LIPITOR) 40 MG tablet Take 40 mg by mouth daily.      . citalopram (CELEXA) 20 MG tablet Take 20 mg by mouth daily.       . clonazePAM (KLONOPIN) 0.5 MG tablet Take 1 mg by mouth 3 (three) times daily as needed. For nervousness      . fenofibrate 160 MG tablet Take 160 mg by mouth daily.       . Fluticasone-Salmeterol (ADVAIR) 250-50 MCG/DOSE AEPB Inhale 1 puff into the lungs every 12 (twelve) hours.      . folic acid (FOLVITE) 1 MG tablet Take 1 mg by mouth daily.      . furosemide (LASIX) 80 MG tablet Take 40 mg by mouth 2 (two)  times daily.      Marland Kitchen ipratropium-albuterol (DUONEB) 0.5-2.5 (3) MG/3ML SOLN Take 3 mLs by nebulization 3 (three) times daily. For shortness of breath or wheezing  360 mL  3  . isosorbide mononitrate (IMDUR) 30 MG 24 hr tablet Take 1 tablet (30 mg total) by mouth daily.  30 tablet  0  . levofloxacin (LEVAQUIN) 750 MG tablet Take 1 tablet (750 mg total) by mouth daily.  4 tablet  0  . metoprolol tartrate (LOPRESSOR) 25 MG tablet Take 25 mg by mouth 2 (two) times daily.      . montelukast (SINGULAIR) 10 MG tablet Take 10 mg by mouth at bedtime.      Marland Kitchen omeprazole (PRILOSEC) 20 MG capsule Take 20 mg by mouth daily.       . ondansetron (ZOFRAN) 4 MG tablet Take 1 tablet (4 mg total) by mouth every 6 (six) hours.  12 tablet  0  . oxyCODONE-acetaminophen (PERCOCET) 10-325 MG per tablet Take 1 tablet by mouth every 6 (six) hours as needed for pain. For pain  30 tablet  0  . potassium chloride (K-DUR,KLOR-CON) 10 MEQ tablet Take  1 tablet (10 mEq total) by mouth daily.  60 tablet  0  . promethazine (PHENERGAN) 25 MG tablet Take 0.5-1 tablets (12.5-25 mg total) by mouth every 6 (six) hours as needed for nausea. For nausea  30 tablet  0  . Rivaroxaban (XARELTO) 20 MG TABS Take 20 mg by mouth daily.      Marland Kitchen tiotropium (SPIRIVA) 18 MCG inhalation capsule Place 18 mcg into inhaler and inhale daily.       . nitroGLYCERIN (NITROSTAT) 0.4 MG SL tablet Place 0.4 mg under the tongue every 5 (five) minutes as needed. For chest pain       Scheduled:  . atorvastatin  40 mg Oral q1800  . citalopram  20 mg Oral Daily  . clopidogrel  75 mg Oral Once  . fenofibrate  160 mg Oral Daily  . furosemide  40 mg Oral BID  . isosorbide mononitrate  30 mg Oral Daily  . metoprolol tartrate  12.5 mg Oral BID  . mometasone-formoterol  2 puff Inhalation BID  . montelukast  10 mg Oral QHS  . [COMPLETED]  morphine injection  4 mg Intravenous Once  . [COMPLETED]  morphine injection  6 mg Intravenous Once  . pantoprazole  40 mg Oral  Daily  . potassium chloride  10 mEq Oral Daily  . sodium chloride  3 mL Intravenous Q12H  . tiotropium  18 mcg Inhalation Daily  . [DISCONTINUED] aspirin  81 mg Oral Daily  . [DISCONTINUED] aspirin  325 mg Oral Daily  . [DISCONTINUED] enoxaparin  1 mg/kg Subcutaneous Once    Assessment: 67yo female c/o abdominal pain and chest pressure with SOB, has prior h/o MI, initial CE negative, to transition from Xarelto to heparin for possible intervention.  Goal of Therapy:  Heparin level 0.3-0.7 units/ml Monitor platelets by anticoagulation protocol: Yes   Plan:  Will begin heparin gtt 24hr after last Xarelto dose (3/5 1800) at 900 units/hr and monitor heparin levels (aPTT until Xarelto cleared) and CBC.  Vernard Gambles, PharmD, BCPS  06/23/2012,5:04 AM

## 2012-06-23 NOTE — Progress Notes (Signed)
[facility] PROGRESS NOTE  Aimee Sanders:[id number] DOB: 1945-05-23 DOA: 06/22/2012 PCP: Bennie Pierini, FNP  Assessment/Plan: Unstable angina with EKG changes, known CAD Cardiac enzymes negative thus far. Although pain reported to be chronic and reproducable on palpation concerning for musculoskeletal pain. On Xarelto, last dose 3/5 at 6 PM. Therefore Lovenox was not given, plan to start the patient on heparin drip in the evening of 06/23/2012. Received a dose of aspirin on 06/23/2012 though she has an allergy to aspirin. Plavix discontinued by cardiology. Cardiology, Dr. Tresa Endo following the patient. Continue Imdur, statin; beta blocker if blood pressure can tolerate.  Cough/Bronchitis Start levofloxacin for a total of 5 days. Complicated by tobacco use. Counseled on cessation.  Left-sided abdominal pain? Exam benign. CT abdomen and pelvis unremarkable. Suspect referred pain. Currently not complaining of abdominal pain.  History of systolic CHF, appears compensated 11/2011 ejection fraction of 40-45%, inferolateral hypokinesis. Continue Lasix.  Oxygen-dependent COPD/Chronic respiratory failure Stable. Continue Advair, Spiriva, nebulizer treatments, Singulair.  History of DVT On Xarelto as outpatient, on hold pending further evaluation, will need DVT prophylaxis by evening 3/6 with heparin. History of intravenous streptokinase.  History of stroke Stable  OSA Cannot tolerate CPAP.  Ongoing cigarette use Smoking cessation counseled.  Code Status: Full code  Family Communication: Family not present.  Disposition Plan: Pending.  Consultants:  Cardiology  Procedures:  As above.  Antibiotics:  Levofloxacin 06/23/2012 >>  HPI/Subjective: Complaining of chest pain worse with palpation. Had a coughing episode this morning.  Objective: Filed Vitals:   06/23/12 0521 06/23/12 0745 06/23/12 0825 06/23/12 0830  BP: 105/42 111/61    Pulse: 83 84    Temp: 97.9 F  (36.6 C) 98 F (36.7 C)    TempSrc: Oral Oral    Resp:      Height:      Weight:      SpO2: 99% 98% 97% 95%   No intake or output data in the 24 hours ending 06/23/12 0837 Filed Weights   06/23/12 0217  Weight: 61.5 kg (135 lb 9.3 oz)    Exam: Physical Exam: General: Awake, Oriented, No acute distress. HEENT: EOMI. Neck: Supple CV: S1 and S2 Lungs: Coarse breath sounds with scattered wheezing with good air movement. Abdomen: Soft, Nontender, Nondistended, +bowel sounds. Ext: Good pulses. Trace edema.  Data Reviewed: Basic Metabolic Panel:  Recent Labs Lab 06/22/12 2005  NA 143  K 3.5  CL 102  CO2 34*  GLUCOSE 92  BUN 7  CREATININE 0.57  CALCIUM 8.3*   Liver Function Tests:  Recent Labs Lab 06/22/12 2005  AST 28  ALT 12  ALKPHOS 86  BILITOT 0.2*  PROT 5.4*  ALBUMIN 2.2*    Recent Labs Lab 06/22/12 2005  LIPASE 25   No results found for this basename: AMMONIA,  in the last 168 hours CBC:  Recent Labs Lab 06/22/12 2005  WBC 7.9  NEUTROABS 3.4  HGB 11.4*  HCT 34.6*  MCV 91.8  PLT 211   Cardiac Enzymes:  Recent Labs Lab 06/22/12 2005 06/22/12 2318 06/23/12 0630  TROPONINI <0.30 <0.30 <0.30   BNP (last 3 results)  Recent Labs  01/01/12 1612 02/08/12 1121 06/22/12 2005  PROBNP 2063.0* 1958.0* 3562.0*   CBG: No results found for this basename: GLUCAP,  in the last 168 hours  Recent Results (from the past 240 hour(s))  MRSA PCR SCREENING     Status: Abnormal   Collection Time    06/23/12  6:34 AM  Result Value Range Status   MRSA by PCR POSITIVE (*) NEGATIVE Final   Comment:            The GeneXpert MRSA Assay (FDA     approved for NASAL specimens     only), is one component of a     comprehensive MRSA colonization     surveillance program. It is not     intended to diagnose MRSA     infection nor to guide or     monitor treatment for     MRSA infections.     RESULT CALLED TO, READ BACK BY AND VERIFIED WITH:     Will Bonnet, RN 06/23/12 0830 BY KSCHULTZ     Studies: Dg Chest 2 View  06/22/2012  *RADIOLOGY REPORT*  Clinical Data: Shortness of breath, chest pain  CHEST - 2 VIEW  Comparison: 06/04/2012  Findings: Changes of vertebral augmentation in the mid and lower thoracic spine.  Diffuse osteopenia.  Stable compression deformity near the thoracolumbar junction.  Lungs are hyperinflated, clear. Mild cardiomegaly.  No effusion. Vascular clips in the upper abdomen.  IMPRESSION:  1.  Stable cardiomegaly, hyperinflation, and postoperative changes.   Original Report Authenticated By: D. Andria Rhein, MD    Ct Abdomen Pelvis W Contrast  06/22/2012  *RADIOLOGY REPORT*  Clinical Data: Short of breath, muscle pain  CT ABDOMEN AND PELVIS WITH CONTRAST  Technique:  Multidetector CT imaging of the abdomen and pelvis was performed following the standard protocol during bolus administration of intravenous contrast.  Contrast: OMNIPAQUE IOHEXOL 300 MG/ML  SOLN  Comparison: CT 06/13/2010  Findings: Lung bases are clear.  Small pericardial effusion.  No focal hepatic lesion.  There is mild intrahepatic and extrahepatic biliary dilatation likely related to prior cholecystectomy.  The pancreas is atrophic without evidence of acute inflammation.  The spleen, adrenal glands, and kidneys are unremarkable.  Stomach, small bowel, and cecum are normal.  There is a moderate volume stool throughout the colon.  No retroperitoneal or periportal lymphadenopathy.  Abdominal aorta is normal caliber.  There is no free fluid the pelvis.  Post hysterectomy anatomy. There are diverticula sigmoid colon of acute inflammation.  There are compression fractures at T11, T12 and L1.  The thoracic levels have augmentation.  Findings are unchanged from prior.  IMPRESSION:  1.  No acute findings in the abdomen or pelvis. 2.  Sigmoid diverticulosis of diverticulitis. 3.  Mild intra and extrahepatic biliary duct dilatation likely related to cholecystectomy. 4.   Essentially stable compression fracture of a lower thoracic and upper lumbar spine.   Original Report Authenticated By: Genevive Bi, M.D.     Scheduled Meds: . [START ON 06/24/2012] aspirin EC  81 mg Oral Daily  . atorvastatin  40 mg Oral q1800  . citalopram  20 mg Oral Daily  . fenofibrate  160 mg Oral Daily  . furosemide  40 mg Oral BID  . isosorbide mononitrate  30 mg Oral Daily  . metoprolol tartrate  12.5 mg Oral BID  . mometasone-formoterol  2 puff Inhalation BID  . montelukast  10 mg Oral QHS  . pantoprazole  40 mg Oral Daily  . potassium chloride  10 mEq Oral Daily  . sodium chloride  3 mL Intravenous Q12H  . tiotropium  18 mcg Inhalation Daily   Continuous Infusions: . heparin      Principal Problem:   Unstable angina Active Problems:   SMOKER   DEEP VEIN THROMBOSIS/PHLEBITIS   RESPIRATORY FAILURE,  CHRONIC   SLEEP APNEA   Chronic systolic heart failure   COPD (chronic obstructive pulmonary disease)    Marina Boerner A, MD  [facility] Pager [phone]. If 7PM-7AM, please contact night-coverage at [email or web address], password [facility] 06/23/2012, 8:37 AM  LOS: 1 day

## 2012-06-23 NOTE — Progress Notes (Signed)
Patient Name: Aimee Sanders Date of Encounter: 06/23/2012   Principal Problem:   Unstable angina Active Problems:   HYPERLIPIDEMIA   SMOKER   DEEP VEIN THROMBOSIS/PHLEBITIS   RESPIRATORY FAILURE, CHRONIC   SLEEP APNEA   COPD (chronic obstructive pulmonary disease)   Chronic combined systolic and diastolic CHF (congestive heart failure)   Coronary artery disease   SUBJECTIVE  No further chest pain or dyspnea this AM.  She has been having intermittent rest and exertional chest pressure/burning associated with dyspnea on a daily basis over the last 3 wks.  This typically lasts 15 mins and is relieved with rest and nebulizer treatment.  She also has an upper chest soreness/tenderness that is more sharp in nature and is different from the pressure/burning (which is not reproducible with palpation).  Yesterday's episode of chest pain occurred in the setting of significant emotional upset (sister hospitalized for cancer care) and lasted several hours prior to improving with morphine and nebulizer.  Despite prolonged Ss yesterday, CE have been negative.  ECG continues to show anterolateral st dep with t changes - new since 02/2012.  CURRENT MEDS . [START ON 06/24/2012] aspirin EC  81 mg Oral Daily  . atorvastatin  40 mg Oral q1800  . Chlorhexidine Gluconate Cloth  6 each Topical Q0600  . citalopram  20 mg Oral Daily  . fenofibrate  160 mg Oral Daily  . furosemide  40 mg Oral BID  . isosorbide mononitrate  30 mg Oral Daily  . levofloxacin  500 mg Oral Q1200  . metoprolol tartrate  12.5 mg Oral BID  . mometasone-formoterol  2 puff Inhalation BID  . montelukast  10 mg Oral QHS  . mupirocin ointment  1 application Nasal BID  . pantoprazole  40 mg Oral Daily  . potassium chloride  10 mEq Oral Daily  . sodium chloride  3 mL Intravenous Q12H  . tiotropium  18 mcg Inhalation Daily   OBJECTIVE  Filed Vitals:   06/23/12 0521 06/23/12 0745 06/23/12 0825 06/23/12 0830  BP: 105/42 111/61      Pulse: 83 84    Temp: 97.9 F (36.6 C) 98 F (36.7 C)    TempSrc: Oral Oral    Resp:      Height:      Weight:      SpO2: 99% 98% 97% 95%   No intake or output data in the 24 hours ending 06/23/12 0944 Filed Weights   06/23/12 0217  Weight: 135 lb 9.3 oz (61.5 kg)   PHYSICAL EXAM  General: Pleasant, NAD. Neuro: Alert and oriented X 3. Moves all extremities spontaneously. Psych: Normal affect. HEENT:  Normal  Neck: Supple without bruits or JVD. Lungs:  Resp regular and unlabored, very diminished breath sounds bilaterally.  Heart: RRR - distant. Abdomen: Soft, non-tender, non-distended, BS + x 4.  Extremities: No clubbing, cyanosis.  2+ edema RLE, 1+ edema LLE. DP/PT/Radials trace bilaterally.  Accessory Clinical Findings  CBC  Recent Labs  06/22/12 2005  WBC 7.9  NEUTROABS 3.4  HGB 11.4*  HCT 34.6*  MCV 91.8  PLT 211   Basic Metabolic Panel  Recent Labs  06/22/12 2005  NA 143  K 3.5  CL 102  CO2 34*  GLUCOSE 92  BUN 7  CREATININE 0.57  CALCIUM 8.3*   Liver Function Tests  Recent Labs  06/22/12 2005  AST 28  ALT 12  ALKPHOS 86  BILITOT 0.2*  PROT 5.4*  ALBUMIN 2.2*  Recent Labs  06/22/12 2005  LIPASE 25   Cardiac Enzymes  Recent Labs  06/22/12 2005 06/22/12 2318 06/23/12 0630  TROPONINI <0.30 <0.30 <0.30   Fasting Lipid Panel  Recent Labs  06/23/12 0630  CHOL 113  HDL 70  LDLCALC 23  TRIG 101  CHOLHDL 1.6   TELE  rsr  ECG  Rsr, 85, lad, antlat st dep/twi.  Radiology/Studies  Dg Chest 2 View  06/22/2012  *RADIOLOGY REPORT*  Clinical Data: Shortness of breath, chest pain  CHEST - 2 VIEW  Comparison: 06/04/2012  Findings: Changes of vertebral augmentation in the mid and lower thoracic spine.  Diffuse osteopenia.  Stable compression deformity near the thoracolumbar junction.  Lungs are hyperinflated, clear. Mild cardiomegaly.  No effusion. Vascular clips in the upper abdomen.  IMPRESSION:  1.  Stable cardiomegaly,  hyperinflation, and postoperative changes.   Original Report Authenticated By: D. Andria Rhein, MD    Ct Abdomen Pelvis W Contrast  06/22/2012  *RADIOLOGY REPORT*  Clinical Data: Short of breath, muscle pain  CT ABDOMEN AND PELVIS WITH CONTRAST  Technique:  IMPRESSION:  1.  No acute findings in the abdomen or pelvis. 2.  Sigmoid diverticulosis of diverticulitis. 3.  Mild intra and extrahepatic biliary duct dilatation likely related to cholecystectomy. 4.  Essentially stable compression fracture of a lower thoracic and upper lumbar spine.   Original Report Authenticated By: Genevive Bi, M.D.     ASSESSMENT AND PLAN  1.  [place]:  Pt presents with 3 wk h/o intermittent rest and exertional chest pain assoc w/ sob.  Yesterday's episode occurred @ rest in the setting of emotional upset and lasted several hrs prior to relief w/ mso4 and nebulizer treatment.  She is pain free this AM.  Despite prolonged episode of pain yesterday, her cardiac markers are negative.  That said, her ECG is changed from 02/2012 with anterolateral ST/T changes.  She apparently had a similar presentation to [facility] in 06/2010 with st/t changes in antlat leads and subsequently underwent myoview, which showed infarct w/o ischemia.  She was medically managed @ that time.  ECG is concerning for active ischemia.  She has apparently has had 2 prior MI's with attempt @ cath [facility]) thwarted by poor access/peripheral vascular circulation per pt report.  This being the case along with her overall poor general health, we will move forward with a lexiscan myoview in the AM to risk stratify.  2.  Chronic combined syst/diast chf:  Volume looks ok.  HR/BP controlled.  Cont bb.  Limited BP room to add acei/arb.  3.  Chronic Resp Failure/COPD/Tob Abuse:  Little air mvmt on exam.  CXR w/o acute findings.  Smoking cessation counseling provided.  Abx per IM.  4.  R LE DVT:  On chronic xarelto after years of labile INR.    Signed, Nicolasa Ducking NP  Patient seen, examined. Available data reviewed. Agree with findings, assessment, and plan as outlined by Ward Givens, NP. Exam reveals an elderly-appearing woman in no acute distress. Lungs show very poor air movement bilaterally. Heart sounds are distant and regular. There is no peripheral edema. EKG demonstrates T-wave changes and QT prolongation concerning for lateral ischemia. I think the patient has a high risk of obstructive CAD, but she really is a very poor candidate for invasive cardiac procedures. She has very severe COPD, home O2 requiring at 3 L per minute, and poor functional capacity. She does live independently but is only able to ambulate very short distances. I  think the most prudent approach is to proceed with a nuclear scan for risk stratification and only pursue cardiac catheterization if her noninvasive study is very high-risk. Otherwise I would recommend medical management. Discussed this approach at length with the patient and she agrees.  Tonny Bollman, M.D. 06/23/2012 10:53 AM

## 2012-06-24 ENCOUNTER — Inpatient Hospital Stay (HOSPITAL_COMMUNITY): Payer: PRIVATE HEALTH INSURANCE

## 2012-06-24 DIAGNOSIS — R079 Chest pain, unspecified: Secondary | ICD-10-CM

## 2012-06-24 DIAGNOSIS — R072 Precordial pain: Secondary | ICD-10-CM

## 2012-06-24 LAB — CBC
HCT: 32.7 % — ABNORMAL LOW (ref 36.0–46.0)
Hemoglobin: 10.8 g/dL — ABNORMAL LOW (ref 12.0–15.0)
MCH: 30.4 pg (ref 26.0–34.0)
MCV: 92.1 fL (ref 78.0–100.0)
RBC: 3.55 MIL/uL — ABNORMAL LOW (ref 3.87–5.11)
WBC: 7.7 10*3/uL (ref 4.0–10.5)

## 2012-06-24 LAB — BASIC METABOLIC PANEL
BUN: 4 mg/dL — ABNORMAL LOW (ref 6–23)
CO2: 38 mEq/L — ABNORMAL HIGH (ref 19–32)
Calcium: 8.8 mg/dL (ref 8.4–10.5)
Chloride: 96 mEq/L (ref 96–112)
Creatinine, Ser: 0.58 mg/dL (ref 0.50–1.10)
Glucose, Bld: 80 mg/dL (ref 70–99)

## 2012-06-24 LAB — APTT
aPTT: 69 seconds — ABNORMAL HIGH (ref 24–37)
aPTT: 77 seconds — ABNORMAL HIGH (ref 24–37)

## 2012-06-24 MED ORDER — TECHNETIUM TC 99M SESTAMIBI GENERIC - CARDIOLITE
30.0000 | Freq: Once | INTRAVENOUS | Status: AC | PRN
  Administered 2012-06-24: 30 via INTRAVENOUS

## 2012-06-24 MED ORDER — REGADENOSON 0.4 MG/5ML IV SOLN
INTRAVENOUS | Status: AC
  Administered 2012-06-24: 12:00:00 0.4 mg via INTRAVENOUS
  Filled 2012-06-24: qty 5

## 2012-06-24 MED ORDER — TECHNETIUM TC 99M SESTAMIBI GENERIC - CARDIOLITE
10.0000 | Freq: Once | INTRAVENOUS | Status: AC | PRN
  Administered 2012-06-24: 10 via INTRAVENOUS

## 2012-06-24 MED ORDER — ONDANSETRON HCL 4 MG/2ML IJ SOLN
INTRAMUSCULAR | Status: AC
  Filled 2012-06-24: qty 2

## 2012-06-24 MED ORDER — HEPARIN (PORCINE) IN NACL 100-0.45 UNIT/ML-% IJ SOLN
900.0000 [IU]/h | INTRAMUSCULAR | Status: DC
  Administered 2012-06-24 – 2012-06-26 (×2): 900 [IU]/h via INTRAVENOUS
  Filled 2012-06-24 (×3): qty 250

## 2012-06-24 MED ORDER — POTASSIUM CHLORIDE 10 MEQ/100ML IV SOLN
10.0000 meq | INTRAVENOUS | Status: AC
  Administered 2012-06-24 (×3): 10 meq via INTRAVENOUS
  Filled 2012-06-24 (×4): qty 100

## 2012-06-24 NOTE — Progress Notes (Signed)
Pt. Had episode of short of breath this am, sitting at the side of the bed O2 sats. 87-88% at 3l. BS bilaterally diminished with tight exp. wheeze breathing tx. was given and percocet given for pain on her lt. side due to constant coughing and also ant. chest. O2 sats. Increase to 4l with sat.s up to >94%. Pt. felt better after breathing tx. with fair air movement ant. chest  and still diminished bil. at bases. Pt. with on and off congested coug, orally suctioned thick clear secretions. Leatha Gilding made aware of pts. symptoms, will cont. to monitor. Resting comfortably at present.

## 2012-06-24 NOTE — Progress Notes (Signed)
ANTICOAGULATION CONSULT NOTE - Follow Up Consult  Pharmacy Consult for Heparin Indication: chest pain/ACS with hx DVT (on Xarelto PTA)  Allergies  Allergen Reactions  . Penicillins Hives and Itching    Patient Measurements: Height: 5\' 4"  (162.6 cm) Weight: 133 lb 9.6 oz (60.6 kg) IBW/kg (Calculated) : 54.7 Heparin Dosing Weight: 60 kg  Vital Signs: Temp: 99.3 F (37.4 C) (03/07 1621) Temp src: Oral (03/07 1621) BP: 106/79 mmHg (03/07 1621) Pulse Rate: 83 (03/07 1621)  Labs:  Recent Labs  06/22/12 2005  06/23/12 0630 06/23/12 1100 06/23/12 1757 06/24/12 0012 06/24/12 0730 06/24/12 1524  HGB 11.4*  --   --   --   --   --  10.8*  --   HCT 34.6*  --   --   --   --   --  32.7*  --   PLT 211  --   --   --   --   --  189  --   APTT  --   --   --   --   --  69* 85* 77*  HEPARINUNFRC  --   --   --   --   --  0.83* 1.03* 0.52  CREATININE 0.57  --   --   --   --   --  0.58  --   TROPONINI <0.30  < > <0.30 <0.30 <0.30  --   --   --   < > = values in this interval not displayed.  Estimated Creatinine Clearance: 58.9 ml/min (by C-G formula based on Cr of 0.58).   Medications:  Scheduled:  . aspirin EC  81 mg Oral Daily  . atorvastatin  40 mg Oral q1800  . Chlorhexidine Gluconate Cloth  6 each Topical Q0600  . citalopram  20 mg Oral Daily  . fenofibrate  160 mg Oral Daily  . furosemide  40 mg Oral BID  . isosorbide mononitrate  30 mg Oral Daily  . levofloxacin  500 mg Oral Q1200  . LORazepam  0.5 mg Oral QID  . metoprolol tartrate  12.5 mg Oral BID  . mometasone-formoterol  2 puff Inhalation BID  . montelukast  10 mg Oral QHS  . mupirocin ointment  1 application Nasal BID  . ondansetron      . pantoprazole  40 mg Oral Daily  . potassium chloride  10 mEq Oral Daily  . [COMPLETED] potassium chloride  10 mEq Intravenous Q1 Hr x 3  . [COMPLETED] regadenoson  0.4 mg Intravenous Once  . sodium chloride  3 mL Intravenous Q12H  . tiotropium  18 mcg Inhalation Daily    Infusions:  . heparin    . [DISCONTINUED] heparin 900 Units/hr (06/24/12 0800)    Assessment: 67 yo F transitioned from Xarelto to heparin for CP, now s/p nuc med study and awaiting results.  Anti-Xa (heparin) level initially elevated as a result of Xarelto dosing.  Now heparin level and aPTT both within therapeutic goals.  Anticipate Xarelto effect is minimal at this point.  Goal of Therapy:  Heparin level 0.3-0.7 units/ml aPTT 66-102 seconds Monitor platelets by anticoagulation protocol: Yes   Plan:  Continue heparin at 900 units/hr. Follow-up results from nuc med study and resumption of Xarelto if indicated. Will d/c aPTTs and continue monitoring with heparin levels alone at this point.  [employer], Pharm.D., BCPS Clinical Pharmacist Pager [phone] 06/24/2012 5:39 PM

## 2012-06-24 NOTE — Progress Notes (Signed)
Patient Name: Aimee Sanders Date of Encounter: 06/24/2012     Principal Problem:   Unstable angina Active Problems:   HYPERLIPIDEMIA   SMOKER   DEEP VEIN THROMBOSIS/PHLEBITIS   RESPIRATORY FAILURE, CHRONIC   SLEEP APNEA   COPD (chronic obstructive pulmonary disease)   Chronic combined systolic and diastolic CHF (congestive heart failure)   Coronary artery disease    SUBJECTIVE: She reports experiencing an episode of sharp anterior chest pain this morning with associated shortness of breath. Aggravated by cough and deep inspiration. She reports a cough productive of yellow sputum.   OBJECTIVE  Filed Vitals:   06/24/12 0529 06/24/12 0540 06/24/12 0700 06/24/12 0814  BP:  109/54  108/49  Pulse: 75 71 76 87  Temp:  97.5 F (36.4 C)  97.8 F (36.6 C)  TempSrc:  Oral  Oral  Resp:  21  20  Height:      Weight:      SpO2: 91% 100% 94% 98%    Intake/Output Summary (Last 24 hours) at 06/24/12 0941 Last data filed at 06/24/12 0800  Gross per 24 hour  Intake    578 ml  Output   3450 ml  Net  -2872 ml   Weight change: -0.9 kg (-1 lb 15.8 oz)  PHYSICAL EXAM  General: Appears older than stated age, well nourished, in no acute distress. Head: Normocephalic, atraumatic, sclera non-icteric, no xanthomas, nares are without discharge.  Neck: Supple without bruits or JVD. Lungs:  Diffuse rhonchi in posterior lung fields, resp regular and unlabored, no wheezes or rales appreciated.  Heart: RRR no s3, s4, or murmurs. Abdomen: Soft, non-tender, non-distended, BS + x 4.  Msk:  Chest tenderness to palpation anteriorly, strength and tone appears normal for age. Extremities: 1+ pitting edema to bilateral LEs. No clubbing or cyanosis. DP/PT/Radials 2+ and equal bilaterally. Neuro: Alert and oriented X 3. Moves all extremities spontaneously. Psych: Normal affect.  LABS:  Recent Labs     06/22/12  2005  06/24/12  0730  WBC  7.9  7.7  HGB  11.4*  10.8*  HCT  34.6*  32.7*  MCV   91.8  92.1  PLT  211  189   Recent Labs Lab 06/22/12 2005 06/24/12 0730  NA 143 142  K 3.5 3.2*  CL 102 96  CO2 34* 38*  BUN 7 4*  CREATININE 0.57 0.58  CALCIUM 8.3* 8.8  PROT 5.4*  --   BILITOT 0.2*  --   ALKPHOS 86  --   ALT 12  --   AST 28  --   LIPASE 25  --   GLUCOSE 92 80   Recent Labs     06/23/12  0630  06/23/12  1100  06/23/12  1757  TROPONINI  <0.30  <0.30  <0.30   Recent Labs     06/23/12  0630  CHOL  113  HDL  70  LDLCALC  23  TRIG  101  CHOLHDL  1.6    TELE: NSR, 70-90 bpm  ECG: NSR, 78 bpm, downsloping ST depressions with associated TWIs in V3-V6, TWIs I, aVL new from 01/2012   Radiology/Studies:  Dg Chest 2 View  06/22/2012  *RADIOLOGY REPORT*  Clinical Data: Shortness of breath, chest pain  CHEST - 2 VIEW  Comparison: 06/04/2012  Findings: Changes of vertebral augmentation in the mid and lower thoracic spine.  Diffuse osteopenia.  Stable compression deformity near the thoracolumbar junction.  Lungs are hyperinflated, clear. Mild cardiomegaly.  No effusion. Vascular clips in the upper abdomen.  IMPRESSION:  1.  Stable cardiomegaly, hyperinflation, and postoperative changes.   Original Report Authenticated By: D. Andria Rhein, MD    Dg Ribs Unilateral W/chest Left  06/04/2012  *RADIOLOGY REPORT*  Clinical Data: Abdominal pain.  Emesis.  Left chest pain.  LEFT RIBS AND CHEST - 3+ VIEW  Comparison: 04/01/2012  Findings: Hyperaeration.  Mild cardiomegaly.  Normal vascularity. Stable left basilar atelectasis or scar.  Old left rib fractures with callus formation are noted.  No definite acute rib fractures. The patient is status post vertebroplasty at multiple thoracic levels. There is a cortical step off involving a mid right rib laterally.  Age of this fracture is indeterminate.  IMPRESSION: Old left-sided rib fractures.  No definite acute left rib fracture.  Right rib fracture of indeterminate age.  Hyperaeration is likely related to COPD.  Left basilar  scar.   Original Report Authenticated By: Jolaine Click, M.D.    Ct Abdomen Pelvis W Contrast  06/22/2012  *RADIOLOGY REPORT*  Clinical Data: Short of breath, muscle pain  CT ABDOMEN AND PELVIS WITH CONTRAST  Technique:  Multidetector CT imaging of the abdomen and pelvis was performed following the standard protocol during bolus administration of intravenous contrast.  Contrast: OMNIPAQUE IOHEXOL 300 MG/ML  SOLN  Comparison: CT 06/13/2010  Findings: Lung bases are clear.  Small pericardial effusion.  No focal hepatic lesion.  There is mild intrahepatic and extrahepatic biliary dilatation likely related to prior cholecystectomy.  The pancreas is atrophic without evidence of acute inflammation.  The spleen, adrenal glands, and kidneys are unremarkable.  Stomach, small bowel, and cecum are normal.  There is a moderate volume stool throughout the colon.  No retroperitoneal or periportal lymphadenopathy.  Abdominal aorta is normal caliber.  There is no free fluid the pelvis.  Post hysterectomy anatomy. There are diverticula sigmoid colon of acute inflammation.  There are compression fractures at T11, T12 and L1.  The thoracic levels have augmentation.  Findings are unchanged from prior.  IMPRESSION:  1.  No acute findings in the abdomen or pelvis. 2.  Sigmoid diverticulosis of diverticulitis. 3.  Mild intra and extrahepatic biliary duct dilatation likely related to cholecystectomy. 4.  Essentially stable compression fracture of a lower thoracic and upper lumbar spine.   Original Report Authenticated By: Genevive Bi, M.D.     Current Medications:  . aspirin EC  81 mg Oral Daily  . atorvastatin  40 mg Oral q1800  . Chlorhexidine Gluconate Cloth  6 each Topical Q0600  . citalopram  20 mg Oral Daily  . fenofibrate  160 mg Oral Daily  . furosemide  40 mg Oral BID  . isosorbide mononitrate  30 mg Oral Daily  . levofloxacin  500 mg Oral Q1200  . LORazepam  0.5 mg Oral QID  . metoprolol tartrate  12.5 mg  Oral BID  . mometasone-formoterol  2 puff Inhalation BID  . montelukast  10 mg Oral QHS  . mupirocin ointment  1 application Nasal BID  . pantoprazole  40 mg Oral Daily  . potassium chloride  10 mEq Oral Daily  . potassium chloride  10 mEq Intravenous Q1 Hr x 3  . regadenoson  0.4 mg Intravenous Once  . sodium chloride  3 mL Intravenous Q12H  . tiotropium  18 mcg Inhalation Daily    ASSESSMENT AND PLAN:  67 yo female with known CAD, chronic combined CHF, COPD (on chronic O2), RLE DVT (on Xarelto), ongoing  tobacco abuse, h/o CVA and OSA c/o intermittent chest pressure x 3 weeks with new EKG changes.   1. CAD/Unstable angina with new EKG changes 2. Chronic combined CHF 3. Chronic respiratory failure 2/2 O2-dependent COPD, OSA and # 2 4. Cough/bronchitis 5. H/o RLE DVT 6. Ongoing tobacco abuse 7. H/o CVA 8. Hypokalemia 9. Normocytic anemia  Description of chest discomfort contains both typical and atypical features. There is clear discomfort on palpation, cough and deep inspiration to her anterior and left-sided chest, described as sharp pain.  She also notes a dull pressure x 3 weeks. She has noted increased stress recently with her relative undergoing cancer treatment. She has also developed a recent bronchitis. Given her history of CAD, cardiac RFs and EKG changes, will plan to proceed with Lexiscan Myoview today (not actively wheezing). She has had difficult access on prior cath at [facility]. DDx also includes an etiology along the spectrum of stress-induced or viral cardiomyopathy, pleuritic and musculoskeletal inflammatory causes. Continue ASA (no true allergy- only told not to take it concomitantly with Coumadin- has been removed), BB, statin, Imdur, NTG SL PRN. Consider adding low-dose ACEi. Diuresing well on home dose Lasix, but still with bilateral LE edema. Continue current regimen. Currently on ACS-dose heparin. Will need to switch back to Xarelto if stress test not high risk.  Replete K. She will need increased outpatient K supplementation with Lasix dosing (40 mEq daily). Stressed tobacco cessation. Continue COPD therapies. On levaquin per primary team. She will need to be followed by both pulmonology and cardiology.   Signed, R. Hurman Horn, PA-C 06/24/2012, 9:41 AM   Attending Note:   The patient was seen and examined.  Agree with assessment and plan as noted above.  Changes made to the above note as needed.  I examined her in nuclear medicine after the [employer]  She was not in any acute distress.  I agree that she is a poor candidate for invasive procedures.   Will see what myoview shows.     Vesta Mixer, Montez Hageman., MD, [facility] 06/24/2012, 12:54 PM

## 2012-06-24 NOTE — Progress Notes (Addendum)
[facility] PROGRESS NOTE  Aimee Sanders:[id number] DOB: [birth year] DOA: 06/22/2012 PCP: Bennie Pierini, FNP  Assessment/Plan: Unstable angina with EKG changes, known CAD Cardiac enzymes negative thus far. Although pain reported to be chronic and reproducable on palpation concerning for musculoskeletal pain. On Xarelto, last dose 3/5 at 6 PM. Therefore Lovenox was not given, continue heparin drip in the evening of 06/23/2012. Continue aspirin, tolerated dose on 06/23/2012 though she has an allergy to aspirin. Plavix discontinued by cardiology. Cardiology, following the patient, planning on stress test this morning. Continue Imdur, statin; beta blocker if blood pressure can tolerate.  Cough/Bronchitis Continue levofloxacin for a total of 5 days. Complicated by tobacco use. Counseled on cessation.  Left-sided abdominal pain? Exam benign. CT abdomen and pelvis unremarkable. Suspect referred pain. Currently not complaining of abdominal pain.  History of systolic CHF, appears compensated 11/2011 ejection fraction of 40-45%, inferolateral hypokinesis. Continue Lasix.  Oxygen-dependent COPD/Chronic respiratory failure Stable. Continue Advair, Spiriva, nebulizer treatments, Singulair.  History of DVT On Xarelto as outpatient, on hold pending further evaluation. Continue heparin drip. History of intravenous streptokinase.  History of stroke Stable  OSA Cannot tolerate CPAP.  Ongoing cigarette use Smoking cessation counseled.  Hypokalemia Replace as needed.  Code Status: Full code  Family Communication: Family not present.  Disposition Plan: Pending.  Consultants:  Cardiology  Procedures:  As above.  Antibiotics:  Levofloxacin 06/23/2012 >>  HPI/Subjective: Complaining of pain under her left breast. Breathing better this AM.  Objective: Filed Vitals:   06/24/12 0529 06/24/12 0540 06/24/12 0700 06/24/12 0814  BP:  109/54  108/49  Pulse: 75 71 76 87  Temp:   97.5 F (36.4 C)  97.8 F (36.6 C)  TempSrc:  Oral  Oral  Resp:  21  20  Height:      Weight:      SpO2: 91% 100% 94% 98%    Intake/Output Summary (Last 24 hours) at 06/24/12 0914 Last data filed at 06/24/12 0800  Gross per 24 hour  Intake    578 ml  Output   3450 ml  Net  -2872 ml   Filed Weights   06/23/12 0217 06/24/12 0100  Weight: 61.5 kg (135 lb 9.3 oz) 60.6 kg (133 lb 9.6 oz)    Exam: Physical Exam: General: Awake, Oriented, No acute distress. HEENT: EOMI. Neck: Supple CV: S1 and S2 Lungs: Coarse breath sounds with scattered wheezing with good air movement. Abdomen: Soft, Nontender, Nondistended, +bowel sounds. Ext: Good pulses. Trace edema.  Data Reviewed: Basic Metabolic Panel:  Recent Labs Lab 06/22/12 2005 06/24/12 0730  NA 143 142  K 3.5 3.2*  CL 102 96  CO2 34* 38*  GLUCOSE 92 80  BUN 7 4*  CREATININE 0.57 0.58  CALCIUM 8.3* 8.8   Liver Function Tests:  Recent Labs Lab 06/22/12 2005  AST 28  ALT 12  ALKPHOS 86  BILITOT 0.2*  PROT 5.4*  ALBUMIN 2.2*    Recent Labs Lab 06/22/12 2005  LIPASE 25   No results found for this basename: AMMONIA,  in the last 168 hours CBC:  Recent Labs Lab 06/22/12 2005 06/24/12 0730  WBC 7.9 7.7  NEUTROABS 3.4  --   HGB 11.4* 10.8*  HCT 34.6* 32.7*  MCV 91.8 92.1  PLT 211 189   Cardiac Enzymes:  Recent Labs Lab 06/22/12 2005 06/22/12 2318 06/23/12 0630 06/23/12 1100 06/23/12 1757  TROPONINI <0.30 <0.30 <0.30 <0.30 <0.30   BNP (last 3 results)  Recent Labs  01/01/12  1612 02/08/12 1121 06/22/12 2005  PROBNP 2063.0* 1958.0* 3562.0*   CBG: No results found for this basename: GLUCAP,  in the last 168 hours  Recent Results (from the past 240 hour(s))  MRSA PCR SCREENING     Status: Abnormal   Collection Time    06/23/12  6:34 AM      Result Value Range Status   MRSA by PCR POSITIVE (*) NEGATIVE Final   Comment:            The GeneXpert MRSA Assay (FDA     approved for NASAL  specimens     only), is one component of a     comprehensive MRSA colonization     surveillance program. It is not     intended to diagnose MRSA     infection nor to guide or     monitor treatment for     MRSA infections.     RESULT CALLED TO, READ BACK BY AND VERIFIED WITH:     Will Bonnet, RN 06/23/12 0830 BY [email or web address]     Studies: Dg Chest 2 View  06/22/2012  *RADIOLOGY REPORT*  Clinical Data: Shortness of breath, chest pain  CHEST - 2 VIEW  Comparison: 06/04/2012  Findings: Changes of vertebral augmentation in the mid and lower thoracic spine.  Diffuse osteopenia.  Stable compression deformity near the thoracolumbar junction.  Lungs are hyperinflated, clear. Mild cardiomegaly.  No effusion. Vascular clips in the upper abdomen.  IMPRESSION:  1.  Stable cardiomegaly, hyperinflation, and postoperative changes.   Original Report Authenticated By: D. Andria Rhein, MD    Ct Abdomen Pelvis W Contrast  06/22/2012  *RADIOLOGY REPORT*  Clinical Data: Short of breath, muscle pain  CT ABDOMEN AND PELVIS WITH CONTRAST  Technique:  Multidetector CT imaging of the abdomen and pelvis was performed following the standard protocol during bolus administration of intravenous contrast.  Contrast: OMNIPAQUE IOHEXOL 300 MG/ML  SOLN  Comparison: CT 06/13/2010  Findings: Lung bases are clear.  Small pericardial effusion.  No focal hepatic lesion.  There is mild intrahepatic and extrahepatic biliary dilatation likely related to prior cholecystectomy.  The pancreas is atrophic without evidence of acute inflammation.  The spleen, adrenal glands, and kidneys are unremarkable.  Stomach, small bowel, and cecum are normal.  There is a moderate volume stool throughout the colon.  No retroperitoneal or periportal lymphadenopathy.  Abdominal aorta is normal caliber.  There is no free fluid the pelvis.  Post hysterectomy anatomy. There are diverticula sigmoid colon of acute inflammation.  There are compression fractures at T11, T12  and L1.  The thoracic levels have augmentation.  Findings are unchanged from prior.  IMPRESSION:  1.  No acute findings in the abdomen or pelvis. 2.  Sigmoid diverticulosis of diverticulitis. 3.  Mild intra and extrahepatic biliary duct dilatation likely related to cholecystectomy. 4.  Essentially stable compression fracture of a lower thoracic and upper lumbar spine.   Original Report Authenticated By: Genevive Bi, M.D.     Scheduled Meds: . aspirin EC  81 mg Oral Daily  . atorvastatin  40 mg Oral q1800  . Chlorhexidine Gluconate Cloth  6 each Topical Q0600  . citalopram  20 mg Oral Daily  . fenofibrate  160 mg Oral Daily  . furosemide  40 mg Oral BID  . isosorbide mononitrate  30 mg Oral Daily  . levofloxacin  500 mg Oral Q1200  . LORazepam  0.5 mg Oral QID  . metoprolol tartrate  12.5 mg  Oral BID  . mometasone-formoterol  2 puff Inhalation BID  . montelukast  10 mg Oral QHS  . mupirocin ointment  1 application Nasal BID  . pantoprazole  40 mg Oral Daily  . potassium chloride  10 mEq Oral Daily  . regadenoson  0.4 mg Intravenous Once  . sodium chloride  3 mL Intravenous Q12H  . tiotropium  18 mcg Inhalation Daily   Continuous Infusions:    Principal Problem:   Unstable angina Active Problems:   HYPERLIPIDEMIA   SMOKER   DEEP VEIN THROMBOSIS/PHLEBITIS   RESPIRATORY FAILURE, CHRONIC   SLEEP APNEA   COPD (chronic obstructive pulmonary disease)   Chronic combined systolic and diastolic CHF (congestive heart failure)   Coronary artery disease    REDDY,SRIKAR A, MD  [facility] Pager [phone]. If 7PM-7AM, please contact night-coverage at [email or web address], password [facility] 06/24/2012, 9:14 AM  LOS: 2 days

## 2012-06-24 NOTE — Progress Notes (Signed)
ANTICOAGULATION CONSULT NOTE - Follow Up Consult  Pharmacy Consult for heparin Indication: CAD and h/o DVT  Allergies  Allergen Reactions  . Aspirin Other (See Comments)    Patient states no reaction, only told not to take while on other blood thinner  . Penicillins Hives and Itching    Patient Measurements: Height: 5\' 4"  (162.6 cm) Weight: 133 lb 9.6 oz (60.6 kg) IBW/kg (Calculated) : 54.7  Vital Signs: Temp: 98.5 F (36.9 C) (03/07 0100) Temp src: Oral (03/07 0100) BP: 114/66 mmHg (03/07 0100) Pulse Rate: 83 (03/07 0100)  Labs:  Recent Labs  06/22/12 2005  06/23/12 0630 06/23/12 1100 06/23/12 1757 06/24/12 0012  HGB 11.4*  --   --   --   --   --   HCT 34.6*  --   --   --   --   --   PLT 211  --   --   --   --   --   APTT  --   --   --   --   --  69*  HEPARINUNFRC  --   --   --   --   --  0.83*  CREATININE 0.57  --   --   --   --   --   TROPONINI <0.30  < > <0.30 <0.30 <0.30  --   < > = values in this interval not displayed.  Estimated Creatinine Clearance: 58.9 ml/min (by C-G formula based on Cr of 0.57).   Medical History: Past Medical History  Diagnosis Date  . Chronic combined systolic and diastolic CHF (congestive heart failure)     a. 11/2011 Echo: EF 40-45% w/ Gr 1 DD.  Marland Kitchen Stroke   . Deep vein thrombosis     a. Chronic Xarelto.  . Hyperlipidemia   . Sleep apnea   . 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 Coronary artery disease     a. reportedly s/p MI x 2;  b. 2012 Myoview: EF 50% with a large lateral defect consistent with prior myocardial infarction-> Med Rx.  . Chronic pain     due to prior hip fracture and DDD back  . Shortness of breath     Medications:  Prescriptions prior to admission  Medication Sig Dispense Refill  . albuterol (ACCUNEB) 0.63 MG/3ML nebulizer solution Take 1 ampule by nebulization every 6 (six) hours as needed. For wheezing      . atorvastatin  (LIPITOR) 40 MG tablet Take 40 mg by mouth daily.      . citalopram (CELEXA) 20 MG tablet Take 20 mg by mouth daily.       . clonazePAM (KLONOPIN) 0.5 MG tablet Take 1 mg by mouth 3 (three) times daily as needed. For nervousness      . fenofibrate 160 MG tablet Take 160 mg by mouth daily.       . Fluticasone-Salmeterol (ADVAIR) 250-50 MCG/DOSE AEPB Inhale 1 puff into the lungs every 12 (twelve) hours.      . folic acid (FOLVITE) 1 MG tablet Take 1 mg by mouth daily.      . furosemide (LASIX) 80 MG tablet Take 40 mg by mouth 2 (two) times daily.      Marland Kitchen ipratropium-albuterol (DUONEB) 0.5-2.5 (3) MG/3ML SOLN Take 3 mLs by nebulization 3 (three) times daily. For shortness of breath or wheezing  360 mL  3  . isosorbide mononitrate (  IMDUR) 30 MG 24 hr tablet Take 1 tablet (30 mg total) by mouth daily.  30 tablet  0  . levofloxacin (LEVAQUIN) 750 MG tablet Take 1 tablet (750 mg total) by mouth daily.  4 tablet  0  . metoprolol tartrate (LOPRESSOR) 25 MG tablet Take 25 mg by mouth 2 (two) times daily.      . montelukast (SINGULAIR) 10 MG tablet Take 10 mg by mouth at bedtime.      Marland Kitchen omeprazole (PRILOSEC) 20 MG capsule Take 20 mg by mouth daily.       . ondansetron (ZOFRAN) 4 MG tablet Take 1 tablet (4 mg total) by mouth every 6 (six) hours.  12 tablet  0  . oxyCODONE-acetaminophen (PERCOCET) 10-325 MG per tablet Take 1 tablet by mouth every 6 (six) hours as needed for pain. For pain  30 tablet  0  . potassium chloride (K-DUR,KLOR-CON) 10 MEQ tablet Take 1 tablet (10 mEq total) by mouth daily.  60 tablet  0  . promethazine (PHENERGAN) 25 MG tablet Take 0.5-1 tablets (12.5-25 mg total) by mouth every 6 (six) hours as needed for nausea. For nausea  30 tablet  0  . Rivaroxaban (XARELTO) 20 MG TABS Take 20 mg by mouth daily.      Marland Kitchen tiotropium (SPIRIVA) 18 MCG inhalation capsule Place 18 mcg into inhaler and inhale daily.       . nitroGLYCERIN (NITROSTAT) 0.4 MG SL tablet Place 0.4 mg under the tongue every 5  (five) minutes as needed. For chest pain       Scheduled:  . [COMPLETED] aspirin EC  243 mg Oral Once  . [COMPLETED] aspirin EC  81 mg Oral Once  . aspirin EC  81 mg Oral Daily  . atorvastatin  40 mg Oral q1800  . Chlorhexidine Gluconate Cloth  6 each Topical Q0600  . citalopram  20 mg Oral Daily  . fenofibrate  160 mg Oral Daily  . [COMPLETED] furosemide  10 mg Intravenous Once  . furosemide  40 mg Oral BID  . isosorbide mononitrate  30 mg Oral Daily  . levofloxacin  500 mg Oral Q1200  . LORazepam  0.5 mg Oral QID  . metoprolol tartrate  12.5 mg Oral BID  . mometasone-formoterol  2 puff Inhalation BID  . montelukast  10 mg Oral QHS  . mupirocin ointment  1 application Nasal BID  . pantoprazole  40 mg Oral Daily  . potassium chloride  10 mEq Oral Daily  . regadenoson  0.4 mg Intravenous Once  . sodium chloride  3 mL Intravenous Q12H  . tiotropium  18 mcg Inhalation Daily  . [DISCONTINUED] aspirin  81 mg Oral Daily  . [DISCONTINUED] aspirin  325 mg Oral Daily  . [DISCONTINUED] clopidogrel  75 mg Oral Once    Assessment: 67yo female c/o abdominal pain and chest pressure with SOB, has prior h/o MI, initial CE negative, to transition from Xarelto to heparin for possible intervention.   APTT (69) is at-goal on 900 units/hr. Heparin level of 0.83 may be due to residual rivaroxaban effect. No baseline heparin level to determine initial rivaroxaban effect on heparin level. Last rivaroxaban dose 3/5 at 18:00. Will continue to monitor heparin level and aPTT for now. No bleeding per RN.   Goal of Therapy:  APTT 66 - 102 seconds Heparin level 0.3-0.7 units/ml Monitor platelets by anticoagulation protocol: Yes   Plan:  1. Continue IV heparin at 900 units/hr.  2. Heparin level, APTT in 6  hours  Lorre Munroe, PharmD  06/24/2012,1:20 AM

## 2012-06-24 NOTE — Progress Notes (Signed)
ANTICOAGULATION CONSULT NOTE - Follow Up Consult  Pharmacy Consult for heparin Indication: CAD and h/o DVT  Allergies  Allergen Reactions  . Aspirin Other (See Comments)    Patient states no reaction, only told not to take while on other blood thinner  . Penicillins Hives and Itching    Patient Measurements: Height: 5\' 4"  (162.6 cm) Weight: 133 lb 9.6 oz (60.6 kg) IBW/kg (Calculated) : 54.7  Vital Signs: Temp: 97.8 F (36.6 C) (03/07 0814) Temp src: Oral (03/07 0814) BP: 108/49 mmHg (03/07 0814) Pulse Rate: 87 (03/07 0814)  Labs:  Recent Labs  06/22/12 2005  06/23/12 0630 06/23/12 1100 06/23/12 1757 06/24/12 0012 06/24/12 0730  HGB 11.4*  --   --   --   --   --  10.8*  HCT 34.6*  --   --   --   --   --  32.7*  PLT 211  --   --   --   --   --  189  APTT  --   --   --   --   --  69* 85*  HEPARINUNFRC  --   --   --   --   --  0.83* 1.03*  CREATININE 0.57  --   --   --   --   --  0.58  TROPONINI <0.30  < > <0.30 <0.30 <0.30  --   --   < > = values in this interval not displayed.  Estimated Creatinine Clearance: 58.9 ml/min (by C-G formula based on Cr of 0.58).   Medical History: Past Medical History  Diagnosis Date  . Chronic combined systolic and diastolic CHF (congestive heart failure)     a. 11/2011 Echo: EF 40-45% w/ Gr 1 DD.  Marland Kitchen Stroke   . Deep vein thrombosis     a. Chronic Xarelto.  . Hyperlipidemia   . Sleep apnea   . 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 Coronary artery disease     a. reportedly s/p MI x 2;  b. 2012 Myoview: EF 50% with a large lateral defect consistent with prior myocardial infarction-> Med Rx.  . Chronic pain     due to prior hip fracture and DDD back  . Shortness of breath     Medications:  Prescriptions prior to admission  Medication Sig Dispense Refill  . albuterol (ACCUNEB) 0.63 MG/3ML nebulizer solution Take 1 ampule by nebulization every 6 (six) hours  as needed. For wheezing      . atorvastatin (LIPITOR) 40 MG tablet Take 40 mg by mouth daily.      . citalopram (CELEXA) 20 MG tablet Take 20 mg by mouth daily.       . clonazePAM (KLONOPIN) 0.5 MG tablet Take 1 mg by mouth 3 (three) times daily as needed. For nervousness      . fenofibrate 160 MG tablet Take 160 mg by mouth daily.       . Fluticasone-Salmeterol (ADVAIR) 250-50 MCG/DOSE AEPB Inhale 1 puff into the lungs every 12 (twelve) hours.      . folic acid (FOLVITE) 1 MG tablet Take 1 mg by mouth daily.      . furosemide (LASIX) 80 MG tablet Take 40 mg by mouth 2 (two) times daily.      Marland Kitchen ipratropium-albuterol (DUONEB) 0.5-2.5 (3) MG/3ML SOLN Take 3 mLs by nebulization 3 (three) times daily. For shortness of breath  or wheezing  360 mL  3  . isosorbide mononitrate (IMDUR) 30 MG 24 hr tablet Take 1 tablet (30 mg total) by mouth daily.  30 tablet  0  . levofloxacin (LEVAQUIN) 750 MG tablet Take 1 tablet (750 mg total) by mouth daily.  4 tablet  0  . metoprolol tartrate (LOPRESSOR) 25 MG tablet Take 25 mg by mouth 2 (two) times daily.      . montelukast (SINGULAIR) 10 MG tablet Take 10 mg by mouth at bedtime.      Marland Kitchen omeprazole (PRILOSEC) 20 MG capsule Take 20 mg by mouth daily.       . ondansetron (ZOFRAN) 4 MG tablet Take 1 tablet (4 mg total) by mouth every 6 (six) hours.  12 tablet  0  . oxyCODONE-acetaminophen (PERCOCET) 10-325 MG per tablet Take 1 tablet by mouth every 6 (six) hours as needed for pain. For pain  30 tablet  0  . potassium chloride (K-DUR,KLOR-CON) 10 MEQ tablet Take 1 tablet (10 mEq total) by mouth daily.  60 tablet  0  . promethazine (PHENERGAN) 25 MG tablet Take 0.5-1 tablets (12.5-25 mg total) by mouth every 6 (six) hours as needed for nausea. For nausea  30 tablet  0  . Rivaroxaban (XARELTO) 20 MG TABS Take 20 mg by mouth daily.      Marland Kitchen tiotropium (SPIRIVA) 18 MCG inhalation capsule Place 18 mcg into inhaler and inhale daily.       . nitroGLYCERIN (NITROSTAT) 0.4 MG SL  tablet Place 0.4 mg under the tongue every 5 (five) minutes as needed. For chest pain       Scheduled:  . aspirin EC  81 mg Oral Daily  . atorvastatin  40 mg Oral q1800  . Chlorhexidine Gluconate Cloth  6 each Topical Q0600  . citalopram  20 mg Oral Daily  . fenofibrate  160 mg Oral Daily  . furosemide  40 mg Oral BID  . isosorbide mononitrate  30 mg Oral Daily  . levofloxacin  500 mg Oral Q1200  . LORazepam  0.5 mg Oral QID  . metoprolol tartrate  12.5 mg Oral BID  . mometasone-formoterol  2 puff Inhalation BID  . montelukast  10 mg Oral QHS  . mupirocin ointment  1 application Nasal BID  . pantoprazole  40 mg Oral Daily  . potassium chloride  10 mEq Oral Daily  . regadenoson  0.4 mg Intravenous Once  . sodium chloride  3 mL Intravenous Q12H  . tiotropium  18 mcg Inhalation Daily    Assessment: 67yo female c/o abdominal pain and chest pressure with SOB, has prior h/o MI, initial CE negative. Transitioned from Xarelto to heparin for possible intervention.   APTT = 85 which is at-goal on 900 units/hr. Heparin level of 1.03 likely elevated due to residual rivaroxaban effect. No baseline heparin level to determine initial rivaroxaban effect on heparin level. Last rivaroxaban dose 3/5 at 18:00. No bleeding noted per RN's report. PLTC 189K, Hgb 10.8. Will continue current rate of IV heparin infusion and continue to monitor heparin level and aPTT for now.   Goal of Therapy:  APTT 66 - 102 seconds Heparin level 0.3-0.7 units/ml Monitor platelets by anticoagulation protocol: Yes   Plan:  1. Continue IV heparin at 900 units/hr.  2. Heparin level, APTT in 6 hours  Noah Delaine, RPh Clinical Pharmacist Pager: [phone] 06/24/2012,9:04 AM

## 2012-06-25 LAB — BASIC METABOLIC PANEL
BUN: <3 mg/dL — ABNORMAL LOW (ref 6–23)
CO2: 37 mEq/L — ABNORMAL HIGH (ref 19–32)
Calcium: 8.9 mg/dL (ref 8.4–10.5)
Chloride: 96 mEq/L (ref 96–112)
Creatinine, Ser: 0.7 mg/dL (ref 0.50–1.10)
GFR calc Af Amer: >90 mL/min (ref >90)
GFR calc non Af Amer: 88 mL/min — ABNORMAL LOW (ref >90)
Glucose, Bld: 83 mg/dL (ref 70–99)
Potassium: 3 mEq/L — ABNORMAL LOW (ref 3.5–5.1)
Sodium: 140 mEq/L (ref 135–145)

## 2012-06-25 LAB — CBC
HCT: 30.8 % — ABNORMAL LOW (ref 36.0–46.0)
MCV: 90.6 fL (ref 78.0–100.0)
RBC: 3.4 MIL/uL — ABNORMAL LOW (ref 3.87–5.11)
RDW: 14.6 % (ref 11.5–15.5)
WBC: 6.3 10*3/uL (ref 4.0–10.5)

## 2012-06-25 LAB — MAGNESIUM: Magnesium: 1.5 mg/dL (ref 1.5–2.5)

## 2012-06-25 MED ORDER — GUAIFENESIN ER 600 MG PO TB12
1200.0000 mg | ORAL_TABLET | Freq: Two times a day (BID) | ORAL | Status: DC
  Administered 2012-06-25 – 2012-06-28 (×7): 1200 mg via ORAL
  Filled 2012-06-25 (×10): qty 2

## 2012-06-25 MED ORDER — POTASSIUM CHLORIDE CRYS ER 20 MEQ PO TBCR
40.0000 meq | EXTENDED_RELEASE_TABLET | Freq: Once | ORAL | Status: AC
  Administered 2012-06-25: 09:00:00 40 meq via ORAL
  Filled 2012-06-25: qty 2

## 2012-06-25 MED ORDER — POTASSIUM CHLORIDE CRYS ER 20 MEQ PO TBCR
40.0000 meq | EXTENDED_RELEASE_TABLET | Freq: Every day | ORAL | Status: DC
  Administered 2012-06-25: 40 meq via ORAL
  Filled 2012-06-25 (×2): qty 2

## 2012-06-25 NOTE — Progress Notes (Signed)
ANTICOAGULATION CONSULT NOTE - Follow Up Consult  Pharmacy Consult for Heparin bridging Indication: Chest pain with history of DVT and chronic anticoagulation with Xarelto  Heparin Dosing Weight: 60 kg  Labs:  Recent Labs  06/22/12 2005  06/24/12 0012 06/24/12 0730 06/24/12 1524 06/25/12 0640  HGB 11.4*  --   --  10.8*  --  10.5*  HCT 34.6*  --   --  32.7*  --  30.8*  PLT 211  --   --  189  --  194  APTT  --   --  69* 85* 77*  --   HEPARINUNFRC  --   < > 0.83* 1.03* 0.52 0.62  CREATININE 0.57  --   --  0.58  --  0.70  < > = values in this interval not displayed.  Medications:  Infusions:  . heparin 900 Units/hr (06/24/12 2259)   Assessment:  67 y/o female transitioned from Xarelto to Heparin bridging while awaiting decision regarding need for cardiac cath.  Heparin within therapeutic range  No bleeding complications noted   Goal of Therapy:   Heparin level 0.3-0.7 units/ml   Plan:  Continue Heparin at 900 units/hr.  Daily Heparin level and CBC while on Heparin.   Stramoski, Deetta Perla.D 06/25/2012, 11:41 AM

## 2012-06-25 NOTE — Progress Notes (Addendum)
Patient: Aimee Sanders / Admit Date: 06/22/2012 / Date of Encounter: 06/25/2012, 7:57 AM   Subjective  There was difficulty with loading the nuclear study. Dr. Myrtis Ser reports that Speech Q program will not let him do a report, but images were reviewed and somewhat confusing - he notes small LV, dense scar IL and AL walls yet EF calc at 65%, also septal ischemia. Clinical correlation recommended.  Pt reports nausea all night. Has had the same constant chest pain/pressure and L breast pain since the last few days. Pain is worse with inspiration and palpation. She thinks her SOB is somewhat better.   Objective   Telemetry: NSR Physical Exam: Filed Vitals:   06/25/12 0700  BP: 72/27  Pulse: 72  Temp: 98.6 F (37 C)  Resp: 20   General: Chronically ill app F in no acute distress. Head: Normocephalic, atraumatic, sclera non-icteric, no xanthomas, nares are without discharge. Neck: Negative for carotid bruits. JVD not elevated. Lungs: Breathing unlabored. Diminished BS throughout with diffuse exp wheezing. Heart: RRR S1 S2 without murmurs, rubs, or gallops.  Abdomen: Soft, non-tender, non-distended with normoactive bowel sounds. No hepatomegaly. No rebound/guarding. No obvious abdominal masses. Msk:  Strength and tone appear normal for age. Extremities: No clubbing or cyanosis. No edema.  Distal pedal pulses are 2+ and equal bilaterally. Neuro: Alert and oriented X 3. Moves all extremities spontaneously. Psych:  Responds to questions appropriately with a normal affect.    Intake/Output Summary (Last 24 hours) at 06/25/12 0757 Last data filed at 06/24/12 2332  Gross per 24 hour  Intake 898.13 ml  Output   2202 ml  Net -1303.87 ml    Inpatient Medications:  . aspirin EC  81 mg Oral Daily  . atorvastatin  40 mg Oral q1800  . Chlorhexidine Gluconate Cloth  6 each Topical Q0600  . citalopram  20 mg Oral Daily  . fenofibrate  160 mg Oral Daily  . furosemide  40 mg Oral BID  .  isosorbide mononitrate  30 mg Oral Daily  . levofloxacin  500 mg Oral Q1200  . LORazepam  0.5 mg Oral QID  . metoprolol tartrate  12.5 mg Oral BID  . mometasone-formoterol  2 puff Inhalation BID  . montelukast  10 mg Oral QHS  . mupirocin ointment  1 application Nasal BID  . pantoprazole  40 mg Oral Daily  . potassium chloride  10 mEq Oral Daily  . sodium chloride  3 mL Intravenous Q12H  . tiotropium  18 mcg Inhalation Daily    Labs:  Recent Labs  06/24/12 0730 06/25/12 0640  NA 142 140  K 3.2* 3.0*  CL 96 96  CO2 38* 37*  GLUCOSE 80 83  BUN 4* PENDING  CREATININE 0.58 0.70  CALCIUM 8.8 8.9  MG  --  1.5    Recent Labs  06/22/12 2005  AST 28  ALT 12  ALKPHOS 86  BILITOT 0.2*  PROT 5.4*  ALBUMIN 2.2*    Recent Labs  06/22/12 2005 06/24/12 0730 06/25/12 0640  WBC 7.9 7.7 6.3  NEUTROABS 3.4  --   --   HGB 11.4* 10.8* 10.5*  HCT 34.6* 32.7* 30.8*  MCV 91.8 92.1 90.6  PLT 211 189 194    Recent Labs  06/22/12 2318 06/23/12 0630 06/23/12 1100 06/23/12 1757  TROPONINI <0.30 <0.30 <0.30 <0.30    Recent Labs  06/23/12 0630  CHOL 113  HDL 70  LDLCALC 23  TRIG 101  CHOLHDL 1.6  Radiology/Studies:  Dg Chest 2 View  06/22/2012  *RADIOLOGY REPORT*  Clinical Data: Shortness of breath, chest pain  CHEST - 2 VIEW  Comparison: 06/04/2012  Findings: Changes of vertebral augmentation in the mid and lower thoracic spine.  Diffuse osteopenia.  Stable compression deformity near the thoracolumbar junction.  Lungs are hyperinflated, clear. Mild cardiomegaly.  No effusion. Vascular clips in the upper abdomen.  IMPRESSION:  1.  Stable cardiomegaly, hyperinflation, and postoperative changes.   Original Report Authenticated By: D. Andria Rhein, MD    Dg Ribs Unilateral W/chest Left  06/04/2012  *RADIOLOGY REPORT*  Clinical Data: Abdominal pain.  Emesis.  Left chest pain.  LEFT RIBS AND CHEST - 3+ VIEW  Comparison: 04/01/2012  Findings: Hyperaeration.  Mild cardiomegaly.   Normal vascularity. Stable left basilar atelectasis or scar.  Old left rib fractures with callus formation are noted.  No definite acute rib fractures. The patient is status post vertebroplasty at multiple thoracic levels. There is a cortical step off involving a mid right rib laterally.  Age of this fracture is indeterminate.  IMPRESSION: Old left-sided rib fractures.  No definite acute left rib fracture.  Right rib fracture of indeterminate age.  Hyperaeration is likely related to COPD.  Left basilar scar.   Original Report Authenticated By: Jolaine Click, M.D.    Ct Abdomen Pelvis W Contrast  06/22/2012  *RADIOLOGY REPORT*  Clinical Data: Short of breath, muscle pain  CT ABDOMEN AND PELVIS WITH CONTRAST  Technique:  Multidetector CT imaging of the abdomen and pelvis was performed following the standard protocol during bolus administration of intravenous contrast.  Contrast: OMNIPAQUE IOHEXOL 300 MG/ML  SOLN  Comparison: CT 06/13/2010  Findings: Lung bases are clear.  Small pericardial effusion.  No focal hepatic lesion.  There is mild intrahepatic and extrahepatic biliary dilatation likely related to prior cholecystectomy.  The pancreas is atrophic without evidence of acute inflammation.  The spleen, adrenal glands, and kidneys are unremarkable.  Stomach, small bowel, and cecum are normal.  There is a moderate volume stool throughout the colon.  No retroperitoneal or periportal lymphadenopathy.  Abdominal aorta is normal caliber.  There is no free fluid the pelvis.  Post hysterectomy anatomy. There are diverticula sigmoid colon of acute inflammation.  There are compression fractures at T11, T12 and L1.  The thoracic levels have augmentation.  Findings are unchanged from prior.  IMPRESSION:  1.  No acute findings in the abdomen or pelvis. 2.  Sigmoid diverticulosis of diverticulitis. 3.  Mild intra and extrahepatic biliary duct dilatation likely related to cholecystectomy. 4.  Essentially stable compression  fracture of a lower thoracic and upper lumbar spine.   Original Report Authenticated By: Genevive Bi, M.D.      Assessment and Plan  67 yo female with known CAD, chronic combined CHF, COPD (on chronic O2), RLE DVT (on Xarelto), ongoing tobacco abuse, h/o CVA and OSA c/o intermittent chest pressure x 3 weeks with new EKG changes.   1. CAD/Unstable angina with new EKG changes  2. Chronic combined CHF  3. Chronic respiratory failure 2/2 O2-dependent COPD, OSA and # 2  4. Cough/bronchitis  5. H/o RLE DVT, on Xarelto at home 6. Ongoing tobacco abuse  7. H/o CVA  8. Hypokalemia  9. Normocytic anemia  Will discuss myoview results with MD. Prior history of difficult cath access and prior notes indicate that she would be poor candidate for invasive procedures. Symptoms somewhat pleuritic-sounding as opposed to typical ACS chest pain, but has also  had dull pressure for several weeks. CE's neg x 3. Has diuresed 9 lbs on home dose of Lasix and neg 4L this admission. She reports compliance with Lasix at home. Will give KCl this AM and increase regularly scheduled KCl dose to daily.   Signed, Ronie Spies PA-C As above; patient seen and examined; very difficult situation; she continues to have chest pain but enzymes neg; myoview technically difficult yesterday but suggests septal ischemia per JK. I would like to treat medically if possible. Transfer to telemetry and ambulate; will reassess in AM. Continue present dose of lasix. Supplement K. Olga Millers

## 2012-06-25 NOTE — Progress Notes (Signed)
[facility] PROGRESS NOTE  Aimee Sanders QIH:[id number] DOB: 11/23/1945 DOA: 06/22/2012 PCP: Bennie Pierini, FNP  Assessment/Plan: Unstable angina with EKG changes, known CAD Cardiac enzymes negative thus far. Although pain reported to be chronic and reproducable on palpation concerning for musculoskeletal pain. On Xarelto, last dose 3/5 at 6 PM, currently on heparin drip, if no interventions planned per cardiology transition to Xarelto. Continue aspirin, tolerated dose on 06/23/2012 though she has an allergy to aspirin. Plavix discontinued by cardiology. Cardiology, following the patient, planning on stress test this morning. Continue Imdur, statin, and metoprolol. Stress test performed yesterday concern for possible septal ischemia, transfer to telemetry, cardiology deciding on patient's management.  Cough/Bronchitis Continue levofloxacin for a total of 5 days, started on 06/23/2012. Complicated by tobacco use. Counseled on cessation.  Left-sided abdominal pain? Exam benign. CT abdomen and pelvis unremarkable. Suspect referred pain. Currently not complaining of abdominal pain.  History of systolic CHF, appears compensated 11/2011 ejection fraction of 40-45%, inferolateral hypokinesis. Continue Lasix.  Oxygen-dependent COPD/Chronic respiratory failure Stable. Continue Advair, Spiriva, nebulizer treatments, Singulair.  History of DVT On Xarelto as outpatient, on hold pending further evaluation. Continue heparin drip. History of intravenous streptokinase.  History of stroke Stable  OSA Cannot tolerate CPAP.  Ongoing cigarette use Smoking cessation counseled.  Hypokalemia Replace as needed.  Code Status: Full code  Family Communication: Family not present.  Disposition Plan: Pending.  Consultants:  Cardiology  Procedures:  As above.  Antibiotics:  Levofloxacin 06/23/2012 >>  HPI/Subjective: Complaining of cough this morning, she feels improved from the day of  admission.  Objective: Filed Vitals:   06/25/12 0515 06/25/12 0700 06/25/12 0842 06/25/12 1046  BP: 94/54 72/27 112/55   Pulse: 71 72    Temp:  98.6 F (37 C)    TempSrc:  Oral    Resp:  20    Height:      Weight:      SpO2: 92% 95%  94%    Intake/Output Summary (Last 24 hours) at 06/25/12 1100 Last data filed at 06/25/12 0900  Gross per 24 hour  Intake 1138.13 ml  Output   1902 ml  Net -763.87 ml   Filed Weights   06/23/12 0217 06/24/12 0100 06/25/12 0400  Weight: 61.5 kg (135 lb 9.3 oz) 60.6 kg (133 lb 9.6 oz) 57.4 kg (126 lb 8.7 oz)    Exam: Physical Exam: General: Awake, Oriented, No acute distress. HEENT: EOMI. Neck: Supple CV: S1 and S2 Lungs: Coarse breath sounds with scattered wheezing with good air movement. Abdomen: Soft, Nontender, Nondistended, +bowel sounds. Ext: Good pulses. Trace edema.  Data Reviewed: Basic Metabolic Panel:  Recent Labs Lab 06/22/12 2005 06/24/12 0730 06/25/12 0640  NA 143 142 140  K 3.5 3.2* 3.0*  CL 102 96 96  CO2 34* 38* 37*  GLUCOSE 92 80 83  BUN 7 4* <3*  CREATININE 0.57 0.58 0.70  CALCIUM 8.3* 8.8 8.9  MG  --   --  1.5   Liver Function Tests:  Recent Labs Lab 06/22/12 2005  AST 28  ALT 12  ALKPHOS 86  BILITOT 0.2*  PROT 5.4*  ALBUMIN 2.2*    Recent Labs Lab 06/22/12 2005  LIPASE 25   No results found for this basename: AMMONIA,  in the last 168 hours CBC:  Recent Labs Lab 06/22/12 2005 06/24/12 0730 06/25/12 0640  WBC 7.9 7.7 6.3  NEUTROABS 3.4  --   --   HGB 11.4* 10.8* 10.5*  HCT 34.6* 32.7*  30.8*  MCV 91.8 92.1 90.6  PLT 211 189 194   Cardiac Enzymes:  Recent Labs Lab 06/22/12 2005 06/22/12 2318 06/23/12 0630 06/23/12 1100 06/23/12 1757  TROPONINI <0.30 <0.30 <0.30 <0.30 <0.30   BNP (last 3 results)  Recent Labs  01/01/12 1612 02/08/12 1121 06/22/12 2005  PROBNP 2063.0* 1958.0* 3562.0*   CBG: No results found for this basename: GLUCAP,  in the last 168  hours  Recent Results (from the past 240 hour(s))  MRSA PCR SCREENING     Status: Abnormal   Collection Time    06/23/12  6:34 AM      Result Value Range Status   MRSA by PCR POSITIVE (*) NEGATIVE Final   Comment:            The GeneXpert MRSA Assay (FDA     approved for NASAL specimens     only), is one component of a     comprehensive MRSA colonization     surveillance program. It is not     intended to diagnose MRSA     infection nor to guide or     monitor treatment for     MRSA infections.     RESULT CALLED TO, READ BACK BY AND VERIFIED WITH:     C HAGERMAN, RN 06/23/12 0830 BY KSCHULTZ     Studies: No results found.  Scheduled Meds: . aspirin EC  81 mg Oral Daily  . atorvastatin  40 mg Oral q1800  . Chlorhexidine Gluconate Cloth  6 each Topical Q0600  . citalopram  20 mg Oral Daily  . fenofibrate  160 mg Oral Daily  . furosemide  40 mg Oral BID  . isosorbide mononitrate  30 mg Oral Daily  . levofloxacin  500 mg Oral Q1200  . LORazepam  0.5 mg Oral QID  . metoprolol tartrate  12.5 mg Oral BID  . mometasone-formoterol  2 puff Inhalation BID  . montelukast  10 mg Oral QHS  . mupirocin ointment  1 application Nasal BID  . pantoprazole  40 mg Oral Daily  . potassium chloride  40 mEq Oral Daily  . sodium chloride  3 mL Intravenous Q12H  . tiotropium  18 mcg Inhalation Daily   Continuous Infusions: . heparin 900 Units/hr (06/24/12 2259)    Principal Problem:   Unstable angina Active Problems:   HYPERLIPIDEMIA   SMOKER   DEEP VEIN THROMBOSIS/PHLEBITIS   RESPIRATORY FAILURE, CHRONIC   SLEEP APNEA   COPD (chronic obstructive pulmonary disease)   Chronic combined systolic and diastolic CHF (congestive heart failure)   Coronary artery disease    REDDY,SRIKAR A, MD  [facility] Pager [phone]. If 7PM-7AM, please contact night-coverage at [email or web address], password [facility] 06/25/2012, 11:00 AM  LOS: 3 days

## 2012-06-26 LAB — CBC
Hemoglobin: 10.5 g/dL — ABNORMAL LOW (ref 12.0–15.0)
MCH: 30.3 pg (ref 26.0–34.0)
MCV: 91.9 fL (ref 78.0–100.0)
Platelets: 214 10*3/uL (ref 150–400)
RBC: 3.46 MIL/uL — ABNORMAL LOW (ref 3.87–5.11)
WBC: 6.6 10*3/uL (ref 4.0–10.5)

## 2012-06-26 LAB — HEPARIN LEVEL (UNFRACTIONATED): Heparin Unfractionated: 0.6 IU/mL (ref 0.30–0.70)

## 2012-06-26 LAB — BASIC METABOLIC PANEL
BUN: 3 mg/dL — ABNORMAL LOW (ref 6–23)
Calcium: 8.4 mg/dL (ref 8.4–10.5)
Creatinine, Ser: 0.76 mg/dL (ref 0.50–1.10)
GFR calc non Af Amer: 85 mL/min — ABNORMAL LOW (ref >90)
Glucose, Bld: 83 mg/dL (ref 70–99)

## 2012-06-26 MED ORDER — FUROSEMIDE 40 MG PO TABS
40.0000 mg | ORAL_TABLET | Freq: Every day | ORAL | Status: DC
  Administered 2012-06-26 – 2012-06-28 (×3): 40 mg via ORAL
  Filled 2012-06-26 (×3): qty 1

## 2012-06-26 MED ORDER — RIVAROXABAN 20 MG PO TABS
20.0000 mg | ORAL_TABLET | Freq: Every day | ORAL | Status: DC
  Administered 2012-06-26 – 2012-06-27 (×2): 20 mg via ORAL
  Filled 2012-06-26 (×3): qty 1

## 2012-06-26 MED ORDER — ALUM & MAG HYDROXIDE-SIMETH 200-200-20 MG/5ML PO SUSP
30.0000 mL | ORAL | Status: DC | PRN
  Administered 2012-06-26 – 2012-06-28 (×2): 30 mL via ORAL
  Filled 2012-06-26 (×3): qty 30

## 2012-06-26 MED ORDER — POTASSIUM CHLORIDE CRYS ER 20 MEQ PO TBCR
40.0000 meq | EXTENDED_RELEASE_TABLET | Freq: Two times a day (BID) | ORAL | Status: DC
  Administered 2012-06-26 – 2012-06-28 (×5): 40 meq via ORAL
  Filled 2012-06-26 (×5): qty 2

## 2012-06-26 MED ORDER — POTASSIUM CHLORIDE CRYS ER 20 MEQ PO TBCR: 40.0000 meq | EXTENDED_RELEASE_TABLET | Freq: Three times a day (TID) | ORAL | Status: DC

## 2012-06-26 NOTE — Progress Notes (Signed)
Subjective:  The patient continues to complain of some left-sided chest discomfort which is constant.  Her cardiac enzymes are negative.  Previous EKGs have shown lateral T wave inversion which have not evolved.  Will repeat EKG.  The patient remains very hypokalemic despite repletion yesterday.  Objective:  Vital Signs in the last 24 hours: Temp:  [98.6 F (37 C)-99.1 F (37.3 C)] 98.6 F (37 C) (03/09 0428) Pulse Rate:  [65-77] 68 (03/09 0428) Resp:  [18-20] 18 (03/09 0428) BP: (97-112)/(51-75) 100/75 mmHg (03/09 0428) SpO2:  [92 %-95 %] 92 % (03/09 0428) Weight:  [126 lb 11.2 oz (57.471 kg)] 126 lb 11.2 oz (57.471 kg) (03/09 0428)  Intake/Output from previous day: 03/08 0701 - 03/09 0700 In: 240 [P.O.:240] Out: 2600 [Urine:2600] Intake/Output from this shift:    . aspirin EC  81 mg Oral Daily  . atorvastatin  40 mg Oral q1800  . Chlorhexidine Gluconate Cloth  6 each Topical Q0600  . citalopram  20 mg Oral Daily  . fenofibrate  160 mg Oral Daily  . furosemide  40 mg Oral BID  . guaiFENesin  1,200 mg Oral BID  . isosorbide mononitrate  30 mg Oral Daily  . levofloxacin  500 mg Oral Q1200  . LORazepam  0.5 mg Oral QID  . metoprolol tartrate  12.5 mg Oral BID  . mometasone-formoterol  2 puff Inhalation BID  . montelukast  10 mg Oral QHS  . mupirocin ointment  1 application Nasal BID  . pantoprazole  40 mg Oral Daily  . potassium chloride  40 mEq Oral TID  . rivaroxaban  20 mg Oral Daily  . sodium chloride  3 mL Intravenous Q12H  . tiotropium  18 mcg Inhalation Daily      Physical Exam: General: Chronically ill app F in no acute distress.  Head: Normocephalic, atraumatic, sclera non-icteric, no xanthomas, nares are without discharge.  Neck: Negative for carotid bruits. JVD not elevated.  Lungs: Breathing unlabored. Diminished BS throughout with diffuse exp wheezing.  Heart: RRR S1 S2 without murmurs, rubs, or gallops.  Abdomen: Soft, non-tender, non-distended with  normoactive bowel sounds. No hepatomegaly. No rebound/guarding. No obvious abdominal masses.  Msk: Strength and tone appear normal for age.  Extremities: No clubbing or cyanosis. No edema. Distal pedal pulses are 2+ and equal bilaterally.  Neuro: Alert and oriented X 3. Moves all extremities spontaneously.  Psych: Responds to questions appropriately with a normal affect.    Lab Results:  Recent Labs  06/25/12 0640 06/26/12 0445  WBC 6.3 6.6  HGB 10.5* 10.5*  PLT 194 214    Recent Labs  06/25/12 0640 06/26/12 0445  NA 140 137  K 3.0* 3.0*  CL 96 93*  CO2 37* 36*  GLUCOSE 83 83  BUN <3* 3*  CREATININE 0.70 0.76    Recent Labs  06/23/12 1100 06/23/12 1757  TROPONINI <0.30 <0.30   Hepatic Function Panel No results found for this basename: PROT, ALBUMIN, AST, ALT, ALKPHOS, BILITOT, BILIDIR, IBILI,  in the last 72 hours No results found for this basename: CHOL,  in the last 72 hours No results found for this basename: PROTIME,  in the last 72 hours  Imaging: Imaging results have been reviewed  Cardiac Studies: Telemetry shows normal sinus rhythm.  Occasional PVCs. Assessment/Plan:  1. CAD/Unstable angina with new EKG changes, not evolving.  We'll recheck EKG today.  2. Chronic combined CHF .  She appears to be euvolemic now.  BUN and creatinine  remain low.  We'll decrease Lasix to once a day.  Blood pressure remains soft 3. Chronic respiratory failure 2/2 O2-dependent COPD, OSA and # 2  4. Cough/bronchitis  5. H/o RLE DVT, on Xarelto at home  6. Ongoing tobacco abuse  7. H/o CVA  8. Hypokalemia  9. Normocytic anemia  Discussion: Myoview study was technically difficult.  We will try to treat medically.  She does not have any evidence of myocardial damage by enzymes.  Today we will try to increase activity.  We'll increase potassium supplementation further.  We will switch her back from IV heparin to Xarelto.  LOS: 4 days    Cassell Clement 06/26/2012, 7:56  AM

## 2012-06-26 NOTE — Progress Notes (Signed)
[facility] PROGRESS NOTE  AMALIA EDGECOMBE RUE:[id number] DOB: [birth year] DOA: 06/22/2012 PCP: Bennie Pierini, FNP  Assessment/Plan: Unstable angina with EKG changes, known CAD Cardiac enzymes negative thus far. Although pain reported to be chronic and reproducable on palpation concerning for musculoskeletal pain. Resumed Xarelto, heparin drip discontinued. Continue aspirin, tolerated dose on 06/23/2012 though she has an allergy to aspirin. Plavix discontinued by cardiology. Cardiology, following the patient, stress test performed on 06/24/2012 concern for possible septal ischemia, transfer to telemetry, cardiology deciding on patient's management. Continue Imdur, statin, and metoprolol. Will mobilize with PT and see how she does.  Cough/Bronchitis Continue levofloxacin for a total of 5 days, started on 06/23/2012. Complicated by tobacco use. Counseled on cessation.  Left-sided abdominal pain? Exam benign. CT abdomen and pelvis unremarkable. Suspect referred pain.  History of systolic CHF, appears compensated 11/2011 ejection fraction of 40-45%, inferolateral hypokinesis. Continue Lasix.  Oxygen-dependent COPD/Chronic respiratory failure Stable. Continue Advair, Spiriva, nebulizer treatments, Singulair.  History of DVT On Xarelto as outpatient, on hold pending further evaluation. Continue heparin drip. History of intravenous streptokinase.  History of stroke Stable  OSA Cannot tolerate CPAP.  Ongoing cigarette use Smoking cessation counseled.  Hypokalemia Replace as needed.  Code Status: Full code  Family Communication: Family not present.  Disposition Plan: Pending.  Consultants:  Cardiology  Procedures:  As above.  Antibiotics:  Levofloxacin 06/23/2012 >>  HPI/Subjective: Had some chest pain this morning, which since then has improved. Complaining of left sided abdominal pain. Reports having poor appetite.  Objective: Filed Vitals:   06/25/12 2036 06/25/12  2105 06/26/12 0428 06/26/12 0841  BP: 104/61  100/75   Pulse: 77  68   Temp: 98.7 F (37.1 C)  98.6 F (37 C)   TempSrc: Oral  Oral   Resp: 18  18   Height:      Weight:   57.471 kg (126 lb 11.2 oz)   SpO2: 95% 94% 92% 93%    Intake/Output Summary (Last 24 hours) at 06/26/12 0950 Last data filed at 06/26/12 0300  Gross per 24 hour  Intake      0 ml  Output   2600 ml  Net  -2600 ml   Filed Weights   06/24/12 0100 06/25/12 0400 06/26/12 0428  Weight: 60.6 kg (133 lb 9.6 oz) 57.4 kg (126 lb 8.7 oz) 57.471 kg (126 lb 11.2 oz)    Exam: Physical Exam: General: Awake, Oriented, No acute distress. HEENT: EOMI. Neck: Supple CV: S1 and S2 Lungs: Good air movement, scattered breath sounds improved from admission. Abdomen: Soft, Nontender, Nondistended, +bowel sounds. Ext: Good pulses. Trace edema.  Data Reviewed: Basic Metabolic Panel:  Recent Labs Lab 06/22/12 2005 06/24/12 0730 06/25/12 0640 06/26/12 0445  NA 143 142 140 137  K 3.5 3.2* 3.0* 3.0*  CL 102 96 96 93*  CO2 34* 38* 37* 36*  GLUCOSE 92 80 83 83  BUN 7 4* <3* 3*  CREATININE 0.57 0.58 0.70 0.76  CALCIUM 8.3* 8.8 8.9 8.4  MG  --   --  1.5  --    Liver Function Tests:  Recent Labs Lab 06/22/12 2005  AST 28  ALT 12  ALKPHOS 86  BILITOT 0.2*  PROT 5.4*  ALBUMIN 2.2*    Recent Labs Lab 06/22/12 2005  LIPASE 25   No results found for this basename: AMMONIA,  in the last 168 hours CBC:  Recent Labs Lab 06/22/12 2005 06/24/12 0730 06/25/12 0640 06/26/12 0445  WBC 7.9 7.7 6.3  6.6  NEUTROABS 3.4  --   --   --   HGB 11.4* 10.8* 10.5* 10.5*  HCT 34.6* 32.7* 30.8* 31.8*  MCV 91.8 92.1 90.6 91.9  PLT 211 189 194 214   Cardiac Enzymes:  Recent Labs Lab 06/22/12 2005 06/22/12 2318 06/23/12 0630 06/23/12 1100 06/23/12 1757  TROPONINI <0.30 <0.30 <0.30 <0.30 <0.30   BNP (last 3 results)  Recent Labs  01/01/12 1612 02/08/12 1121 06/22/12 2005  PROBNP 2063.0* 1958.0* 3562.0*    CBG: No results found for this basename: GLUCAP,  in the last 168 hours  Recent Results (from the past 240 hour(s))  MRSA PCR SCREENING     Status: Abnormal   Collection Time    06/23/12  6:34 AM      Result Value Range Status   MRSA by PCR POSITIVE (*) NEGATIVE Final   Comment:            The GeneXpert MRSA Assay (FDA     approved for NASAL specimens     only), is one component of a     comprehensive MRSA colonization     surveillance program. It is not     intended to diagnose MRSA     infection nor to guide or     monitor treatment for     MRSA infections.     RESULT CALLED TO, READ BACK BY AND VERIFIED WITH:     C HAGERMAN, RN 06/23/12 0830 BY KSCHULTZ     Studies: No results found.  Scheduled Meds: . aspirin EC  81 mg Oral Daily  . atorvastatin  40 mg Oral q1800  . Chlorhexidine Gluconate Cloth  6 each Topical Q0600  . citalopram  20 mg Oral Daily  . fenofibrate  160 mg Oral Daily  . furosemide  40 mg Oral Daily  . guaiFENesin  1,200 mg Oral BID  . isosorbide mononitrate  30 mg Oral Daily  . levofloxacin  500 mg Oral Q1200  . LORazepam  0.5 mg Oral QID  . metoprolol tartrate  12.5 mg Oral BID  . mometasone-formoterol  2 puff Inhalation BID  . montelukast  10 mg Oral QHS  . mupirocin ointment  1 application Nasal BID  . pantoprazole  40 mg Oral Daily  . potassium chloride  40 mEq Oral BID  . rivaroxaban  20 mg Oral Q supper  . sodium chloride  3 mL Intravenous Q12H  . tiotropium  18 mcg Inhalation Daily   Continuous Infusions:    Principal Problem:   Unstable angina Active Problems:   HYPERLIPIDEMIA   SMOKER   DEEP VEIN THROMBOSIS/PHLEBITIS   RESPIRATORY FAILURE, CHRONIC   SLEEP APNEA   COPD (chronic obstructive pulmonary disease)   Chronic combined systolic and diastolic CHF (congestive heart failure)   Coronary artery disease    REDDY,SRIKAR A, MD  [facility] Pager [phone]. If 7PM-7AM, please contact night-coverage at [email or web address],  password [facility] 06/26/2012, 9:50 AM  LOS: 4 days

## 2012-06-26 NOTE — Evaluation (Signed)
Physical Therapy Evaluation Patient Details Name: Aimee Sanders MRN: [id number] DOB: [birth year] Today's Date: 06/26/2012 Time: 4098-1191 PT Time Calculation (min): 24 min  PT Assessment / Plan / Recommendation Clinical Impression  Patient is a 67 yo female admitted with chest pain and SOB.  Patient with general weakness and gait deviations impacting mobility.  Will benefit from acute PT to maximize independence prior to discharge.  Do not anticipate any f/u PT needs at discharge.    PT Assessment  Patient needs continued PT services    Follow Up Recommendations  No PT follow up;Supervision/Assistance - 24 hour    Does the patient have the potential to tolerate intense rehabilitation      Barriers to Discharge None      Equipment Recommendations  None recommended by PT    Recommendations for Other Services     Frequency Min 3X/week    Precautions / Restrictions Precautions Precautions: None Precaution Comments: Uses O2 at home at 2.5 l/min. Restrictions Weight Bearing Restrictions: No   Pertinent Vitals/Pain Dyspnea 3/4 with gait.      Mobility  Bed Mobility Bed Mobility: Supine to Sit;Sitting - Scoot to Edge of Bed;Sit to Supine Supine to Sit: 7: Independent;HOB elevated Sitting - Scoot to Edge of Bed: 7: Independent Sit to Supine: 7: Independent;HOB elevated Details for Bed Mobility Assistance: No cues or assist needed. Transfers Transfers: Sit to Stand;Stand to Sit Sit to Stand: 4: Min guard;With upper extremity assist;From bed Stand to Sit: 4: Min guard;With upper extremity assist;To bed Details for Transfer Assistance: Verbal cues for safe hand placement.  Assist for safety. Ambulation/Gait Ambulation/Gait Assistance: 4: Min guard Ambulation Distance (Feet): 142 Feet Assistive device: Rolling walker Ambulation/Gait Assistance Details: Verbal cues to slow gait speed to minimize dyspnea.  Encouraged upright stance during gait. Gait Pattern: Step-through  pattern;Decreased stride length Gait velocity: Encouraged patient to slow to safe pace that would minimize dyspnea with gait.    Exercises     PT Diagnosis: Difficulty walking;Abnormality of gait;Generalized weakness  PT Problem List: Decreased strength;Decreased activity tolerance;Decreased balance;Decreased mobility;Cardiopulmonary status limiting activity PT Treatment Interventions: DME instruction;Gait training;Stair training;Functional mobility training;Patient/family education   PT Goals Acute Rehab PT Goals PT Goal Formulation: With patient Time For Goal Achievement: 07/03/12 Potential to Achieve Goals: Good Pt will go Sit to Stand: with supervision;with upper extremity assist PT Goal: Sit to Stand - Progress: Goal set today Pt will Ambulate: 51 - 150 feet;with supervision;with rolling walker PT Goal: Ambulate - Progress: Goal set today Pt will Go Up / Down Stairs: 3-5 stairs;with min assist;with rail(s);with least restrictive assistive device PT Goal: Up/Down Stairs - Progress: Goal set today  Visit Information  Last PT Received On: 06/26/12 Assistance Needed: +1    Subjective Data  Subjective: Reports nausea is gone this pm. Patient Stated Goal: To return home soon.   Prior Functioning  Home Living Lives With: Spouse;Daughter (granddaughter) Available Help at Discharge: Family;Available 24 hours/day Type of Home: Mobile home Home Access: Stairs to enter Entrance Stairs-Number of Steps: 4 Entrance Stairs-Rails: Right;Left Home Layout: One level Bathroom Shower/Tub: Engineer, manufacturing systems: Standard Bathroom Accessibility: Yes How Accessible: Accessible via walker Home Adaptive Equipment: Wheelchair - manual;Walker - rolling;Straight cane;Shower chair with back;Bedside commode/3-in-1 Prior Function Level of Independence: Independent with assistive device(s);Needs assistance Needs Assistance: Meal Prep;Light Housekeeping Meal Prep: Maximal Light  Housekeeping: Total Able to Take Stairs?: Yes Driving: No Vocation: Retired Musician: No difficulties    Copywriter, advertising Overall Cognitive Status:  Appears within functional limits for tasks assessed/performed Arousal/Alertness: Awake/alert Orientation Level: Appears intact for tasks assessed Behavior During Session: [facility] for tasks performed    Extremity/Trunk Assessment Right Upper Extremity Assessment RUE ROM/Strength/Tone: [facility] for tasks assessed RUE Sensation: WFL - Light Touch Left Upper Extremity Assessment LUE ROM/Strength/Tone: WFL for tasks assessed LUE Sensation: WFL - Light Touch Right Lower Extremity Assessment RLE ROM/Strength/Tone: Deficits RLE ROM/Strength/Tone Deficits: Strength 4/5 RLE Sensation: WFL - Light Touch Left Lower Extremity Assessment LLE ROM/Strength/Tone: Deficits LLE ROM/Strength/Tone Deficits: Strength 4/5 LLE Sensation: WFL - Light Touch   Balance Balance Balance Assessed: Yes Static Sitting Balance Static Sitting - Balance Support: No upper extremity supported;Feet supported Static Sitting - Level of Assistance: 7: Independent Static Sitting - Comment/# of Minutes: 10  End of Session PT - End of Session Equipment Utilized During Treatment: Gait belt;Oxygen Activity Tolerance: Patient limited by fatigue (Dyspnea of 3/4 with gait) Patient left: in bed;with call bell/phone within reach Nurse Communication: Mobility status  GP     Vena Austria 06/26/2012, 3:50 PM Durenda Hurt. Renaldo Fiddler, [facility] Acute Rehab Services Pager [phone]

## 2012-06-26 NOTE — Progress Notes (Signed)
Patient ambulated 50 feet with rolling walker and 2liters 02.  Member only complaint of SOB no chest pain.  Patient tolerated well.  Will continue to monitor

## 2012-06-27 LAB — BASIC METABOLIC PANEL
CO2: 35 mEq/L — ABNORMAL HIGH (ref 19–32)
Calcium: 9.2 mg/dL (ref 8.4–10.5)
Creatinine, Ser: 0.74 mg/dL (ref 0.50–1.10)
GFR calc non Af Amer: 86 mL/min — ABNORMAL LOW (ref >90)
Glucose, Bld: 79 mg/dL (ref 70–99)

## 2012-06-27 MED ORDER — TRAMADOL HCL 50 MG PO TABS
50.0000 mg | ORAL_TABLET | Freq: Four times a day (QID) | ORAL | Status: DC | PRN
  Administered 2012-06-27 (×2): 50 mg via ORAL
  Filled 2012-06-27: qty 2
  Filled 2012-06-27 (×2): qty 1

## 2012-06-27 NOTE — Evaluation (Signed)
Occupational Therapy Evaluation Patient Details Name: Aimee Sanders MRN: [id number] DOB: 26-May-1945 Today's Date: 06/27/2012 Time: 1478-2956 OT Time Calculation (min): 17 min  OT Assessment / Plan / Recommendation Clinical Impression  Pt is a 67 yo female admitted for SOB and CHF/unstable angina with deficits in areas below.  Pt would benefit from cont OT to increase I with adls and efficiency with adls so she can d/c home with her family.      OT Assessment  Patient needs continued OT Services    Follow Up Recommendations  No OT follow up    Barriers to Discharge None    Equipment Recommendations  None recommended by OT    Recommendations for Other Services    Frequency  Min 2X/week    Precautions / Restrictions Precautions Precautions: None Precaution Comments: Uses O2 at home at 2.5 l/min. Restrictions Weight Bearing Restrictions: No   Pertinent Vitals/Pain Pt with no c/o pain.  Pt very SOB during activity.  Energy conservation techniques reviewed.    ADL  Eating/Feeding: Performed;Independent Where Assessed - Eating/Feeding: Chair Grooming: Performed;Wash/dry hands;Supervision/safety Where Assessed - Grooming: Supported standing Upper Body Bathing: Performed;Set up Where Assessed - Upper Body Bathing: Unsupported sitting Lower Body Bathing: Simulated;Minimal assistance Where Assessed - Lower Body Bathing: Supported sit to stand Upper Body Dressing: Performed;Set up Where Assessed - Upper Body Dressing: Unsupported sitting Lower Body Dressing: Performed;Minimal assistance Where Assessed - Lower Body Dressing: Supported sit to stand Toilet Transfer: Research scientist (life sciences) Method: Sit to stand;Stand pivot Acupuncturist: Comfort height toilet;Grab bars Toileting - Architect and Hygiene: Performed;Supervision/safety Where Assessed - Engineer, mining and Hygiene: Standing Equipment Used: Rolling  walker Transfers/Ambulation Related to ADLs: cues to reach back and let go of walker when sitting. ADL Comments: Pt does well.  Daughter assists with R sock and shoe due to old surgery.    OT Diagnosis: Generalized weakness  OT Problem List: Decreased activity tolerance;Decreased safety awareness OT Treatment Interventions: Self-care/ADL training;Energy conservation   OT Goals Acute Rehab OT Goals OT Goal Formulation: With patient Time For Goal Achievement: 07/04/12 Potential to Achieve Goals: Good ADL Goals Pt Will Perform Grooming: with modified independence;Standing at sink;Other (comment) (using proper energy conservation techniques.) ADL Goal: Grooming - Progress: Goal set today Additional ADL Goal #1: Pt will complete all toileting with 3:1 over commode with S using proper hand placement when sitting. ADL Goal: Additional Goal #1 - Progress: Goal set today Miscellaneous OT Goals Miscellaneous OT Goal #1: Pt will state three things she can do at home to conserve energy during adls. OT Goal: Miscellaneous Goal #1 - Progress: Goal set today  Visit Information  Last OT Received On: 06/27/12 Assistance Needed: +1    Subjective Data  Subjective: "I am feeling ok today." Patient Stated Goal: to get home and not be SOB   Prior Functioning     Home Living Lives With: Spouse;Daughter Available Help at Discharge: Family;Available 24 hours/day Type of Home: Mobile home Home Access: Stairs to enter Entrance Stairs-Number of Steps: 4 Entrance Stairs-Rails: Right;Left Home Layout: One level Bathroom Shower/Tub: Engineer, manufacturing systems: Standard Bathroom Accessibility: Yes How Accessible: Accessible via walker Home Adaptive Equipment: Wheelchair - manual;Walker - rolling;Straight cane;Shower chair with back;Bedside commode/3-in-1 Prior Function Level of Independence: Independent with assistive device(s);Needs assistance Needs Assistance: Meal Prep;Light  Housekeeping Meal Prep: Maximal Light Housekeeping: Total Able to Take Stairs?: Yes Driving: No Vocation: Retired Comments: daughter helps her get into tub and then she bathes. Communication  Communication: No difficulties Dominant Hand: Right         Vision/Perception Vision - History Baseline Vision: Wears glasses all the time Visual History: Other (comment) (Nystagmus) Patient Visual Report: No change from baseline Vision - Assessment Vision Assessment: Vision not tested   Cognition  Cognition Overall Cognitive Status: Appears within functional limits for tasks assessed/performed Arousal/Alertness: Awake/alert Orientation Level: Oriented X4 / Intact Behavior During Session: WFL for tasks performed Cognition - Other Comments: intact    Extremity/Trunk Assessment Right Upper Extremity Assessment RUE ROM/Strength/Tone: WFL for tasks assessed RUE Sensation: WFL - Light Touch RUE Coordination: WFL - gross/fine motor Left Upper Extremity Assessment LUE ROM/Strength/Tone: WFL for tasks assessed LUE Sensation: WFL - Light Touch LUE Coordination: WFL - gross/fine motor Trunk Assessment Trunk Assessment: Normal     Mobility Bed Mobility Bed Mobility: Supine to Sit;Sitting - Scoot to Edge of Bed;Sit to Supine Supine to Sit: 7: Independent;HOB elevated Sitting - Scoot to Edge of Bed: 7: Independent Sit to Supine: 7: Independent;HOB elevated Details for Bed Mobility Assistance: No cues or assist needed. Transfers Transfers: Sit to Stand;Stand to Sit Sit to Stand: 5: Supervision;With armrests;From bed Stand to Sit: 5: Supervision;To chair/3-in-1;With armrests Details for Transfer Assistance: Verbal cues for safe hand placement.  Assist for safety.     Exercise     Balance     End of Session OT - End of Session Activity Tolerance: Patient limited by fatigue;Other (comment) (pt easily SOB) Patient left: in bed;with call bell/phone within reach Nurse Communication:  Mobility status  GO     Hope Budds 06/27/2012, 9:31 AM [phone]

## 2012-06-27 NOTE — Progress Notes (Signed)
Subjective:  The patient continues to complain of some left-sided chest discomfort which is constant.  Her cardiac enzymes are negative.  Previous EKGs have shown lateral T wave inversion which have not evolved.  Will repeat EKG.  The patient is feeling stronger. Able to walk in hall yesterday.  Objective:  Vital Signs in the last 24 hours: Temp:  [98 F (36.7 C)-99.1 F (37.3 C)] 98.5 F (36.9 C) (03/10 0609) Pulse Rate:  [65-77] 65 (03/10 0609) Resp:  [18] 18 (03/10 0609) BP: (101-108)/(46-64) 107/63 mmHg (03/10 0609) SpO2:  [92 %-97 %] 95 % (03/10 0807) Weight:  [124 lb 6.4 oz (56.427 kg)] 124 lb 6.4 oz (56.427 kg) (03/10 0239)  Intake/Output from previous day: 03/09 0701 - 03/10 0700 In: 320 [P.O.:320] Out: 1100 [Urine:1100] Intake/Output from this shift:    . aspirin EC  81 mg Oral Daily  . atorvastatin  40 mg Oral q1800  . Chlorhexidine Gluconate Cloth  6 each Topical Q0600  . citalopram  20 mg Oral Daily  . fenofibrate  160 mg Oral Daily  . furosemide  40 mg Oral Daily  . guaiFENesin  1,200 mg Oral BID  . isosorbide mononitrate  30 mg Oral Daily  . levofloxacin  500 mg Oral Q1200  . LORazepam  0.5 mg Oral QID  . metoprolol tartrate  12.5 mg Oral BID  . mometasone-formoterol  2 puff Inhalation BID  . montelukast  10 mg Oral QHS  . mupirocin ointment  1 application Nasal BID  . pantoprazole  40 mg Oral Daily  . potassium chloride  40 mEq Oral BID  . rivaroxaban  20 mg Oral Q supper  . sodium chloride  3 mL Intravenous Q12H  . tiotropium  18 mcg Inhalation Daily      Physical Exam: General: Chronically ill app F in no acute distress.  Head: Normocephalic, atraumatic, sclera non-icteric, no xanthomas, nares are without discharge.  Neck: Negative for carotid bruits. JVD not elevated.  Lungs: Breathing unlabored. Diminished BS throughout with diffuse exp wheezing.  Heart: RRR S1 S2 without murmurs, rubs, or gallops.  Abdomen: Soft, non-tender, non-distended with  normoactive bowel sounds. No hepatomegaly. No rebound/guarding. No obvious abdominal masses.  Msk: Strength and tone appear normal for age.  Extremities: No clubbing or cyanosis. No edema. Distal pedal pulses are 2+ and equal bilaterally.  Neuro: Alert and oriented X 3. Moves all extremities spontaneously.  Psych: Responds to questions appropriately with a normal affect.    Lab Results:  Recent Labs  06/25/12 0640 06/26/12 0445  WBC 6.3 6.6  HGB 10.5* 10.5*  PLT 194 214    Recent Labs  06/25/12 0640 06/26/12 0445  NA 140 137  K 3.0* 3.0*  CL 96 93*  CO2 37* 36*  GLUCOSE 83 83  BUN <3* 3*  CREATININE 0.70 0.76   No results found for this basename: TROPONINI, CK, MB,  in the last 72 hours Hepatic Function Panel No results found for this basename: PROT, ALBUMIN, AST, ALT, ALKPHOS, BILITOT, BILIDIR, IBILI,  in the last 72 hours No results found for this basename: CHOL,  in the last 72 hours No results found for this basename: PROTIME,  in the last 72 hours  Imaging: Imaging results have been reviewed  Cardiac Studies: Telemetry shows normal sinus rhythm.  Occasional PVCs. Assessment/Plan:  1. CAD/Unstable angina with new EKG changes, not evolving.  We'll recheck EKG today. Continue medical therapy.  She is not a good candidate for invasive  study. 2. Chronic combined CHF .  She appears to be euvolemic now.  BUN and creatinine remain low.  We'll decrease Lasix to once a day.  Blood pressure remains soft 3. Chronic respiratory failure 2/2 O2-dependent COPD, OSA and # 2  4. Cough/bronchitis  5. H/o RLE DVT, on Xarelto at home  6. Ongoing tobacco abuse  7. H/o CVA  8. Hypokalemia  9. Normocytic anemia  Discussion: Myoview study was technically difficult.  We will try to treat medically.  She does not have any evidence of myocardial damage by enzymes.  Today we will try to increase activity.  Physical therapy working with her. She is looking forward to possible discharge  home tomorrow.  Aimee Sanders 06/27/2012, 8:23 AM

## 2012-06-27 NOTE — Progress Notes (Signed)
[facility] PROGRESS NOTE  Aimee Sanders WJX:[id number] DOB: 1945-07-03 DOA: 06/22/2012 PCP: Bennie Pierini, FNP  Assessment/Plan: Unstable angina with EKG changes, known CAD Cardiac enzymes negative thus far. Although pain reported to be chronic and reproducable on palpation concerning for musculoskeletal pain. Resumed Xarelto, heparin drip discontinued. Continue aspirin, tolerated dose on 06/23/2012 though she has an allergy to aspirin. Plavix discontinued by cardiology. Cardiology, following the patient, stress test performed on 06/24/2012 concern for possible septal ischemia, transfer to telemetry, cardiology deciding on patient's management. Continue Imdur, statin, and metoprolol. Will mobilize with PT today and see how she does.  Cough/Bronchitis Continue levofloxacin for a total of 5 days, started on 06/23/2012. Complicated by tobacco use. Counseled on cessation.  Left-sided abdominal pain? Exam benign. CT abdomen and pelvis unremarkable. Suspect referred pain. May benefit from outpatient GI evaluation.   History of systolic CHF, appears compensated 11/2011 ejection fraction of 40-45%, inferolateral hypokinesis. Continue Lasix.  Oxygen-dependent COPD/Chronic respiratory failure Stable. Continue Advair, Spiriva, nebulizer treatments, Singulair.  History of DVT Resumed Xarelto.  History of stroke Stable  OSA Cannot tolerate CPAP.  Ongoing cigarette use Smoking cessation counseled.  Hypokalemia Replace as needed.  Code Status: Full code  Family Communication: Family not present.  Disposition Plan: Pending, consider possible discharge tomorrow.  Consultants:  Cardiology  Procedures:  As above.  Antibiotics:  Levofloxacin 06/23/2012 >>  HPI/Subjective: Complaining of chest heaviness. Breathing better.  Objective: Filed Vitals:   06/26/12 2128 06/27/12 0239 06/27/12 0609 06/27/12 0807  BP: 108/60  107/63   Pulse: 76  65   Temp: 98 F (36.7 C)  98.5 F  (36.9 C)   TempSrc: Oral  Oral   Resp: 18  18   Height:      Weight:  56.427 kg (124 lb 6.4 oz)    SpO2: 97%  96% 95%    Intake/Output Summary (Last 24 hours) at 06/27/12 0824 Last data filed at 06/27/12 0420  Gross per 24 hour  Intake    320 ml  Output   1100 ml  Net   -780 ml   Filed Weights   06/25/12 0400 06/26/12 0428 06/27/12 0239  Weight: 57.4 kg (126 lb 8.7 oz) 57.471 kg (126 lb 11.2 oz) 56.427 kg (124 lb 6.4 oz)    Exam: Physical Exam: General: Awake, Oriented, No acute distress. HEENT: EOMI. Neck: Supple CV: S1 and S2 Lungs: Good air movement, scattered breath sounds improved from admission. Abdomen: Soft, Nontender, Nondistended, +bowel sounds. Ext: Good pulses. Trace edema.  Data Reviewed: Basic Metabolic Panel:  Recent Labs Lab 06/22/12 2005 06/24/12 0730 06/25/12 0640 06/26/12 0445  NA 143 142 140 137  K 3.5 3.2* 3.0* 3.0*  CL 102 96 96 93*  CO2 34* 38* 37* 36*  GLUCOSE 92 80 83 83  BUN 7 4* <3* 3*  CREATININE 0.57 0.58 0.70 0.76  CALCIUM 8.3* 8.8 8.9 8.4  MG  --   --  1.5  --    Liver Function Tests:  Recent Labs Lab 06/22/12 2005  AST 28  ALT 12  ALKPHOS 86  BILITOT 0.2*  PROT 5.4*  ALBUMIN 2.2*    Recent Labs Lab 06/22/12 2005  LIPASE 25   No results found for this basename: AMMONIA,  in the last 168 hours CBC:  Recent Labs Lab 06/22/12 2005 06/24/12 0730 06/25/12 0640 06/26/12 0445  WBC 7.9 7.7 6.3 6.6  NEUTROABS 3.4  --   --   --   HGB 11.4* 10.8* 10.5*  10.5*  HCT 34.6* 32.7* 30.8* 31.8*  MCV 91.8 92.1 90.6 91.9  PLT 211 189 194 214   Cardiac Enzymes:  Recent Labs Lab 06/22/12 2005 06/22/12 2318 06/23/12 0630 06/23/12 1100 06/23/12 1757  TROPONINI <0.30 <0.30 <0.30 <0.30 <0.30   BNP (last 3 results)  Recent Labs  01/01/12 1612 02/08/12 1121 06/22/12 2005  PROBNP 2063.0* 1958.0* 3562.0*   CBG: No results found for this basename: GLUCAP,  in the last 168 hours  Recent Results (from the past 240  hour(s))  MRSA PCR SCREENING     Status: Abnormal   Collection Time    06/23/12  6:34 AM      Result Value Range Status   MRSA by PCR POSITIVE (*) NEGATIVE Final   Comment:            The GeneXpert MRSA Assay (FDA     approved for NASAL specimens     only), is one component of a     comprehensive MRSA colonization     surveillance program. It is not     intended to diagnose MRSA     infection nor to guide or     monitor treatment for     MRSA infections.     RESULT CALLED TO, READ BACK BY AND VERIFIED WITH:     C HAGERMAN, RN 06/23/12 0830 BY KSCHULTZ     Studies: No results found.  Scheduled Meds: . aspirin EC  81 mg Oral Daily  . atorvastatin  40 mg Oral q1800  . Chlorhexidine Gluconate Cloth  6 each Topical Q0600  . citalopram  20 mg Oral Daily  . fenofibrate  160 mg Oral Daily  . furosemide  40 mg Oral Daily  . guaiFENesin  1,200 mg Oral BID  . isosorbide mononitrate  30 mg Oral Daily  . levofloxacin  500 mg Oral Q1200  . LORazepam  0.5 mg Oral QID  . metoprolol tartrate  12.5 mg Oral BID  . mometasone-formoterol  2 puff Inhalation BID  . montelukast  10 mg Oral QHS  . mupirocin ointment  1 application Nasal BID  . pantoprazole  40 mg Oral Daily  . potassium chloride  40 mEq Oral BID  . rivaroxaban  20 mg Oral Q supper  . sodium chloride  3 mL Intravenous Q12H  . tiotropium  18 mcg Inhalation Daily   Continuous Infusions:    Principal Problem:   Unstable angina Active Problems:   HYPERLIPIDEMIA   SMOKER   DEEP VEIN THROMBOSIS/PHLEBITIS   RESPIRATORY FAILURE, CHRONIC   SLEEP APNEA   COPD (chronic obstructive pulmonary disease)   Chronic combined systolic and diastolic CHF (congestive heart failure)   Coronary artery disease    REDDY,SRIKAR A, MD  [facility] Pager [phone]. If 7PM-7AM, please contact night-coverage at [email or web address], password [facility] 06/27/2012, 8:24 AM  LOS: 5 days

## 2012-06-27 NOTE — Care Management Note (Signed)
    Page 1 of 2   06/28/2012     9:56:47 AM   CARE MANAGEMENT NOTE 06/28/2012  Patient:  MORGEN, LINEBAUGH   Account Number:  [id number]  Date Initiated:  06/27/2012  Documentation initiated by:  AMERSON,JULIE  Subjective/Objective Assessment:   PT ADM WITH [place], BRONCHITIS, COPD ON 06/22/12.  PTA, PT LIVES AT HOME WITH DAUGHTER.     Action/Plan:   MET WITH PT TO DISCUSS DC PLANS.  PT STATES SHE HAS USED [facility] IN THE PAST, AND WISHES TO USE AGAIN.  SHE HAS RW, OXYGEN AND BSC AT HOME.  PT/OT RECOMMENDING HH FOLLOW UP.   Anticipated DC Date:  06/29/2012   Anticipated DC Plan:  HOME W HOME HEALTH SERVICES      DC Planning Services  CM consult      Choice offered to / List presented to:          [facility] arranged  HH-1 RN  HH-2 PT      [facility] agency  [facility]   Status of service:  Completed, signed off Medicare Important Message given?   (If response is "NO", the following Medicare IM given date fields will be blank) Date Medicare IM given:   Date Additional Medicare IM given:    Discharge Disposition:  HOME W HOME HEALTH SERVICES  Per UR Regulation:  Reviewed for med. necessity/level of care/duration of stay  If discussed at Long Length of Stay Meetings, dates discussed:    Comments:  06-28-12 9:55am Avie Arenas, RNBSN [phone] Orders recieved for [facility] RN and PT - Referral made to [facility].  06/27/12 JULIE AMERSON,RN,BSN [phone] MD/PA:  PT NEEDS HH FOLLOW UP AT DC:  PLEASE ORDER HHRN, PT, AND OT IF YOU AGREE.  THANKS!

## 2012-06-27 NOTE — Progress Notes (Signed)
Pt c/o 10/10 CP, no SOB, VSS, pt resting in chair and talking on the phone. 1 SL nitro given, pt's v/s following stable, BP: 90/60 (manual), CP beginning to resolve, pt states 6/10 and "getting better". PA notified, tramadol ordered for pain. Will continue to monitor closely.

## 2012-06-27 NOTE — Progress Notes (Signed)
Physical Therapy Treatment Patient Details Name: Aimee Sanders MRN: [id number] DOB: 12/07/1945 Today's Date: 06/27/2012 Time: 1140-1206 PT Time Calculation (min): 26 min  PT Assessment / Plan / Recommendation Comments on Treatment Session  Pt s/p CP and SOB.  Pt progresssing with ambulation.  Still gets very dyspneic with activity.  C/o CP after ambulation.  Notified nursing who assessed pt. HHPT f/u for endurance training may be beneficial.    Follow Up Recommendations  Supervision/Assistance - 24 hour;Home health PT                 Equipment Recommendations  None recommended by PT        Frequency Min 3X/week   Plan Frequency remains appropriate;Discharge plan needs to be updated    Precautions / Restrictions Precautions Precautions: None Precaution Comments: Uses O2 at home at 2.5 l/min. Restrictions Weight Bearing Restrictions: No   Pertinent Vitals/Pain VSS, Chest pain after ambulation with nursing notified.    Mobility  Bed Mobility Bed Mobility: Supine to Sit;Sitting - Scoot to Edge of Bed;Sit to Supine Supine to Sit: 7: Independent;HOB elevated Sitting - Scoot to Edge of Bed: 7: Independent Sit to Supine: 7: Independent;HOB elevated Transfers Transfers: Sit to Stand;Stand to Sit Sit to Stand: 5: Supervision;With upper extremity assist;From bed Stand to Sit: 5: Supervision;To chair/3-in-1;With armrests Details for Transfer Assistance: Safe technique Ambulation/Gait Ambulation/Gait Assistance: 4: Min guard Ambulation Distance (Feet): 150 Feet Assistive device: Rolling walker Ambulation/Gait Assistance Details: Verbal cues to slow down to minimize dyspnea.  Pt needed cues to stay close to RW and to stand upright.  DOE 3/4.  Pt c/o chest pain on return to room as well.  Notified nursing who came in and assessed pt.  See nursing note.   Gait Pattern: Step-through pattern;Decreased stride length Gait velocity: Encouraged patient to slow to safe pace that would  minimize dyspnea with gait. Stairs: No Wheelchair Mobility Wheelchair Mobility: No    PT Goals Acute Rehab PT Goals PT Goal: Sit to Stand - Progress: Progressing toward goal PT Goal: Ambulate - Progress: Progressing toward goal  Visit Information  Last PT Received On: 06/27/12 Assistance Needed: +1    Subjective Data  Subjective: "My chest is hurting," upon returning from walk.   Cognition  Cognition Overall Cognitive Status: Appears within functional limits for tasks assessed/performed Arousal/Alertness: Awake/alert Orientation Level: Oriented X4 / Intact Behavior During Session: [facility] for tasks performed    Balance  Static Sitting Balance Static Sitting - Balance Support: No upper extremity supported;Feet supported Static Sitting - Level of Assistance: 7: Independent Static Sitting - Comment/# of Minutes: 8 minutes at EOB without assist  End of Session PT - End of Session Equipment Utilized During Treatment: Gait belt;Oxygen Activity Tolerance: Patient limited by fatigue;Patient limited by pain Patient left: in chair;with call bell/phone within reach;with nursing in room Nurse Communication: Mobility status;Other (comment) (pt with chest pain)       INGOLD,DAWN 06/27/2012, 3:44 PM [facility] Acute Rehabilitation [phone] (pager)

## 2012-06-27 NOTE — Progress Notes (Signed)
Pt requested breathing tx, resp notified.

## 2012-06-28 LAB — BASIC METABOLIC PANEL
BUN: 6 mg/dL (ref 6–23)
Calcium: 8.7 mg/dL (ref 8.4–10.5)
GFR calc non Af Amer: 85 mL/min — ABNORMAL LOW (ref >90)
Glucose, Bld: 95 mg/dL (ref 70–99)

## 2012-06-28 MED ORDER — GUAIFENESIN ER 600 MG PO TB12: 600.0000 mg | ORAL_TABLET | Freq: Two times a day (BID) | ORAL | Status: DC

## 2012-06-28 MED ORDER — ASPIRIN 81 MG PO TBEC: 81.0000 mg | DELAYED_RELEASE_TABLET | Freq: Every day | ORAL | Status: DC

## 2012-06-28 MED ORDER — ISOSORBIDE MONONITRATE ER 30 MG PO TB24: 30.0000 mg | ORAL_TABLET | Freq: Every day | ORAL | Status: DC

## 2012-06-28 NOTE — Progress Notes (Signed)
[facility] PROGRESS NOTE  Aimee Sanders ZOX:[id number] DOB: 1945-12-21 DOA: 06/22/2012 PCP: Bennie Pierini, FNP  Assessment/Plan: Unstable angina with EKG changes, known CAD Cardiac enzymes negative thus far. Although pain reported to be chronic and reproducable on palpation concerning for musculoskeletal pain. Resumed Xarelto, heparin drip discontinued. Continue aspirin, tolerated dose on 06/23/2012 though she has an allergy to aspirin. Cardiology, following the patient, stress test performed on 06/24/2012 concern for possible septal ischemia, cardiology recommended conservative management. Continue Imdur, statin, and metoprolol. Worked with PT, will arrange for home health PT/RN at discharge.  Cough/Bronchitis Complete 5 day course of levofloxacin, started on 06/23/2012. Complicated by tobacco use. Counseled on cessation.  Left-sided abdominal pain? Exam benign. CT abdomen and pelvis unremarkable. Suspect referred pain. May benefit from outpatient GI evaluation.   History of systolic CHF, appears compensated 11/2011 ejection fraction of 40-45%, inferolateral hypokinesis. Continue Lasix.  Oxygen-dependent COPD/Chronic respiratory failure Stable. Continue Advair, Spiriva, nebulizer treatments, Singulair.  History of DVT Resumed Xarelto.  History of stroke Stable  OSA Cannot tolerate CPAP.  Ongoing cigarette use Smoking cessation counseled.  Hypokalemia Replace as needed.  Code Status: Full code  Family Communication: Family not present.  Disposition Plan: Discharge home today.  Consultants:  Cardiology  Procedures:  As above.  Antibiotics:  Levofloxacin 06/23/2012 >>  HPI/Subjective: No specific concerns. Feels something in her stomach after she had breakfast.  Objective: Filed Vitals:   06/27/12 1253 06/27/12 2029 06/27/12 2144 06/28/12 0643  BP: 117/65  92/54 108/76  Pulse: 74  81 107  Temp: 97.9 F (36.6 C)  98.7 F (37.1 C) 98.2 F (36.8 C)   TempSrc: Oral  Oral Oral  Resp: 18  20 20   Height:      Weight:    57.063 kg (125 lb 12.8 oz)  SpO2: 97% 94% 95% 98%    Intake/Output Summary (Last 24 hours) at 06/28/12 0801 Last data filed at 06/28/12 0647  Gross per 24 hour  Intake    243 ml  Output    300 ml  Net    -57 ml   Filed Weights   06/26/12 0428 06/27/12 0239 06/28/12 0643  Weight: 57.471 kg (126 lb 11.2 oz) 56.427 kg (124 lb 6.4 oz) 57.063 kg (125 lb 12.8 oz)    Exam: Physical Exam: General: Awake, Oriented, No acute distress. HEENT: EOMI. Neck: Supple CV: S1 and S2 Lungs: Good air movement, scattered breath sounds improved from admission. Abdomen: Soft, Nontender, Nondistended, +bowel sounds. Ext: Good pulses. Trace edema.  Data Reviewed: Basic Metabolic Panel:  Recent Labs Lab 06/24/12 0730 06/25/12 0640 06/26/12 0445 06/27/12 0652 06/28/12 0540  NA 142 140 137 139 134*  K 3.2* 3.0* 3.0* 3.7 3.7  CL 96 96 93* 98 96  CO2 38* 37* 36* 35* 33*  GLUCOSE 80 83 83 79 95  BUN 4* <3* 3* 4* 6  CREATININE 0.58 0.70 0.76 0.74 0.77  CALCIUM 8.8 8.9 8.4 9.2 8.7  MG  --  1.5  --   --   --    Liver Function Tests:  Recent Labs Lab 06/22/12 2005  AST 28  ALT 12  ALKPHOS 86  BILITOT 0.2*  PROT 5.4*  ALBUMIN 2.2*    Recent Labs Lab 06/22/12 2005  LIPASE 25   No results found for this basename: AMMONIA,  in the last 168 hours CBC:  Recent Labs Lab 06/22/12 2005 06/24/12 0730 06/25/12 0640 06/26/12 0445  WBC 7.9 7.7 6.3 6.6  NEUTROABS 3.4  --   --   --  HGB 11.4* 10.8* 10.5* 10.5*  HCT 34.6* 32.7* 30.8* 31.8*  MCV 91.8 92.1 90.6 91.9  PLT 211 189 194 214   Cardiac Enzymes:  Recent Labs Lab 06/22/12 2005 06/22/12 2318 06/23/12 0630 06/23/12 1100 06/23/12 1757  TROPONINI <0.30 <0.30 <0.30 <0.30 <0.30   BNP (last 3 results)  Recent Labs  01/01/12 1612 02/08/12 1121 06/22/12 2005  PROBNP 2063.0* 1958.0* 3562.0*   CBG:  Recent Labs Lab 06/28/12 0720  GLUCAP 84     Recent Results (from the past 240 hour(s))  MRSA PCR SCREENING     Status: Abnormal   Collection Time    06/23/12  6:34 AM      Result Value Range Status   MRSA by PCR POSITIVE (*) NEGATIVE Final   Comment:            The GeneXpert MRSA Assay (FDA     approved for NASAL specimens     only), is one component of a     comprehensive MRSA colonization     surveillance program. It is not     intended to diagnose MRSA     infection nor to guide or     monitor treatment for     MRSA infections.     RESULT CALLED TO, READ BACK BY AND VERIFIED WITH:     C HAGERMAN, RN 06/23/12 0830 BY KSCHULTZ     Studies: No results found.  Scheduled Meds: . aspirin EC  81 mg Oral Daily  . atorvastatin  40 mg Oral q1800  . citalopram  20 mg Oral Daily  . fenofibrate  160 mg Oral Daily  . furosemide  40 mg Oral Daily  . guaiFENesin  1,200 mg Oral BID  . isosorbide mononitrate  30 mg Oral Daily  . LORazepam  0.5 mg Oral QID  . metoprolol tartrate  12.5 mg Oral BID  . mometasone-formoterol  2 puff Inhalation BID  . montelukast  10 mg Oral QHS  . pantoprazole  40 mg Oral Daily  . potassium chloride  40 mEq Oral BID  . rivaroxaban  20 mg Oral Q supper  . sodium chloride  3 mL Intravenous Q12H  . tiotropium  18 mcg Inhalation Daily   Continuous Infusions:    Principal Problem:   Unstable angina Active Problems:   HYPERLIPIDEMIA   SMOKER   DEEP VEIN THROMBOSIS/PHLEBITIS   RESPIRATORY FAILURE, CHRONIC   SLEEP APNEA   COPD (chronic obstructive pulmonary disease)   Chronic combined systolic and diastolic CHF (congestive heart failure)   Coronary artery disease    REDDY,SRIKAR A, MD  [facility] Pager [phone]. If 7PM-7AM, please contact night-coverage at [email or web address], password [facility] 06/28/2012, 8:01 AM  LOS: 6 days

## 2012-06-28 NOTE — Discharge Summary (Signed)
Physician Discharge Summary  Aimee Sanders YNW:[id number] DOB: [birth year] DOA: 06/22/2012  PCP: Bennie Pierini, FNP  Admit date: 06/22/2012 Discharge date: 06/28/2012  Time spent: 35 minutes  Recommendations for Outpatient Follow-up:  Please followup with Bennie Pierini, FNP (PCP) in 1 week. Please have CBC and BMET checked at the next clinic visit.  Please followup with Dr. Diona Browner (Cardiology) in 1 week.  Home health PT/RN arranged at discharge.  Discharge Diagnoses:  Principal Problem:   Unstable angina Active Problems:   HYPERLIPIDEMIA   SMOKER   DEEP VEIN THROMBOSIS/PHLEBITIS   RESPIRATORY FAILURE, CHRONIC   SLEEP APNEA   COPD (chronic obstructive pulmonary disease)   Chronic combined systolic and diastolic CHF (congestive heart failure)   Coronary artery disease   Discharge Condition: Stable  Diet recommendation: Heart healthy diet  Filed Weights   06/26/12 0428 06/27/12 0239 06/28/12 0643  Weight: 57.471 kg (126 lb 11.2 oz) 56.427 kg (124 lb 6.4 oz) 57.063 kg (125 lb 12.8 oz)    History of present illness:  On admission: "67 year old woman PMH including CAD presents the emergency department with multiple complaints including left-sided chest pain and shortness of breath. Initial troponin was negative but EKG demonstrated diffuse T-wave inversion concerning for ischemia and patient was treated for unstable angina, cardiology consulted and recommendation to transfer to [facility] for further evaluation."  Hospital Course:  Unstable angina with EKG changes, known CAD Cardiac enzymes negative. Although pain reported to be chronic and reproducable on palpation concerning for musculoskeletal pain. Resumed home Xarelto after heparin drip discontined. Continue aspirin, tolerated dose on 06/23/2012 though she has an allergy to aspirin. Cardiology, following the patient, stress test performed on 06/24/2012 concern for possible septal ischemia, cardiology recommended  conservative management. Continue Imdur, statin, and metoprolol. Worked with PT, will arrange for home health PT/RN at discharge.  Cough/Bronchitis Complete 5 day course of levofloxacin, started on 06/23/2012. Complicated by tobacco use. Counseled on cessation.  Left-sided abdominal pain? Exam benign. CT abdomen and pelvis unremarkable. Suspect referred pain. May benefit from outpatient GI evaluation.   History of systolic CHF, appears compensated 11/2011 ejection fraction of 40-45%, inferolateral hypokinesis. Continue Lasix.  Oxygen-dependent COPD/Chronic respiratory failure Stable. Continue Advair, Spiriva, nebulizer treatments, Singulair.  History of DVT Resumed Xarelto.  History of stroke Stable  OSA Cannot tolerate CPAP.  Ongoing cigarette use Smoking cessation counseled.  Hypokalemia Replace as needed.  Consultants:  Cardiology  Procedures:  As above.  Antibiotics:  Levofloxacin 06/23/2012 >> 06/27/2012  Discharge Exam: Filed Vitals:   06/27/12 1253 06/27/12 2029 06/27/12 2144 06/28/12 0643  BP: 117/65  92/54 108/76  Pulse: 74  81 107  Temp: 97.9 F (36.6 C)  98.7 F (37.1 C) 98.2 F (36.8 C)  TempSrc: Oral  Oral Oral  Resp: 18  20 20   Height:      Weight:    57.063 kg (125 lb 12.8 oz)  SpO2: 97% 94% 95% 98%   Discharge Instructions  Discharge Orders   Future Orders Complete By Expires     Diet - low sodium heart healthy  As directed     Discharge instructions  As directed     Comments:      Please followup with Bennie Pierini, FNP (PCP) in 1 week. Please have CBC and BMET checked at the next clinic visit.  Please followup with Dr. Diona Browner (Cardiology) in 1 week.    Increase activity slowly  As directed         Medication List  STOP taking these medications       levofloxacin 750 MG tablet  Commonly known as:  LEVAQUIN      TAKE these medications       albuterol 0.63 MG/3ML nebulizer solution  Commonly known as:  ACCUNEB  Take  1 ampule by nebulization every 6 (six) hours as needed. For wheezing     aspirin 81 MG EC tablet  Take 1 tablet (81 mg total) by mouth daily.     atorvastatin 40 MG tablet  Commonly known as:  LIPITOR  Take 40 mg by mouth daily.     citalopram 20 MG tablet  Commonly known as:  CELEXA  Take 20 mg by mouth daily.     clonazePAM 0.5 MG tablet  Commonly known as:  KLONOPIN  Take 1 mg by mouth 3 (three) times daily as needed. For nervousness     fenofibrate 160 MG tablet  Take 160 mg by mouth daily.     Fluticasone-Salmeterol 250-50 MCG/DOSE Aepb  Commonly known as:  ADVAIR  Inhale 1 puff into the lungs every 12 (twelve) hours.     folic acid 1 MG tablet  Commonly known as:  FOLVITE  Take 1 mg by mouth daily.     furosemide 80 MG tablet  Commonly known as:  LASIX  Take 40 mg by mouth 2 (two) times daily.     guaiFENesin 600 MG 12 hr tablet  Commonly known as:  MUCINEX  Take 1 tablet (600 mg total) by mouth 2 (two) times daily.     ipratropium-albuterol 0.5-2.5 (3) MG/3ML Soln  Commonly known as:  DUONEB  Take 3 mLs by nebulization 3 (three) times daily. For shortness of breath or wheezing     isosorbide mononitrate 30 MG 24 hr tablet  Commonly known as:  IMDUR  Take 1 tablet (30 mg total) by mouth daily.     metoprolol tartrate 25 MG tablet  Commonly known as:  LOPRESSOR  Take 25 mg by mouth 2 (two) times daily.     montelukast 10 MG tablet  Commonly known as:  SINGULAIR  Take 10 mg by mouth at bedtime.     nitroGLYCERIN 0.4 MG SL tablet  Commonly known as:  NITROSTAT  Place 0.4 mg under the tongue every 5 (five) minutes as needed. For chest pain     omeprazole 20 MG capsule  Commonly known as:  PRILOSEC  Take 20 mg by mouth daily.     ondansetron 4 MG tablet  Commonly known as:  ZOFRAN  Take 1 tablet (4 mg total) by mouth every 6 (six) hours.     oxyCODONE-acetaminophen 10-325 MG per tablet  Commonly known as:  PERCOCET  Take 1 tablet by mouth every 6  (six) hours as needed for pain. For pain     potassium chloride 10 MEQ tablet  Commonly known as:  K-DUR,KLOR-CON  Take 1 tablet (10 mEq total) by mouth daily.     promethazine 25 MG tablet  Commonly known as:  PHENERGAN  Take 0.5-1 tablets (12.5-25 mg total) by mouth every 6 (six) hours as needed for nausea. For nausea     Rivaroxaban 20 MG Tabs  Commonly known as:  XARELTO  Take 20 mg by mouth daily.     tiotropium 18 MCG inhalation capsule  Commonly known as:  SPIRIVA  Place 18 mcg into inhaler and inhale daily.           Follow-up Information   Follow up with  Bennie Pierini, FNP. Schedule an appointment as soon as possible for a visit in 1 week. (CBC and BMET at the next clinic visit.)    Contact information:   [address] [place] [address] [phone]       Follow up with Nona Dell, MD. Schedule an appointment as soon as possible for a visit in 1 week.   Contact information:   [address] [place] [address] [phone]        The results of significant diagnostics from this hospitalization (including imaging, microbiology, ancillary and laboratory) are listed below for reference.    Significant Diagnostic Studies: Dg Chest 2 View  06/22/2012  *RADIOLOGY REPORT*  Clinical Data: Shortness of breath, chest pain  CHEST - 2 VIEW  Comparison: 06/04/2012  Findings: Changes of vertebral augmentation in the mid and lower thoracic spine.  Diffuse osteopenia.  Stable compression deformity near the thoracolumbar junction.  Lungs are hyperinflated, clear. Mild cardiomegaly.  No effusion. Vascular clips in the upper abdomen.  IMPRESSION:  1.  Stable cardiomegaly, hyperinflation, and postoperative changes.   Original Report Authenticated By: D. Andria Rhein, MD    Dg Ribs Unilateral W/chest Left  06/04/2012  *RADIOLOGY REPORT*  Clinical Data: Abdominal pain.  Emesis.  Left chest pain.  LEFT RIBS AND CHEST - 3+ VIEW  Comparison: 04/01/2012   Findings: Hyperaeration.  Mild cardiomegaly.  Normal vascularity. Stable left basilar atelectasis or scar.  Old left rib fractures with callus formation are noted.  No definite acute rib fractures. The patient is status post vertebroplasty at multiple thoracic levels. There is a cortical step off involving a mid right rib laterally.  Age of this fracture is indeterminate.  IMPRESSION: Old left-sided rib fractures.  No definite acute left rib fracture.  Right rib fracture of indeterminate age.  Hyperaeration is likely related to COPD.  Left basilar scar.   Original Report Authenticated By: Jolaine Click, M.D.    Ct Abdomen Pelvis W Contrast  06/22/2012  *RADIOLOGY REPORT*  Clinical Data: Short of breath, muscle pain  CT ABDOMEN AND PELVIS WITH CONTRAST  Technique:  Multidetector CT imaging of the abdomen and pelvis was performed following the standard protocol during bolus administration of intravenous contrast.  Contrast: OMNIPAQUE IOHEXOL 300 MG/ML  SOLN  Comparison: CT 06/13/2010  Findings: Lung bases are clear.  Small pericardial effusion.  No focal hepatic lesion.  There is mild intrahepatic and extrahepatic biliary dilatation likely related to prior cholecystectomy.  The pancreas is atrophic without evidence of acute inflammation.  The spleen, adrenal glands, and kidneys are unremarkable.  Stomach, small bowel, and cecum are normal.  There is a moderate volume stool throughout the colon.  No retroperitoneal or periportal lymphadenopathy.  Abdominal aorta is normal caliber.  There is no free fluid the pelvis.  Post hysterectomy anatomy. There are diverticula sigmoid colon of acute inflammation.  There are compression fractures at T11, T12 and L1.  The thoracic levels have augmentation.  Findings are unchanged from prior.  IMPRESSION:  1.  No acute findings in the abdomen or pelvis. 2.  Sigmoid diverticulosis of diverticulitis. 3.  Mild intra and extrahepatic biliary duct dilatation likely related to  cholecystectomy. 4.  Essentially stable compression fracture of a lower thoracic and upper lumbar spine.   Original Report Authenticated By: Genevive Bi, M.D.     Microbiology: Recent Results (from the past 240 hour(s))  MRSA PCR SCREENING     Status: Abnormal   Collection Time  06/23/12  6:34 AM      Result Value Range Status   MRSA by PCR POSITIVE (*) NEGATIVE Final   Comment:            The GeneXpert MRSA Assay (FDA     approved for NASAL specimens     only), is one component of a     comprehensive MRSA colonization     surveillance program. It is not     intended to diagnose MRSA     infection nor to guide or     monitor treatment for     MRSA infections.     RESULT CALLED TO, READ BACK BY AND VERIFIED WITHWill Bonnet, RN 06/23/12 0830 BY KSCHULTZ     Labs: Basic Metabolic Panel:  Recent Labs Lab 06/24/12 0730 06/25/12 0640 06/26/12 0445 06/27/12 0652 06/28/12 0540  NA 142 140 137 139 134*  K 3.2* 3.0* 3.0* 3.7 3.7  CL 96 96 93* 98 96  CO2 38* 37* 36* 35* 33*  GLUCOSE 80 83 83 79 95  BUN 4* <3* 3* 4* 6  CREATININE 0.58 0.70 0.76 0.74 0.77  CALCIUM 8.8 8.9 8.4 9.2 8.7  MG  --  1.5  --   --   --    Liver Function Tests:  Recent Labs Lab 06/22/12 2005  AST 28  ALT 12  ALKPHOS 86  BILITOT 0.2*  PROT 5.4*  ALBUMIN 2.2*    Recent Labs Lab 06/22/12 2005  LIPASE 25   No results found for this basename: AMMONIA,  in the last 168 hours CBC:  Recent Labs Lab 06/22/12 2005 06/24/12 0730 06/25/12 0640 06/26/12 0445  WBC 7.9 7.7 6.3 6.6  NEUTROABS 3.4  --   --   --   HGB 11.4* 10.8* 10.5* 10.5*  HCT 34.6* 32.7* 30.8* 31.8*  MCV 91.8 92.1 90.6 91.9  PLT 211 189 194 214   Cardiac Enzymes:  Recent Labs Lab 06/22/12 2005 06/22/12 2318 06/23/12 0630 06/23/12 1100 06/23/12 1757  TROPONINI <0.30 <0.30 <0.30 <0.30 <0.30   BNP: BNP (last 3 results)  Recent Labs  01/01/12 1612 02/08/12 1121 06/22/12 2005  PROBNP 2063.0* 1958.0*  3562.0*   CBG:  Recent Labs Lab 06/28/12 0720  GLUCAP 84       Signed:  REDDY,SRIKAR A  [facility] 06/28/2012, 8:11 AM

## 2012-06-28 NOTE — Progress Notes (Signed)
Subjective:  The patient is overall doing well.  She did have some mild chest pain yesterday after walking a long way in the hall.  Pain was relieved by sublingual nitroglycerin.  The patient is familiar with nitroglycerin and has had it at home. Telemetry shows normal sinus rhythm with occasional PVCs and occasional sinus tachycardia with exercise.  Objective:  Vital Signs in the last 24 hours: Temp:  [97.9 F (36.6 C)-98.7 F (37.1 C)] 98.2 F (36.8 C) (03/11 0643) Pulse Rate:  [71-107] 107 (03/11 0643) Resp:  [18-20] 20 (03/11 0643) BP: (92-118)/(50-76) 108/76 mmHg (03/11 0643) SpO2:  [94 %-98 %] 98 % (03/11 0643) Weight:  [125 lb 12.8 oz (57.063 kg)] 125 lb 12.8 oz (57.063 kg) (03/11 0643)  Intake/Output from previous day: 03/10 0701 - 03/11 0700 In: 603 [P.O.:600; I.V.:3] Out: 300 [Urine:300] Intake/Output from this shift:    . aspirin EC  81 mg Oral Daily  . atorvastatin  40 mg Oral q1800  . citalopram  20 mg Oral Daily  . fenofibrate  160 mg Oral Daily  . furosemide  40 mg Oral Daily  . guaiFENesin  1,200 mg Oral BID  . isosorbide mononitrate  30 mg Oral Daily  . LORazepam  0.5 mg Oral QID  . metoprolol tartrate  12.5 mg Oral BID  . mometasone-formoterol  2 puff Inhalation BID  . montelukast  10 mg Oral QHS  . pantoprazole  40 mg Oral Daily  . potassium chloride  40 mEq Oral BID  . rivaroxaban  20 mg Oral Q supper  . sodium chloride  3 mL Intravenous Q12H  . tiotropium  18 mcg Inhalation Daily      Physical Exam: General: Chronically ill app F in no acute distress.  Head: Normocephalic, atraumatic, sclera non-icteric, no xanthomas, nares are without discharge.  Neck: Negative for carotid bruits. JVD not elevated.  Lungs: Breathing unlabored. Diminished BS throughout with slight wheezing.  No respiratory distress. Heart: RRR S1 S2 without murmurs, rubs, or gallops.  Abdomen: Soft, non-tender, non-distended with normoactive bowel sounds. No hepatomegaly. No  rebound/guarding. No obvious abdominal masses.  Msk: Strength and tone appear normal for age.  Extremities: No clubbing or cyanosis. No edema. Distal pedal pulses are 2+ and equal bilaterally.  Neuro: Alert and oriented X 3. Moves all extremities spontaneously.  Psych: Responds to questions appropriately with a normal affect.    Lab Results:  Recent Labs  06/26/12 0445  WBC 6.6  HGB 10.5*  PLT 214    Recent Labs  06/27/12 0652 06/28/12 0540  NA 139 134*  K 3.7 3.7  CL 98 96  CO2 35* 33*  GLUCOSE 79 95  BUN 4* 6  CREATININE 0.74 0.77   No results found for this basename: TROPONINI, CK, MB,  in the last 72 hours Hepatic Function Panel No results found for this basename: PROT, ALBUMIN, AST, ALT, ALKPHOS, BILITOT, BILIDIR, IBILI,  in the last 72 hours No results found for this basename: CHOL,  in the last 72 hours No results found for this basename: PROTIME,  in the last 72 hours  Imaging: Imaging results have been reviewed  Cardiac Studies: Telemetry shows normal sinus rhythm.  Occasional PVCs. Assessment/Plan:  1. CAD/Unstable angina with new EKG changes, not evolving.  We'll recheck EKG today. Continue medical therapy.  She is not a good candidate for invasive study. 2. Chronic combined CHF .  She appears to be euvolemic now.  BUN and creatinine remain low.  We'll decrease Lasix to once a day.  Blood pressure remains soft 3. Chronic respiratory failure 2/2 O2-dependent COPD, OSA and # 2  4. Cough/bronchitis  5. H/o RLE DVT, on Xarelto at home  6. Ongoing tobacco abuse  7. H/o CVA  8. Hypokalemia  9. Normocytic anemia  Discussion: The patient is stable from cardiac standpoint for discharge today.  She will followup in [facility] for cardiology with Dr. Diona Browner.  She may benefit from home health physical therapy to help improve endurance.  I spoke to her again today about the importance of total cessation of tobacco smoking.  Cassell Clement 06/28/2012, 7:31 AM

## 2012-07-05 ENCOUNTER — Other Ambulatory Visit: Payer: Self-pay | Admitting: *Deleted

## 2012-07-05 MED ORDER — FLUTICASONE-SALMETEROL 250-50 MCG/DOSE IN AEPB: 1.0000 | INHALATION_SPRAY | Freq: Two times a day (BID) | RESPIRATORY_TRACT | Status: DC

## 2012-07-06 ENCOUNTER — Telehealth: Payer: Self-pay | Admitting: *Deleted

## 2012-07-06 DIAGNOSIS — I509 Heart failure, unspecified: Secondary | ICD-10-CM

## 2012-07-06 MED ORDER — ISOSORBIDE MONONITRATE ER 30 MG PO TB24: 30.0000 mg | ORAL_TABLET | Freq: Every day | ORAL | Status: DC

## 2012-07-06 MED ORDER — FUROSEMIDE 80 MG PO TABS: 40.0000 mg | ORAL_TABLET | Freq: Two times a day (BID) | ORAL | Status: DC

## 2012-07-06 MED ORDER — TIOTROPIUM BROMIDE MONOHYDRATE 18 MCG IN CAPS: 18.0000 ug | ORAL_CAPSULE | Freq: Every day | RESPIRATORY_TRACT | Status: DC

## 2012-07-06 MED ORDER — MONTELUKAST SODIUM 10 MG PO TABS: 10.0000 mg | ORAL_TABLET | Freq: Every day | ORAL | Status: DC

## 2012-07-06 MED ORDER — CITALOPRAM HYDROBROMIDE 20 MG PO TABS: 20.0000 mg | ORAL_TABLET | Freq: Every day | ORAL | Status: DC

## 2012-07-06 MED ORDER — OMEPRAZOLE 20 MG PO CPDR: 20.0000 mg | DELAYED_RELEASE_CAPSULE | Freq: Every day | ORAL | Status: DC

## 2012-07-06 NOTE — Telephone Encounter (Signed)
I filled her lasix yesterday if i remember correctly

## 2012-07-06 NOTE — Telephone Encounter (Signed)
Lasix was not filled on 07/05/12, she needs a refill, on fax it says furosemide 20mg  and epic says 80mg . I looked back at her refills for yesterday and it was for advair only!

## 2012-07-07 ENCOUNTER — Other Ambulatory Visit: Payer: Self-pay | Admitting: *Deleted

## 2012-07-07 ENCOUNTER — Telehealth: Payer: Self-pay | Admitting: Family Medicine

## 2012-07-07 ENCOUNTER — Encounter: Payer: Self-pay | Admitting: General Practice

## 2012-07-07 ENCOUNTER — Telehealth: Payer: Self-pay | Admitting: Nurse Practitioner

## 2012-07-07 MED ORDER — ALPRAZOLAM 0.5 MG PO TABS: 0.5000 mg | ORAL_TABLET | Freq: Three times a day (TID) | ORAL | Status: DC | PRN

## 2012-07-07 MED ORDER — OXYCODONE-ACETAMINOPHEN 10-325 MG PO TABS: 1.0000 | ORAL_TABLET | Freq: Four times a day (QID) | ORAL | Status: DC | PRN

## 2012-07-07 NOTE — Telephone Encounter (Signed)
Notified Rosetta that Percocet and Xanax are available to pickup and that there are available refills for Zofran at [facility].  Patient is sending daughter to pick them up.

## 2012-07-07 NOTE — Telephone Encounter (Signed)
[facility] called about xanax [facility]ill had a question

## 2012-07-07 NOTE — Telephone Encounter (Signed)
Rx message initiated and routed to Bennie Pierini, FNP

## 2012-07-07 NOTE — Telephone Encounter (Signed)
Patient's daughter came by with 2 other gentleman to pickup her prescriptions. Daughter stated that patient had appt for hosp f/u today but was too sick to leave the house.  She c/o nausea and vomiting which the daughter attributes to being out of her "nerve medicine" for 2 days.  Patient recently released from the hospital and evaluated for chest discomfort.  Told to f/u with PCP.  Appt rescheduled for tomorrow but patient's daughter requests that we refill the medications today.  She requested Percocet, Xanax, and Zofran.  [facility] for Zo[facility].

## 2012-07-07 NOTE — Telephone Encounter (Signed)
Call about rx details.

## 2012-07-08 ENCOUNTER — Encounter: Payer: Self-pay | Admitting: Nurse Practitioner

## 2012-07-08 ENCOUNTER — Ambulatory Visit (INDEPENDENT_AMBULATORY_CARE_PROVIDER_SITE_OTHER): Payer: Medicare Other | Admitting: Nurse Practitioner

## 2012-07-08 DIAGNOSIS — K219 Gastro-esophageal reflux disease without esophagitis: Secondary | ICD-10-CM

## 2012-07-08 DIAGNOSIS — E785 Hyperlipidemia, unspecified: Secondary | ICD-10-CM

## 2012-07-08 DIAGNOSIS — E559 Vitamin D deficiency, unspecified: Secondary | ICD-10-CM

## 2012-07-08 MED ORDER — ERGOCALCIFEROL 1.25 MG (50000 UT) PO CAPS: 50000.0000 [IU] | ORAL_CAPSULE | ORAL | Status: DC

## 2012-07-08 MED ORDER — FENOFIBRATE 160 MG PO TABS: 160.0000 mg | ORAL_TABLET | Freq: Every day | ORAL | Status: DC

## 2012-07-08 MED ORDER — OMEPRAZOLE 40 MG PO CPDR: 40.0000 mg | DELAYED_RELEASE_CAPSULE | Freq: Every day | ORAL | Status: DC

## 2012-07-08 NOTE — Telephone Encounter (Signed)
Spoke [facility]th [facility] yesterday. Make sure was corrected. Told to change order to QID!

## 2012-07-08 NOTE — Telephone Encounter (Signed)
[facility] needs to clarify [facility]ax dose, pls call, u sent something by e scribe and doesn't match previous order?

## 2012-07-08 NOTE — Patient Instructions (Signed)
Heart Failure  Heart failure (HF) is a condition in which the heart has trouble pumping blood. This means your heart does not pump blood efficiently for your body to work well. In some cases of HF, fluid may back up into your lungs or you may have swelling (edema) in your lower legs. HF is a long-term (chronic) condition. It is important for you to take good care of yourself and follow your caregiver's treatment plan.  CAUSES   · Health conditions:  · High blood pressure (hypertension) causes the heart muscle to work harder than normal. When pressure in the blood vessels is high, the heart needs to pump (contract) with more force in order to circulate blood throughout the body. High blood pressure eventually causes the heart to become stiff and weak.  · Coronary artery disease (CAD) is the buildup of cholesterol and fat (plaques) in the arteries of the heart. The blockage in the arteries deprives the heart muscle of oxygen and blood. This can cause chest pain and may lead to a heart attack. High blood pressure can also contribute to CAD.  · Heart attack (myocardial infarction) occurs when 1 or more arteries in the heart become blocked. The loss of oxygen damages the muscle tissue of the heart. When this happens, part of the heart muscle dies. The injured tissue does not contract as well and weakens the heart's ability to pump blood.  · Abnormal heart valves can cause HF when the heart valves do not open and close properly. This makes the heart muscle pump harder to keep the blood flowing.  · Heart muscle disease (cardiomyopathy or myocarditis) is damage to the heart muscle from a variety of causes. These can include drug or alcohol abuse, infections, or unknown reasons. These can increase the risk of HF.  · Lung disease makes the heart work harder because the lungs do not work properly. This can cause a strain on the heart leading it to fail.  · Diabetes increases the risk of HF. High blood sugar contributes to high  fat (lipid) levels in the blood. Diabetes can also cause slow damage to tiny blood vessels that carry important nutrients to the heart muscle. When the heart does not get enough oxygen and food, it can cause the heart to become weak and stiff. This leads to a heart that does not contract efficiently.  · Other diseases can contribute to HF. These include abnormal heart rhythms, thyroid problems, and low blood counts (anemia).  · Unhealthy lifestyle habits:  · Obesity.  · Smoking.  · Eating foods high in fat and cholesterol.  · Eating or drinking beverages high in salt.  · Drug or alcohol abuse.  · Lack of exercise.  SYMPTOMS   HF symptoms may vary and can be hard to detect. Symptoms may include:  · Shortness of breath with activity, such as climbing stairs.  · Persistent cough.  · Swelling of the feet, ankles, legs, or abdomen.  · Unexplained weight gain.  · Difficulty breathing when lying flat.  · Waking from sleep because of the need to sit up and get more air.  · Rapid heartbeat.  · Fatigue and loss of energy.  · Feeling lightheaded or close to fainting.  DIAGNOSIS   A diagnosis of HF is based on your history, symptoms, physical examination, and diagnostic tests.  Diagnostic tests for HF may include:  · EKG.  · Chest X-ray.  · Blood tests.  · Exercise stress test.  · Blood   oxygen test (arterial blood gas).  · Evaluation by a heart doctor (cardiologist).  · Ultrasound evaluation of the heart (echocardiogram).  · Heart artery test to look for blockages (angiogram).  · Radioactive imaging to look at the heart (radionuclide test).  TREATMENT   Treatment is aimed at managing the symptoms of HF. Medicines, lifestyle changes, or surgical intervention may be necessary to treat HF.  · Medicines to help treat HF may include:  · Angiotensin-converting enzyme (ACE) inhibitors. These block the effects of a blood protein called angiotensin-converting enzyme. ACE inhibitors relax (dilate) the blood vessels and help lower blood  pressure. This decreases the workload of the heart, slows the progression of HF, and improves symptoms.  · Angiotensin receptor blockers (ARBs). These medications work similar to ACE inhibitors. ARBs may be an alternative for people who cannot tolerate an ACE inhibitor.  · Aldosterone antagonists. This medication helps get rid of extra fluid from your body. This lowers the volume of blood the heart has to pump.  · Water pills (diuretics). Diuretics cause the kidneys to remove salt and water from the blood. The extra fluid is removed by urination. By removing extra fluid from the body, diuretics help lower the workload of the heart and help prevent fluid buildup in the lungs so breathing is easier.  · Beta blockers. These prevent the heart from beating too fast and improve heart muscle strength. Beta blockers help maintain a normal heart rate, control blood pressure, and improve HF symptoms.  · Digitalis. This increases the force of the heartbeat and may be helpful to people with HF or heart rhythm problems.  · Healthy lifestyle changes include:  · Stopping smoking.  · Eating a healthy diet. Avoid foods high in fat. Avoid foods fried in oil or made with fat. A dietician can help with healthy food choices.  · Limiting how much salt you eat.  · Limiting alcohol intake to no more than 1 drink per day for women and 2 drinks per day for men. Drinking more than that is harmful to your heart. If your heart has already been damaged by alcohol or you have severe HF, drinking alcohol should be stopped completely.  · Exercising as directed by your caregiver.  · Surgical treatment for HF may include:  · Procedures to open blocked arteries, repair damaged heart valves, or remove damaged heart muscle tissue.  · A pacemaker to help heart muscle function and to control certain abnormal heart rhythms.  · A defibrillator to possibly prevent sudden cardiac death.  HOME CARE INSTRUCTIONS   · Activity level. Your caregiver can help you  determine what type of exercise program may be helpful. It is important to maintain your strength. Pace your physical activity to avoid shortness of breath or chest pain. Rest for 1 hour before and after meals. A cardiac rehabilitation program may be helpful to some people with HF.  · Diet. Eat a heart healthy diet. Food choices should be low in saturated fat and cholesterol. Talk to a dietician to learn about heart healthy foods.  · Salt intake. When you have HF, you need to limit the amount of salt you eat. Eat less than 1500 milligrams (mg) of salt per day or as recommended by your caregiver.  · Weight monitoring. Weigh yourself every day. You should weigh yourself in the morning after you urinate and before you eat breakfast. Wear the same amount of clothing each time you weigh yourself. Record your weight daily. Bring your recorded   weights to your clinic visits. Tell your caregiver right away if you have gained 3 lb/1.4 kg in 1 day, or 5 lb/2.3 kg in a week or whatever amount you were told to report.  · Blood pressure monitoring. This should be done as directed by your caregiver. A home blood pressure cuff can be purchased at a drugstore. Record your blood pressure numbers and bring them to your clinic visits. Tell your caregiver if you become dizzy or lightheaded upon standing up.  · Smoking. If you are currently a smoker, it is time to quit. Nicotine makes your heart work harder by causing your blood vessels to constrict. Do not use nicotine gum or patches before talking to your caregiver.  · Follow up. Be sure to schedule a follow-up visit with your caregiver. Keep all your appointments.  SEEK MEDICAL CARE IF:   · Your weight increases by 3 lb/1.4 kg in 1 day or 5 lb/2.3 kg in a week.  · You notice increasing shortness of breath that is unusual for you. This may happen during rest, sleep, or with activity.  · You cough more than normal, especially with physical activity.  · You notice more swelling in your  hands, feet, ankles, or belly (abdomen).  · You are unable to sleep because it is hard to breathe.  · You cough up bloody mucus (sputum).  · You begin to feel "jumping" or "fluttering" sensations (palpitations) in your chest.  SEEK IMMEDIATE MEDICAL CARE IF:   · You have severe chest pain or pressure which may include symptoms such as:  · Pain or pressure in the arms, neck, jaw, or back.  · Feeling sweaty.  · Feeling sick to your stomach (nauseous).  · Feeling short of breath while at rest.  · Having a fast or irregular heartbeat.  · You experience stroke symptoms. These symptoms include:  · Facial weakness or numbness.  · Weakness or numbness in an arm, leg, or on one side of your body.  · Blurred vision.  · Difficulty talking or thinking.  · Dizziness or fainting.  · Severe headache.  MAKE SURE YOU:   · Understand these instructions.  · Will watch your condition.  · Will get help right away if you are not doing well or get worse.  Document Released: 04/06/2005 Document Revised: 10/06/2011 Document Reviewed: 07/19/2009  [employer]® Patient Information ©2013 [employer].

## 2012-07-08 NOTE — Progress Notes (Signed)
  Subjective:    Patient ID: Aimee Sanders, female    DOB: January 08, 1946, 67 y.o.   MRN: [id number]  HPI Was hospitalized end of Feb for CHF and MI. Pt feeling better. Pt has complaint of pain under L breast, hoarse voice, and burning sensation down throat and into chest. Pt receiving PT at home 2x/wk. Tolerating well.    Review of Systems  HENT: Positive for voice change.   Cardiovascular: Negative for chest pain.  Neurological: Positive for dizziness and headaches.       Objective:   Physical Exam  Constitutional: She appears well-developed and well-nourished.  Cardiovascular: Normal rate, regular rhythm and normal heart sounds.   2+ edema bil  Pulmonary/Chest: She has decreased breath sounds in the right lower field and the left lower field. She has rales (lower bases).  SaO2: 94-98%   Musculoskeletal: She exhibits edema.  Skin: Skin is warm and dry.    BP 112/65  Pulse 65  Temp(Src) 97.9 F (36.6 C) (Oral)  LMP 07/08/1972        Assessment & Plan:  Hospital FU/CHF/MI  Continue all meds  Continue O2 at home  Limited activity if SOB  Limit fluid intake  F/U PRN  Mary-Margaret Daphine Deutscher, FNP

## 2012-07-08 NOTE — Progress Notes (Signed)
This encounter was created in error - please disregard.

## 2012-07-12 NOTE — Progress Notes (Signed)
This encounter was created in error - please disregard.

## 2012-07-14 ENCOUNTER — Encounter: Payer: Self-pay | Admitting: Physician Assistant

## 2012-07-14 ENCOUNTER — Ambulatory Visit (INDEPENDENT_AMBULATORY_CARE_PROVIDER_SITE_OTHER): Payer: PRIVATE HEALTH INSURANCE | Admitting: Physician Assistant

## 2012-07-14 ENCOUNTER — Telehealth: Payer: Self-pay | Admitting: Nurse Practitioner

## 2012-07-14 VITALS — BP 99/74 | HR 82 | Ht 62.0 in | Wt 129.0 lb

## 2012-07-14 DIAGNOSIS — I80299 Phlebitis and thrombophlebitis of other deep vessels of unspecified lower extremity: Secondary | ICD-10-CM

## 2012-07-14 DIAGNOSIS — I25810 Atherosclerosis of coronary artery bypass graft(s) without angina pectoris: Secondary | ICD-10-CM

## 2012-07-14 DIAGNOSIS — I2510 Atherosclerotic heart disease of native coronary artery without angina pectoris: Secondary | ICD-10-CM

## 2012-07-14 DIAGNOSIS — E785 Hyperlipidemia, unspecified: Secondary | ICD-10-CM

## 2012-07-14 DIAGNOSIS — I509 Heart failure, unspecified: Secondary | ICD-10-CM

## 2012-07-14 DIAGNOSIS — Z79899 Other long term (current) drug therapy: Secondary | ICD-10-CM

## 2012-07-14 DIAGNOSIS — I5042 Chronic combined systolic (congestive) and diastolic (congestive) heart failure: Secondary | ICD-10-CM

## 2012-07-14 DIAGNOSIS — I959 Hypotension, unspecified: Secondary | ICD-10-CM

## 2012-07-14 MED ORDER — BISOPROLOL FUMARATE 5 MG PO TABS: 2.5000 mg | ORAL_TABLET | Freq: Every day | ORAL | Status: DC

## 2012-07-14 NOTE — Patient Instructions (Addendum)
   Labs for BMET, CBC  Echo  Office will contact with results  Stop Lopressor  Begin Bisoprolol 2.5mg  daily  Stop Aspirin  Continue all other current medications. Follow up in  1 month

## 2012-07-14 NOTE — Assessment & Plan Note (Signed)
Continued conservative management is recommended. Recent presentation with atypical CP, and NL cardiac markers. Nuclear imaging study was suggestive of septal ischemia, but technically difficult. Continue current regimen with low dose ASA, statin, and Imdur. Given her significant underlying COPD (O2 dependent), I will substitute Lopressor (held last 2 days secondary to hypotension) and substituted with the more beta 1 selective beta blocker, bisoprolol, at the low dose of 2.5 daily.

## 2012-07-14 NOTE — Assessment & Plan Note (Signed)
We'll reassess LVF with a complete echocardiogram. Will also check post hospital labs, including BNP level. Continue current dose of Lasix, pending review of labs.

## 2012-07-14 NOTE — Assessment & Plan Note (Signed)
Patient remains on Xarelto anticoagulation. Therefore, will DC ASA.

## 2012-07-14 NOTE — Progress Notes (Signed)
Primary Cardiologist: Simona Huh, MD (new)   HPI: Post hospital followup from [facility], seen in consultation by our team for evaluation of CP. Serial cardiac markers within NL . CP noted to be chronic and reproducible by palpation. Lexiscan Cardiolite, performed March 7, suggestive of  possible septal ischemia; however, the study was technically difficult. Conservative management was recommended.  Patient presents today with her daughters. She is a somewhat poor historian, essentially complaining of CP with movement, deep inspiration, or ambulation. Her daughter monitors her closely, however, and is concerned about her recent 7 pound weight gain/24 hours. She is compliant with her medications, which includes Lasix. She is weighed daily. Of note, she has not yet had followup labs.  Also of note, patient continues to smoke, approximately 1/3 ppd. She is on continuous oxygen, and also requires nebulizer therapy.  12-lead EKG today, reviewed by me, indicates SB 82 bpm with occasional PVC  Allergies  Allergen Reactions  . Penicillins Hives and Itching    Current Outpatient Prescriptions  Medication Sig Dispense Refill  . albuterol (ACCUNEB) 0.63 MG/3ML nebulizer solution Take 1 ampule by nebulization every 6 (six) hours as needed. For wheezing      . ALPRAZolam (XANAX) 0.5 MG tablet Take 1 tablet (0.5 mg total) by mouth 3 (three) times daily as needed for sleep or anxiety.  120 tablet  0  . aspirin EC 81 MG EC tablet Take 1 tablet (81 mg total) by mouth daily.      Marland Kitchen atorvastatin (LIPITOR) 40 MG tablet Take 40 mg by mouth daily.      . citalopram (CELEXA) 20 MG tablet Take 1 tablet (20 mg total) by mouth daily.  30 tablet  1  . ergocalciferol (VITAMIN D2) 50000 UNITS capsule Take 1 capsule (50,000 Units total) by mouth once a week.  4 capsule  12  . fenofibrate 160 MG tablet Take 1 tablet (160 mg total) by mouth daily.  30 tablet  3  . Fluticasone-Salmeterol (ADVAIR) 250-50 MCG/DOSE AEPB Inhale 1  puff into the lungs every 12 (twelve) hours.  60 each  4  . folic acid (FOLVITE) 1 MG tablet Take 1 mg by mouth daily.      . furosemide (LASIX) 80 MG tablet Take 0.5 tablets (40 mg total) by mouth 2 (two) times daily.  60 tablet  1  . gabapentin (NEURONTIN) 300 MG capsule Take 300 mg by mouth 3 (three) times daily.       Marland Kitchen guaiFENesin (MUCINEX) 600 MG 12 hr tablet Take 1 tablet (600 mg total) by mouth 2 (two) times daily.  14 tablet  0  . ipratropium-albuterol (DUONEB) 0.5-2.5 (3) MG/3ML SOLN Take 3 mLs by nebulization 3 (three) times daily. For shortness of breath or wheezing  360 mL  3  . isosorbide mononitrate (IMDUR) 30 MG 24 hr tablet Take 1 tablet (30 mg total) by mouth daily.  30 tablet  4  . KLOR-CON 10 10 MEQ tablet Take 10 mEq by mouth daily.       . montelukast (SINGULAIR) 10 MG tablet Take 1 tablet (10 mg total) by mouth at bedtime.  30 tablet  4  . nitroGLYCERIN (NITROSTAT) 0.4 MG SL tablet Place 0.4 mg under the tongue every 5 (five) minutes as needed. For chest pain      . omeprazole (PRILOSEC) 40 MG capsule Take 1 capsule (40 mg total) by mouth daily.  30 capsule  3  . ondansetron (ZOFRAN) 4 MG tablet Take 1 tablet (4  mg total) by mouth every 6 (six) hours.  12 tablet  0  . oxyCODONE-acetaminophen (PERCOCET) 10-325 MG per tablet Take 1 tablet by mouth every 6 (six) hours as needed for pain. For pain  120 tablet  0  . promethazine (PHENERGAN) 25 MG tablet Take 0.5-1 tablets (12.5-25 mg total) by mouth every 6 (six) hours as needed for nausea. For nausea  30 tablet  0  . Rivaroxaban (XARELTO) 20 MG TABS Take 20 mg by mouth daily.      Marland Kitchen tiotropium (SPIRIVA) 18 MCG inhalation capsule Place 1 capsule (18 mcg total) into inhaler and inhale daily.  30 capsule  4  . metoprolol tartrate (LOPRESSOR) 25 MG tablet Take 25 mg by mouth 2 (two) times daily.       No current facility-administered medications for this visit.    Past Medical History  Diagnosis Date  . Chronic combined systolic  and diastolic CHF (congestive heart failure)     a. 11/2011 Echo: EF 40-45% w/ Gr 1 DD.  Marland Kitchen Stroke   . Deep vein thrombosis     a. Chronic Xarelto.  . Hyperlipidemia   . Sleep apnea   . 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 Coronary artery disease     a. reportedly s/p MI x 2;  b. 2012 Myoview: EF 50% with a large lateral defect consistent with prior myocardial infarction-> Med Rx.  . Chronic pain     due to prior hip fracture and DDD back  . Shortness of breath     Past Surgical History  Procedure Laterality Date  . Appendectomy    . Back surgery    . Cholecystectomy    . Coronary stent placement    . Breast surgery      benign tumors  . Carpal tunnel release    . Back surgery    . Right hip replacement      History   Social History  . Marital Status: Married    Spouse Name: N/A    Number of Children: N/A  . Years of Education: N/A   Occupational History  . Not on file.   Social History Main Topics  . Smoking status: Current Every Day Smoker -- 0.50 packs/day for 40 years    Types: Cigarettes  . Smokeless tobacco: Not on file     Comment: started smoking in 1969  . Alcohol Use: No  . Drug Use: No  . Sexually Active: No   Other Topics Concern  . Not on file   Social History Narrative  . No narrative on file    Family History  Problem Relation Age of Onset  . Hypertension Mother   . Breast cancer Mother   . Rheum arthritis Maternal Grandmother   . Cancer Sister     ROS: no nausea, vomiting; no fever, chills; no melena, hematochezia; no claudication  PHYSICAL EXAM: BP 99/74  Pulse 82  Ht 5\' 2"  (1.575 m)  Wt 129 lb (58.514 kg)  BMI 23.59 kg/m2  LMP 07/08/1972 GENERAL: 67 year old female, very frail appearing, sitting in wheelchair, oxygen tank alongside; NAD HEENT: NCAT, PERRLA, EOMI; sclera clear; no xanthelasma; nasal cannula NECK: palpable bilateral carotid pulses, no bruits;  no JVD; no TM LUNGS: Diminished breath sounds throughout, mild rhonchi and faint expiratory wheezes CARDIAC: RRR (S1, S2); no significant murmurs; no rubs or gallops ABDOMEN: soft, non-tender; intact BS EXTREMETIES: Trace peripheral  peripheral edema SKIN: warm/dry; no obvious rash/lesions MUSCULOSKELETAL: no joint deformity NEURO: no focal deficit; NL affect   EKG: reviewed and available in Electronic Records   ASSESSMENT & PLAN:  Coronary artery disease Continued conservative management is recommended. Recent presentation with atypical CP, and NL cardiac markers. Nuclear imaging study was suggestive of septal ischemia, but technically difficult. Continue current regimen with low dose ASA, statin, and Imdur. Given her significant underlying COPD (O2 dependent), I will substitute Lopressor (held last 2 days secondary to hypotension) and substituted with the more beta 1 selective beta blocker, bisoprolol, at the low dose of 2.5 daily.  Chronic combined systolic and diastolic CHF (congestive heart failure) We'll reassess LVF with a complete echocardiogram. Will also check post hospital labs, including BNP level. Continue current dose of Lasix, pending review of labs.  HYPERLIPIDEMIA Decrease Lipitor to 20 mg daily. Recent LDL 23.  Hypotension, unspecified Lopressor held x2 days, per daughter's recommendation, in light of hypotension. Today's BP 99/74. Lopressor will be substituted with low dose bisoprolol, for reasons as outlined above. Will continue to closely monitor BP.  DEEP VEIN THROMBOSIS/PHLEBITIS Patient remains on Xarelto anticoagulation. Therefore, will DC ASA.    Gene Tiffanie Blassingame, PAC

## 2012-07-14 NOTE — Assessment & Plan Note (Signed)
Lopressor held x2 days, per daughter's recommendation, in light of hypotension. Today's BP 99/74. Lopressor will be substituted with low dose bisoprolol, for reasons as outlined above. Will continue to closely monitor BP.

## 2012-07-14 NOTE — Assessment & Plan Note (Addendum)
Decrease Lipitor to 20 mg daily. Recent LDL 23.

## 2012-07-15 ENCOUNTER — Telehealth: Payer: Self-pay | Admitting: *Deleted

## 2012-07-15 ENCOUNTER — Other Ambulatory Visit: Payer: Self-pay | Admitting: Nurse Practitioner

## 2012-07-15 MED ORDER — AZITHROMYCIN 250 MG PO TABS: ORAL_TABLET | ORAL | Status: DC

## 2012-07-15 NOTE — Telephone Encounter (Signed)
Please advise 

## 2012-07-15 NOTE — Telephone Encounter (Signed)
Patient notified that rx was sent to pharmacy  ?

## 2012-07-15 NOTE — Telephone Encounter (Signed)
error 

## 2012-07-15 NOTE — Telephone Encounter (Signed)
z-pac called into pharmacy

## 2012-07-18 ENCOUNTER — Other Ambulatory Visit: Payer: Self-pay

## 2012-07-18 DIAGNOSIS — I509 Heart failure, unspecified: Secondary | ICD-10-CM

## 2012-07-18 DIAGNOSIS — I4891 Unspecified atrial fibrillation: Secondary | ICD-10-CM

## 2012-07-18 DIAGNOSIS — I2510 Atherosclerotic heart disease of native coronary artery without angina pectoris: Secondary | ICD-10-CM

## 2012-07-18 MED ORDER — FUROSEMIDE 80 MG PO TABS
40.0000 mg | ORAL_TABLET | Freq: Two times a day (BID) | ORAL | Status: DC
Start: 2012-07-18 — End: 2012-07-26

## 2012-07-18 NOTE — Telephone Encounter (Signed)
Sent from pharmacy as 1/2 tab 80 mg twice a day  In chart has 40 mg 1 in AM 1/2 afternoon

## 2012-07-20 ENCOUNTER — Other Ambulatory Visit: Payer: Self-pay | Admitting: *Deleted

## 2012-07-20 MED ORDER — FLUTICASONE-SALMETEROL 250-50 MCG/DOSE IN AEPB: 1.0000 | INHALATION_SPRAY | Freq: Two times a day (BID) | RESPIRATORY_TRACT | Status: DC

## 2012-07-21 ENCOUNTER — Other Ambulatory Visit: Payer: PRIVATE HEALTH INSURANCE

## 2012-07-26 ENCOUNTER — Telehealth: Payer: Self-pay | Admitting: *Deleted

## 2012-07-26 ENCOUNTER — Inpatient Hospital Stay (HOSPITAL_COMMUNITY)
Admission: EM | Admit: 2012-07-26 | Discharge: 2012-07-28 | DRG: 193 | Disposition: A | Discharge status: Home / Self Care | Payer: PRIVATE HEALTH INSURANCE | Attending: Internal Medicine | Admitting: Internal Medicine

## 2012-07-26 ENCOUNTER — Encounter (HOSPITAL_COMMUNITY): Payer: Self-pay | Admitting: Vascular Surgery

## 2012-07-26 ENCOUNTER — Emergency Department (HOSPITAL_COMMUNITY): Payer: PRIVATE HEALTH INSURANCE

## 2012-07-26 DIAGNOSIS — Z7901 Long term (current) use of anticoagulants: Secondary | ICD-10-CM

## 2012-07-26 DIAGNOSIS — I5042 Chronic combined systolic (congestive) and diastolic (congestive) heart failure: Secondary | ICD-10-CM | POA: Diagnosis present

## 2012-07-26 DIAGNOSIS — R6251 Failure to thrive (child): Secondary | ICD-10-CM

## 2012-07-26 DIAGNOSIS — E871 Hypo-osmolality and hyponatremia: Secondary | ICD-10-CM | POA: Diagnosis present

## 2012-07-26 DIAGNOSIS — I509 Heart failure, unspecified: Secondary | ICD-10-CM

## 2012-07-26 DIAGNOSIS — R911 Solitary pulmonary nodule: Secondary | ICD-10-CM

## 2012-07-26 DIAGNOSIS — G4730 Sleep apnea, unspecified: Secondary | ICD-10-CM | POA: Diagnosis present

## 2012-07-26 DIAGNOSIS — E876 Hypokalemia: Secondary | ICD-10-CM | POA: Diagnosis present

## 2012-07-26 DIAGNOSIS — F17200 Nicotine dependence, unspecified, uncomplicated: Secondary | ICD-10-CM | POA: Diagnosis present

## 2012-07-26 DIAGNOSIS — Z8673 Personal history of transient ischemic attack (TIA), and cerebral infarction without residual deficits: Secondary | ICD-10-CM

## 2012-07-26 DIAGNOSIS — I313 Pericardial effusion (noninflammatory): Secondary | ICD-10-CM

## 2012-07-26 DIAGNOSIS — I5043 Acute on chronic combined systolic (congestive) and diastolic (congestive) heart failure: Secondary | ICD-10-CM | POA: Diagnosis present

## 2012-07-26 DIAGNOSIS — E785 Hyperlipidemia, unspecified: Secondary | ICD-10-CM | POA: Diagnosis present

## 2012-07-26 DIAGNOSIS — I80299 Phlebitis and thrombophlebitis of other deep vessels of unspecified lower extremity: Secondary | ICD-10-CM

## 2012-07-26 DIAGNOSIS — Z96649 Presence of unspecified artificial hip joint: Secondary | ICD-10-CM

## 2012-07-26 DIAGNOSIS — Z79899 Other long term (current) drug therapy: Secondary | ICD-10-CM

## 2012-07-26 DIAGNOSIS — J154 Pneumonia due to other streptococci: Principal | ICD-10-CM | POA: Diagnosis present

## 2012-07-26 DIAGNOSIS — I2510 Atherosclerotic heart disease of native coronary artery without angina pectoris: Secondary | ICD-10-CM | POA: Diagnosis present

## 2012-07-26 DIAGNOSIS — D649 Anemia, unspecified: Secondary | ICD-10-CM

## 2012-07-26 DIAGNOSIS — D689 Coagulation defect, unspecified: Secondary | ICD-10-CM

## 2012-07-26 DIAGNOSIS — I5023 Acute on chronic systolic (congestive) heart failure: Secondary | ICD-10-CM

## 2012-07-26 DIAGNOSIS — J189 Pneumonia, unspecified organism: Secondary | ICD-10-CM

## 2012-07-26 DIAGNOSIS — F411 Generalized anxiety disorder: Secondary | ICD-10-CM | POA: Diagnosis present

## 2012-07-26 DIAGNOSIS — J449 Chronic obstructive pulmonary disease, unspecified: Secondary | ICD-10-CM | POA: Diagnosis present

## 2012-07-26 DIAGNOSIS — Z9861 Coronary angioplasty status: Secondary | ICD-10-CM

## 2012-07-26 DIAGNOSIS — I200 Unstable angina: Secondary | ICD-10-CM

## 2012-07-26 DIAGNOSIS — J9610 Chronic respiratory failure, unspecified whether with hypoxia or hypercapnia: Secondary | ICD-10-CM

## 2012-07-26 DIAGNOSIS — I252 Old myocardial infarction: Secondary | ICD-10-CM

## 2012-07-26 DIAGNOSIS — J441 Chronic obstructive pulmonary disease with (acute) exacerbation: Secondary | ICD-10-CM | POA: Diagnosis present

## 2012-07-26 DIAGNOSIS — Z86718 Personal history of other venous thrombosis and embolism: Secondary | ICD-10-CM

## 2012-07-26 DIAGNOSIS — J9620 Acute and chronic respiratory failure, unspecified whether with hypoxia or hypercapnia: Secondary | ICD-10-CM | POA: Diagnosis present

## 2012-07-26 DIAGNOSIS — I739 Peripheral vascular disease, unspecified: Secondary | ICD-10-CM

## 2012-07-26 DIAGNOSIS — I6789 Other cerebrovascular disease: Secondary | ICD-10-CM

## 2012-07-26 DIAGNOSIS — I959 Hypotension, unspecified: Secondary | ICD-10-CM

## 2012-07-26 DIAGNOSIS — I219 Acute myocardial infarction, unspecified: Secondary | ICD-10-CM

## 2012-07-26 HISTORY — DX: Anxiety disorder, unspecified: F41.9

## 2012-07-26 HISTORY — DX: Unspecified osteoarthritis, unspecified site: M19.90

## 2012-07-26 HISTORY — DX: Personal history of other diseases of the digestive system: Z87.19

## 2012-07-26 HISTORY — DX: Pneumonia, unspecified organism: J18.9

## 2012-07-26 HISTORY — DX: Depression, unspecified: F32.A

## 2012-07-26 HISTORY — DX: Other intervertebral disc degeneration, lumbosacral region without mention of lumbar back pain or lower extremity pain: M51.379

## 2012-07-26 HISTORY — DX: Other intervertebral disc degeneration, lumbosacral region: M51.37

## 2012-07-26 HISTORY — DX: Peripheral vascular disease, unspecified: I73.9

## 2012-07-26 HISTORY — DX: Major depressive disorder, single episode, unspecified: F32.9

## 2012-07-26 LAB — POCT I-STAT TROPONIN I

## 2012-07-26 LAB — CBC
HCT: 29.8 % — ABNORMAL LOW (ref 36.0–46.0)
Hemoglobin: 10.3 g/dL — ABNORMAL LOW (ref 12.0–15.0)
MCH: 30.7 pg (ref 26.0–34.0)
MCHC: 34.6 g/dL (ref 30.0–36.0)
MCV: 89 fL (ref 78.0–100.0)
RDW: 14.7 % (ref 11.5–15.5)

## 2012-07-26 LAB — COMPREHENSIVE METABOLIC PANEL
Albumin: 2.2 g/dL — ABNORMAL LOW (ref 3.5–5.2)
Alkaline Phosphatase: 65 U/L (ref 39–117)
BUN: 13 mg/dL (ref 6–23)
Creatinine, Ser: 0.81 mg/dL (ref 0.50–1.10)
GFR calc Af Amer: 85 mL/min — ABNORMAL LOW (ref >90)
Glucose, Bld: 94 mg/dL (ref 70–99)
Potassium: 2.8 mEq/L — ABNORMAL LOW (ref 3.5–5.1)
Total Protein: 5.1 g/dL — ABNORMAL LOW (ref 6.0–8.3)

## 2012-07-26 LAB — URINALYSIS, ROUTINE W REFLEX MICROSCOPIC
Ketones, ur: NEGATIVE mg/dL
Leukocytes, UA: NEGATIVE
Nitrite: NEGATIVE
Protein, ur: NEGATIVE mg/dL
pH: 6.5 (ref 5.0–8.0)

## 2012-07-26 LAB — MRSA PCR SCREENING: MRSA by PCR: NEGATIVE

## 2012-07-26 LAB — LACTIC ACID, PLASMA: Lactic Acid, Venous: 3.6 mmol/L — ABNORMAL HIGH (ref 0.5–2.2)

## 2012-07-26 LAB — PRO B NATRIURETIC PEPTIDE: Pro B Natriuretic peptide (BNP): 1873 pg/mL — ABNORMAL HIGH (ref 0–125)

## 2012-07-26 LAB — TROPONIN I: Troponin I: <0.3 ng/mL (ref <0.30)

## 2012-07-26 MED ORDER — ONDANSETRON HCL 4 MG/2ML IJ SOLN: 4.0000 mg | Freq: Four times a day (QID) | INTRAMUSCULAR | Status: DC | PRN

## 2012-07-26 MED ORDER — ATORVASTATIN CALCIUM 40 MG PO TABS
40.0000 mg | ORAL_TABLET | Freq: Every day | ORAL | Status: DC
  Administered 2012-07-26 – 2012-07-28 (×3): 40 mg via ORAL
  Filled 2012-07-26 (×3): qty 1

## 2012-07-26 MED ORDER — GABAPENTIN 300 MG PO CAPS
300.0000 mg | ORAL_CAPSULE | Freq: Every day | ORAL | Status: DC
  Administered 2012-07-26 – 2012-07-28 (×9): 300 mg via ORAL
  Filled 2012-07-26 (×13): qty 1

## 2012-07-26 MED ORDER — VANCOMYCIN HCL IN DEXTROSE 1-5 GM/200ML-% IV SOLN
1000.0000 mg | Freq: Once | INTRAVENOUS | Status: AC
  Administered 2012-07-26: 1000 mg via INTRAVENOUS
  Filled 2012-07-26: qty 200

## 2012-07-26 MED ORDER — POTASSIUM CHLORIDE CRYS ER 20 MEQ PO TBCR
40.0000 meq | EXTENDED_RELEASE_TABLET | Freq: Once | ORAL | Status: AC
  Administered 2012-07-26: 40 meq via ORAL
  Filled 2012-07-26: qty 2

## 2012-07-26 MED ORDER — SODIUM CHLORIDE 0.9 % IV SOLN: 250.0000 mL | INTRAVENOUS | Status: DC | PRN

## 2012-07-26 MED ORDER — ISOSORBIDE MONONITRATE ER 30 MG PO TB24
30.0000 mg | ORAL_TABLET | Freq: Every day | ORAL | Status: DC
  Administered 2012-07-26 – 2012-07-28 (×3): 30 mg via ORAL
  Filled 2012-07-26 (×3): qty 1

## 2012-07-26 MED ORDER — LEVALBUTEROL HCL 0.63 MG/3ML IN NEBU
0.6300 mg | INHALATION_SOLUTION | Freq: Four times a day (QID) | RESPIRATORY_TRACT | Status: DC
  Administered 2012-07-26: 0.63 mg via RESPIRATORY_TRACT
  Filled 2012-07-26: qty 3

## 2012-07-26 MED ORDER — VANCOMYCIN HCL IN DEXTROSE 1-5 GM/200ML-% IV SOLN: 1000.0000 mg | Freq: Once | INTRAVENOUS | Status: DC

## 2012-07-26 MED ORDER — ONDANSETRON HCL 4 MG PO TABS: 4.0000 mg | ORAL_TABLET | Freq: Four times a day (QID) | ORAL | Status: DC | PRN

## 2012-07-26 MED ORDER — DEXTROSE 5 % IV SOLN
1.0000 g | Freq: Three times a day (TID) | INTRAVENOUS | Status: DC
  Filled 2012-07-26 (×2): qty 1

## 2012-07-26 MED ORDER — FOLIC ACID 1 MG PO TABS
1.0000 mg | ORAL_TABLET | Freq: Every day | ORAL | Status: DC
  Administered 2012-07-26 – 2012-07-28 (×3): 1 mg via ORAL
  Filled 2012-07-26 (×3): qty 1

## 2012-07-26 MED ORDER — ACETAMINOPHEN 650 MG RE SUPP: 650.0000 mg | Freq: Four times a day (QID) | RECTAL | Status: DC | PRN

## 2012-07-26 MED ORDER — PREDNISONE 20 MG PO TABS
60.0000 mg | ORAL_TABLET | Freq: Once | ORAL | Status: AC
  Administered 2012-07-26: 60 mg via ORAL
  Filled 2012-07-26: qty 3

## 2012-07-26 MED ORDER — FENOFIBRATE 160 MG PO TABS
160.0000 mg | ORAL_TABLET | Freq: Every day | ORAL | Status: DC
  Administered 2012-07-26 – 2012-07-28 (×3): 160 mg via ORAL
  Filled 2012-07-26 (×3): qty 1

## 2012-07-26 MED ORDER — MOMETASONE FURO-FORMOTEROL FUM 100-5 MCG/ACT IN AERO
2.0000 | INHALATION_SPRAY | Freq: Two times a day (BID) | RESPIRATORY_TRACT | Status: DC
  Administered 2012-07-26 – 2012-07-28 (×4): 2 via RESPIRATORY_TRACT
  Filled 2012-07-26: qty 8.8

## 2012-07-26 MED ORDER — BISACODYL 10 MG RE SUPP: 10.0000 mg | Freq: Every day | RECTAL | Status: DC | PRN

## 2012-07-26 MED ORDER — LEVOFLOXACIN 500 MG PO TABS: 500.0000 mg | ORAL_TABLET | Freq: Once | ORAL | Status: DC

## 2012-07-26 MED ORDER — DEXTROSE 5 % IV SOLN
1.0000 g | Freq: Three times a day (TID) | INTRAVENOUS | Status: DC
  Administered 2012-07-26 – 2012-07-27 (×3): 1 g via INTRAVENOUS
  Filled 2012-07-26 (×5): qty 1

## 2012-07-26 MED ORDER — OXYCODONE-ACETAMINOPHEN 5-325 MG PO TABS
1.0000 | ORAL_TABLET | ORAL | Status: DC | PRN
  Administered 2012-07-27 – 2012-07-28 (×4): 1 via ORAL
  Filled 2012-07-26 (×4): qty 1

## 2012-07-26 MED ORDER — TIOTROPIUM BROMIDE MONOHYDRATE 18 MCG IN CAPS: 18.0000 ug | ORAL_CAPSULE | Freq: Every day | RESPIRATORY_TRACT | Status: DC

## 2012-07-26 MED ORDER — SODIUM CHLORIDE 0.9 % IJ SOLN
3.0000 mL | Freq: Two times a day (BID) | INTRAMUSCULAR | Status: DC
  Administered 2012-07-26 – 2012-07-28 (×3): 3 mL via INTRAVENOUS

## 2012-07-26 MED ORDER — IPRATROPIUM BROMIDE 0.02 % IN SOLN
0.5000 mg | Freq: Once | RESPIRATORY_TRACT | Status: AC
  Administered 2012-07-26: 0.5 mg via RESPIRATORY_TRACT
  Filled 2012-07-26: qty 2.5

## 2012-07-26 MED ORDER — FENOFIBRATE 160 MG PO TABS
160.0000 mg | ORAL_TABLET | Freq: Every day | ORAL | Status: DC
  Filled 2012-07-26: qty 1

## 2012-07-26 MED ORDER — TIOTROPIUM BROMIDE MONOHYDRATE 18 MCG IN CAPS
18.0000 ug | ORAL_CAPSULE | Freq: Every day | RESPIRATORY_TRACT | Status: DC
  Administered 2012-07-27 – 2012-07-28 (×2): 18 ug via RESPIRATORY_TRACT
  Filled 2012-07-26: qty 5

## 2012-07-26 MED ORDER — SODIUM CHLORIDE 0.9 % IJ SOLN
3.0000 mL | Freq: Two times a day (BID) | INTRAMUSCULAR | Status: DC
  Administered 2012-07-27: 3 mL via INTRAVENOUS

## 2012-07-26 MED ORDER — BISOPROLOL FUMARATE 5 MG PO TABS
2.5000 mg | ORAL_TABLET | Freq: Every day | ORAL | Status: DC
  Administered 2012-07-26 – 2012-07-28 (×3): 2.5 mg via ORAL
  Filled 2012-07-26 (×3): qty 0.5

## 2012-07-26 MED ORDER — VANCOMYCIN HCL 500 MG IV SOLR
500.0000 mg | Freq: Two times a day (BID) | INTRAVENOUS | Status: DC
  Administered 2012-07-27: 500 mg via INTRAVENOUS
  Filled 2012-07-26 (×3): qty 500

## 2012-07-26 MED ORDER — OXYCODONE HCL 5 MG PO TABS
5.0000 mg | ORAL_TABLET | ORAL | Status: DC | PRN
  Administered 2012-07-27 – 2012-07-28 (×2): 5 mg via ORAL
  Filled 2012-07-26 (×2): qty 1

## 2012-07-26 MED ORDER — POTASSIUM CHLORIDE CRYS ER 20 MEQ PO TBCR
40.0000 meq | EXTENDED_RELEASE_TABLET | ORAL | Status: AC
  Administered 2012-07-26 – 2012-07-27 (×3): 40 meq via ORAL
  Filled 2012-07-26 (×4): qty 2

## 2012-07-26 MED ORDER — OXYCODONE-ACETAMINOPHEN 10-325 MG PO TABS: 1.0000 | ORAL_TABLET | ORAL | Status: DC | PRN

## 2012-07-26 MED ORDER — RIVAROXABAN 20 MG PO TABS
20.0000 mg | ORAL_TABLET | Freq: Every day | ORAL | Status: DC
  Administered 2012-07-26 – 2012-07-28 (×3): 20 mg via ORAL
  Filled 2012-07-26 (×3): qty 1

## 2012-07-26 MED ORDER — MONTELUKAST SODIUM 10 MG PO TABS
10.0000 mg | ORAL_TABLET | Freq: Every day | ORAL | Status: DC
  Administered 2012-07-26 – 2012-07-28 (×3): 10 mg via ORAL
  Filled 2012-07-26 (×3): qty 1

## 2012-07-26 MED ORDER — ALBUTEROL SULFATE (5 MG/ML) 0.5% IN NEBU
5.0000 mg | INHALATION_SOLUTION | Freq: Once | RESPIRATORY_TRACT | Status: AC
  Administered 2012-07-26: 5 mg via RESPIRATORY_TRACT
  Filled 2012-07-26: qty 1

## 2012-07-26 MED ORDER — NITROGLYCERIN 0.4 MG SL SUBL: 0.4000 mg | SUBLINGUAL_TABLET | SUBLINGUAL | Status: DC | PRN

## 2012-07-26 MED ORDER — METHYLPREDNISOLONE SODIUM SUCC 40 MG IJ SOLR
40.0000 mg | Freq: Two times a day (BID) | INTRAMUSCULAR | Status: DC
  Administered 2012-07-26 – 2012-07-27 (×2): 40 mg via INTRAVENOUS
  Filled 2012-07-26 (×4): qty 1

## 2012-07-26 MED ORDER — SODIUM CHLORIDE 0.9 % IJ SOLN: 3.0000 mL | INTRAMUSCULAR | Status: DC | PRN

## 2012-07-26 MED ORDER — ALPRAZOLAM 0.5 MG PO TABS
0.5000 mg | ORAL_TABLET | Freq: Three times a day (TID) | ORAL | Status: DC | PRN
  Administered 2012-07-27 – 2012-07-28 (×4): 0.5 mg via ORAL
  Filled 2012-07-26 (×4): qty 1

## 2012-07-26 MED ORDER — PANTOPRAZOLE SODIUM 40 MG PO TBEC
40.0000 mg | DELAYED_RELEASE_TABLET | Freq: Every day | ORAL | Status: DC
  Administered 2012-07-26 – 2012-07-28 (×3): 40 mg via ORAL
  Filled 2012-07-26 (×3): qty 1

## 2012-07-26 MED ORDER — FUROSEMIDE 10 MG/ML IJ SOLN
40.0000 mg | Freq: Two times a day (BID) | INTRAMUSCULAR | Status: DC
  Administered 2012-07-26 – 2012-07-28 (×4): 40 mg via INTRAVENOUS
  Filled 2012-07-26 (×6): qty 4

## 2012-07-26 MED ORDER — CITALOPRAM HYDROBROMIDE 20 MG PO TABS
20.0000 mg | ORAL_TABLET | Freq: Every day | ORAL | Status: DC
  Administered 2012-07-26 – 2012-07-28 (×3): 20 mg via ORAL
  Filled 2012-07-26 (×3): qty 1

## 2012-07-26 MED ORDER — VANCOMYCIN HCL 500 MG IV SOLR
500.0000 mg | Freq: Two times a day (BID) | INTRAVENOUS | Status: DC
  Filled 2012-07-26 (×2): qty 500

## 2012-07-26 MED ORDER — ACETAMINOPHEN 325 MG PO TABS: 650.0000 mg | ORAL_TABLET | Freq: Four times a day (QID) | ORAL | Status: DC | PRN

## 2012-07-26 NOTE — Telephone Encounter (Signed)
Called patient back and she stated that she was very cold and thought she had a fever. Nurse advised patient that she needed to go to the ED for evaluation now. Patient verbalized understanding of plan.

## 2012-07-26 NOTE — ED Provider Notes (Signed)
History     CSN: [id number]  Arrival date & time 07/26/12  1542   First MD Initiated Contact with Patient 07/26/12 1623      Chief Complaint  Patient presents with  . Shortness of Breath     The history is provided by the patient.   patient reports worsening shortness of breath over the past 7-9 days.  She has a history of COPD and congestive heart failure.  She's had fever chills and headache at home.  She reports productive cough.  She had fever to 103 today.  She's tried her breathing treatments at home without improvement in her symptoms.  She has a history of COPD that is oxygen dependent.  She's on 3 L of home oxygen.  Her heart rate was 113 on arrival of emergency department.  She can speak in full sentences and has no difficulty carrying on a conversation.  She denies chest pain or abdominal pain.  No nausea vomiting or diarrhea.  No melena hematochezia.  Her symptoms are mild to moderate in severity.  Past Medical History  Diagnosis Date  . Chronic combined systolic and diastolic CHF (congestive heart failure)     a. 11/2011 Echo: EF 40-45% w/ Gr 1 DD.  Marland Kitchen Stroke   . Deep vein thrombosis     a. Chronic Xarelto.  . Hyperlipidemia   . Sleep apnea   . 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 Coronary artery disease     a. reportedly s/p MI x 2;  b. 2012 Myoview: EF 50% with a large lateral defect consistent with prior myocardial infarction-> Med Rx.  . Chronic pain     due to prior hip fracture and DDD back  . Shortness of breath     Past Surgical History  Procedure Laterality Date  . Appendectomy    . Back surgery    . Cholecystectomy    . Coronary stent placement    . Breast surgery      benign tumors  . Carpal tunnel release    . Back surgery    . Right hip replacement      Family History  Problem Relation Age of Onset  . Hypertension Mother   . Breast cancer Mother   . Rheum arthritis  Maternal Grandmother   . Cancer Sister     History  Substance Use Topics  . Smoking status: Current Every Day Smoker -- 0.50 packs/day for 40 years    Types: Cigarettes  . Smokeless tobacco: Not on file     Comment: started smoking in 1969  . Alcohol Use: No    OB History   Grav Para Term Preterm Abortions TAB SAB Ect Mult Living                  Review of Systems  All other systems reviewed and are negative.    Allergies  Penicillins  Home Medications   Current Outpatient Rx  Name  Route  Sig  Dispense  Refill  . albuterol (PROVENTIL) (2.5 MG/3ML) 0.083% nebulizer solution   Nebulization   Take 2.5 mg by nebulization every 6 (six) hours as needed for wheezing.         Marland Kitchen ALPRAZolam (XANAX) 0.5 MG tablet   Oral   Take 0.5 mg by mouth 4 (four) times daily as needed for anxiety.         Marland Kitchen atorvastatin (LIPITOR)  40 MG tablet   Oral   Take 40 mg by mouth daily.         . bisoprolol (ZEBETA) 5 MG tablet   Oral   Take 2.5 mg by mouth daily.         . citalopram (CELEXA) 20 MG tablet   Oral   Take 20 mg by mouth daily.         . fenofibrate 160 MG tablet   Oral   Take 160 mg by mouth daily.         . Fluticasone-Salmeterol (ADVAIR) 250-50 MCG/DOSE AEPB   Inhalation   Inhale 1 puff into the lungs every 12 (twelve) hours.         . folic acid (FOLVITE) 1 MG tablet   Oral   Take 1 mg by mouth daily.         . furosemide (LASIX) 80 MG tablet   Oral   Take 40 mg by mouth 2 (two) times daily.         Marland Kitchen gabapentin (NEURONTIN) 300 MG capsule   Oral   Take 300 mg by mouth 5 (five) times daily.          Marland Kitchen ipratropium-albuterol (DUONEB) 0.5-2.5 (3) MG/3ML SOLN   Nebulization   Take 3 mLs by nebulization 3 (three) times daily. For shortness of breath or wheezing   360 mL   3   . isosorbide mononitrate (IMDUR) 30 MG 24 hr tablet   Oral   Take 30 mg by mouth daily.         . montelukast (SINGULAIR) 10 MG tablet   Oral   Take 10 mg by  mouth daily.         . nitroGLYCERIN (NITROSTAT) 0.4 MG SL tablet   Sublingual   Place 0.4 mg under the tongue every 5 (five) minutes as needed. For chest pain         . omeprazole (PRILOSEC) 20 MG capsule   Oral   Take 20 mg by mouth daily.         . ondansetron (ZOFRAN) 4 MG tablet   Oral   Take 4 mg by mouth every 6 (six) hours as needed for nausea.         Marland Kitchen oxyCODONE-acetaminophen (PERCOCET) 10-325 MG per tablet   Oral   Take 1 tablet by mouth every 4 (four) hours as needed for pain. For pain         . Rivaroxaban (XARELTO) 20 MG TABS   Oral   Take 20 mg by mouth daily.         Marland Kitchen tiotropium (SPIRIVA) 18 MCG inhalation capsule   Inhalation   Place 18 mcg into inhaler and inhale daily.           BP 101/58  Pulse 107  Temp(Src) 100.4 F (38 C) (Oral)  Resp 32  Ht 5\' 3"  (1.6 m)  Wt 128 lb (58.06 kg)  BMI 22.68 kg/m2  SpO2 94%  LMP 07/08/1972  Physical Exam  Nursing note and vitals reviewed. Constitutional: She is oriented to person, place, and time. She appears well-developed and well-nourished. No distress.  HENT:  Head: Normocephalic and atraumatic.  Eyes: EOM are normal.  Neck: Normal range of motion.  Cardiovascular: Regular rhythm and normal heart sounds.   Tachycardia  Pulmonary/Chest: Effort normal and breath sounds normal.  Abdominal: Soft. She exhibits no distension. There is no tenderness.  Musculoskeletal: Normal range of motion.  Neurological: She is  alert and oriented to person, place, and time.  Skin: Skin is warm and dry.  Psychiatric: She has a normal mood and affect. Judgment normal.    ED Course  Procedures (including critical care time)   Date: 07/26/2012  Rate: 113  Rhythm: sinus tachycardia  QRS Axis: normal  Intervals: normal  ST/T Wave abnormalities: normal  Conduction Disutrbances: none  Narrative Interpretation:   Old EKG Reviewed: No significant changes noted     Labs Reviewed  CBC - Abnormal; Notable  for the following:    WBC 16.0 (*)    RBC 3.35 (*)    Hemoglobin 10.3 (*)    HCT 29.8 (*)    All other components within normal limits  COMPREHENSIVE METABOLIC PANEL - Abnormal; Notable for the following:    Sodium 132 (*)    Potassium 2.8 (*)    Chloride 91 (*)    CO2 33 (*)    Calcium 8.3 (*)    Total Protein 5.1 (*)    Albumin 2.2 (*)    GFR calc non Af Amer 73 (*)    GFR calc Af Amer 85 (*)    All other components within normal limits  PRO B NATRIURETIC PEPTIDE - Abnormal; Notable for the following:    Pro B Natriuretic peptide (BNP) 1873.0 (*)    All other components within normal limits  CULTURE, BLOOD (ROUTINE X 2)  CULTURE, BLOOD (ROUTINE X 2)  URINALYSIS, ROUTINE W REFLEX MICROSCOPIC  LACTIC ACID, PLASMA  POCT I-STAT TROPONIN I   Dg Chest 2 View  07/26/2012  *RADIOLOGY REPORT*  Clinical Data: Shortness of breath  CHEST - 2 VIEW  Comparison: 06/22/2012  Findings: Again noted multilevel vertebroplasty thoracic spine. There is hazy airspace disease left perihilar and left lower lobe highly suspicious for infiltrate/pneumonia.  Follow-up to resolution after treatment is recommended.  No pulmonary edema.  IMPRESSION: Hazy airspace disease left perihilar and left lower lobe highly suspicious for infiltrate/pneumonia.  Follow-up to resolution after treatment is recommended.   Original Report Authenticated By: Natasha Mead, M.D.    I personally reviewed the imaging tests through PACS system I reviewed available ER/hospitalization records through the EMR   1. HCAP (healthcare-associated pneumonia)   2. COPD exacerbation       MDM  Patient presents with a fever.a 1.3 likely healthcare associated pneumonia on chest x-ray.  Her breathing is improved after initial breathing treatment.  She'll get another dose of albuterol and Atrovent at this time.  Prednisone for what appears to be a COPD exacerbation.  Admit to the hospital.  Lactate and blood cultures pending.  Patient is  nontoxic-appearing        Lyanne Co, MD 07/26/12 [phone]

## 2012-07-26 NOTE — H&P (Addendum)
[facility] History and Physical  DIANELLY FERRAN EAV:[id number] DOB: [birth year] DOA: 07/26/2012  Referring physician: Dr. Patria Mane PCP: Rudi Heap, MD  Specialists:   Chief Complaint: Cough and fever  HPI: Aimee Sanders is a 67 y.o. female with multiple medical problems as listed below including chronic combined systolic and diastolic CHF last EF 40-45% in 2013 R., COPD, CAD status post MI in the past last hospitalized in March with unstable angina and presents with above complaints. She states that for the past one week she has had a cough, and began having the chills as well the past 5 days with a subjective fevers. She also has had worsening of shortness of breath.She admits to a constant chest pain like a heaviness-midsternal, and has been present over the past one week. She reports the chest pain is nonexertional and not worsened by coughing. She also admits to PND and worsening of her leg swelling. She was seen in the ER and a chest x-ray revealed left perihilar and left lower lobe opacities suspicious for pneumonia. Her BNP was 1873(just actually improved from about 3000 in March) in the ED she was started on empiric antibiotics for HCAP and is admitted for further evaluation and management.   Review of Systems: The patient denies anorexia, weight loss,, vision loss, decreased hearing, syncope, balance deficits, hemoptysis, abdominal pain, melena, hematochezia, severe indigestion/heartburn, hematuria, transient blindness, difficulty walking, depression, abnormal bleeding, enlarged lymph nodes, angioedema, and breast     Past Medical History  Diagnosis Date  . Chronic combined systolic and diastolic CHF (congestive heart failure)     a. 11/2011 Echo: EF 40-45% w/ Gr 1 DD.  Marland Kitchen Stroke   . Deep vein thrombosis     a. Chronic Xarelto.  . Hyperlipidemia   . Sleep apnea   . 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  Coronary artery disease     a. reportedly s/p MI x 2;  b. 2012 Myoview: EF 50% with a large lateral defect consistent with prior myocardial infarction-> Med Rx.  . Chronic pain     due to prior hip fracture and DDD back  . Shortness of breath    Past Surgical History  Procedure Laterality Date  . Appendectomy    . Back surgery    . Cholecystectomy    . Coronary stent placement    . Breast surgery      benign tumors  . Carpal tunnel release    . Back surgery    . Right hip replacement     Social History:  reports that she has been smoking Cigarettes.  She has a 20 pack-year smoking history. She does not have any smokeless tobacco history on file. She reports that she does not drink alcohol or use illicit drugs. where does patient live--home   Allergies  Allergen Reactions  . Penicillins Hives and Itching    Family History  Problem Relation Age of Onset  . Hypertension Mother   . Breast cancer Mother   . Rheum arthritis Maternal Grandmother   . Cancer Sister    Prior to Admission medications   Medication Sig Start Date End Date Taking? Authorizing Provider  albuterol (PROVENTIL) (2.5 MG/3ML) 0.083% nebulizer solution Take 2.5 mg by nebulization every 6 (six) hours as needed for wheezing.   Yes Historical Provider, MD  ALPRAZolam Prudy Feeler) 0.5 MG tablet Take 0.5 mg by mouth 4 (four) times daily  as needed for anxiety. 07/07/12  Yes Mary-Margaret Daphine Deutscher, FNP  atorvastatin (LIPITOR) 40 MG tablet Take 40 mg by mouth daily.   Yes Historical Provider, MD  bisoprolol (ZEBETA) 5 MG tablet Take 2.5 mg by mouth daily. 07/14/12  Yes Prescott Parma, PA-C  citalopram (CELEXA) 20 MG tablet Take 20 mg by mouth daily. 07/06/12  Yes Mary-Margaret Daphine Deutscher, FNP  fenofibrate 160 MG tablet Take 160 mg by mouth daily. 07/08/12  Yes Mary-Margaret Daphine Deutscher, FNP  Fluticasone-Salmeterol (ADVAIR) 250-50 MCG/DOSE AEPB Inhale 1 puff into the lungs every 12 (twelve) hours. 07/20/12  Yes Mary-Margaret Daphine Deutscher, FNP  folic  acid (FOLVITE) 1 MG tablet Take 1 mg by mouth daily.   Yes Historical Provider, MD  furosemide (LASIX) 80 MG tablet Take 40 mg by mouth 2 (two) times daily. 07/18/12  Yes Mary-Margaret Daphine Deutscher, FNP  gabapentin (NEURONTIN) 300 MG capsule Take 300 mg by mouth 5 (five) times daily.  06/15/12  Yes Historical Provider, MD  ipratropium-albuterol (DUONEB) 0.5-2.5 (3) MG/3ML SOLN Take 3 mLs by nebulization 3 (three) times daily. For shortness of breath or wheezing 02/11/12  Yes Ripudeep K Rai, MD  isosorbide mononitrate (IMDUR) 30 MG 24 hr tablet Take 30 mg by mouth daily. 07/06/12  Yes Mary-Margaret Daphine Deutscher, FNP  montelukast (SINGULAIR) 10 MG tablet Take 10 mg by mouth daily. 07/06/12  Yes Mary-Margaret Daphine Deutscher, FNP  nitroGLYCERIN (NITROSTAT) 0.4 MG SL tablet Place 0.4 mg under the tongue every 5 (five) minutes as needed. For chest pain   Yes Historical Provider, MD  omeprazole (PRILOSEC) 20 MG capsule Take 20 mg by mouth daily.   Yes Historical Provider, MD  ondansetron (ZOFRAN) 4 MG tablet Take 4 mg by mouth every 6 (six) hours as needed for nausea. 06/04/12  Yes Nicoletta Dress. Colon Branch, MD  oxyCODONE-acetaminophen (PERCOCET) 10-325 MG per tablet Take 1 tablet by mouth every 4 (four) hours as needed for pain. For pain 07/07/12  Yes Mary-Margaret Daphine Deutscher, FNP  Rivaroxaban (XARELTO) 20 MG TABS Take 20 mg by mouth daily.   Yes Historical Provider, MD  tiotropium (SPIRIVA) 18 MCG inhalation capsule Place 18 mcg into inhaler and inhale daily. 07/06/12  Yes Mary-Margaret Daphine Deutscher, FNP   Physical Exam: Filed Vitals:   07/26/12 1719 07/26/12 1730 07/26/12 1745 07/26/12 1840  BP:  101/58 103/83 85/62  Pulse:  107 108 112  Temp: 100.4 F (38 C)     TempSrc: Oral     Resp:  32 25 28  Height:      Weight:      SpO2:  94% 93% 93%    Constitutional: Vital signs reviewed.  Patient is an elderly female, well-developed and well-nourished  in no acute distress and cooperative with exam. Alert and oriented x3.  Head: Normocephalic  and atraumatic Mouth: no erythema or exudates, MMM Eyes: PERRL, EOMI, conjunctivae normal, No scleral icterus.  Neck: Supple, Trachea midline normal ROM, No JVD, mass, thyromegaly, or carotid bruit present.  Cardiovascular: RRR, S1 normal, S2 normal, no MRG, pulses symmetric and intact bilaterally Pulmonary/Chest: Expiratory wheezes bilaterally, left greater than right, crakles at bases Abdominal: Soft. Non-tender, non-distended, bowel sounds are normal, no masses, organomegaly, or guarding present.  GU: no CVA tenderness Extremities: +3 edema, no cyanosis  Neurological: A&O x3, Strength is normal and symmetric bilaterally, cranial nerve II-XII are grossly intact, no focal motor deficit, sensory intact to light touch bilaterally.  Skin: Warm, dry and intact. No rash,.  Psychiatric: Normal mood and affect. speech and behavior is normal.  Labs on Admission:  Basic Metabolic Panel:  Recent Labs Lab 07/26/12 1616  NA 132*  K 2.8*  CL 91*  CO2 33*  GLUCOSE 94  BUN 13  CREATININE 0.81  CALCIUM 8.3*   Liver Function Tests:  Recent Labs Lab 07/26/12 1616  AST 35  ALT 12  ALKPHOS 65  BILITOT 0.4  PROT 5.1*  ALBUMIN 2.2*   No results found for this basename: LIPASE, AMYLASE,  in the last 168 hours No results found for this basename: AMMONIA,  in the last 168 hours CBC:  Recent Labs Lab 07/26/12 1616  WBC 16.0*  HGB 10.3*  HCT 29.8*  MCV 89.0  PLT 202   Cardiac Enzymes: No results found for this basename: CKTOTAL, CKMB, CKMBINDEX, TROPONINI,  in the last 168 hours  BNP (last 3 results)  Recent Labs  02/08/12 1121 06/22/12 2005 07/26/12 1616  PROBNP 1958.0* 3562.0* 1873.0*   CBG: No results found for this basename: GLUCAP,  in the last 168 hours  Radiological Exams on Admission: Dg Chest 2 View  07/26/2012  *RADIOLOGY REPORT*  Clinical Data: Shortness of breath  CHEST - 2 VIEW  Comparison: 06/22/2012  Findings: Again noted multilevel vertebroplasty thoracic  spine. There is hazy airspace disease left perihilar and left lower lobe highly suspicious for infiltrate/pneumonia.  Follow-up to resolution after treatment is recommended.  No pulmonary edema.  IMPRESSION: Hazy airspace disease left perihilar and left lower lobe highly suspicious for infiltrate/pneumonia.  Follow-up to resolution after treatment is recommended.   Original Report Authenticated By: Natasha Mead, M.D.       Assessment/Plan Active Problems:   HCAP (healthcare-associated pneumonia) -As discussed above, obtain cultures empiric antibiotics with cefepime and vancomycin  And follow.   COPD (chronic obstructive pulmonary disease)exacerbation -Will place on bronchodilators, also add Solu-Medrol and antibiotics as above -Continue supplemental O2  acute on Chronic combined systolic and diastolic CHF (congestive heart failure) -Will diurese with IV Lasix,  And follow -Likely precipitated by number -Continue outpatient medication. -Cycle cardiac enzymes given her chest pain as discussed above, follow and consult cardiology pending results.   RESPIRATORY FAILURE, Acute on CHRONIC -Secondary to above, treatment as above.    Hyponatremia -Likely secondary to CHF, follow and recheck with diuresis.   Hypokalemia -Replace potassium History of DVT -Continue xarelto SLEEP APNEA     Code Status: FULL  Family Communication:with pt at bedside, no family present Disposition Plan: admit to tele  Time spent: >30MINS  Kela Millin [facility] Pager [phone]  If 7PM-7AM, please contact night-coverage [email or web address] Password [facility] 07/26/2012, 7:17 PM

## 2012-07-26 NOTE — Consult Note (Signed)
ANTIBIOTIC CONSULT NOTE - INITIAL  Pharmacy Consult for Vancomycin, Cefepime Indication: HCAP  Allergies: Allergies  Allergen Reactions  . Penicillins Hives and Itching, Reaction noted by MD.    Patient Measurements: Height: 5\' 3"  (160 cm) Weight: 128 lb (58.06 kg) IBW/kg (Calculated) : 52.4  Vital Signs: BP 101/58  Pulse 107  Temp(Src) 100.4 F (38 C) (Oral)  Resp 32  Ht 5\' 3"  (1.6 m)  Wt 128 lb (58.06 kg)  BMI 22.68 kg/m2  SpO2 94%  LMP 07/08/1972  Labs:  Recent Labs  07/26/12 1616  WBC 16.0*  HGB 10.3*  PLT 202  CREATININE 0.81   Estimated Creatinine Clearance: 55.8 ml/min (by C-G formula based on Cr of 0.81).   Microbiology: No results found for this or any previous visit (from the past 720 hour(s)).  Medical/Surgical History: Past Medical History  Diagnosis Date  . Chronic combined systolic and diastolic CHF (congestive heart failure)     a. 11/2011 Echo: EF 40-45% w/ Gr 1 DD.  Marland Kitchen Stroke   . Deep vein thrombosis     a. Chronic Xarelto.  . Hyperlipidemia   . Sleep apnea   . 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 Coronary artery disease     a. reportedly s/p MI x 2;  b. 2012 Myoview: EF 50% with a large lateral defect consistent with prior myocardial infarction-> Med Rx.  . Chronic pain     due to prior hip fracture and DDD back  . Shortness of breath    Past Surgical History  Procedure Laterality Date  . Appendectomy    . Back surgery    . Cholecystectomy    . Coronary stent placement    . Breast surgery      benign tumors  . Carpal tunnel release    . Back surgery    . Right hip replacement      Medications:  No current facility-administered medications on file prior to encounter.   Current Outpatient Prescriptions on File Prior to Encounter  Medication Sig Dispense Refill  . atorvastatin (LIPITOR) 40 MG tablet Take 40 mg by mouth daily.      . folic acid (FOLVITE) 1  MG tablet Take 1 mg by mouth daily.      Marland Kitchen gabapentin (NEURONTIN) 300 MG capsule Take 300 mg by mouth 5 (five) times daily.       Marland Kitchen ipratropium-albuterol (DUONEB) 0.5-2.5 (3) MG/3ML SOLN Take 3 mLs by nebulization 3 (three) times daily. For shortness of breath or wheezing  360 mL  3  . nitroGLYCERIN (NITROSTAT) 0.4 MG SL tablet Place 0.4 mg under the tongue every 5 (five) minutes as needed. For chest pain      . Rivaroxaban (XARELTO) 20 MG TABS Take 20 mg by mouth daily.      . [DISCONTINUED] bisoprolol (ZEBETA) 5 MG tablet Take 0.5 tablets (2.5 mg total) by mouth daily.  15 tablet  6  . [DISCONTINUED] citalopram (CELEXA) 20 MG tablet Take 1 tablet (20 mg total) by mouth daily.  30 tablet  1  . [DISCONTINUED] fenofibrate 160 MG tablet Take 1 tablet (160 mg total) by mouth daily.  30 tablet  3  . [DISCONTINUED] tiotropium (SPIRIVA) 18 MCG inhalation capsule Place 1 capsule (18 mcg total) into inhaler and inhale daily.  30 capsule  4    Scheduled:  . [COMPLETED] albuterol  5 mg Nebulization Once  . [  COMPLETED] ipratropium  0.5 mg Nebulization Once  . [COMPLETED] potassium chloride  40 mEq Oral Once  . [COMPLETED] predniSONE  60 mg Oral Once  . [DISCONTINUED] levofloxacin  500 mg Oral Once     Assessment:  67 y/o female admitted with fever and worsening shortness of breath.  CXR suspicious for PNA.  Pharmacy consulted for antibiotic therapy > Cefepime and Vancomycin.  MD aware of PCN allergy and wants to continue Cefepime with close observation.  Goal of Therapy:   Vancomycin trough level 15-20 mcg/ml  Plan:   Cefepime 1 gm IV q 8 hours  Vancomycin 500 mg IV q 12 hours.  Measure Vancomycin levels at steady state, follow renal function.  Follow up culture results  Laurena Bering,  Pharm.D.,    4/8/20146:23 PM

## 2012-07-26 NOTE — ED Notes (Signed)
Pt reports to the ED via [facility]. Pt reports dyspnea at rest since 4/03. That has worsened in the past 2 days. Pt also reports productive yellow cough, fever, headache, and chills. Pt has hx of COPD and CHF. Pt was recently d/c on 3/25 for exacerbation of her CHF. Pts family reports she had 103 fever and received 1 separate doses of 500 mg of Tylenol. Pt also tried her albuterol nebulizer at home with no relief. Pt had 5 mg of Albuterol and 1 mg of Atrovent total en route. Pt is A&O x4. Pt tachynpnic and tachycardic in the 110s. Pt also febrile. Pt can speak in full sentences. Also complains of sore throat.

## 2012-07-26 NOTE — Telephone Encounter (Signed)
Spoke with patient and [facility] nurse, and patient c/o of having chest pain last night rated a 10 on a scale of 1-10 ten being the greatest. Patient took nitroglycerin x's 1 and felt relief. Patient say's she still fell pressure in her chest and its rated 4-5. Patient also c/o of BLE that is worse than it was when she was here before. Patient sob that is worse than it was at her last visit. Patient and [facility] nurse requesting that patient be seen in office today. Nurse advised patient that request would be sent to MD. Patient and Purnell Shoemaker also advised that patient would need to go to ED for evaluation if MD couldn't see her today. Patient and Purnell Shoemaker verbalized understanding of plan.

## 2012-07-26 NOTE — Progress Notes (Addendum)
RT gave patient a sputum sample cup to keep at bedside for when she coughs up any secretions.  Explained that if she was to cough anything up to let her RN know so that it could be sent to lab.

## 2012-07-27 ENCOUNTER — Other Ambulatory Visit: Payer: Self-pay | Admitting: *Deleted

## 2012-07-27 DIAGNOSIS — J441 Chronic obstructive pulmonary disease with (acute) exacerbation: Secondary | ICD-10-CM

## 2012-07-27 LAB — CBC
MCH: 30.6 pg (ref 26.0–34.0)
MCV: 88.9 fL (ref 78.0–100.0)
Platelets: 212 10*3/uL (ref 150–400)
RDW: 15 % (ref 11.5–15.5)
WBC: 19 10*3/uL — ABNORMAL HIGH (ref 4.0–10.5)

## 2012-07-27 LAB — BASIC METABOLIC PANEL
Calcium: 8.6 mg/dL (ref 8.4–10.5)
Chloride: 98 mEq/L (ref 96–112)
Creatinine, Ser: 0.77 mg/dL (ref 0.50–1.10)
GFR calc Af Amer: >90 mL/min (ref >90)

## 2012-07-27 LAB — STREP PNEUMONIAE URINARY ANTIGEN: Strep Pneumo Urinary Antigen: POSITIVE — AB

## 2012-07-27 MED ORDER — DEXTROSE 5 % IV SOLN
1.0000 g | INTRAVENOUS | Status: DC
  Filled 2012-07-27: qty 10

## 2012-07-27 MED ORDER — LEVOFLOXACIN IN D5W 750 MG/150ML IV SOLN
750.0000 mg | INTRAVENOUS | Status: DC
  Administered 2012-07-27: 750 mg via INTRAVENOUS
  Filled 2012-07-27 (×2): qty 150

## 2012-07-27 MED ORDER — IPRATROPIUM-ALBUTEROL 0.5-2.5 (3) MG/3ML IN SOLN: 3.0000 mL | Freq: Three times a day (TID) | RESPIRATORY_TRACT | Status: AC

## 2012-07-27 MED ORDER — NICOTINE 21 MG/24HR TD PT24
21.0000 mg | MEDICATED_PATCH | Freq: Every day | TRANSDERMAL | Status: DC
  Administered 2012-07-27 – 2012-07-28 (×2): 21 mg via TRANSDERMAL
  Filled 2012-07-27 (×2): qty 1

## 2012-07-27 MED ORDER — LEVALBUTEROL HCL 0.63 MG/3ML IN NEBU
0.6300 mg | INHALATION_SOLUTION | Freq: Three times a day (TID) | RESPIRATORY_TRACT | Status: DC
  Administered 2012-07-27 – 2012-07-28 (×5): 0.63 mg via RESPIRATORY_TRACT
  Filled 2012-07-27 (×9): qty 3

## 2012-07-27 NOTE — Care Management Note (Signed)
    Page 1 of 1   07/27/2012     3:34:30 PM   CARE MANAGEMENT NOTE 07/27/2012  Patient:  ODYSSEY, VASBINDER   Account Number:  [id number]  Date Initiated:  07/27/2012  Documentation initiated by:  Oletta Cohn  Subjective/Objective Assessment:   67 yo female PCP: Rudi Heap, MD; Chief Complaint: Cough and fever     Action/Plan:   home with children and HHRN [facility])   Anticipated DC Date:  07/30/2012   Anticipated DC Plan:  HOME W HOME HEALTH SERVICES      DC Planning Services  CM consult      [facility] Choice  HOME HEALTH   Choice offered to / List presented to:             [facility] agency  [facility]   Status of service:   Medicare Important Message given?   (If response is "NO", the following Medicare IM given date fields will be blank) Date Medicare IM given:   Date Additional Medicare IM given:    Discharge Disposition:    Per UR Regulation:  Reviewed for med. necessity/level of care/duration of stay  If discussed at Long Length of Stay Meetings, dates discussed:    Comments:  07/27/12.Marland KitchenMarland KitchenOletta Cohn, RN,BSN, [place]  [phone] Pt currently had HHRN through [facility].  Will ask MD to resume order on discharge.  CM to follow up with any DME needs.

## 2012-07-27 NOTE — Progress Notes (Signed)
[facility]             Progress Note   Subjective: No complaints, shortness of breath improved. Feels better already.  Objective: Vital signs in last 24 hours: Temp:  [98 F (36.7 C)-101.3 F (38.5 C)] 98 F (36.7 C) (04/09 1430) Pulse Rate:  [78-113] 78 (04/09 1430) Resp:  [20-34] 20 (04/09 1430) BP: (80-154)/(42-99) 94/59 mmHg (04/09 1430) SpO2:  [90 %-94 %] 93 % (04/09 1430) Weight:  [58.06 kg (128 lb)-60.7 kg (133 lb 13.1 oz)] 60.419 kg (133 lb 3.2 oz) (04/09 0650) Weight change:  Last BM Date: 07/27/12 (Lg brown formed BM )  Intake/Output from previous day: 04/08 0701 - 04/09 0700 In: 660 [P.O.:360; IV Piggyback:300] Out: 1050 [Urine:1050] Total I/O In: 240 [P.O.:240] Out: 1402 [Urine:1400; Stool:2]   Physical Exam: General: Alert, awake, oriented x3, in no acute distress. HEENT: No bruits, no goiter. Heart: Regular rate and rhythm, without murmurs, rubs, gallops. Lungs: Bilateral rhonchi. Abdomen: Soft, nontender, nondistended, positive bowel sounds. Extremities: No clubbing cyanosis or edema with positive pedal pulses. Neuro: Grossly intact, nonfocal.    Lab Results: Basic Metabolic Panel:  Recent Labs  13/08/65 1616 07/27/12 0808  NA 132* 136  K 2.8* 4.4  CL 91* 98  CO2 33* 32  GLUCOSE 94 110*  BUN 13 14  CREATININE 0.81 0.77  CALCIUM 8.3* 8.6   Liver Function Tests:  Recent Labs  07/26/12 1616  AST 35  ALT 12  ALKPHOS 65  BILITOT 0.4  PROT 5.1*  ALBUMIN 2.2*   CBC:  Recent Labs  07/26/12 1616 07/27/12 0808  WBC 16.0* 19.0*  HGB 10.3* 9.4*  HCT 29.8* 27.3*  MCV 89.0 88.9  PLT 202 212   Cardiac Enzymes:  Recent Labs  07/26/12 1942 07/27/12 0139 07/27/12 0808  TROPONINI <0.30 <0.30 <0.30   BNP:  Recent Labs  07/26/12 1616  PROBNP 1873.0*   Urinalysis:  Recent Labs  07/26/12 1827  COLORURINE YELLOW  LABSPEC 1.013  PHURINE 6.5  GLUCOSEU NEGATIVE  HGBUR NEGATIVE  BILIRUBINUR NEGATIVE   KETONESUR NEGATIVE  PROTEINUR NEGATIVE  UROBILINOGEN 0.2  NITRITE NEGATIVE  LEUKOCYTESUR NEGATIVE   Recent Results (from the past 240 hour(s))  MRSA PCR SCREENING     Status: None   Collection Time    07/26/12  8:15 PM      Result Value Range Status   MRSA by PCR NEGATIVE  NEGATIVE Final   Comment:            The GeneXpert MRSA Assay (FDA     approved for NASAL specimens     only), is one component of a     comprehensive MRSA colonization     surveillance program. It is not     intended to diagnose MRSA     infection nor to guide or     monitor treatment for     MRSA infections.    Studies/Results: Dg Chest 2 View  07/26/2012  *RADIOLOGY REPORT*  Clinical Data: Shortness of breath  CHEST - 2 VIEW  Comparison: 06/22/2012  Findings: Again noted multilevel vertebroplasty thoracic spine. There is hazy airspace disease left perihilar and left lower lobe highly suspicious for infiltrate/pneumonia.  Follow-up to resolution after treatment is recommended.  No pulmonary edema.  IMPRESSION: Hazy airspace disease left perihilar and left lower lobe highly suspicious for infiltrate/pneumonia.  Follow-up to resolution after treatment is recommended.   Original Report Authenticated By: Natasha Mead, M.D.     Medications:  Scheduled Meds: . atorvastatin  40 mg Oral Daily  . bisoprolol  2.5 mg Oral Daily  . citalopram  20 mg Oral Daily  . fenofibrate  160 mg Oral Daily  . folic acid  1 mg Oral Daily  . furosemide  40 mg Intravenous BID  . gabapentin  300 mg Oral 5 X Daily  . isosorbide mononitrate  30 mg Oral Daily  . levalbuterol  0.63 mg Nebulization TID  . levofloxacin (LEVAQUIN) IV  750 mg Intravenous Q24H  . methylPREDNISolone (SOLU-MEDROL) injection  40 mg Intravenous Q12H  . mometasone-formoterol  2 puff Inhalation BID  . montelukast  10 mg Oral Daily  . nicotine  21 mg Transdermal Daily  . pantoprazole  40 mg Oral Daily  . Rivaroxaban  20 mg Oral Daily  . sodium chloride  3 mL  Intravenous Q12H  . sodium chloride  3 mL Intravenous Q12H  . tiotropium  18 mcg Inhalation Daily   Continuous Infusions:  PRN Meds:.sodium chloride, acetaminophen, acetaminophen, ALPRAZolam, bisacodyl, nitroGLYCERIN, ondansetron (ZOFRAN) IV, ondansetron, oxyCODONE, oxyCODONE-acetaminophen, sodium chloride  Assessment/Plan:  Principal Problem:   Streptococcal pneumonia Active Problems:   RESPIRATORY FAILURE, CHRONIC   SLEEP APNEA   COPD (chronic obstructive pulmonary disease)   Chronic combined systolic and diastolic CHF (congestive heart failure)   Hyponatremia   Hypokalemia   Strep pneumonia -Was initially being treated with broad-spectrum antibiotics for hospital-acquired pneumonia. Subsequently strep pneumo urinary antigen has returned positive. -I will narrow antibiotics to IV Levaquin which can be transitioned to by mouth at time of discharge.  COPD with acute exacerbation -No wheezing on exam today. -We'll continue to wean steroids and oxygen.  Possibly acute on chronic combined  CHF -She does not appear volume overloaded on exam today. Chest x-ray does not show evidence for volume overload. -I will transition her Lasix back to her by mouth regimen as I do not believe she has an acute decompensation at this time.  Hypokalemia -Replete by mouth. Recheck levels in the morning. -Check magnesium level.  Disposition -Likely discharge home in the morning unless acute issues arise overnight.  Time spent coordinating care: 35 minutes   LOS: 1 day   [facility] Pager: [phone] 07/27/2012, 3:52 PM

## 2012-07-27 NOTE — Progress Notes (Signed)
Utilization Review Completed Retha Bither J. Keanu Lesniak, RN, BSN, NCM [phone]  

## 2012-07-28 DIAGNOSIS — J154 Pneumonia due to other streptococci: Principal | ICD-10-CM

## 2012-07-28 LAB — CBC
HCT: 26.6 % — ABNORMAL LOW (ref 36.0–46.0)
Hemoglobin: 9 g/dL — ABNORMAL LOW (ref 12.0–15.0)
RBC: 2.96 MIL/uL — ABNORMAL LOW (ref 3.87–5.11)
RDW: 15.1 % (ref 11.5–15.5)
WBC: 10.9 10*3/uL — ABNORMAL HIGH (ref 4.0–10.5)

## 2012-07-28 LAB — BASIC METABOLIC PANEL
Chloride: 95 mEq/L — ABNORMAL LOW (ref 96–112)
GFR calc Af Amer: >90 mL/min (ref >90)
Potassium: 3.8 mEq/L (ref 3.5–5.1)

## 2012-07-28 MED ORDER — OXYCODONE HCL 5 MG PO TABS: 5.0000 mg | ORAL_TABLET | ORAL | Status: DC | PRN

## 2012-07-28 MED ORDER — LEVOFLOXACIN 750 MG PO TABS: 750.0000 mg | ORAL_TABLET | Freq: Every day | ORAL | Status: DC

## 2012-07-28 NOTE — Progress Notes (Signed)
INITIAL NUTRITION ASSESSMENT  DOCUMENTATION CODES Per approved criteria  -Not Applicable   INTERVENTION:  No nutrition intervention at this time ---> patient declined RD to follow for nutrition care plan  NUTRITION DIAGNOSIS: No nutrition diagnosis at this time  Goal: Oral intake with meals to meet >/= 90% of estimated nutrition needs  Monitor:  PO intake, weight, labs, I/O's  Reason for Assessment: Consult  67 y.o. female  Admitting Dx: Streptococcal pneumonia  ASSESSMENT: Patient with hx of combined systolic and diastolic CHF, COPD and CAD; admitted for had a cough, chills and fever for past 5 days; also has had worsening of shortness of breath ---> chest X-ray revealed left perihilar and left lower lobe opacities suspicious for pneumonia.   RD consulted due to concern for patient not consuming enough protein given edentulous state; PO intake 50-100% per flowsheet records; patient reports she has no trouble with regular food consistencies; complaining of no salt on meal tray; RD explained patient is currently on therapeutic diet; declining addition of nutrition supplements.  Height: Ht Readings from Last 1 Encounters:  07/26/12 5\' 3"  (1.6 m)    Weight: Wt Readings from Last 1 Encounters:  07/28/12 132 lb 15 oz (60.3 kg)    Ideal Body Weight: 115 lb  % Ideal Body Weight: 117%  Wt Readings from Last 10 Encounters:  07/28/12 132 lb 15 oz (60.3 kg)  07/14/12 129 lb (58.514 kg)  06/28/12 125 lb 12.8 oz (57.063 kg)  06/04/12 130 lb (58.968 kg)  03/04/12 130 lb 14.4 oz (59.376 kg)  02/11/12 129 lb 8 oz (58.741 kg)  01/02/12 132 lb 9.6 oz (60.147 kg)  11/25/11 120 lb 11.2 oz (54.749 kg)  09/13/11 129 lb 10.1 oz (58.8 kg)  07/10/11 133 lb 1.6 oz (60.374 kg)    Usual Body Weight: 130 lb  % Usual Body Weight: 101%  BMI:  Body mass index is 23.55 kg/(m^2).  Estimated Nutritional Needs: Kcal: 1500-1700 Protein: 70-80 gm Fluid: 1.5-1.7 L  Skin: Intact  Diet  Order: Cardiac  EDUCATION NEEDS: -No education needs identified at this time   Intake/Output Summary (Last 24 hours) at 07/28/12 1046 Last data filed at 07/28/12 0800  Gross per 24 hour  Intake   1540 ml  Output   1727 ml  Net   -187 ml    Labs:   Recent Labs Lab 07/26/12 1616 07/27/12 0808 07/28/12 0500  NA 132* 136 134*  K 2.8* 4.4 3.8  CL 91* 98 95*  CO2 33* 32 35*  BUN 13 14 16   CREATININE 0.81 0.77 0.75  CALCIUM 8.3* 8.6 8.8  MG  --   --  2.0  GLUCOSE 94 110* 119*    Scheduled Meds: . atorvastatin  40 mg Oral Daily  . bisoprolol  2.5 mg Oral Daily  . citalopram  20 mg Oral Daily  . fenofibrate  160 mg Oral Daily  . folic acid  1 mg Oral Daily  . furosemide  40 mg Intravenous BID  . gabapentin  300 mg Oral 5 X Daily  . isosorbide mononitrate  30 mg Oral Daily  . levalbuterol  0.63 mg Nebulization TID  . levofloxacin (LEVAQUIN) IV  750 mg Intravenous Q24H  . mometasone-formoterol  2 puff Inhalation BID  . montelukast  10 mg Oral Daily  . nicotine  21 mg Transdermal Daily  . pantoprazole  40 mg Oral Daily  . Rivaroxaban  20 mg Oral Daily  . sodium chloride  3 mL Intravenous  Q12H  . sodium chloride  3 mL Intravenous Q12H  . tiotropium  18 mcg Inhalation Daily    Continuous Infusions:   Past Medical History  Diagnosis Date  . Chronic combined systolic and diastolic CHF (congestive heart failure)     a. 11/2011 Echo: EF 40-45% w/ Gr 1 DD.  Marland Kitchen Deep vein thrombosis     a. Chronic Xarelto.  . Hyperlipidemia   . Sleep apnea   . 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 Coronary artery disease     a. reportedly s/p MI x 2;  b. 2012 Myoview: EF 50% with a large lateral defect consistent with prior myocardial infarction-> Med Rx.  . Chronic pain     due to prior hip fracture and DDD back  . Anginal pain   . History of blood clots     "abdomen and left leg"  (07/26/2012)  . Peripheral  vascular disease   . Myocardial infarction ~ 2010  . Asthma   . On home oxygen therapy     "3L; 24/7" (07/26/2012)  . Pneumonia     "now and several times before" (07/26/2012)  . Chronic bronchitis   . Shortness of breath     "all the time" (07/26/2012)  . H/O hiatal hernia   . Daily headache   . Stroke 2002; 2004    denies residual; "both really light strokes" (07/26/2012)  . Arthritis     "all over" (07/26/2012)  . DDD (degenerative disc disease), lumbosacral   . Depression   . Anxiety     Past Surgical History  Procedure Laterality Date  . Appendectomy    . Cholecystectomy    . Carpal tunnel release Bilateral   . Coronary angioplasty with stent placement      "1; total of 1" (07/26/2012)  . Total hip arthroplasty Right   . Hernia repair      "stomach" (07/26/2012)  . Abdominal hysterectomy    . Fracture surgery    . Foot fracture surgery Left   . Breast surgery Bilateral     benign tumors  . Tumor excision      "stomach and LLE" (07/26/2012)  . Back surgery    . Fixation kyphoplasty lumbar spine      Maureen Chatters, RD, LDN Pager #: [phone] After-Hours Pager #: [phone]

## 2012-07-28 NOTE — Plan of Care (Signed)
Problem: Phase I Progression Outcomes Goal: O2 sats > or equal 90% or at baseline Outcome: Completed/Met Date Met:  07/28/12 Pt O2 Sat 95% on 3Lnc, pt wears 3Lnc at home.

## 2012-07-28 NOTE — Plan of Care (Signed)
Problem: Phase I Progression Outcomes Goal: Dyspnea controlled at rest Outcome: Completed/Met Date Met:  07/28/12 Pt has not had any c/o SOB while at rest Goal: Discharge plan established Outcome: Completed/Met Date Met:  07/28/12 Pt being d/c to home with husband Goal: Tolerating diet Outcome: Completed/Met Date Met:  07/28/12 Pt tolerating diet, no c/o n/v

## 2012-07-28 NOTE — Progress Notes (Signed)
Pt given dc instructions, prescriptions, and follow up appointments, pt verbalized understanding, pt being d/c to home with husband, pt leaving via wheelchair, pt stable upon d/c

## 2012-07-28 NOTE — Discharge Summary (Signed)
Physician Discharge Summary  Patient ID: Aimee Sanders MRN: [id number] DOB/AGE: May 04, 1945 67 y.o.  Admit date: 07/26/2012 Discharge date: 07/28/2012  Primary Care Physician:  Rudi Heap, MD   Discharge Diagnoses:    Principal Problem:   Streptococcal pneumonia Active Problems:   RESPIRATORY FAILURE, CHRONIC   SLEEP APNEA   COPD (chronic obstructive pulmonary disease)   Chronic combined systolic and diastolic CHF (congestive heart failure)   Hyponatremia   Hypokalemia      Medication List    TAKE these medications       albuterol (2.5 MG/3ML) 0.083% nebulizer solution  Commonly known as:  PROVENTIL  Take 2.5 mg by nebulization every 6 (six) hours as needed for wheezing.     ALPRAZolam 0.5 MG tablet  Commonly known as:  XANAX  Take 0.5 mg by mouth 4 (four) times daily as needed for anxiety.     atorvastatin 40 MG tablet  Commonly known as:  LIPITOR  Take 40 mg by mouth daily.     bisoprolol 5 MG tablet  Commonly known as:  ZEBETA  Take 2.5 mg by mouth daily.     citalopram 20 MG tablet  Commonly known as:  CELEXA  Take 20 mg by mouth daily.     fenofibrate 160 MG tablet  Take 160 mg by mouth daily.     Fluticasone-Salmeterol 250-50 MCG/DOSE Aepb  Commonly known as:  ADVAIR  Inhale 1 puff into the lungs every 12 (twelve) hours.     folic acid 1 MG tablet  Commonly known as:  FOLVITE  Take 1 mg by mouth daily.     furosemide 80 MG tablet  Commonly known as:  LASIX  Take 40 mg by mouth 2 (two) times daily.     gabapentin 300 MG capsule  Commonly known as:  NEURONTIN  Take 300 mg by mouth 5 (five) times daily.     ipratropium-albuterol 0.5-2.5 (3) MG/3ML Soln  Commonly known as:  DUONEB  Take 3 mLs by nebulization 3 (three) times daily. For shortness of breath or wheezing     isosorbide mononitrate 30 MG 24 hr tablet  Commonly known as:  IMDUR  Take 30 mg by mouth daily.     levofloxacin 750 MG tablet  Commonly known as:  LEVAQUIN  Take 1  tablet (750 mg total) by mouth daily.     montelukast 10 MG tablet  Commonly known as:  SINGULAIR  Take 10 mg by mouth daily.     nitroGLYCERIN 0.4 MG SL tablet  Commonly known as:  NITROSTAT  Place 0.4 mg under the tongue every 5 (five) minutes as needed. For chest pain     omeprazole 20 MG capsule  Commonly known as:  PRILOSEC  Take 20 mg by mouth daily.     ondansetron 4 MG tablet  Commonly known as:  ZOFRAN  Take 4 mg by mouth every 6 (six) hours as needed for nausea.     oxyCODONE 5 MG immediate release tablet  Commonly known as:  Oxy IR/ROXICODONE  Take 1 tablet (5 mg total) by mouth every 4 (four) hours as needed.     oxyCODONE-acetaminophen 10-325 MG per tablet  Commonly known as:  PERCOCET  Take 1 tablet by mouth every 4 (four) hours as needed for pain. For pain     Rivaroxaban 20 MG Tabs  Commonly known as:  XARELTO  Take 20 mg by mouth daily.     tiotropium 18 MCG inhalation capsule  Commonly  known as:  SPIRIVA  Place 18 mcg into inhaler and inhale daily.         Disposition and Follow-up:  Will be discharged home today in stable and improved condition  Consults: None   Significant Diagnostic Studies:  Dg Chest 2 View  07/26/2012  *RADIOLOGY REPORT*  Clinical Data: Shortness of breath  CHEST - 2 VIEW  Comparison: 06/22/2012  Findings: Again noted multilevel vertebroplasty thoracic spine. There is hazy airspace disease left perihilar and left lower lobe highly suspicious for infiltrate/pneumonia.  Follow-up to resolution after treatment is recommended.  No pulmonary edema.  IMPRESSION: Hazy airspace disease left perihilar and left lower lobe highly suspicious for infiltrate/pneumonia.  Follow-up to resolution after treatment is recommended.   Original Report Authenticated By: Natasha Mead, M.D.     Brief H and P: For complete details please refer to admission H and P, but in brief patient is a 67 y.o. female with multiple medical problems as listed below  including chronic combined systolic and diastolic CHF last EF 40-45% in 2013 R., COPD, CAD status post MI in the past last hospitalized in March with unstable angina and presents with above complaints. She states that for the past one week she has had a cough, and began having the chills as well the past 5 days with a subjective fevers. She also has had worsening of shortness of breath.She admits to a constant chest pain like a heaviness-midsternal, and has been present over the past one week. She reports the chest pain is nonexertional and not worsened by coughing. She also admits to PND and worsening of her leg swelling. She was seen in the ER and a chest x-ray revealed left perihilar and left lower lobe opacities suspicious for pneumonia. Her BNP was 1873(just actually improved from about 3000 in March) in the ED she was started on empiric antibiotics for HCAP and is admitted for further evaluation and management.     Hospital Course:  Principal Problem:   Streptococcal pneumonia Active Problems:   RESPIRATORY FAILURE, CHRONIC   SLEEP APNEA   COPD (chronic obstructive pulmonary disease)   Chronic combined systolic and diastolic CHF (congestive heart failure)   Hyponatremia   Hypokalemia   Strep pneumonia  -Was initially being treated with broad-spectrum antibiotics for hospital-acquired pneumonia. Subsequently strep pneumo urinary antigen has returned positive.  -Abx will be narrowed to PO levaquin for 10 more days at time of DC.   COPD with acute exacerbation  -No wheezing on exam today.  -We'll continue to wean steroids and oxygen.   Possibly acute on chronic combined CHF  -She does not appear volume overloaded on exam today. Chest x-ray does not show evidence for volume overload.  -I will transition her Lasix back to her by mouth regimen as I do not believe she has an acute decompensation at this time.   Hypokalemia  -Repleted -Mag ok at 2.0.   Time spent on  Discharge: Greater than 30 minutes.  SignedChaya Jan [facility] Pager: [phone] 07/28/2012, 1:04 PM

## 2012-08-02 LAB — CULTURE, BLOOD (ROUTINE X 2)
Culture: NO GROWTH
Culture: NO GROWTH

## 2012-08-08 ENCOUNTER — Other Ambulatory Visit: Payer: Self-pay | Admitting: *Deleted

## 2012-08-08 ENCOUNTER — Encounter: Payer: Self-pay | Admitting: *Deleted

## 2012-08-08 MED ORDER — OXYCODONE-ACETAMINOPHEN 10-325 MG PO TABS: 1.0000 | ORAL_TABLET | ORAL | Status: DC | PRN

## 2012-08-08 MED ORDER — ISOSORBIDE MONONITRATE ER 30 MG PO TB24: 30.0000 mg | ORAL_TABLET | Freq: Every day | ORAL | Status: DC

## 2012-08-08 MED ORDER — ALPRAZOLAM 0.5 MG PO TABS: 0.5000 mg | ORAL_TABLET | Freq: Four times a day (QID) | ORAL | Status: DC | PRN

## 2012-08-08 NOTE — Telephone Encounter (Signed)
Last filled 07/07/12 

## 2012-08-09 ENCOUNTER — Telehealth: Payer: Self-pay | Admitting: Nurse Practitioner

## 2012-08-09 NOTE — Telephone Encounter (Signed)
This was done on 08/08/12

## 2012-08-16 ENCOUNTER — Encounter: Payer: Self-pay | Admitting: Cardiology

## 2012-08-16 ENCOUNTER — Other Ambulatory Visit: Payer: Self-pay | Admitting: Nurse Practitioner

## 2012-08-16 ENCOUNTER — Other Ambulatory Visit: Payer: Self-pay | Admitting: *Deleted

## 2012-08-16 MED ORDER — FUROSEMIDE 80 MG PO TABS: 40.0000 mg | ORAL_TABLET | Freq: Two times a day (BID) | ORAL | Status: DC

## 2012-08-16 MED ORDER — RIVAROXABAN 20 MG PO TABS: 20.0000 mg | ORAL_TABLET | Freq: Every day | ORAL | Status: DC

## 2012-08-17 ENCOUNTER — Encounter: Payer: Self-pay | Admitting: Cardiology

## 2012-08-17 ENCOUNTER — Encounter: Payer: PRIVATE HEALTH INSURANCE | Admitting: Cardiology

## 2012-08-17 NOTE — Progress Notes (Signed)
No show  This encounter was created in error - please disregard.

## 2012-08-17 NOTE — Telephone Encounter (Signed)
LAST OV 12/13. 

## 2012-08-22 ENCOUNTER — Telehealth: Payer: Self-pay | Admitting: Nurse Practitioner

## 2012-08-23 ENCOUNTER — Other Ambulatory Visit: Payer: Self-pay | Admitting: Nurse Practitioner

## 2012-08-23 MED ORDER — RIVAROXABAN 20 MG PO TABS: 20.0000 mg | ORAL_TABLET | Freq: Every day | ORAL | Status: DC

## 2012-08-23 NOTE — Telephone Encounter (Signed)
Completed.

## 2012-08-23 NOTE — Telephone Encounter (Signed)
Patient notified that rx was sent to pharmacy  ?

## 2012-08-23 NOTE — Telephone Encounter (Signed)
Please advise 

## 2012-08-24 NOTE — Telephone Encounter (Signed)
Was done yesterday

## 2012-08-24 NOTE — Telephone Encounter (Signed)
LAST OV 2/13 

## 2012-08-24 NOTE — Telephone Encounter (Signed)
Done yesterday- note completed

## 2012-08-31 ENCOUNTER — Inpatient Hospital Stay (HOSPITAL_COMMUNITY)
Admission: EM | Admit: 2012-08-31 | Discharge: 2012-09-06 | DRG: 291 | Disposition: A | Discharge status: Home / Self Care | Payer: PRIVATE HEALTH INSURANCE | Attending: Internal Medicine | Admitting: Internal Medicine

## 2012-08-31 ENCOUNTER — Encounter (HOSPITAL_COMMUNITY): Payer: Self-pay | Admitting: Physician Assistant

## 2012-08-31 ENCOUNTER — Inpatient Hospital Stay (HOSPITAL_COMMUNITY): Payer: PRIVATE HEALTH INSURANCE

## 2012-08-31 ENCOUNTER — Emergency Department (HOSPITAL_COMMUNITY): Payer: PRIVATE HEALTH INSURANCE

## 2012-08-31 DIAGNOSIS — Z9981 Dependence on supplemental oxygen: Secondary | ICD-10-CM

## 2012-08-31 DIAGNOSIS — I509 Heart failure, unspecified: Secondary | ICD-10-CM | POA: Diagnosis present

## 2012-08-31 DIAGNOSIS — F411 Generalized anxiety disorder: Secondary | ICD-10-CM | POA: Diagnosis present

## 2012-08-31 DIAGNOSIS — J9620 Acute and chronic respiratory failure, unspecified whether with hypoxia or hypercapnia: Secondary | ICD-10-CM

## 2012-08-31 DIAGNOSIS — I4729 Other ventricular tachycardia: Secondary | ICD-10-CM | POA: Diagnosis not present

## 2012-08-31 DIAGNOSIS — I10 Essential (primary) hypertension: Secondary | ICD-10-CM | POA: Diagnosis present

## 2012-08-31 DIAGNOSIS — J449 Chronic obstructive pulmonary disease, unspecified: Secondary | ICD-10-CM

## 2012-08-31 DIAGNOSIS — E876 Hypokalemia: Secondary | ICD-10-CM | POA: Diagnosis present

## 2012-08-31 DIAGNOSIS — I5043 Acute on chronic combined systolic (congestive) and diastolic (congestive) heart failure: Principal | ICD-10-CM

## 2012-08-31 DIAGNOSIS — I472 Ventricular tachycardia, unspecified: Secondary | ICD-10-CM

## 2012-08-31 DIAGNOSIS — J441 Chronic obstructive pulmonary disease with (acute) exacerbation: Secondary | ICD-10-CM

## 2012-08-31 DIAGNOSIS — D509 Iron deficiency anemia, unspecified: Secondary | ICD-10-CM | POA: Diagnosis present

## 2012-08-31 DIAGNOSIS — J4489 Other specified chronic obstructive pulmonary disease: Secondary | ICD-10-CM | POA: Diagnosis present

## 2012-08-31 DIAGNOSIS — J189 Pneumonia, unspecified organism: Secondary | ICD-10-CM | POA: Diagnosis present

## 2012-08-31 DIAGNOSIS — I5042 Chronic combined systolic (congestive) and diastolic (congestive) heart failure: Secondary | ICD-10-CM

## 2012-08-31 DIAGNOSIS — E785 Hyperlipidemia, unspecified: Secondary | ICD-10-CM | POA: Diagnosis present

## 2012-08-31 DIAGNOSIS — F17200 Nicotine dependence, unspecified, uncomplicated: Secondary | ICD-10-CM | POA: Diagnosis present

## 2012-08-31 DIAGNOSIS — R7989 Other specified abnormal findings of blood chemistry: Secondary | ICD-10-CM

## 2012-08-31 DIAGNOSIS — R0902 Hypoxemia: Secondary | ICD-10-CM | POA: Diagnosis present

## 2012-08-31 DIAGNOSIS — I2489 Other forms of acute ischemic heart disease: Secondary | ICD-10-CM | POA: Diagnosis present

## 2012-08-31 DIAGNOSIS — I248 Other forms of acute ischemic heart disease: Secondary | ICD-10-CM | POA: Diagnosis present

## 2012-08-31 DIAGNOSIS — I2510 Atherosclerotic heart disease of native coronary artery without angina pectoris: Secondary | ICD-10-CM

## 2012-08-31 LAB — POCT I-STAT 3, ART BLOOD GAS (G3+)
Acid-Base Excess: 6 mmol/L — ABNORMAL HIGH (ref 0.0–2.0)
Acid-Base Excess: 7 mmol/L — ABNORMAL HIGH (ref 0.0–2.0)
Bicarbonate: 32.8 mEq/L — ABNORMAL HIGH (ref 20.0–24.0)
Bicarbonate: 34.1 mEq/L — ABNORMAL HIGH (ref 20.0–24.0)
Bicarbonate: 34.1 mEq/L — ABNORMAL HIGH (ref 20.0–24.0)
O2 Saturation: 85 %
O2 Saturation: 98 %
O2 Saturation: 99 %
Patient temperature: 98.6
Patient temperature: 98.6
TCO2: 35 mmol/L (ref 0–100)
TCO2: 36 mmol/L (ref 0–100)
pCO2 arterial: 49.8 mmHg — ABNORMAL HIGH (ref 35.0–45.0)
pH, Arterial: 7.351 (ref 7.350–7.450)
pH, Arterial: 7.442 (ref 7.350–7.450)
pO2, Arterial: 135 mmHg — ABNORMAL HIGH (ref 80.0–100.0)

## 2012-08-31 LAB — BASIC METABOLIC PANEL
CO2: 34 mEq/L — ABNORMAL HIGH (ref 19–32)
Calcium: 9.2 mg/dL (ref 8.4–10.5)
Creatinine, Ser: 0.66 mg/dL (ref 0.50–1.10)
GFR calc non Af Amer: 89 mL/min — ABNORMAL LOW (ref >90)
Glucose, Bld: 138 mg/dL — ABNORMAL HIGH (ref 70–99)
Sodium: 139 mEq/L (ref 135–145)

## 2012-08-31 LAB — CBC WITH DIFFERENTIAL/PLATELET
Eosinophils Absolute: 0 10*3/uL (ref 0.0–0.7)
Eosinophils Relative: 0 % (ref 0–5)
HCT: 31.6 % — ABNORMAL LOW (ref 36.0–46.0)
Lymphocytes Relative: 5 % — ABNORMAL LOW (ref 12–46)
Lymphs Abs: 0.8 10*3/uL (ref 0.7–4.0)
MCH: 31.2 pg (ref 26.0–34.0)
MCV: 92.1 fL (ref 78.0–100.0)
Monocytes Absolute: 0.5 10*3/uL (ref 0.1–1.0)
Platelets: 306 10*3/uL (ref 150–400)
RBC: 3.43 MIL/uL — ABNORMAL LOW (ref 3.87–5.11)
RDW: 15.2 % (ref 11.5–15.5)

## 2012-08-31 LAB — TROPONIN I
Troponin I: 0.73 ng/mL (ref <0.30)
Troponin I: 1.02 ng/mL (ref <0.30)

## 2012-08-31 LAB — PROTIME-INR
INR: 1.04 (ref 0.00–1.49)
Prothrombin Time: 13.5 seconds (ref 11.6–15.2)

## 2012-08-31 LAB — APTT: aPTT: 27 seconds (ref 24–37)

## 2012-08-31 MED ORDER — OXYCODONE HCL 5 MG PO TABS
5.0000 mg | ORAL_TABLET | ORAL | Status: DC | PRN
  Administered 2012-09-01 – 2012-09-06 (×18): 5 mg via ORAL
  Filled 2012-08-31 (×18): qty 1

## 2012-08-31 MED ORDER — METHYLPREDNISOLONE SODIUM SUCC 125 MG IJ SOLR
80.0000 mg | Freq: Four times a day (QID) | INTRAMUSCULAR | Status: DC
  Administered 2012-08-31 – 2012-09-01 (×4): 80 mg via INTRAVENOUS
  Filled 2012-08-31 (×2): qty 1.28
  Filled 2012-08-31: qty 2
  Filled 2012-08-31 (×2): qty 1.28
  Filled 2012-08-31 (×2): qty 2

## 2012-08-31 MED ORDER — HEPARIN BOLUS VIA INFUSION
3200.0000 [IU] | Freq: Once | INTRAVENOUS | Status: AC
  Administered 2012-08-31: 3200 [IU] via INTRAVENOUS

## 2012-08-31 MED ORDER — ALPRAZOLAM 0.5 MG PO TABS
0.5000 mg | ORAL_TABLET | Freq: Four times a day (QID) | ORAL | Status: DC | PRN
  Administered 2012-09-01 – 2012-09-02 (×5): 0.5 mg via ORAL
  Filled 2012-08-31 (×5): qty 1

## 2012-08-31 MED ORDER — MONTELUKAST SODIUM 10 MG PO TABS
10.0000 mg | ORAL_TABLET | Freq: Every day | ORAL | Status: DC
  Administered 2012-08-31 – 2012-09-06 (×7): 10 mg via ORAL
  Filled 2012-08-31 (×7): qty 1

## 2012-08-31 MED ORDER — DOXYCYCLINE HYCLATE 100 MG PO TABS
100.0000 mg | ORAL_TABLET | Freq: Once | ORAL | Status: AC
  Administered 2012-08-31: 100 mg via ORAL
  Filled 2012-08-31: qty 1

## 2012-08-31 MED ORDER — ASPIRIN 325 MG PO TABS: 325.0000 mg | ORAL_TABLET | Freq: Once | ORAL | Status: DC

## 2012-08-31 MED ORDER — CITALOPRAM HYDROBROMIDE 20 MG PO TABS
20.0000 mg | ORAL_TABLET | Freq: Every day | ORAL | Status: DC
  Administered 2012-09-01 – 2012-09-06 (×6): 20 mg via ORAL
  Filled 2012-08-31 (×6): qty 1

## 2012-08-31 MED ORDER — FUROSEMIDE 10 MG/ML IJ SOLN
80.0000 mg | Freq: Once | INTRAMUSCULAR | Status: AC
  Administered 2012-08-31: 80 mg via INTRAVENOUS
  Filled 2012-08-31: qty 8

## 2012-08-31 MED ORDER — SODIUM CHLORIDE 0.9 % IV SOLN
250.0000 mg | Freq: Four times a day (QID) | INTRAVENOUS | Status: DC
  Administered 2012-08-31 – 2012-09-06 (×25): 250 mg via INTRAVENOUS
  Filled 2012-08-31 (×27): qty 250

## 2012-08-31 MED ORDER — OXYCODONE-ACETAMINOPHEN 10-325 MG PO TABS: 1.0000 | ORAL_TABLET | ORAL | Status: DC | PRN

## 2012-08-31 MED ORDER — ACETAMINOPHEN 650 MG RE SUPP: 650.0000 mg | Freq: Four times a day (QID) | RECTAL | Status: DC | PRN

## 2012-08-31 MED ORDER — ONDANSETRON HCL 4 MG PO TABS
4.0000 mg | ORAL_TABLET | Freq: Four times a day (QID) | ORAL | Status: DC | PRN
  Administered 2012-09-06: 4 mg via ORAL
  Filled 2012-08-31: qty 1

## 2012-08-31 MED ORDER — ALBUTEROL (5 MG/ML) CONTINUOUS INHALATION SOLN
10.0000 mg/h | INHALATION_SOLUTION | Freq: Once | RESPIRATORY_TRACT | Status: AC
  Administered 2012-08-31: 10 mg/h via RESPIRATORY_TRACT
  Filled 2012-08-31: qty 20

## 2012-08-31 MED ORDER — SODIUM CHLORIDE 0.9 % IJ SOLN
3.0000 mL | Freq: Two times a day (BID) | INTRAMUSCULAR | Status: DC
  Administered 2012-09-01 – 2012-09-06 (×9): 3 mL via INTRAVENOUS

## 2012-08-31 MED ORDER — BIOTENE DRY MOUTH MT LIQD
15.0000 mL | Freq: Two times a day (BID) | OROMUCOSAL | Status: DC
  Administered 2012-08-31 – 2012-09-06 (×12): 15 mL via OROMUCOSAL

## 2012-08-31 MED ORDER — MOMETASONE FURO-FORMOTEROL FUM 100-5 MCG/ACT IN AERO
2.0000 | INHALATION_SPRAY | Freq: Two times a day (BID) | RESPIRATORY_TRACT | Status: DC
  Administered 2012-08-31 – 2012-09-02 (×4): 2 via RESPIRATORY_TRACT
  Filled 2012-08-31: qty 8.8

## 2012-08-31 MED ORDER — GABAPENTIN 300 MG PO CAPS
300.0000 mg | ORAL_CAPSULE | Freq: Every day | ORAL | Status: DC
  Administered 2012-08-31 – 2012-09-06 (×30): 300 mg via ORAL
  Filled 2012-08-31 (×34): qty 1

## 2012-08-31 MED ORDER — METHYLPREDNISOLONE SODIUM SUCC 125 MG IJ SOLR
125.0000 mg | Freq: Once | INTRAMUSCULAR | Status: AC
  Administered 2012-08-31: 125 mg via INTRAVENOUS
  Filled 2012-08-31: qty 2

## 2012-08-31 MED ORDER — IPRATROPIUM BROMIDE 0.02 % IN SOLN
0.5000 mg | RESPIRATORY_TRACT | Status: DC
  Administered 2012-08-31 – 2012-09-01 (×4): 0.5 mg via RESPIRATORY_TRACT
  Filled 2012-08-31 (×4): qty 2.5

## 2012-08-31 MED ORDER — LEVALBUTEROL HCL 0.63 MG/3ML IN NEBU
0.6300 mg | INHALATION_SOLUTION | Freq: Four times a day (QID) | RESPIRATORY_TRACT | Status: DC
  Filled 2012-08-31 (×3): qty 3

## 2012-08-31 MED ORDER — IPRATROPIUM BROMIDE 0.02 % IN SOLN
1.0000 mg | Freq: Once | RESPIRATORY_TRACT | Status: AC
  Administered 2012-08-31: 1 mg via RESPIRATORY_TRACT
  Filled 2012-08-31: qty 5

## 2012-08-31 MED ORDER — IPRATROPIUM BROMIDE 0.02 % IN SOLN
0.5000 mg | Freq: Four times a day (QID) | RESPIRATORY_TRACT | Status: DC
Start: 2012-08-31 — End: 2012-08-31

## 2012-08-31 MED ORDER — DOXYCYCLINE HYCLATE 100 MG PO TABS: 100.0000 mg | ORAL_TABLET | Freq: Two times a day (BID) | ORAL | Status: DC

## 2012-08-31 MED ORDER — HEPARIN (PORCINE) IN NACL 100-0.45 UNIT/ML-% IJ SOLN
650.0000 [IU]/h | INTRAMUSCULAR | Status: DC
  Administered 2012-08-31: 650 [IU]/h via INTRAVENOUS
  Filled 2012-08-31: qty 250

## 2012-08-31 MED ORDER — PANTOPRAZOLE SODIUM 40 MG PO TBEC
40.0000 mg | DELAYED_RELEASE_TABLET | Freq: Every day | ORAL | Status: DC
  Administered 2012-08-31 – 2012-09-06 (×7): 40 mg via ORAL
  Filled 2012-08-31 (×7): qty 1

## 2012-08-31 MED ORDER — ACETAMINOPHEN 325 MG PO TABS: 650.0000 mg | ORAL_TABLET | Freq: Four times a day (QID) | ORAL | Status: DC | PRN

## 2012-08-31 MED ORDER — ONDANSETRON HCL 4 MG/2ML IJ SOLN: 4.0000 mg | Freq: Four times a day (QID) | INTRAMUSCULAR | Status: DC | PRN

## 2012-08-31 MED ORDER — BUPROPION HCL ER (SR) 150 MG PO TB12
150.0000 mg | ORAL_TABLET | Freq: Every day | ORAL | Status: DC
  Administered 2012-08-31 – 2012-09-06 (×7): 150 mg via ORAL
  Filled 2012-08-31 (×7): qty 1

## 2012-08-31 MED ORDER — MORPHINE SULFATE 2 MG/ML IJ SOLN
2.0000 mg | Freq: Once | INTRAMUSCULAR | Status: AC
  Administered 2012-08-31: 2 mg via INTRAVENOUS
  Filled 2012-08-31: qty 1

## 2012-08-31 MED ORDER — FUROSEMIDE 10 MG/ML IJ SOLN: 80.0000 mg | Freq: Once | INTRAMUSCULAR | Status: DC

## 2012-08-31 MED ORDER — PHENOL 1.4 % MT LIQD
1.0000 | OROMUCOSAL | Status: DC | PRN
  Administered 2012-09-01: 1 via OROMUCOSAL
  Filled 2012-08-31: qty 177

## 2012-08-31 MED ORDER — MAGNESIUM SULFATE 40 MG/ML IJ SOLN
2.0000 g | Freq: Once | INTRAMUSCULAR | Status: AC
  Administered 2012-08-31: 2 g via INTRAVENOUS
  Filled 2012-08-31: qty 50

## 2012-08-31 MED ORDER — OXYCODONE-ACETAMINOPHEN 5-325 MG PO TABS
1.0000 | ORAL_TABLET | ORAL | Status: DC | PRN
  Administered 2012-09-03 – 2012-09-06 (×4): 1 via ORAL
  Filled 2012-08-31 (×4): qty 1

## 2012-08-31 MED ORDER — FENOFIBRATE 160 MG PO TABS
160.0000 mg | ORAL_TABLET | Freq: Every day | ORAL | Status: DC
  Administered 2012-09-01 – 2012-09-06 (×6): 160 mg via ORAL
  Filled 2012-08-31 (×7): qty 1

## 2012-08-31 MED ORDER — FOLIC ACID 1 MG PO TABS
1.0000 mg | ORAL_TABLET | Freq: Every day | ORAL | Status: DC
  Administered 2012-08-31 – 2012-09-06 (×7): 1 mg via ORAL
  Filled 2012-08-31 (×7): qty 1

## 2012-08-31 MED ORDER — BENZONATATE 100 MG PO CAPS
200.0000 mg | ORAL_CAPSULE | Freq: Three times a day (TID) | ORAL | Status: DC | PRN
  Filled 2012-08-31: qty 2

## 2012-08-31 MED ORDER — FUROSEMIDE 40 MG PO TABS
40.0000 mg | ORAL_TABLET | Freq: Two times a day (BID) | ORAL | Status: DC
  Administered 2012-09-01: 40 mg via ORAL
  Filled 2012-08-31 (×2): qty 1

## 2012-08-31 MED ORDER — LORAZEPAM 2 MG/ML IJ SOLN
1.0000 mg | Freq: Once | INTRAMUSCULAR | Status: AC
  Administered 2012-08-31: 1 mg via INTRAVENOUS
  Filled 2012-08-31: qty 1

## 2012-08-31 MED ORDER — VANCOMYCIN HCL 500 MG IV SOLR
500.0000 mg | Freq: Two times a day (BID) | INTRAVENOUS | Status: DC
  Administered 2012-09-01 – 2012-09-03 (×5): 500 mg via INTRAVENOUS
  Filled 2012-08-31 (×8): qty 500

## 2012-08-31 MED ORDER — VANCOMYCIN HCL IN DEXTROSE 1-5 GM/200ML-% IV SOLN
1000.0000 mg | Freq: Once | INTRAVENOUS | Status: AC
  Administered 2012-09-01: 1000 mg via INTRAVENOUS
  Filled 2012-08-31 (×2): qty 200

## 2012-08-31 MED ORDER — ATORVASTATIN CALCIUM 40 MG PO TABS
40.0000 mg | ORAL_TABLET | Freq: Every day | ORAL | Status: DC
  Administered 2012-08-31 – 2012-09-06 (×7): 40 mg via ORAL
  Filled 2012-08-31 (×7): qty 1

## 2012-08-31 MED ORDER — ALBUTEROL SULFATE (5 MG/ML) 0.5% IN NEBU
2.5000 mg | INHALATION_SOLUTION | RESPIRATORY_TRACT | Status: DC
  Administered 2012-08-31 – 2012-09-01 (×4): 2.5 mg via RESPIRATORY_TRACT
  Filled 2012-08-31 (×4): qty 0.5

## 2012-08-31 MED ORDER — IOHEXOL 350 MG/ML SOLN
100.0000 mL | Freq: Once | INTRAVENOUS | Status: AC | PRN
  Administered 2012-08-31: 80 mL via INTRAVENOUS

## 2012-08-31 NOTE — Progress Notes (Signed)
ANTICOAGULATION CONSULT NOTE - FOLLOW UP  Pharmacy Consult:  Heparin Indication: chest pain/ACS  Allergies  Allergen Reactions  . Levofloxacin Hives  . Penicillins Hives and Itching    Patient Measurements: Height: 5\' 4"  (162.6 cm) Weight: 128 lb 4.9 oz (58.2 kg) IBW/kg (Calculated) : 54.7 Heparin dosing weight = 58 kg  Vital Signs: Temp: 98.6 F (37 C) (05/14 2000) Temp src: Oral (05/14 2000) BP: 98/58 mmHg (05/14 2306) Pulse Rate: 103 (05/14 2306)  Labs:  Recent Labs  08/31/12 1303 08/31/12 1421 08/31/12 1622 08/31/12 2226 08/31/12 2227  HGB 10.7*  --   --   --   --   HCT 31.6*  --   --   --   --   PLT 306  --   --   --   --   APTT  --  27  --  49*  --   LABPROT  --  13.5  --   --   --   INR  --  1.04  --   --   --   CREATININE 0.66  --   --   --   --   TROPONINI  --   --  0.73*  --  1.02*    Estimated Creatinine Clearance: 58.1 ml/min (by C-G formula based on Cr of 0.66).      Assessment: 67 YOF on Xarelto PTA for history of DVT.  Patient admitted with chest pain and anticoagulation was switched to IV heparin.  Due to the effect of Xarelto on heparin levels, we are initially using aPTT to assist with heparin dosing.  APTT is sub-therapeutic; no bleeding reported.  No complications with infusion per RN.   Goal of Therapy:  aPTT 66-102 sec HL 0.3 - 0.7 units/mL Monitor platelets by anticoagulation protocol: Yes    Plan:  - Increase heparin gtt to 800 units/hr - Check aPTT/HL in 6 hrs    Yaritsa Savarino D. Laney Potash, PharmD, BCPS Pager:  [phone] 09/01/2012, 12:01 AM

## 2012-08-31 NOTE — ED Notes (Signed)
MD at bedside. Dr. Tat 

## 2012-08-31 NOTE — ED Notes (Signed)
Called respiratory for stat ABG.

## 2012-08-31 NOTE — H&P (Signed)
[facility] History and Physical  DAWNN NAM XBJ:[id number] DOB: 1945-10-17 DOA: 08/31/2012   PCP: Rudi Heap, MD   Chief Complaint: Shortness of breath  HPI:  67 year old female with a history of COPD with continued tobacco use, on 3 L nasal cannula at home, DVT on xarelto, hyperlipidemia, and coronary artery disease presents with 4 days of worsening shortness of breath. Patient has been complaining of a nonproductive cough without any fevers or chills. Unfortunately the patient's history has been limited due to her extremis at this time. She does complain of nausea without any vomiting. She has been complaining of chest discomfort at rest for the last 2 days. She denies any fevers, chills abdominal pain, dysuria, hematuria, hemoptysis. The patient continues to smoke 3-4 cigarettes per day. She has a history of 80 pack years. The patient endorses compliance with her medications. Of note, the patient was recently admitted between 07/26/12 and 07/28/12 for pneumococcal PNA. Her urine streptococcal pneumonia antigen was positive. The patient was discharged with levofloxacin.  In the emergency department, the patient was noted to have some distress. The patient was given furosemide 80 mg IV x1, Solu-Medrol 125mg  with aerosolized albuterol and Atrovent. The patient was placed on BiPAP. ABG showed 7.35/59/54/32. The patient was given Ativan 1 mg IV and morphine 2 mg IV which helped with her respirations. WBC was noted to be 15.8. BMP was unremarkable. ProBNP was 2598 and troponin was elevated at 0.21. Chest x-ray showed mild increased perihilar interstitial prominence concerning for pneumonitis. CT interim of the chest was ordered by the emergency department, and it is pending at this time Assessment/Plan: Acute on chronic respiratory failure -Suspect that this is due to COPD exacerbation and superimposed infectious process -Continue BiPAP -Repeat ABG -May need pulmonary  consultation -Aerosolized albuterol and Atrovent -Continue Solu-Medrol 80 mg IV every 6 hours Leukocytosis/pulmonary interstitial infiltrates -Blood cultures x2 sets -Empiric broad-spectrum antibiotics given the patient's recent admission to the hospital -Start patient on empiric vancomycin and imipenem -Patient has a rash to PCN Elevated troponin -Cardiology has been consulted from the emergency department -The patient was started on a heparin drip by ED -cycle troponin Elevated proBNP -Clinically, patient does not appear to be fluid overloaded -Suspect this elevation may partly be due to the patient's tachycardia and suspected underlying cor pulmonale -Patient has chronic systolic and diastolic CHF with EF of 40-45% -Patient given 80 mg furosemide IV in the emergency department Hypertension -Hold beta blocker at this time as the patient's blood pressure is soft -Hold Imdur due to soft blood pressure Anxiety -Patient takes Xanax 0.5 mg every 6 hours when necessary anxiety -Continue at this time Tobacco abuse -Tobacco cessation discussed History of DVT lower extremities -Patient was placed on heparin drip do to elevated troponin -If heparin drip was discontinued, patient will need to be restarted on rivaroxaban      Past Medical History  Diagnosis Date  . Chronic combined systolic and diastolic CHF (congestive heart failure)     11/2011 - Echo: EF 40-45% w/ Gr 1 DD.  Marland Kitchen Deep vein thrombosis     Chronic Xarelto.  . Hyperlipidemia   . Sleep apnea   . 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 Coronary atherosclerosis of native coronary artery     a. reportedly s/p MI x 2;  b. March 2014 Cardiolite: EF 68% with scar affecting the inferolateral and anterolateral wall; mild ischemia in  the mid-septum-> Med Rx.  . Chronic pain     Due to prior hip fracture and DDD back  . Peripheral vascular disease   . Pneumonia      "now and several times before" (07/26/2012)  . H/O hiatal hernia   . Stroke 2002; 2004    Denies residual; "both really light strokes" (07/26/2012)  . Arthritis     "all over" (07/26/2012)  . DDD (degenerative disc disease), lumbosacral   . Depression   . Anxiety    Past Surgical History  Procedure Laterality Date  . Appendectomy    . Cholecystectomy    . Carpal tunnel release Bilateral   . Coronary angioplasty with stent placement      "1; total of 1" (07/26/2012)  . Total hip arthroplasty Right   . Hernia repair      "stomach" (07/26/2012)  . Abdominal hysterectomy    . Fracture surgery    . Foot fracture surgery Left   . Breast surgery Bilateral     benign tumors  . Tumor excision      "stomach and LLE" (07/26/2012)  . Back surgery    . Fixation kyphoplasty lumbar spine     Social History:  reports that she has been smoking Cigarettes.  She has a 55 pack-year smoking history. She has never used smokeless tobacco. She reports that she does not drink alcohol or use illicit drugs.   Family History  Problem Relation Age of Onset  . Hypertension Mother   . Breast cancer Mother   . Rheum arthritis Maternal Grandmother   . Cancer Sister      Allergies  Allergen Reactions  . Levofloxacin Hives  . Penicillins Hives and Itching      Prior to Admission medications   Medication Sig Start Date End Date Taking? Authorizing Provider  albuterol (PROVENTIL) (2.5 MG/3ML) 0.083% nebulizer solution Take 2.5 mg by nebulization every 6 (six) hours as needed for wheezing.   Yes Historical Provider, MD  ALPRAZolam Prudy Feeler) 0.5 MG tablet Take 1 tablet (0.5 mg total) by mouth 4 (four) times daily as needed for anxiety. 08/08/12  Yes Mary-Margaret Daphine Deutscher, FNP  atorvastatin (LIPITOR) 40 MG tablet Take 40 mg by mouth daily.   Yes Historical Provider, MD  benzonatate (TESSALON) 100 MG capsule Take 200 mg by mouth 3 (three) times daily as needed for cough.   Yes Historical Provider, MD  bisoprolol  (ZEBETA) 5 MG tablet Take 2.5 mg by mouth daily. 07/14/12  Yes Prescott Parma, PA-C  buPROPion (WELLBUTRIN SR) 150 MG 12 hr tablet Take 150 mg by mouth daily.   Yes Historical Provider, MD  citalopram (CELEXA) 20 MG tablet Take 20 mg by mouth daily. 07/06/12  Yes Mary-Margaret Daphine Deutscher, FNP  fenofibrate 160 MG tablet Take 160 mg by mouth daily. 07/08/12  Yes Mary-Margaret Daphine Deutscher, FNP  Fluticasone-Salmeterol (ADVAIR) 250-50 MCG/DOSE AEPB Inhale 1 puff into the lungs every 12 (twelve) hours. 07/20/12  Yes Mary-Margaret Daphine Deutscher, FNP  folic acid (FOLVITE) 1 MG tablet Take 1 mg by mouth daily.   Yes Historical Provider, MD  furosemide (LASIX) 80 MG tablet Take 0.5 tablets (40 mg total) by mouth 2 (two) times daily. 08/16/12  Yes Mary-Margaret Daphine Deutscher, FNP  gabapentin (NEURONTIN) 300 MG capsule Take 300 mg by mouth 5 (five) times daily.  06/15/12  Yes Historical Provider, MD  ipratropium-albuterol (DUONEB) 0.5-2.5 (3) MG/3ML SOLN Take 3 mLs by nebulization 3 (three) times daily. For shortness of breath or wheezing 07/27/12  Yes Mary-Margaret  Daphine Deutscher, FNP  isosorbide mononitrate (IMDUR) 30 MG 24 hr tablet Take 30 mg by mouth daily.   Yes Historical Provider, MD  metoprolol tartrate (LOPRESSOR) 25 MG tablet Take 25 mg by mouth 2 (two) times daily.   Yes Historical Provider, MD  montelukast (SINGULAIR) 10 MG tablet Take 10 mg by mouth daily. 07/06/12  Yes Mary-Margaret Daphine Deutscher, FNP  omeprazole (PRILOSEC) 20 MG capsule Take 20 mg by mouth daily.   Yes Historical Provider, MD  ondansetron (ZOFRAN) 4 MG tablet Take 4 mg by mouth every 6 (six) hours as needed for nausea. 06/04/12  Yes Nicoletta Dress. Colon Branch, MD  oxyCODONE (OXY IR/ROXICODONE) 5 MG immediate release tablet Take 1 tablet (5 mg total) by mouth every 4 (four) hours as needed. 07/28/12  Yes Estela Isaiah Blakes, MD  oxyCODONE-acetaminophen (PERCOCET) 10-325 MG per tablet Take 1 tablet by mouth every 4 (four) hours as needed for pain. 08/08/12  Yes Mary-Margaret Daphine Deutscher, FNP   potassium chloride (K-DUR) 10 MEQ tablet Take 10 mEq by mouth daily.   Yes Historical Provider, MD  Rivaroxaban (XARELTO) 20 MG TABS Take 1 tablet (20 mg total) by mouth daily. 08/23/12  Yes Mary-Margaret Daphine Deutscher, FNP  tiotropium (SPIRIVA) 18 MCG inhalation capsule Place 18 mcg into inhaler and inhale daily. 07/06/12  Yes Mary-Margaret Daphine Deutscher, FNP  nitroGLYCERIN (NITROSTAT) 0.4 MG SL tablet Place 0.4 mg under the tongue every 5 (five) minutes as needed. For chest pain    Historical Provider, MD    Review of Systems:  Constitutional:  No weight loss, night sweats, Fevers, chills, fatigue.  Head&Eyes: No headache.  No vision loss.  No eye pain or scotoma ENT:  No Difficulty swallowing,Tooth/dental problems,Sore throat,   Cardio-vascular:  Nodizziness, palpitations  GI:  No  abdominal pain, nausea, vomiting, diarrhea, loss of appetite, hematochezia, melena, heartburn, indigestion, Resp:  Complains of shortness breath and nonproductive cough. No hemoptysis.  Skin:  no rash or lesions.  GU:  no dysuria, change in color of urine, no urgency or frequency. No flank pain.  Musculoskeletal:  No joint pain or swelling. No decreased range of motion. No back pain.  Psych:  Feels anxious Neurologic: No headache, no dysesthesia, no focal weakness, no vision loss. No syncope  Physical Exam: Filed Vitals:   08/31/12 1445 08/31/12 1500 08/31/12 1515 08/31/12 1530  BP: 110/77 120/75 111/83 111/75  Pulse: 116 114 118 113  Temp:      TempSrc:      Resp: 34 31 32 20  Height:      Weight:      SpO2: 98% 98% 99% 98%   General:  A&O , NAD, nontoxic, mild extremis Head/Eye: No conjunctival hemorrhage, no icterus, [place]/AT, No nystagmus ENT:  No icterus,  No thrush, no pharyngeal exudate Neck:  No masses, no lymphadenpathy, no bruits CV:  RRR, no rub, no gallop, no S3 Lung: Bibasilar expiratory wheeze. Scatter rales Abdomen: soft/NT, +BS, nondistended, no peritoneal signs Ext: No cyanosis, No  rashes, No petechiae, No lymphangitis, trace LE edema   Labs on Admission:  Basic Metabolic Panel:  Recent Labs Lab 08/31/12 1303  NA 139  K 4.2  CL 96  CO2 34*  GLUCOSE 138*  BUN 10  CREATININE 0.66  CALCIUM 9.2   Liver Function Tests: No results found for this basename: AST, ALT, ALKPHOS, BILITOT, PROT, ALBUMIN,  in the last 168 hours No results found for this basename: LIPASE, AMYLASE,  in the last 168 hours No results found for this  basename: AMMONIA,  in the last 168 hours CBC:  Recent Labs Lab 08/31/12 1303  WBC 15.8*  NEUTROABS 14.4*  HGB 10.7*  HCT 31.6*  MCV 92.1  PLT 306   Cardiac Enzymes: No results found for this basename: CKTOTAL, CKMB, CKMBINDEX, TROPONINI,  in the last 168 hours BNP: No components found with this basename: POCBNP,  CBG: No results found for this basename: GLUCAP,  in the last 168 hours  Radiological Exams on Admission: Dg Chest Portable 1 View  08/31/2012   *RADIOLOGY REPORT*  Clinical Data: COPD, sleep apnea, shortness of breath.  PORTABLE CHEST - 1 VIEW  Comparison: 08/29/2012.  Findings: Cardiomegaly again noted.  Mild perihilar interstitial prominence suspicious for pneumonitis or bronchitic changes.  No segmental infiltrate. Prior vertebroplasty again noted.  IMPRESSION: Cardiomegaly.  Mild perihilar interstitial prominence suspicious for pneumonitis or bronchitic changes.  No segmental infiltrate.   Original Report Authenticated By: Natasha Mead, M.D.    EKG: Independently reviewed. Sinus tachycardia, no ST-T wave change    Time spent:70 minutes Code Status:   Full code Family Communication:   No Family at bedside   Jennie Hannay, DO  [facility] Pager [phone]  If 7PM-7AM, please contact night-coverage [email or web address] Password [facility] 08/31/2012, 3:37 PM

## 2012-08-31 NOTE — Progress Notes (Signed)
ANTICOAGULATION CONSULT NOTE - Initial Consult  Pharmacy Consult for Heparin Indication: chest pain/ACS, pt also on Xarelto for H/O DVT  Allergies  Allergen Reactions  . Levofloxacin Hives  . Penicillins Hives and Itching    Patient Measurements: Height: 5\' 3"  (160 cm) Weight: 121 lb (54.885 kg) IBW/kg (Calculated) : 52.4  Vital Signs: Temp: 97.6 F (36.4 C) (05/14 1122) Temp src: Oral (05/14 1122) BP: 97/81 mmHg (05/14 1327) Pulse Rate: 126 (05/14 1327)  Labs:  Recent Labs  08/31/12 1303  HGB 10.7*  HCT 31.6*  PLT 306    Estimated Creatinine Clearance: 55.7 ml/min (by C-G formula based on Cr of 0.75).   Medical History: Past Medical History  Diagnosis Date  . Chronic combined systolic and diastolic CHF (congestive heart failure)     11/2011 - Echo: EF 40-45% w/ Gr 1 DD.  Marland Kitchen Deep vein thrombosis     Chronic Xarelto.  . Hyperlipidemia   . Sleep apnea   . 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 Coronary atherosclerosis of native coronary artery     a. reportedly s/p MI x 2;  b. 2012 Myoview: EF 50% with a large lateral defect consistent with prior myocardial infarction-> Med Rx.  . Chronic pain     Due to prior hip fracture and DDD back  . Peripheral vascular disease   . Pneumonia     "now and several times before" (07/26/2012)  . H/O hiatal hernia   . Stroke 2002; 2004    Denies residual; "both really light strokes" (07/26/2012)  . Arthritis     "all over" (07/26/2012)  . DDD (degenerative disc disease), lumbosacral   . Depression   . Anxiety     Medications:  Xarelto  Assessment: 67 y/o with CP to start heparin per rx, of note, also on Xarelto PTA for h/o DVT. POC Trop 0.21. CBC low, but appears stable from previous visits. Pt reports NO overt bleeding. Renal function stable with Scr 0.66. On Xarelto, so will dose initially based on PTT, but will also draw heparin levels for correlation  purposes. Baseline labs of PT/INR and aPTT have been ordered.   Goal of Therapy:  PTT 66-102 sec Monitor platelets by anticoagulation protocol: Yes   Plan:  -Heparin 3200 units BOLUS x 1 -Start heparin infusion at 650 units/hr -6 hour aPTT -Daily CBC/aPTT/HL -Monitor for bleeding -F/U CARDS plans, re-start Xarelto after heparin stops  Abran Duke, PharmD Clinical Pharmacist Phone: [phone] Pager: [phone] 08/31/2012 1:55 PM

## 2012-08-31 NOTE — ED Notes (Signed)
Results of troponin shown to Dr. Silverio Lay

## 2012-08-31 NOTE — ED Notes (Signed)
Called report to [place], RN unit 2900.

## 2012-08-31 NOTE — ED Notes (Signed)
Magnesium Sulfate started on patient when she began to have c/o increased shortness of breath and burning in her throat. Gtt was stopped and MD made aware. Patient had improvement post stopping of gtt.

## 2012-08-31 NOTE — Progress Notes (Signed)
RT assisted with transportation of PT from [facility] CDU 11 to [facility] 2921 - uneventful. Transported on 100% Fi02 on is now on 40% Fi02. RT caring for 2900 is at bedside with this PT.

## 2012-08-31 NOTE — Progress Notes (Signed)
Pt placed on BiPAP per MD order. Current settings are NIV/PS 10/5 and 40%. No complications noted. RT Will monitor.

## 2012-08-31 NOTE — Progress Notes (Signed)
ANTIBIOTIC CONSULT NOTE - INITIAL  Pharmacy Consult for vancomycin Indication: rule out pneumonia  Allergies  Allergen Reactions  . Levofloxacin Hives  . Penicillins Hives and Itching    Patient Measurements: Height: 5\' 4"  (162.6 cm) Weight: 121 lb (54.885 kg) IBW/kg (Calculated) : 54.7  Vital Signs: Temp: 97.6 F (36.4 C) (05/14 1122) Temp src: Oral (05/14 1122) BP: 111/65 mmHg (05/14 1611) Pulse Rate: 110 (05/14 1611) Intake/Output from previous day:   Intake/Output from this shift:    Labs:  Recent Labs  08/31/12 1303  WBC 15.8*  HGB 10.7*  PLT 306  CREATININE 0.66   Estimated Creatinine Clearance: 58.1 ml/min (by C-G formula based on Cr of 0.66). No results found for this basename: VANCOTROUGH, VANCOPEAK, VANCORANDOM, GENTTROUGH, GENTPEAK, GENTRANDOM, TOBRATROUGH, TOBRAPEAK, TOBRARND, AMIKACINPEAK, AMIKACINTROU, AMIKACIN,  in the last 72 hours   Microbiology: No results found for this or any previous visit (from the past 720 hour(s)).  Medical History: Past Medical History  Diagnosis Date  . Chronic combined systolic and diastolic CHF (congestive heart failure)     11/2011 - Echo: EF 40-45% w/ Gr 1 DD.  Marland Kitchen Deep vein thrombosis     Chronic Xarelto.  . Hyperlipidemia   . Sleep apnea   . 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 Coronary atherosclerosis of native coronary artery     a. reportedly s/p MI x 2;  b. March 2014 Cardiolite: EF 68% with scar affecting the inferolateral and anterolateral wall; mild ischemia in the mid-septum-> Med Rx.  . Chronic pain     Due to prior hip fracture and DDD back  . Peripheral vascular disease   . Pneumonia     "now and several times before" (07/26/2012)  . H/O hiatal hernia   . Stroke 2002; 2004    Denies residual; "both really light strokes" (07/26/2012)  . Arthritis     "all over" (07/26/2012)  . DDD (degenerative disc disease), lumbosacral   .  Depression   . Anxiety     Medications:  Anti-infectives   Start     Dose/Rate Route Frequency Ordered Stop   09/01/12 0430  vancomycin (VANCOCIN) 500 mg in sodium chloride 0.9 % 100 mL IVPB     500 mg 100 mL/hr over 60 Minutes Intravenous Every 12 hours 08/31/12 1622     08/31/12 1630  vancomycin (VANCOCIN) IVPB 1000 mg/200 mL premix     1,000 mg 200 mL/hr over 60 Minutes Intravenous  Once 08/31/12 1622     08/31/12 1215  doxycycline (VIBRA-TABS) tablet 100 mg     100 mg Oral  Once 08/31/12 1201 08/31/12 1323   08/31/12 1200  doxycycline (VIBRA-TABS) tablet 100 mg  Status:  Discontinued     100 mg Oral Every 12 hours 08/31/12 1146 08/31/12 1159     Assessment: 67 yof presented to the ED with SOB. To start empiric vancomycin and primaxin for possible PNA. Pt is currently afebrile but WBC is elevated at 15.8. She received a dose of oral doxycycline earlier today. Noted allergy to penicillin and levaquin. Pt has adequate renal function.   Goal of Therapy:  Vancomycin trough level 15-20 mcg/ml  Plan:  1. Vancomycin 1gm IV x 1 then 500mg  IV Q12H 2. Adjust primaxin to 250mg  IV Q6H - watch for possible cross-reactivity due to PCN allergy 3. F/u renal fxn, C&S, clinical status and trough at [facility]  Naveen Clardy, Drake Leach  08/31/2012,4:23 PM

## 2012-08-31 NOTE — ED Notes (Signed)
Patient was seen at [facility] on Monday for same symptoms and released the same day. Upon EMS arrival at patient's home she was finishing her Neb tx and had taken her Advair and spiriva with no improvement with breathing. EMS administered Albuterol Neb x3. Patient arrived shaky, shortness of breath and anxious.

## 2012-08-31 NOTE — Consult Note (Addendum)
Referring Physician:  Primary Physician: Aimee Heap, MD Primary Cardiologist:  Aimee Sanders Reason for Consultation:  SOB   HPI: Aimee Sanders is a 67 year old female with a history of CAD and CHF. She was seen in the [place] office on 3/27 and complained of weight gain but volume was felt to be OK. She was hospitalized in [place] in March 2014 with chest pain, Cardiolite results below in PMH. Pain was atypical and medical therapy recommended. She was hospitalized 4/8-4/10 at [facility] with PNA and COPD. She was a no-show for her echo and f/u appt with SM on 4/30. She came in today with SOB and was placed on BiPAP. Cardiology was asked to evaluate her.  Pt says has not had any chest pain. Her SOB began about a week ago. She went to [facility] and was treated/released from the ER for a URI. Levaquin broke her out so she was not on ABX but is on steroids and nebs. She has been coughing, productive of yellow sputum. This am, fairly suddenly, her SOB worsened. She came to the ER and feels better on BiPAP.  She has been given Doxycycline, nebs, IV steroids and Ativan. She has had some LE edema but not much. Her weight is the lowest it has been in > 6 months. She does not know if she has been having fevers. Of note, she has not smoked in 3 days.  Review of Systems:     Cardiac Review of Systems: {Y] = yes [ ]  = no  Chest Pain [    ]  Resting SOB [ y  ] Exertional SOB  [ y ]  Aimee Sanders  ]   Pedal Edema [  y ]    Palpitations [  ] Syncope  [  ]   Presyncope [   ]  General Review of Systems: [Y] = yes [  ]=no Constitional: recent weight change [  ]; anorexia [  ]; fatigue [  ]; nausea [  ]; night sweats [  ]; fever [ ?; or chills [  ];                                                                                                                                          Dental: poor dentition[y  ];    Eye : blurred vision [  ]; diplopia [   ]; vision changes [  ];  Amaurosis fugax[  ]; Resp: cough Sanders  ];  wheezing[ y  ];  hemoptysis[  ]; shortness of breath[y  ]; paroxysmal nocturnal dyspnea[ y ]; dyspnea on exertion[y  ]; or orthopnea[ y ];  GI:  gallstones[  ], vomiting[  ];  dysphagia[  ]; melena[  ];  hematochezia [  ]; heartburn[  ];    GU: kidney stones [  ]; hematuria[  ];   dysuria [  ];  nocturia[  ];  history of     obstruction [  ];                 Skin: rash, swelling[  ];, hair loss[  ];  peripheral edema[  ];  or itching[  ]; Musculosketetal: myalgias[  ];  joint swelling[  ];  joint erythema[  ];  joint pain[y  ];  back pain[  ];  Heme/Lymph: bruising[  ];  bleeding[  ];  anemia[  ];  Neuro: TIA[  ];  headaches[  ];  stroke[  ];  vertigo[  ];  seizures[  ];   paresthesias[  ];  difficulty walking[  ];  Psych:depression[  ]; anxiety[  ];  Endocrine: diabetes[  ];  thyroid dysfunction[  ];  Immunizations: Flu [  ]; Pneumococcal[  ];  Other:  Past Medical History  Diagnosis Date  . Chronic combined systolic and diastolic CHF (congestive heart failure)     11/2011 - Echo: EF 40-45% w/ Gr 1 DD.  Marland Kitchen Deep vein thrombosis     Chronic Xarelto.  . Hyperlipidemia   . Sleep apnea   . 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 Coronary atherosclerosis of native coronary artery     a. reportedly s/p MI x 2;  b. March 2014 Cardiolite: EF 68% with scar affecting the inferolateral and anterolateral wall; mild ischemia in the mid-septum-> Med Rx.  . Chronic pain     Due to prior hip fracture and DDD back  . Peripheral vascular disease   . Pneumonia     "now and several times before" (07/26/2012)  . H/O hiatal hernia   . Stroke 2002; 2004    Denies residual; "both really light strokes" (07/26/2012)  . Arthritis     "all over" (07/26/2012)  . DDD (degenerative disc disease), lumbosacral   . Depression   . Anxiety      (Not in a hospital admission)   . aspirin  325 mg Oral Once   Infusions:  . heparin 650 Units/hr (08/31/12 1510)     Allergies  Allergen Reactions  . Levofloxacin Hives  . Penicillins Hives and Itching    History   Social History  . Marital Status: Married    Spouse Name: N/A    Number of Children: N/A  . Years of Education: N/A   Occupational History  . Retired    Social History Main Topics  . Smoking status: Current Every Day Smoker -- 1.00 packs/day for 55 years    Types: Cigarettes  . Smokeless tobacco: Never Used  . Alcohol Use: No  . Drug Use: No  . Sexually Active: No   Other Topics Concern  . Not on file   Social History Narrative   Lives alone    Family History  Problem Relation Age of Onset  . Hypertension Mother   . Breast cancer Mother   . Rheum arthritis Maternal Grandmother   . Cancer Sister    Family Status  Relation Status Death Age  . Sister Alive     PHYSICAL EXAM: Filed Vitals:   08/31/12 1530  BP: 111/75  Pulse: 113  Temp:   Resp: 20   Wt Readings from Last 3 Encounters:  08/31/12 121 lb (54.885 kg)  07/28/12 132 lb 15 oz (60.3 kg)  07/14/12 129 lb (58.514 kg)    Intake/Output Summary (Last 24 hours) at 08/31/12 1538  Last data filed at 08/31/12 1218  Gross per 24 hour  Intake      0 ml  Output      0 ml  Net      0 ml    General:  Well appearing. Acute on chronic respiratory difficulty HEENT: normal Neck: supple. + JVD 8-10 cm. Carotids 2+ bilat; no bruits. No lymphadenopathy or thryomegaly appreciated. Cor: PMI nondisplaced. Regular rate & rhythm, but rapid. No rubs, gallops or murmurs. Lungs: bilateral rales with decreased breath sounds in bases Abdomen: soft, nontender, nondistended. No hepatosplenomegaly. No bruits or masses. Good bowel sounds. Extremities: no cyanosis, clubbing, rash, no edema Neuro: alert & oriented x 3, cranial nerves grossly intact. moves all 4 extremities w/o difficulty. Affect pleasant.  ECG: 31-Aug-2012 11:27:00  SINUS TACHYCARDIA ~ V-rate> 99 LAD, CONSIDER LEFT ANTERIOR FASCICULAR BLOCK ~  axis(240,-40), S>R II III aVF ANTERIOR INFARCT, OLD ~ Q >76mS, abnormal ST-T, V2-V5 Vent. rate 112 BPM PR interval 164 Aimee QRS duration 114 Aimee QT/QTc 296/404 Aimee P-R-T axes 96 -49 40  Radiology:  Results for orders placed during the hospital encounter of 08/31/12 (from the past 24 hour(s))  POCT I-STAT 3, BLOOD GAS (G3+)     Status: Abnormal    Collection Time     08/31/12 12:31 PM       Result Value Range    pH, Arterial 7.351  7.350 - 7.450    pCO2 arterial 59.2 (*) 35.0 - 45.0 mmHg    pO2, Arterial 54.0 (*) 80.0 - 100.0 mmHg    Bicarbonate 32.8 (*) 20.0 - 24.0 mEq/L    TCO2 35  0 - 100 mmol/L    O2 Saturation 85.0      Acid-Base Excess 6.0 (*) 0.0 - 2.0 mmol/L    Patient temperature 98.6 F      Collection site RADIAL, ALLEN'S TEST ACCEPTABLE      Sample type ARTERIAL      Comment NOTIFIED PHYSICIAN    CBC WITH DIFFERENTIAL     Status: Abnormal    Collection Time     08/31/12  1:03 PM       Result Value Range    WBC 15.8 (*) 4.0 - 10.5 K/uL    RBC 3.43 (*) 3.87 - 5.11 MIL/uL    Hemoglobin 10.7 (*) 12.0 - 15.0 g/dL    HCT 95.6 (*) 21.3 - 46.0 %    MCV 92.1  78.0 - 100.0 fL    MCH 31.2  26.0 - 34.0 pg    MCHC 33.9  30.0 - 36.0 g/dL    RDW 08.6  57.8 - 46.9 %    Platelets 306  150 - 400 K/uL    Neutrophils Relative % 91 (*) 43 - 77 %    Neutro Abs 14.4 (*) 1.7 - 7.7 K/uL    Lymphocytes Relative 5 (*) 12 - 46 %    Lymphs Abs 0.8  0.7 - 4.0 K/uL    Monocytes Relative 3  3 - 12 %    Monocytes Absolute 0.5  0.1 - 1.0 K/uL    Eosinophils Relative 0  0 - 5 %    Eosinophils Absolute 0.0  0.0 - 0.7 K/uL    Basophils Relative 0  0 - 1 %    Basophils Absolute 0.0  0.0 - 0.1 K/uL  BASIC METABOLIC PANEL     Status: Abnormal    Collection Time     08/31/12  1:03 PM  Result Value Range    Sodium 139  135 - 145 mEq/L    Potassium 4.2  3.5 - 5.1 mEq/L    Chloride 96  96 - 112 mEq/L    CO2 34 (*) 19 - 32 mEq/L    Glucose, Bld 138 (*) 70 - 99 mg/dL    BUN 10  6 - 23 mg/dL     Creatinine, Ser 1.61  0.50 - 1.10 mg/dL    Calcium 9.2  8.4 - 09.6 mg/dL    GFR calc non Af Amer 89 (*) >90 mL/min    GFR calc Af Amer >90  >90 mL/min  PRO B NATRIURETIC PEPTIDE     Status: Abnormal    Collection Time            Result Value Range   08/31/12  1:03 PM Pro B Natriuretic peptide (BNP) 2598.0 (*) 0 - 125 pg/mL   07/26/2012 Pro B Natriuretic peptide (BNP) 1873.0 (*)    POCT I-STAT TROPONIN I     Status: Abnormal    Collection Time     08/31/12  1:13 PM       Result Value Range    Troponin i, poc 0.21 (*) 0.00 - 0.08 ng/mL    Comment NOTIFIED PHYSICIAN      Comment 3            Dg Chest Portable 1 View 08/31/2012   *RADIOLOGY REPORT*  Clinical Data: COPD, sleep apnea, shortness of breath.  PORTABLE CHEST - 1 VIEW  Comparison: 08/29/2012.  Findings: Cardiomegaly again noted.  Mild perihilar interstitial prominence suspicious for pneumonitis or bronchitic changes.  No segmental infiltrate. Prior vertebroplasty again noted.  IMPRESSION: Cardiomegaly.  Mild perihilar interstitial prominence suspicious for pneumonitis or bronchitic changes.  No segmental infiltrate.   Original Report Authenticated By: Natasha Mead, M.D.    ASSESSMENT: Principal Problem: 1.  Respiratory failure, acute-on-chronic - feel multifactorial, see below. Active Problems: 2. SMOKER 3. COPD UNSPECIFIED 4. Acute on chronic combined systolic and diastolic congestive heart failure - will give IV Lasix x 1 dose and follow I/O. Foley cath as feel she is too SOB to get up to BR frequently. Have ordered daily BMET's to follow RF as well.  5. Elevated troponin   PLAN/DISCUSSION: 67 yo female with acute/chronic resp failure, likely multifactorial. She has some volume overload and this is contributing to her SOB but does not seem like her primary problem. Will give her 1 dose of Lasix 80 mg IV now. Continue to follow volume status but feel the treatment with nebs, abx and steroids is appropriate.  IM is seeing.    Aimee Demark, PA-C 08/31/2012 3:38 PM Beeper [phone]  Patient seen and examined with Aimee Demark, PA-C. We discussed all aspects of the encounter. I agree with the assessment and plan as stated above.   She has recurrent respiratory failure. Suspect mostly COPD flare but neck veins are up and CXR suggestive of mild CHF. Would give lasix 80 mg IV x 2 and then re-evaluate. Minimal troponin elevation likely due to HF or mild demand ischemia. Would not pursue further. Does not need systemic heparin. Treatment of COP per primary team.  Would hold b-blocker for now due to respiratory failure.   Conrado Nance,MD 4:07 PM

## 2012-08-31 NOTE — ED Provider Notes (Signed)
History     CSN: [id number]  Arrival date & time 08/31/12  1107   First MD Initiated Contact with Patient 08/31/12 1108      Chief Complaint  Patient presents with  . Shortness of Breath     HPI Aimee Sanders is a pleasant 67 year old woman with PMH of severe COPD, Gold stage 3, on 3L home O2, current smoker, CHF (last echo with EF 40-45%), hx of CVA, and hx of DVT, who presents to the [facility] via EMS for acute shortness of breath. She states that she has been sick with cough and increased sputum for about one week now. She was treated at the [facility] ED on Monday and discharged that same day. At that time she was given Levaquin for COPD exacerbation and she broke out in hives. Currently she is on prednisone taper (50mg  daily) at home and albuterol nebulizer as needed but not on antibiotics. She has history of DVT years ago with short term anticoagulation but denies history of PE.    Past Medical History  Diagnosis Date  . Chronic combined systolic and diastolic CHF (congestive heart failure)     11/2011 - Echo: EF 40-45% w/ Gr 1 DD.  Marland Kitchen Deep vein thrombosis     Chronic Xarelto.  . Hyperlipidemia   . Sleep apnea   . 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 Coronary atherosclerosis of native coronary artery     a. reportedly s/p MI x 2;  b. 2012 Myoview: EF 50% with a large lateral defect consistent with prior myocardial infarction-> Med Rx.  . Chronic pain     Due to prior hip fracture and DDD back  . Peripheral vascular disease   . Pneumonia     "now and several times before" (07/26/2012)  . H/O hiatal hernia   . Stroke 2002; 2004    Denies residual; "both really light strokes" (07/26/2012)  . Arthritis     "all over" (07/26/2012)  . DDD (degenerative disc disease), lumbosacral   . Depression   . Anxiety     Past Surgical History  Procedure Laterality Date  . Appendectomy    . Cholecystectomy    . Carpal tunnel  release Bilateral   . Coronary angioplasty with stent placement      "1; total of 1" (07/26/2012)  . Total hip arthroplasty Right   . Hernia repair      "stomach" (07/26/2012)  . Abdominal hysterectomy    . Fracture surgery    . Foot fracture surgery Left   . Breast surgery Bilateral     benign tumors  . Tumor excision      "stomach and LLE" (07/26/2012)  . Back surgery    . Fixation kyphoplasty lumbar spine      Family History  Problem Relation Age of Onset  . Hypertension Mother   . Breast cancer Mother   . Rheum arthritis Maternal Grandmother   . Cancer Sister     History  Substance Use Topics  . Smoking status: Current Every Day Smoker -- 1.00 packs/day for 55 years    Types: Cigarettes  . Smokeless tobacco: Never Used  . Alcohol Use: No    OB History   Grav Para Term Preterm Abortions TAB SAB Ect Mult Living                  Review of Systems  Constitutional: Negative  for fever, chills and diaphoresis.  HENT: Positive for sore throat.   Respiratory: Positive for cough, shortness of breath and wheezing.   Cardiovascular: Positive for leg swelling. Negative for chest pain and palpitations.       Right leg  Gastrointestinal: Positive for abdominal pain.       From coughing  Skin: Negative for color change, pallor, rash and wound.       Large bruise on left arm  Neurological: Positive for dizziness and headaches.  Hematological: Bruises/bleeds easily.  Psychiatric/Behavioral: Positive for agitation. Negative for behavioral problems and confusion.    Allergies  Levofloxacin and Penicillins  Home Medications   Current Outpatient Rx  Name  Route  Sig  Dispense  Refill  . albuterol (PROVENTIL) (2.5 MG/3ML) 0.083% nebulizer solution   Nebulization   Take 2.5 mg by nebulization every 6 (six) hours as needed for wheezing.         Marland Kitchen ALPRAZolam (XANAX) 0.5 MG tablet   Oral   Take 1 tablet (0.5 mg total) by mouth 4 (four) times daily as needed for anxiety.   120  tablet   0   . atorvastatin (LIPITOR) 40 MG tablet   Oral   Take 40 mg by mouth daily.         . benzonatate (TESSALON) 100 MG capsule   Oral   Take 200 mg by mouth 3 (three) times daily as needed for cough.         . bisoprolol (ZEBETA) 5 MG tablet   Oral   Take 2.5 mg by mouth daily.         Marland Kitchen buPROPion (WELLBUTRIN SR) 150 MG 12 hr tablet   Oral   Take 150 mg by mouth daily.         . citalopram (CELEXA) 20 MG tablet   Oral   Take 20 mg by mouth daily.         . fenofibrate 160 MG tablet   Oral   Take 160 mg by mouth daily.         . Fluticasone-Salmeterol (ADVAIR) 250-50 MCG/DOSE AEPB   Inhalation   Inhale 1 puff into the lungs every 12 (twelve) hours.         . folic acid (FOLVITE) 1 MG tablet   Oral   Take 1 mg by mouth daily.         . furosemide (LASIX) 80 MG tablet   Oral   Take 0.5 tablets (40 mg total) by mouth 2 (two) times daily.   60 tablet   1   . gabapentin (NEURONTIN) 300 MG capsule   Oral   Take 300 mg by mouth 5 (five) times daily.          Marland Kitchen ipratropium-albuterol (DUONEB) 0.5-2.5 (3) MG/3ML SOLN   Nebulization   Take 3 mLs by nebulization 3 (three) times daily. For shortness of breath or wheezing   360 mL   3   . isosorbide mononitrate (IMDUR) 30 MG 24 hr tablet   Oral   Take 30 mg by mouth daily.         . metoprolol tartrate (LOPRESSOR) 25 MG tablet   Oral   Take 25 mg by mouth 2 (two) times daily.         . montelukast (SINGULAIR) 10 MG tablet   Oral   Take 10 mg by mouth daily.         Marland Kitchen omeprazole (PRILOSEC) 20 MG capsule   Oral  Take 20 mg by mouth daily.         . ondansetron (ZOFRAN) 4 MG tablet   Oral   Take 4 mg by mouth every 6 (six) hours as needed for nausea.         Marland Kitchen oxyCODONE (OXY IR/ROXICODONE) 5 MG immediate release tablet   Oral   Take 1 tablet (5 mg total) by mouth every 4 (four) hours as needed.   15 tablet   0   . oxyCODONE-acetaminophen (PERCOCET) 10-325 MG per tablet    Oral   Take 1 tablet by mouth every 4 (four) hours as needed for pain.   120 tablet   0   . potassium chloride (K-DUR) 10 MEQ tablet   Oral   Take 10 mEq by mouth daily.         . Rivaroxaban (XARELTO) 20 MG TABS   Oral   Take 1 tablet (20 mg total) by mouth daily.   30 tablet   2   . tiotropium (SPIRIVA) 18 MCG inhalation capsule   Inhalation   Place 18 mcg into inhaler and inhale daily.         . nitroGLYCERIN (NITROSTAT) 0.4 MG SL tablet   Sublingual   Place 0.4 mg under the tongue every 5 (five) minutes as needed. For chest pain           BP 125/70  Pulse 119  Temp(Src) 97.6 F (36.4 C) (Oral)  Resp 33  Ht 5\' 3"  (1.6 m)  Wt 121 lb (54.885 kg)  BMI 21.44 kg/m2  SpO2 98%  LMP 07/08/1972  Physical Exam  Nursing note and vitals reviewed. Constitutional: She is oriented to person, place, and time. She appears well-developed. She appears distressed.  Thin, mild distress due to shortness of breath  HENT:  Mouth/Throat: Oropharynx is clear and moist. No oropharyngeal exudate.  Eyes: Conjunctivae are normal.  Cardiovascular: Regular rhythm.   Tachycardia, distant heart sounds  Pulmonary/Chest: She is in respiratory distress. She has wheezes. She exhibits no tenderness.  Decreased breath sounds with occasional expiratory wheezing, tachypnea  Increased work of breathing using accessory muscles   Abdominal: Soft. Bowel sounds are normal. She exhibits no distension and no mass. There is no tenderness. There is no rebound and no guarding.  Left lower quadrant ventral hernia that is reducible  Musculoskeletal: Normal range of motion. She exhibits edema. She exhibits no tenderness.  Right leg with increase in diameter and 2+ pitting edema. No calf erythema or tenderness.  Left leg with no edema, no calf tenderness  Neurological: She is alert and oriented to person, place, and time.  Skin: Skin is warm and dry. No rash noted. She is not diaphoretic. No erythema. No  pallor.  Bruise on left forearm  Psychiatric: She has a normal mood and affect. Her behavior is normal.    ED Course  Procedures (including critical care time)  Labs Reviewed  CBC WITH DIFFERENTIAL - Abnormal; Notable for the following:    WBC 15.8 (*)    RBC 3.43 (*)    Hemoglobin 10.7 (*)    HCT 31.6 (*)    Neutrophils Relative % 91 (*)    Neutro Abs 14.4 (*)    Lymphocytes Relative 5 (*)    All other components within normal limits  BASIC METABOLIC PANEL - Abnormal; Notable for the following:    CO2 34 (*)    Glucose, Bld 138 (*)    GFR calc non Af Amer 89 (*)  All other components within normal limits  PRO B NATRIURETIC PEPTIDE - Abnormal; Notable for the following:    Pro B Natriuretic peptide (BNP) 2598.0 (*)    All other components within normal limits  POCT I-STAT 3, BLOOD GAS (G3+) - Abnormal; Notable for the following:    pCO2 arterial 59.2 (*)    pO2, Arterial 54.0 (*)    Bicarbonate 32.8 (*)    Acid-Base Excess 6.0 (*)    All other components within normal limits  POCT I-STAT TROPONIN I - Abnormal; Notable for the following:    Troponin i, poc 0.21 (*)    All other components within normal limits  BLOOD GAS, ARTERIAL  PROTIME-INR  APTT   Dg Chest Portable 1 View  08/31/2012   *RADIOLOGY REPORT*  Clinical Data: COPD, sleep apnea, shortness of breath.  PORTABLE CHEST - 1 VIEW  Comparison: 08/29/2012.  Findings: Cardiomegaly again noted.  Mild perihilar interstitial prominence suspicious for pneumonitis or bronchitic changes.  No segmental infiltrate. Prior vertebroplasty again noted.  IMPRESSION: Cardiomegaly.  Mild perihilar interstitial prominence suspicious for pneumonitis or bronchitic changes.  No segmental infiltrate.   Original Report Authenticated By: Natasha Mead, M.D.     No diagnosis found.   Date: 08/31/2012  Rate: 112  Rhythm: normal sinus rhythm  QRS Axis: left  Intervals: normal  ST/T Wave abnormalities: normal  Conduction  Disutrbances:none  Narrative Interpretation:   Old EKG Reviewed: unchanged    MDM  67 yr old woman with PMH of severe COPD, CHF, presenting with shortness of breath. Was seen at [facility] ED on 5/12, on prednisone 50mg  daily, no improvement with nebs at home.   SOB: Suspect CHF exacerbation with COPD exacerbation. Ativan for possible anxiety but increased WOB persistent. BiPAP for respiratory comfort.  ABG with mild hypoxemia but Pulse Ox with sats >92%. Wells score of 3, PE r/o with CTA which is pending.   RLE edema: Pt with hx of DVT, if CTA negative may need Doppler but pt already on heparin drip.   Troponin elevated: to 0.21, no chest pain, started heparin drip, continue ASA. No EKG change is reassuring. Cardiology consulted.   Patient will be admitted to Hospitalist services for further management.       Ky Barban, MD 08/31/12 [phone]

## 2012-09-01 ENCOUNTER — Encounter (HOSPITAL_COMMUNITY): Payer: Self-pay | Admitting: *Deleted

## 2012-09-01 DIAGNOSIS — I369 Nonrheumatic tricuspid valve disorder, unspecified: Secondary | ICD-10-CM

## 2012-09-01 DIAGNOSIS — I5043 Acute on chronic combined systolic (congestive) and diastolic (congestive) heart failure: Secondary | ICD-10-CM | POA: Diagnosis present

## 2012-09-01 LAB — CBC
HCT: 28.4 % — ABNORMAL LOW (ref 36.0–46.0)
Hemoglobin: 9.6 g/dL — ABNORMAL LOW (ref 12.0–15.0)
MCV: 90.7 fL (ref 78.0–100.0)
RDW: 15.6 % — ABNORMAL HIGH (ref 11.5–15.5)
WBC: 4.7 10*3/uL (ref 4.0–10.5)

## 2012-09-01 LAB — BASIC METABOLIC PANEL
CO2: 30 mEq/L (ref 19–32)
Chloride: 95 mEq/L — ABNORMAL LOW (ref 96–112)
Creatinine, Ser: 0.6 mg/dL (ref 0.50–1.10)
Potassium: 3.8 mEq/L (ref 3.5–5.1)

## 2012-09-01 LAB — TROPONIN I: Troponin I: 0.66 ng/mL (ref <0.30)

## 2012-09-01 LAB — APTT: aPTT: 52 seconds — ABNORMAL HIGH (ref 24–37)

## 2012-09-01 LAB — CK TOTAL AND CKMB (NOT AT ARMC)
CK, MB: 5.9 ng/mL — ABNORMAL HIGH (ref 0.3–4.0)
Relative Index: INVALID (ref 0.0–2.5)

## 2012-09-01 MED ORDER — FUROSEMIDE 10 MG/ML IJ SOLN
40.0000 mg | Freq: Two times a day (BID) | INTRAMUSCULAR | Status: AC
  Administered 2012-09-01 (×2): 40 mg via INTRAVENOUS
  Filled 2012-09-01 (×2): qty 4

## 2012-09-01 MED ORDER — ALBUTEROL SULFATE (5 MG/ML) 0.5% IN NEBU
2.5000 mg | INHALATION_SOLUTION | Freq: Four times a day (QID) | RESPIRATORY_TRACT | Status: DC
  Administered 2012-09-01 – 2012-09-05 (×15): 2.5 mg via RESPIRATORY_TRACT
  Filled 2012-09-01 (×18): qty 0.5

## 2012-09-01 MED ORDER — HEPARIN (PORCINE) IN NACL 100-0.45 UNIT/ML-% IJ SOLN: 1000.0000 [IU]/h | INTRAMUSCULAR | Status: DC

## 2012-09-01 MED ORDER — FUROSEMIDE 40 MG PO TABS
40.0000 mg | ORAL_TABLET | Freq: Two times a day (BID) | ORAL | Status: DC
  Administered 2012-09-02: 40 mg via ORAL
  Filled 2012-09-01 (×2): qty 1

## 2012-09-01 MED ORDER — IPRATROPIUM BROMIDE 0.02 % IN SOLN
0.5000 mg | Freq: Four times a day (QID) | RESPIRATORY_TRACT | Status: DC
  Administered 2012-09-01 – 2012-09-05 (×15): 0.5 mg via RESPIRATORY_TRACT
  Filled 2012-09-01 (×18): qty 2.5

## 2012-09-01 MED ORDER — HEPARIN (PORCINE) IN NACL 100-0.45 UNIT/ML-% IJ SOLN: 800.0000 [IU]/h | INTRAMUSCULAR | Status: DC

## 2012-09-01 MED ORDER — METHYLPREDNISOLONE SODIUM SUCC 40 MG IJ SOLR
40.0000 mg | Freq: Four times a day (QID) | INTRAMUSCULAR | Status: DC
  Administered 2012-09-01 – 2012-09-02 (×4): 40 mg via INTRAVENOUS
  Filled 2012-09-01 (×7): qty 1

## 2012-09-01 MED ORDER — RIVAROXABAN 20 MG PO TABS
20.0000 mg | ORAL_TABLET | Freq: Every day | ORAL | Status: DC
  Administered 2012-09-01 – 2012-09-06 (×6): 20 mg via ORAL
  Filled 2012-09-01 (×6): qty 1

## 2012-09-01 NOTE — Progress Notes (Signed)
ANTICOAGULATION CONSULT NOTE - Follow Up Consult  Pharmacy Consult for Heparin Indication: hx DVT (Xarelto pta)  Allergies  Allergen Reactions  . Levofloxacin Hives  . Penicillins Hives and Itching    Patient Measurements: Height: 5\' 4"  (162.6 cm) Weight: 136 lb 3.9 oz (61.8 kg) IBW/kg (Calculated) : 54.7 Heparin Dosing Weight: 61.8kg  Vital Signs: Temp: 97.1 F (36.2 C) (05/15 0800) Temp src: Oral (05/15 0800) BP: 105/50 mmHg (05/15 1000) Pulse Rate: 95 (05/15 1000)  Labs:  Recent Labs  08/31/12 1303 08/31/12 1421 08/31/12 1622 08/31/12 2226 08/31/12 2227 09/01/12 0844  HGB 10.7*  --   --   --   --  9.6*  HCT 31.6*  --   --   --   --  28.4*  PLT 306  --   --   --   --  196  APTT  --  27  --  49*  --  52*  LABPROT  --  13.5  --   --   --   --   INR  --  1.04  --   --   --   --   HEPARINUNFRC  --   --   --   --   --  0.66  CREATININE 0.66  --   --   --   --  0.60  TROPONINI  --   --  0.73*  --  1.02*  --     Estimated Creatinine Clearance: 58.1 ml/min (by C-G formula based on Cr of 0.6).   Medications:  Heparin @ 800 units/hr  Assessment: 67yof on xarelto pta for hx DVT, admitted with CP/elevated troponins and switched to IV heparin. Last dose of xarelto was 5/14 @ 0930 per patient's son.   APTT remains subtherapeutic despite rate increase this morning. Heparin level is at goal, but is still probably influenced by xarelto since it has only been ~24 hours since last dose.  Hgb/Hct/Plts have all dropped significantly. Spoke to [employer] and she is ok with continuing heparin for now given that her platelets are still near her baseline (190-200). May discontinue heparin later anyway if troponins trend down.  No bleeding reported.  Goal of Therapy:  Heparin level 0.3-0.7 units/ml APTT 66-102 seconds Monitor platelets by anticoagulation protocol: Yes   Plan:  1) Increase heparin to 1000 units/hr 2) Check 6 hour aPTT and heparin level   Fredrik Rigger 09/01/2012,10:45 AM

## 2012-09-01 NOTE — Progress Notes (Signed)
Patient Name: Aimee Sanders Date of Encounter: 09/01/2012     SUBJECTIVE: Breathing some better, abdomen still enlarged, SOB not close to baseline.  OBJECTIVE Filed Vitals:   09/01/12 0439 09/01/12 0500 09/01/12 0800 09/01/12 0936  BP: 102/62  103/61   Pulse: 93  87   Temp:   97.1 F (36.2 C)   TempSrc:   Oral   Resp: 16  21   Height:      Weight:  136 lb 3.9 oz (61.8 kg)    SpO2: 98%  95% 95%    Intake/Output Summary (Last 24 hours) at 09/01/12 0941 Last data filed at 09/01/12 0800  Gross per 24 hour  Intake 596.43 ml  Output    675 ml  Net -78.57 ml   Filed Weights   08/31/12 1122 08/31/12 1835 09/01/12 0500  Weight: 121 lb (54.885 kg) 128 lb 4.9 oz (58.2 kg) 136 lb 3.9 oz (61.8 kg)    PHYSICAL EXAM General: Well developed, well nourished, female in mild respiratory distress. Head: Normocephalic, atraumatic.  Neck: Supple without bruits, JVD 11 cm. Lungs:  Resp regular and slightly labored, decreased breath sounds bases with some crackles. Heart: RRR, S1, S2, no S3, S4, or murmur; no rub. Abdomen: Soft, non-tender, non-distended, BS + x 4.  Extremities: No clubbing, cyanosis, no edema.  Neuro: Alert and oriented X 3. Moves all extremities spontaneously. Psych: Normal affect.  LABS: CBC: Recent Labs  08/31/12 1303 09/01/12 0844  WBC 15.8* 4.7  NEUTROABS 14.4*  --   HGB 10.7* 9.6*  HCT 31.6* 28.4*  MCV 92.1 90.7  PLT 306 196   INR: Recent Labs  08/31/12 1421  INR 1.04   Basic Metabolic Panel: Recent Labs  08/31/12 1303  NA 139  K 4.2  CL 96  CO2 34*  GLUCOSE 138*  BUN 10  CREATININE 0.66  CALCIUM 9.2   Cardiac Enzymes: Recent Labs  08/31/12 1622 08/31/12 2227  TROPONINI 0.73* 1.02*    Recent Labs  08/31/12 1313  TROPIPOC 0.21*   BNP: Pro B Natriuretic peptide (BNP)  Date/Time Value Range Status  08/31/2012  1:03 PM 2598.0* 0 - 125 pg/mL Final  07/26/2012  4:16 PM 1873.0* 0 - 125 pg/mL Final    TELE:   SR, ST     ECG:  01-Sep-2012 07:51:39   Normal sinus rhythm Left axis deviation ST & T wave abnormality, consider inferior ischemia ST & T wave abnormality, consider anterior ischemia Abnormal ECG Vent. rate 88 BPM PR interval 152 ms QRS duration 82 ms QT/QTc 408/493 ms P-R-T axes 74 -47 -85  Radiology/Studies: Ct Angio Chest Pe W/cm &/or Wo Cm 08/31/2012   *RADIOLOGY REPORT*  Clinical Data: Shortness of breath, COPD  CT ANGIOGRAPHY CHEST  Technique:  Multidetector CT imaging of the chest using the standard protocol during bolus administration of intravenous contrast. Multiplanar reconstructed images including MIPs were obtained and reviewed to evaluate the vascular anatomy.  Contrast: 80mL OMNIPAQUE IOHEXOL 350 MG/ML SOLN  Comparison: 08/31/2012 radiograph, 02/08/2012 CT  Findings: Respiratory motion degrades evaluation of the lower lobe segmental branches.  Otherwise, no pulmonary arterial branch filling defect is identified.  Normal caliber aorta with scattered atherosclerotic disease.  Heart size mildly enlarged.  Coronary artery calcification. Leftward mediastinal shift.  No intrathoracic lymphadenopathy.  Limited images through the upper abdomen show no acute finding.  Central airways are patent with mild lower lobe peribronchial thickening.  Mild bibasilar opacities.  No pneumothorax. Centrolobular emphysema.  Apical scarring.  Detailed parenchymal evaluation of the lung bases degraded by respiratory motion.  Diffuse osteopenia and multilevel degenerative changes.  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  IMPRESSION: Degraded by respiratory motion which obscures evaluation of the lower lobe segmental branches.  Allowing for this, no pulmonary embolism identified.  Cardiomegaly with coronary artery calcification.  COPD and bibasilar opacities; scarring, atelectasis, or infiltrate.   Original Report Authenticated By: Jearld Lesch, M.D.   Dg Chest Portable 1  View 08/31/2012   *RADIOLOGY REPORT*  Clinical Data: COPD, sleep apnea, shortness of breath.  PORTABLE CHEST - 1 VIEW  Comparison: 08/29/2012.  Findings: Cardiomegaly again noted.  Mild perihilar interstitial prominence suspicious for pneumonitis or bronchitic changes.  No segmental infiltrate. Prior vertebroplasty again noted.  IMPRESSION: Cardiomegaly.  Mild perihilar interstitial prominence suspicious for pneumonitis or bronchitic changes.  No segmental infiltrate.   Original Report Authenticated By: Natasha Mead, M.D.     Current Medications:  . ipratropium  0.5 mg Nebulization Q4H   And  . albuterol  2.5 mg Nebulization Q4H  . antiseptic oral rinse  15 mL Mouth Rinse BID  . aspirin  325 mg Oral Once  . atorvastatin  40 mg Oral Daily  . buPROPion  150 mg Oral Daily  . citalopram  20 mg Oral Daily  . fenofibrate  160 mg Oral Daily  . folic acid  1 mg Oral Daily  . furosemide  80 mg Intravenous Once  . furosemide  40 mg Oral BID  . gabapentin  300 mg Oral 5 X Daily  . imipenem-cilastatin  250 mg Intravenous Q6H  . methylPREDNISolone (SOLU-MEDROL) injection  80 mg Intravenous Q6H  . mometasone-formoterol  2 puff Inhalation BID  . montelukast  10 mg Oral Daily  . pantoprazole  40 mg Oral Daily  . sodium chloride  3 mL Intravenous Q12H  . vancomycin  500 mg Intravenous Q12H   . heparin 800 Units/hr (09/01/12 0003)    ASSESSMENT AND PLAN: 67 yo female with hx CAD, CHF and O2 dependent COPD was admitted 5/14 with resp failure req. BiPAP.    Acute on chronic combined systolic and diastolic congestive heart failure, NYHA class 4 - will continue the IV Lasix today at 40 mg IV BID, possible change to PO Rx in am. Feel she still has some extra volume on board.     Elevated troponin - still trending up last pm, will check this am and continue to cycle until trending down. She has Hx CAD but no ischemic symptoms. Elevation may be entirely related to demand ischemia (pt had mild ischemia on  Cardiolite 06/2012). Treat medically for now with ASA, statin, add back BB when resp status improved. Add nitrates for symptoms. OK to D/C heparin once enzymes trending down. Recheck ECG as baseline wander makes eval difficult.    Respiratory failure, acute-on-chronic    SMOKER     COPD exacerbation  Signed, Theodore Demark , PA-C 9:41 AM 09/01/2012 Patient seen and examined. I agree with the assessment and plan as detailed above. See also my additional thoughts below.   I have reviewed the prior notes. I have reviewed and noted above and made adjustments to it. From the cardiac viewpoint the patient is stable.we will follow the troponins further, but the overall plan at this point is not to proceed  With more aggressive cardiac workup. We will continue to watch her volume status with diuretics and her renal function.  Willa Rough, MD, [facility] 09/01/2012 10:39  AM

## 2012-09-01 NOTE — Progress Notes (Addendum)
[facility] Progress Note [place] TEAM 1 - Stepdown/ICU TEAM   Aimee Sanders UJW:[id number] DOB: 1945-10-18 DOA: 08/31/2012 PCP: Rudi Heap, MD  Brief narrative: 67 year old female with a history of COPD with continued tobacco use, on 3 L nasal cannula at home, DVT on xarelto, hyperlipidemia, and coronary artery disease presents with 4 days of worsening shortness of breath. Patient has been complaining of a nonproductive cough without any fevers or chills. Unfortunately the patient's history has been limited due to her extremis at this time. She does complain of nausea without any vomiting. She has been complaining of chest discomfort at rest for the last 2 days. She denies any fevers, chills abdominal pain, dysuria, hematuria, hemoptysis. The patient continues to smoke 3-4 cigarettes per day. She has a history of 80 pack years. The patient endorses compliance with her medications. Of note, the patient was recently admitted between 07/26/12 and 07/28/12 for pneumococcal PNA. Her urine streptococcal pneumonia antigen was positive. The patient was discharged with levofloxacin.  In the emergency department, the patient was noted to have some distress. The patient was given furosemide 80 mg IV x1, Solu-Medrol 125mg  with aerosolized albuterol and Atrovent. The patient was placed on BiPAP. ABG showed 7.35/59/54/32. The patient was given Ativan 1 mg IV and morphine 2 mg IV which helped with her respirations. WBC was noted to be 15.8. BMP was unremarkable. ProBNP was 2598 and troponin was elevated at 0.21. Chest x-ray showed mild increased perihilar interstitial prominence concerning for pneumonitis.    Assessment/Plan: Acute on chronic respiratory failure-  - on 3 L O2 at baseline -likely COPD exacerbation along with chf exacerbation- EF 45 % - coughing up a scant amount of white mucous therefore, may not be infectious- follow on current antibiotics for now -Continue BiPAP PRN -Aerosolized  albuterol and Atrovent  -taper  Solu-Medrol to 40 mg IV every 6 hours - diuresis per cardiology   Leukocytosis/pulmonary interstitial infiltrates  -Blood cultures x2 sets  - empiric vancomycin and imipenem  -Patient has a rash to PCN   Elevated troponin  -Cardiology eval appreciated -d/c heparin per Card recommendations as troponins are decreasing- resume xarelto   Hypertension  -Hold beta blocker and Imdur  at this time as the patient's blood pressure is soft   Anxiety  -Patient takes Xanax 0.5 mg every 6 hours when necessary anxiety  -Continue at this time   Tobacco abuse  -Tobacco cessation discussed   History of DVT lower extremities  -Patient was placed on heparin drip do to elevated troponin  -resuming xarelto today after discontinuing Heparin     Code Status: full code Family Communication: none Disposition Plan: follow in SDU  Consultants: cards  Procedures: none  Antibiotics: Vanc/ Imipenem, 5/15  DVT prophylaxis: Xarelto  HPI/Subjective: Pt feeling somewhat better, no dyspnea. Mid cough with white sputum.    Objective: Blood pressure 99/51, pulse 93, temperature 98.8 F (37.1 C), temperature source Oral, resp. rate 17, height 5\' 4"  (1.626 m), weight 61.8 kg (136 lb 3.9 oz), last menstrual period 07/08/1972, SpO2 96.00%.  Intake/Output Summary (Last 24 hours) at 09/01/12 1611 Last data filed at 09/01/12 1600  Gross per 24 hour  Intake 1072.43 ml  Output   1425 ml  Net -352.57 ml     Exam: General: No acute respiratory distress Lungs: Clear to auscultation bilaterally without wheezes or crackles Cardiovascular: Regular rate and rhythm without murmur gallop or rub normal S1 and S2 Abdomen: Nontender, nondistended, soft, bowel sounds positive, no  rebound, no ascites, no appreciable mass Extremities: No significant cyanosis, clubbing, or edema bilateral lower extremities  Data Reviewed: Basic Metabolic Panel:  Recent Labs Lab  08/31/12 1303 09/01/12 0844  NA 139 137  K 4.2 3.8  CL 96 95*  CO2 34* 30  GLUCOSE 138* 128*  BUN 10 12  CREATININE 0.66 0.60  CALCIUM 9.2 8.9   Liver Function Tests: No results found for this basename: AST, ALT, ALKPHOS, BILITOT, PROT, ALBUMIN,  in the last 168 hours No results found for this basename: LIPASE, AMYLASE,  in the last 168 hours No results found for this basename: AMMONIA,  in the last 168 hours CBC:  Recent Labs Lab 08/31/12 1303 09/01/12 0844  WBC 15.8* 4.7  NEUTROABS 14.4*  --   HGB 10.7* 9.6*  HCT 31.6* 28.4*  MCV 92.1 90.7  PLT 306 196   Cardiac Enzymes:  Recent Labs Lab 08/31/12 1622 08/31/12 2227 09/01/12 0844  CKTOTAL  --   --  69  CKMB  --   --  5.9*  TROPONINI 0.73* 1.02* 0.66*   BNP (last 3 results)  Recent Labs  06/22/12 2005 07/26/12 1616 08/31/12 1303  PROBNP 3562.0* 1873.0* 2598.0*   CBG: No results found for this basename: GLUCAP,  in the last 168 hours  Recent Results (from the past 240 hour(s))  MRSA PCR SCREENING     Status: None   Collection Time    08/31/12  6:34 PM      Result Value Range Status   MRSA by PCR NEGATIVE  NEGATIVE Final   Comment:            The GeneXpert MRSA Assay (FDA     approved for NASAL specimens     only), is one component of a     comprehensive MRSA colonization     surveillance program. It is not     intended to diagnose MRSA     infection nor to guide or     monitor treatment for     MRSA infections.     Studies:  Recent x-ray studies have been reviewed in detail by the Attending Physician  Scheduled Meds:  Scheduled Meds: . ipratropium  0.5 mg Nebulization Q6H   And  . albuterol  2.5 mg Nebulization Q6H  . antiseptic oral rinse  15 mL Mouth Rinse BID  . aspirin  325 mg Oral Once  . atorvastatin  40 mg Oral Daily  . buPROPion  150 mg Oral Daily  . citalopram  20 mg Oral Daily  . fenofibrate  160 mg Oral Daily  . folic acid  1 mg Oral Daily  . furosemide  40 mg  Intravenous BID  . furosemide  80 mg Intravenous Once  . [START ON 09/02/2012] furosemide  40 mg Oral BID  . gabapentin  300 mg Oral 5 X Daily  . imipenem-cilastatin  250 mg Intravenous Q6H  . methylPREDNISolone (SOLU-MEDROL) injection  80 mg Intravenous Q6H  . mometasone-formoterol  2 puff Inhalation BID  . montelukast  10 mg Oral Daily  . pantoprazole  40 mg Oral Daily  . sodium chloride  3 mL Intravenous Q12H  . vancomycin  500 mg Intravenous Q12H   Continuous Infusions: . heparin 1,000 Units/hr (09/01/12 1100)    Time spent on care of this patient: 35 min   Calvert Cantor, MD [phone]  [facility] Office  [phone] Pager - Text Page per Loretha Stapler as per below:  On-Call/Text Page:  [email or web address]      password [email or web address]  If 7PM-7AM, please contact night-coverage [email or web address] Password [email or web address] 09/01/2012, 4:11 PM   LOS: 1 day

## 2012-09-01 NOTE — Progress Notes (Signed)
UR complete.  Renetta Suman RN, MSN 

## 2012-09-02 LAB — BASIC METABOLIC PANEL
CO2: 35 mEq/L — ABNORMAL HIGH (ref 19–32)
Chloride: 94 mEq/L — ABNORMAL LOW (ref 96–112)
Glucose, Bld: 131 mg/dL — ABNORMAL HIGH (ref 70–99)
Potassium: 3.9 mEq/L (ref 3.5–5.1)
Sodium: 136 mEq/L (ref 135–145)

## 2012-09-02 LAB — CBC
Hemoglobin: 9.8 g/dL — ABNORMAL LOW (ref 12.0–15.0)
MCH: 31 pg (ref 26.0–34.0)
Platelets: 193 10*3/uL (ref 150–400)
RBC: 3.16 MIL/uL — ABNORMAL LOW (ref 3.87–5.11)
WBC: 4.6 10*3/uL (ref 4.0–10.5)

## 2012-09-02 MED ORDER — FUROSEMIDE 40 MG PO TABS
40.0000 mg | ORAL_TABLET | Freq: Two times a day (BID) | ORAL | Status: DC
  Administered 2012-09-03 – 2012-09-06 (×7): 40 mg via ORAL
  Filled 2012-09-02 (×9): qty 1

## 2012-09-02 MED ORDER — ALPRAZOLAM 0.5 MG PO TABS
0.5000 mg | ORAL_TABLET | Freq: Four times a day (QID) | ORAL | Status: DC | PRN
  Administered 2012-09-02 – 2012-09-05 (×9): 0.5 mg via ORAL
  Filled 2012-09-02 (×9): qty 1

## 2012-09-02 MED ORDER — ALPRAZOLAM 0.5 MG PO TABS
0.5000 mg | ORAL_TABLET | Freq: Three times a day (TID) | ORAL | Status: DC | PRN
  Administered 2012-09-02: 0.5 mg via ORAL
  Filled 2012-09-02: qty 1

## 2012-09-02 MED ORDER — METHYLPREDNISOLONE SODIUM SUCC 125 MG IJ SOLR
60.0000 mg | Freq: Three times a day (TID) | INTRAMUSCULAR | Status: DC
  Administered 2012-09-02 – 2012-09-04 (×5): 60 mg via INTRAVENOUS
  Filled 2012-09-02 (×8): qty 0.96

## 2012-09-02 NOTE — Progress Notes (Signed)
Patient ID: Aimee Sanders, female   DOB: [birth year], 67 y.o.   MRN: [id number]   SUBJECTIVE: Patient feels well today. She is lying flat in bed. Her overall cardiac status is stable.   Filed Vitals:   09/02/12 0341 09/02/12 0440 09/02/12 0630 09/02/12 0730  BP: 107/60   95/55  Pulse: 73  104 69  Temp:    97.8 F (36.6 C)  TempSrc:    Oral  Resp: 18  19 20   Height:      Weight:  138 lb 10.7 oz (62.9 kg)    SpO2: 100%  95% 99%    Intake/Output Summary (Last 24 hours) at 09/02/12 0825 Last data filed at 09/02/12 0620  Gross per 24 hour  Intake   1026 ml  Output   1500 ml  Net   -474 ml    LABS: Basic Metabolic Panel:  Recent Labs  40/98/11 0844 09/02/12 0345  NA 137 136  K 3.8 3.9  CL 95* 94*  CO2 30 35*  GLUCOSE 128* 131*  BUN 12 15  CREATININE 0.60 0.75  CALCIUM 8.9 8.8   Liver Function Tests: No results found for this basename: AST, ALT, ALKPHOS, BILITOT, PROT, ALBUMIN,  in the last 72 hours No results found for this basename: LIPASE, AMYLASE,  in the last 72 hours CBC:  Recent Labs  08/31/12 1303 09/01/12 0844 09/02/12 0345  WBC 15.8* 4.7 4.6  NEUTROABS 14.4*  --   --   HGB 10.7* 9.6* 9.8*  HCT 31.6* 28.4* 29.4*  MCV 92.1 90.7 93.0  PLT 306 196 193   Cardiac Enzymes:  Recent Labs  08/31/12 1622 08/31/12 2227 09/01/12 0844  CKTOTAL  --   --  69  CKMB  --   --  5.9*  TROPONINI 0.73* 1.02* 0.66*   BNP: No components found with this basename: POCBNP,  D-Dimer: No results found for this basename: DDIMER,  in the last 72 hours Hemoglobin A1C: No results found for this basename: HGBA1C,  in the last 72 hours Fasting Lipid Panel: No results found for this basename: CHOL, HDL, LDLCALC, TRIG, CHOLHDL, LDLDIRECT,  in the last 72 hours Thyroid Function Tests: No results found for this basename: TSH, T4TOTAL, FREET3, T3FREE, THYROIDAB,  in the last 72 hours  RADIOLOGY: Ct Angio Chest Pe W/cm &/or Wo Cm  08/31/2012   *RADIOLOGY REPORT*  Clinical  Data: Shortness of breath, COPD  CT ANGIOGRAPHY CHEST  Technique:  Multidetector CT imaging of the chest using the standard protocol during bolus administration of intravenous contrast. Multiplanar reconstructed images including MIPs were obtained and reviewed to evaluate the vascular anatomy.  Contrast: 80mL OMNIPAQUE IOHEXOL 350 MG/ML SOLN  Comparison: 08/31/2012 radiograph, 02/08/2012 CT  Findings: Respiratory motion degrades evaluation of the lower lobe segmental branches.  Otherwise, no pulmonary arterial branch filling defect is identified.  Normal caliber aorta with scattered atherosclerotic disease.  Heart size mildly enlarged.  Coronary artery calcification. Leftward mediastinal shift.  No intrathoracic lymphadenopathy.  Limited images through the upper abdomen show no acute finding.  Central airways are patent with mild lower lobe peribronchial thickening.  Mild bibasilar opacities.  No pneumothorax. Centrolobular emphysema.  Apical scarring.  Detailed parenchymal evaluation of the lung bases degraded by respiratory motion.  Diffuse osteopenia and multilevel degenerative changes.  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  IMPRESSION: Degraded by respiratory motion which obscures evaluation of the lower lobe segmental branches.  Allowing for this, no pulmonary embolism identified.  Cardiomegaly  with coronary artery calcification.  COPD and bibasilar opacities; scarring, atelectasis, or infiltrate.   Original Report Authenticated By: Jearld Lesch, M.D.   Dg Chest Portable 1 View  08/31/2012   *RADIOLOGY REPORT*  Clinical Data: COPD, sleep apnea, shortness of breath.  PORTABLE CHEST - 1 VIEW  Comparison: 08/29/2012.  Findings: Cardiomegaly again noted.  Mild perihilar interstitial prominence suspicious for pneumonitis or bronchitic changes.  No segmental infiltrate. Prior vertebroplasty again noted.  IMPRESSION: Cardiomegaly.  Mild perihilar interstitial prominence  suspicious for pneumonitis or bronchitic changes.  No segmental infiltrate.   Original Report Authenticated By: Natasha Mead, M.D.    PHYSICAL EXAM  Patient is lying flat in bed. She is stable. She is in good spirits. Lungs reveal scattered rhonchi. Cardiac exam reveals S1 and S2. She has no significant peripheral edema.   TELEMETRY: I have reviewed telemetry today Sep 02, 2012. There is sinus rhythm. There are no significant arrhythmias.   ASSESSMENT AND PLAN:    Respiratory failure, acute-on-chronic    COPD exacerbation    Elevated troponin       She has known coronary disease. The plan is to watch her medically. No further aggressive cardiac workup is planned at this time.    Acute on chronic combined systolic and diastolic CHF (congestive heart failure)    Her volume status is stable. No plan to change her meds today.   Willa Rough 09/02/2012 8:25 AM

## 2012-09-02 NOTE — Progress Notes (Addendum)
[facility] Progress Note Aimee Sanders TEAM 1 - Stepdown/ICU TEAM   Aimee Sanders ZOX:[id number] DOB: 12-13-1945 DOA: 08/31/2012 PCP: Rudi Heap, MD  Brief narrative: 67 year old female with a history of COPD with continued tobacco use, on 3 L nasal cannula at home, DVT on xarelto, hyperlipidemia, and coronary artery disease who presented with 4 days of worsening shortness of breath. Patient had been complaining of a nonproductive cough without fevers or chills. She had also been complaining of chest discomfort at rest for 2 days. She denied any fevers, chills abdominal pain, dysuria, hematuria, hemoptysis. The patient continues to smoke 3-4 cigarettes per day. She has a history of 80 pack years. The patient endorsed compliance with her medications. Of note, the patient was recently admitted between 07/26/12 and 07/28/12 for pneumococcal PNA. Her urine streptococcal pneumonia antigen was positive. The patient was discharged with levofloxacin.  In the emergency department, the patient was noted to have some distress. The patient was given furosemide 80 mg IV x1, Solu-Medrol 125mg  with aerosolized albuterol and atrovent. The patient was placed on BiPAP. ABG showed 7.35/59/54/32. The patient was given Ativan 1 mg IV and morphine 2 mg IV which helped with her respirations. WBC was noted to be 15.8. BMP was unremarkable. ProBNP was 2598 and troponin was elevated at 0.21. Chest x-ray showed mild increased perihilar interstitial prominence concerning for pneumonitis.    Assessment/Plan:  Acute on chronic respiratory failure due to: A) - acute COPD exac B) - acute CHF exac  -see individual issues discussed below  Acute bronchospastic COPD ecacerbation  Home O2 dependent at 3LPM at all times - slowly responding to bronchodilators, empiric abx, and steroids - counseled on need to d/c tobacco entirely    Bibasilar pulmonary interstitial infiltrates / B hilar prominence - ?CAP vs HCAP vs/ atx w/  bronchitis -Blood cultures x2 sets  -empiric vancomycin and imipenem  -patient develops a rash with PCN   Acute on chronic combined systolic and diastolic CHF  11/2011 - Echo: EF 40-45% w/ Gr 1 DD - no signif change on TTE this admit - care as per Cardiology - appears to be maximally diuresed at this time - No ACE/ARB at this time due to relative hypotension  Mildly Elevated troponin w/ known CAD -care as per Cardiology - no further acute evaluation planned    Current Hypotension with hx of Hypertension  -Hold beta blocker and Imdur at this time as the patient's blood pressure is soft - no further diuresis for now - follow trend   Anxiety  -Patient takes Xanax 0.5 mg every 6 hours when necessary  -Continue at this time   Tobacco abuse  -Tobacco cessation discussed   History of DVT lower extremities  -Patient was placed on heparin drip due to elevated troponin at time of admit  -resumed xarelto after discontinuing Heparin  Microcytic anemia Likely due to chronic disease - Hgb was 10.6 in October 2013 - was guiac negative August 2013 - will check anemia panel in AM  Code Status: FULL Family Communication: none Disposition Plan: transfer to tele bed   Consultants: Cardiology   Procedures: 5/15 - TTE - Inferolateral hypokinesis - mild LVH - ejection fraction was 45% - left ventricular diastolic dysfunction  Antibiotics: Vanc 5/14 >> Primaxin 5/14 >>  DVT prophylaxis: Xarelto  HPI/Subjective: Pt does not feel that she has improved significantly since yesterday.  She continues to c/o sob, as well as L lateral chest wall pain that today sounds pleuritic in nature.  She denies f/c, n/v, or abdom pain.  Objective: Blood pressure 93/51, pulse 86, temperature 97.9 F (36.6 C), temperature source Oral, resp. rate 20, height 5\' 4"  (1.626 m), weight 62.9 kg (138 lb 10.7 oz), last menstrual period 07/08/1972, SpO2 98.00%.  Intake/Output Summary (Last 24 hours) at 09/02/12  1451 Last data filed at 09/02/12 1300  Gross per 24 hour  Intake   1120 ml  Output   1450 ml  Net   -330 ml   Exam: General: able to complete full sentences w/o pause - mild resp distress  Lungs: poor air movement th/o all fields w/ mild exp wheeze - distant BS Cardiovascular: distant heart sounds - no appreciable M, G, R - RRR Abdomen: Nontender, nondistended, soft, bowel sounds positive, no rebound, no ascites, no appreciable mass Extremities: No significant cyanosis, clubbing, or edema bilateral lower extremities  Data Reviewed: Basic Metabolic Panel:  Recent Labs Lab 08/31/12 1303 09/01/12 0844 09/02/12 0345  NA 139 137 136  K 4.2 3.8 3.9  CL 96 95* 94*  CO2 34* 30 35*  GLUCOSE 138* 128* 131*  BUN 10 12 15   CREATININE 0.66 0.60 0.75  CALCIUM 9.2 8.9 8.8   CBC:  Recent Labs Lab 08/31/12 1303 09/01/12 0844 09/02/12 0345  WBC 15.8* 4.7 4.6  NEUTROABS 14.4*  --   --   HGB 10.7* 9.6* 9.8*  HCT 31.6* 28.4* 29.4*  MCV 92.1 90.7 93.0  PLT 306 196 193   Cardiac Enzymes:  Recent Labs Lab 08/31/12 1622 08/31/12 2227 09/01/12 0844  CKTOTAL  --   --  69  CKMB  --   --  5.9*  TROPONINI 0.73* 1.02* 0.66*   BNP (last 3 results)  Recent Labs  06/22/12 2005 07/26/12 1616 08/31/12 1303  PROBNP 3562.0* 1873.0* 2598.0*    Recent Results (from the past 240 hour(s))  MRSA PCR SCREENING     Status: None   Collection Time    08/31/12  6:34 PM      Result Value Range Status   MRSA by PCR NEGATIVE  NEGATIVE Final   Comment:            The GeneXpert MRSA Assay (FDA     approved for NASAL specimens     only), is one component of a     comprehensive MRSA colonization     surveillance program. It is not     intended to diagnose MRSA     infection nor to guide or     monitor treatment for     MRSA infections.  CULTURE, BLOOD (ROUTINE X 2)     Status: None   Collection Time    08/31/12 10:26 PM      Result Value Range Status   Specimen Description BLOOD RIGHT  HAND   Final   Special Requests     Final   Value: BOTTLES DRAWN AEROBIC ONLY 10CC PT ON PRIMAXIN ,DOXYCYCLINE   Culture  Setup Time 09/01/2012 05:42   Final   Culture     Final   Value:        BLOOD CULTURE RECEIVED NO GROWTH TO DATE CULTURE WILL BE HELD FOR 5 DAYS BEFORE ISSUING A FINAL NEGATIVE REPORT   Report Status PENDING   Incomplete  CULTURE, BLOOD (ROUTINE X 2)     Status: None   Collection Time    08/31/12 10:26 PM      Result Value Range Status   Specimen Description BLOOD RIGHT ARM  Final   Special Requests     Final   Value: BOTTLES DRAWN AEROBIC AND ANAEROBIC 10CC PT ON PRIMAXIN ,DOXYCYCLINE   Culture  Setup Time 09/01/2012 05:43   Final   Culture     Final   Value:        BLOOD CULTURE RECEIVED NO GROWTH TO DATE CULTURE WILL BE HELD FOR 5 DAYS BEFORE ISSUING A FINAL NEGATIVE REPORT   Report Status PENDING   Incomplete     Studies:  Recent x-ray studies have been reviewed in detail by the Attending Physician  Scheduled Meds:  Scheduled Meds: . ipratropium  0.5 mg Nebulization Q6H   And  . albuterol  2.5 mg Nebulization Q6H  . antiseptic oral rinse  15 mL Mouth Rinse BID  . aspirin  325 mg Oral Once  . atorvastatin  40 mg Oral Daily  . buPROPion  150 mg Oral Daily  . citalopram  20 mg Oral Daily  . fenofibrate  160 mg Oral Daily  . folic acid  1 mg Oral Daily  . furosemide  80 mg Intravenous Once  . furosemide  40 mg Oral BID  . gabapentin  300 mg Oral 5 X Daily  . imipenem-cilastatin  250 mg Intravenous Q6H  . methylPREDNISolone (SOLU-MEDROL) injection  40 mg Intravenous Q6H  . mometasone-formoterol  2 puff Inhalation BID  . montelukast  10 mg Oral Daily  . pantoprazole  40 mg Oral Daily  . rivaroxaban  20 mg Oral Q supper  . sodium chloride  3 mL Intravenous Q12H  . vancomycin  500 mg Intravenous Q12H   Time spent on care of this patient: 35 min   MCCLUNG,JEFFREY T, MD  [facility] Office  [phone] Pager - Text Page per Loretha Stapler as per  below:  On-Call/Text Page:      [email or web address]      password [email or web address]  If 7PM-7AM, please contact night-coverage [email or web address] Password [email or web address] 09/02/2012, 2:51 PM   LOS: 2 days

## 2012-09-02 NOTE — ED Provider Notes (Signed)
I have supervised the resident on the management of this patient and agree with the note above. I personally interviewed and examined the patient and my addendum is below.   Aimee Sanders is a 67 y.o. female hx of severe COPD, CHF here with SOB. She has frequent admissions for COPD and is still smoking. She was seen 2 days ago at [facility] and was sent home on prednisone 50 mg daily. She has been using nebs but not doing better. She is anxious and tachycardic (but given nebs by ems). She had poor air movement and 2+ edema, worse on R side. ABG obtained and showed baseline hypercapnea with nl pH. She was given solumedrol and continuous nebs and Magnesium (which she said that she couldn't tolerate and had some throat itchiness). After about an hour, she started to tire out so she was placed on bipap. Her Trop was elevated so I think she may have NSTEMI so she was given asa and placed on heparin drip. CT angio chest ordered to r/o PE (she has hx of it and not on coumadin) but she was not stable enough to go. Cardiology was consulted and patient admitted to step down under hospitalist. After she was in bipap, she became more comfortable and less tachypneic. She likely has COPD, CHF exacerbation from NSTEMI.     CRITICAL CARE Performed by: Silverio Lay, DAVID   Total critical care time: 45 min   Critical care time was exclusive of separately billable procedures and treating other patients.  Critical care was necessary to treat or prevent imminent or life-threatening deterioration.  Critical care was time spent personally by me on the following activities: development of treatment plan with patient and/or surrogate as well as nursing, discussions with consultants, evaluation of patient's response to treatment, examination of patient, obtaining history from patient or surrogate, ordering and performing treatments and interventions, ordering and review of laboratory studies, ordering and review of radiographic  studies, pulse oximetry and re-evaluation of patient's condition.   Richardean Canal, MD 09/02/12 [phone]

## 2012-09-03 LAB — IRON AND TIBC
Saturation Ratios: 36 % (ref 20–55)
TIBC: 181 ug/dL — ABNORMAL LOW (ref 250–470)

## 2012-09-03 LAB — CBC
HCT: 28.8 % — ABNORMAL LOW (ref 36.0–46.0)
MCV: 93.2 fL (ref 78.0–100.0)
RBC: 3.09 MIL/uL — ABNORMAL LOW (ref 3.87–5.11)
WBC: 4.7 10*3/uL (ref 4.0–10.5)

## 2012-09-03 LAB — BASIC METABOLIC PANEL
BUN: 16 mg/dL (ref 6–23)
CO2: 33 mEq/L — ABNORMAL HIGH (ref 19–32)
Chloride: 94 mEq/L — ABNORMAL LOW (ref 96–112)
Creatinine, Ser: 0.59 mg/dL (ref 0.50–1.10)

## 2012-09-03 LAB — FERRITIN: Ferritin: 103 ng/mL (ref 10–291)

## 2012-09-03 LAB — RETICULOCYTES: Retic Ct Pct: 1.9 % (ref 0.4–3.1)

## 2012-09-03 MED ORDER — GUAIFENESIN ER 600 MG PO TB12
600.0000 mg | ORAL_TABLET | Freq: Two times a day (BID) | ORAL | Status: DC
  Administered 2012-09-03 – 2012-09-06 (×7): 600 mg via ORAL
  Filled 2012-09-03 (×8): qty 1

## 2012-09-03 MED ORDER — VANCOMYCIN HCL IN DEXTROSE 750-5 MG/150ML-% IV SOLN
750.0000 mg | Freq: Two times a day (BID) | INTRAVENOUS | Status: DC
  Administered 2012-09-03 – 2012-09-06 (×6): 750 mg via INTRAVENOUS
  Filled 2012-09-03 (×7): qty 150

## 2012-09-03 NOTE — Progress Notes (Signed)
ANTIBIOTIC CONSULT NOTE - F/U  Pharmacy Consult for vancomycin Indication: rule out pneumonia  Allergies  Allergen Reactions  . Levofloxacin Hives  . Penicillins Hives and Itching    Patient Measurements: Height: 5\' 4"  (162.6 cm) Weight: 127 lb 9.6 oz (57.879 kg) (scale a) IBW/kg (Calculated) : 54.7  Vital Signs: Temp: 97.8 F (36.6 C) (05/17 0536) Temp src: Axillary (05/17 0536) BP: 108/66 mmHg (05/17 0536) Pulse Rate: 80 (05/17 0536) Intake/Output from previous day: 05/16 0701 - 05/17 0700 In: 1613 [P.O.:1010; I.V.:3; IV Piggyback:600] Out: 1500 [Urine:1500] Intake/Output from this shift: Total I/O In: 240 [P.O.:240] Out: -   Labs:  Recent Labs  09/01/12 0844 09/02/12 0345 09/03/12 0345  WBC 4.7 4.6 4.7  HGB 9.6* 9.8* 9.1*  PLT 196 193 202  CREATININE 0.60 0.75 0.59   Estimated Creatinine Clearance: 58.1 ml/min (by C-G formula based on Cr of 0.59).  Recent Labs  09/03/12 1000  VANCOTROUGH 12.8     Microbiology: Recent Results (from the past 720 hour(s))  MRSA PCR SCREENING     Status: None   Collection Time    08/31/12  6:34 PM      Result Value Range Status   MRSA by PCR NEGATIVE  NEGATIVE Final   Comment:            The GeneXpert MRSA Assay (FDA     approved for NASAL specimens     only), is one component of a     comprehensive MRSA colonization     surveillance program. It is not     intended to diagnose MRSA     infection nor to guide or     monitor treatment for     MRSA infections.  CULTURE, BLOOD (ROUTINE X 2)     Status: None   Collection Time    08/31/12 10:26 PM      Result Value Range Status   Specimen Description BLOOD RIGHT HAND   Final   Special Requests     Final   Value: BOTTLES DRAWN AEROBIC ONLY 10CC PT ON PRIMAXIN ,DOXYCYCLINE   Culture  Setup Time 09/01/2012 05:42   Final   Culture     Final   Value:        BLOOD CULTURE RECEIVED NO GROWTH TO DATE CULTURE WILL BE HELD FOR 5 DAYS BEFORE ISSUING A FINAL NEGATIVE REPORT    Report Status PENDING   Incomplete  CULTURE, BLOOD (ROUTINE X 2)     Status: None   Collection Time    08/31/12 10:26 PM      Result Value Range Status   Specimen Description BLOOD RIGHT ARM   Final   Special Requests     Final   Value: BOTTLES DRAWN AEROBIC AND ANAEROBIC 10CC PT ON PRIMAXIN ,DOXYCYCLINE   Culture  Setup Time 09/01/2012 05:43   Final   Culture     Final   Value:        BLOOD CULTURE RECEIVED NO GROWTH TO DATE CULTURE WILL BE HELD FOR 5 DAYS BEFORE ISSUING A FINAL NEGATIVE REPORT   Report Status PENDING   Incomplete    Medical History: Past Medical History  Diagnosis Date  . Chronic combined systolic and diastolic CHF (congestive heart failure)     11/2011 - Echo: EF 40-45% w/ Gr 1 DD.  Marland Kitchen Deep vein thrombosis     Chronic Xarelto.  . Hyperlipidemia   . Sleep apnea   . COPD (chronic obstructive pulmonary disease)  02 dep chronically ; HFA 50% p coaching January 24,2012  . Coronary atherosclerosis of native coronary artery     a. reportedly s/p MI x 2;  b. March 2014 Cardiolite: EF 68% with scar affecting the inferolateral and anterolateral wall; mild ischemia in the mid-septum-> Med Rx.  . Chronic pain     Due to prior hip fracture and DDD back  . Peripheral vascular disease   . Pneumonia     "now and several times before" (07/26/2012)  . H/O hiatal hernia   . Stroke 2002; 2004    Denies residual; "both really light strokes" (07/26/2012)  . Arthritis     "all over" (07/26/2012)  . DDD (degenerative disc disease), lumbosacral   . Depression   . Anxiety     Medications:  Anti-infectives   Start     Dose/Rate Route Frequency Ordered Stop   09/03/12 2300  vancomycin (VANCOCIN) IVPB 750 mg/150 ml premix     750 mg 150 mL/hr over 60 Minutes Intravenous Every 12 hours 09/03/12 1235     09/01/12 1100  vancomycin (VANCOCIN) 500 mg in sodium chloride 0.9 % 100 mL IVPB  Status:  Discontinued     500 mg 100 mL/hr over 60 Minutes Intravenous Every 12 hours 08/31/12  1622 09/03/12 1235   08/31/12 1800  imipenem-cilastatin (PRIMAXIN) 250 mg in sodium chloride 0.9 % 100 mL IVPB     250 mg 200 mL/hr over 30 Minutes Intravenous 4 times per day 08/31/12 1627     08/31/12 1630  vancomycin (VANCOCIN) IVPB 1000 mg/200 mL premix     1,000 mg 200 mL/hr over 60 Minutes Intravenous  Once 08/31/12 1622 09/01/12 0102   08/31/12 1215  doxycycline (VIBRA-TABS) tablet 100 mg     100 mg Oral  Once 08/31/12 1201 08/31/12 1323   08/31/12 1200  doxycycline (VIBRA-TABS) tablet 100 mg  Status:  Discontinued     100 mg Oral Every 12 hours 08/31/12 1146 08/31/12 1159     Assessment: 67 yof presented to the ED with SOB. On empiric vancomycin and primaxin for possible PNA. Pt is currently afebrile and wbc wnl. She received a dose of oral doxycycline earlier today. Noted allergy to penicillin and levaquin. Vancomycin trough slightly subtherapeutic at 12.8 mcg/ml  Goal of Therapy:  Vancomycin trough level 15-20 mcg/ml  Plan:  -Increase Vancomycin to 750mg  IV q12h (dose already hung after trough obtained--start new regimen at next dose)  -Continue primaxin 250mg  IV q6 as ordered

## 2012-09-03 NOTE — Progress Notes (Addendum)
The patient arrived to 85.  The patient was oriented to the unit and placed on telemetry.  VS were taken and the patient was assessed.  The call bell was placed within reach and the bed alarm was turned on.  The RN will continue to monitor the patient.

## 2012-09-03 NOTE — Progress Notes (Signed)
[facility] PROGRESS NOTE  NAYAB ATEN ZOX:[id number] DOB: 1945-04-22 DOA: 08/31/2012 PCP: Rudi Heap, MD  Assessment/Plan: Principal Problem:   Respiratory failure, acute-on-chronic Active Problems:   SMOKER   COPD UNSPECIFIED   Coronary artery disease   Acute-on-chronic respiratory failure   COPD exacerbation   Elevated troponin   Acute on chronic combined systolic and diastolic CHF (congestive heart failure)     Brief narrative:  67 year old female with a history of COPD with continued tobacco use, on 3 L nasal cannula at home, DVT on xarelto, hyperlipidemia, and coronary artery disease who presented with 4 days of worsening shortness of breath. Patient had been complaining of a nonproductive cough without fevers or chills. She had also been complaining of chest discomfort at rest for 2 days. She denied any fevers, chills abdominal pain, dysuria, hematuria, hemoptysis. The patient continues to smoke 3-4 cigarettes per day. She has a history of 80 pack years. The patient endorsed compliance with her medications. Of note, the patient was recently admitted between 07/26/12 and 07/28/12 for pneumococcal PNA. Her urine streptococcal pneumonia antigen was positive. The patient was discharged with levofloxacin.  In the emergency department, the patient was noted to have some distress. The patient was given furosemide 80 mg IV x1, Solu-Medrol 125mg  with aerosolized albuterol and atrovent. The patient was placed on BiPAP. ABG showed 7.35/59/54/32. The patient was given Ativan 1 mg IV and morphine 2 mg IV which helped with her respirations. WBC was noted to be 15.8. BMP was unremarkable. ProBNP was 2598 and troponin was elevated at 0.21. Chest x-ray showed mild increased perihilar interstitial prominence concerning for pneumonitis.   Assessment/Plan:  Acute on chronic respiratory failure due to:  A) - acute COPD exac  B) - acute CHF exac  -see individual issues discussed below   Acute  bronchospastic COPD ecacerbation  Home O2 dependent at 3LPM at all times - slowly responding to bronchodilators, empiric abx, and taper steroids today - counseled on need to d/c tobacco entirely   Bibasilar pulmonary interstitial infiltrates / B hilar prominence - ?CAP vs HCAP vs/ atx w/ bronchitis  -Blood cultures x2 sets  -empiric vancomycin and imipenem  -patient develops a rash with PCN   Acute on chronic combined systolic and diastolic CHF  11/2011 - Echo: EF 40-45% w/ Gr 1 DD - no signif change on TTE this admit - care as per Cardiology - appears to be maximally diuresed at this time - No ACE/ARB at this time due to relative hypotension    Mildly Elevated troponin w/ known CAD  -care as per Cardiology - no further acute evaluation planned   Current Hypotension with hx of Hypertension  -Hold beta blocker and Imdur at this time as the patient's blood pressure is soft - no further diuresis for now - follow trend   Anxiety  -Patient takes Xanax 0.5 mg every 6 hours when necessary  -Continue at this time   Tobacco abuse  -Tobacco cessation discussed   History of DVT lower extremities  -Patient was placed on heparin drip due to elevated troponin at time of admit  -resumed xarelto after discontinuing Heparin  Microcytic anemia  Likely due to chronic disease - Hgb was 10.6 in October 2013 - was guiac negative August 2013 - will check anemia panel in AM    Code Status: FULL  Family Communication: none  Disposition Plan: transfer to tele bed  Consultants:  Cardiology  Procedures:  5/15 - TTE - Inferolateral hypokinesis - mild  LVH - ejection fraction was 45% - left ventricular diastolic dysfunction  Antibiotics:  Vanc 5/14 >>  Primaxin 5/14 >>  DVT prophylaxis:  Xarelto    HPI/Subjective: She continues to c/o sob, as well as L lateral chest wall pain that today sounds pleuritic in nature   Objective: Filed Vitals:   09/02/12 2214 09/03/12 0010 09/03/12 0221 09/03/12 0536   BP:    108/66  Pulse:  80  80  Temp:    97.8 F (36.6 C)  TempSrc:    Axillary  Resp:  18  18  Height:      Weight:    57.879 kg (127 lb 9.6 oz)  SpO2: 97%  92% 9%    Intake/Output Summary (Last 24 hours) at 09/03/12 0813 Last data filed at 09/03/12 0700  Gross per 24 hour  Intake   1613 ml  Output   1500 ml  Net    113 ml    Exam:  General: able to complete full sentences w/o pause - mild resp distress  Lungs: poor air movement th/o all fields w/ mild exp wheeze - distant BS  Cardiovascular: distant heart sounds - no appreciable M, G, R - RRR  Abdomen: Nontender, nondistended, soft, bowel sounds positive, no rebound, no ascites, no appreciable mass  Extremities: No significant cyanosis, clubbing, or edema bilateral lower extremities    Data Reviewed: Basic Metabolic Panel:  Recent Labs Lab 08/31/12 1303 09/01/12 0844 09/02/12 0345  NA 139 137 136  K 4.2 3.8 3.9  CL 96 95* 94*  CO2 34* 30 35*  GLUCOSE 138* 128* 131*  BUN 10 12 15   CREATININE 0.66 0.60 0.75  CALCIUM 9.2 8.9 8.8    Liver Function Tests: No results found for this basename: AST, ALT, ALKPHOS, BILITOT, PROT, ALBUMIN,  in the last 168 hours No results found for this basename: LIPASE, AMYLASE,  in the last 168 hours No results found for this basename: AMMONIA,  in the last 168 hours  CBC:  Recent Labs Lab 08/31/12 1303 09/01/12 0844 09/02/12 0345  WBC 15.8* 4.7 4.6  NEUTROABS 14.4*  --   --   HGB 10.7* 9.6* 9.8*  HCT 31.6* 28.4* 29.4*  MCV 92.1 90.7 93.0  PLT 306 196 193    Cardiac Enzymes:  Recent Labs Lab 08/31/12 1622 08/31/12 2227 09/01/12 0844  CKTOTAL  --   --  69  CKMB  --   --  5.9*  TROPONINI 0.73* 1.02* 0.66*   BNP (last 3 results)  Recent Labs  06/22/12 2005 07/26/12 1616 08/31/12 1303  PROBNP 3562.0* 1873.0* 2598.0*     CBG: No results found for this basename: GLUCAP,  in the last 168 hours  Recent Results (from the past 240 hour(s))  MRSA PCR  SCREENING     Status: None   Collection Time    08/31/12  6:34 PM      Result Value Range Status   MRSA by PCR NEGATIVE  NEGATIVE Final   Comment:            The GeneXpert MRSA Assay (FDA     approved for NASAL specimens     only), is one component of a     comprehensive MRSA colonization     surveillance program. It is not     intended to diagnose MRSA     infection nor to guide or     monitor treatment for     MRSA infections.  CULTURE, BLOOD (ROUTINE  X 2)     Status: None   Collection Time    08/31/12 10:26 PM      Result Value Range Status   Specimen Description BLOOD RIGHT HAND   Final   Special Requests     Final   Value: BOTTLES DRAWN AEROBIC ONLY 10CC PT ON PRIMAXIN ,DOXYCYCLINE   Culture  Setup Time 09/01/2012 05:42   Final   Culture     Final   Value:        BLOOD CULTURE RECEIVED NO GROWTH TO DATE CULTURE WILL BE HELD FOR 5 DAYS BEFORE ISSUING A FINAL NEGATIVE REPORT   Report Status PENDING   Incomplete  CULTURE, BLOOD (ROUTINE X 2)     Status: None   Collection Time    08/31/12 10:26 PM      Result Value Range Status   Specimen Description BLOOD RIGHT ARM   Final   Special Requests     Final   Value: BOTTLES DRAWN AEROBIC AND ANAEROBIC 10CC PT ON PRIMAXIN ,DOXYCYCLINE   Culture  Setup Time 09/01/2012 05:43   Final   Culture     Final   Value:        BLOOD CULTURE RECEIVED NO GROWTH TO DATE CULTURE WILL BE HELD FOR 5 DAYS BEFORE ISSUING A FINAL NEGATIVE REPORT   Report Status PENDING   Incomplete     Studies: Ct Angio Chest Pe W/cm &/or Wo Cm  08/31/2012   *RADIOLOGY REPORT*  Clinical Data: Shortness of breath, COPD  CT ANGIOGRAPHY CHEST  Technique:  Multidetector CT imaging of the chest using the standard protocol during bolus administration of intravenous contrast. Multiplanar reconstructed images including MIPs were obtained and reviewed to evaluate the vascular anatomy.  Contrast: 80mL OMNIPAQUE IOHEXOL 350 MG/ML SOLN  Comparison: 08/31/2012 radiograph,  02/08/2012 CT  Findings: Respiratory motion degrades evaluation of the lower lobe segmental branches.  Otherwise, no pulmonary arterial branch filling defect is identified.  Normal caliber aorta with scattered atherosclerotic disease.  Heart size mildly enlarged.  Coronary artery calcification. Leftward mediastinal shift.  No intrathoracic lymphadenopathy.  Limited images through the upper abdomen show no acute finding.  Central airways are patent with mild lower lobe peribronchial thickening.  Mild bibasilar opacities.  No pneumothorax. Centrolobular emphysema.  Apical scarring.  Detailed parenchymal evaluation of the lung bases degraded by respiratory motion.  Diffuse osteopenia and multilevel degenerative changes.  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  IMPRESSION: Degraded by respiratory motion which obscures evaluation of the lower lobe segmental branches.  Allowing for this, no pulmonary embolism identified.  Cardiomegaly with coronary artery calcification.  COPD and bibasilar opacities; scarring, atelectasis, or infiltrate.   Original Report Authenticated By: Jearld Lesch, M.D.   Dg Chest Portable 1 View  08/31/2012   *RADIOLOGY REPORT*  Clinical Data: COPD, sleep apnea, shortness of breath.  PORTABLE CHEST - 1 VIEW  Comparison: 08/29/2012.  Findings: Cardiomegaly again noted.  Mild perihilar interstitial prominence suspicious for pneumonitis or bronchitic changes.  No segmental infiltrate. Prior vertebroplasty again noted.  IMPRESSION: Cardiomegaly.  Mild perihilar interstitial prominence suspicious for pneumonitis or bronchitic changes.  No segmental infiltrate.   Original Report Authenticated By: Natasha Mead, M.D.    Scheduled Meds: . ipratropium  0.5 mg Nebulization Q6H   And  . albuterol  2.5 mg Nebulization Q6H  . antiseptic oral rinse  15 mL Mouth Rinse BID  . atorvastatin  40 mg Oral Daily  . buPROPion  150 mg Oral  Daily  . citalopram  20 mg Oral  Daily  . fenofibrate  160 mg Oral Daily  . folic acid  1 mg Oral Daily  . furosemide  80 mg Intravenous Once  . furosemide  40 mg Oral BID  . gabapentin  300 mg Oral 5 X Daily  . imipenem-cilastatin  250 mg Intravenous Q6H  . methylPREDNISolone (SOLU-MEDROL) injection  60 mg Intravenous Q8H  . montelukast  10 mg Oral Daily  . pantoprazole  40 mg Oral Daily  . rivaroxaban  20 mg Oral Q supper  . sodium chloride  3 mL Intravenous Q12H  . vancomycin  500 mg Intravenous Q12H   Continuous Infusions:   Principal Problem:   Respiratory failure, acute-on-chronic Active Problems:   SMOKER   COPD UNSPECIFIED   Coronary artery disease   Acute-on-chronic respiratory failure   COPD exacerbation   Elevated troponin   Acute on chronic combined systolic and diastolic CHF (congestive heart failure)    Time spent: 40 minutes   [facility]  [facility] Pager [phone]. If 8PM-8AM, please contact night-coverage at [email or web address], password [facility] 09/03/2012, 8:13 AM  LOS: 3 days

## 2012-09-03 NOTE — Evaluation (Signed)
Occupational Therapy Evaluation Patient Details Name: Aimee Sanders MRN: [id number] DOB: 07-Oct-1945 Today's Date: 09/03/2012 Time: 7829-5621 OT Time Calculation (min): 17 min  OT Assessment / Plan / Recommendation Clinical Impression  Pt admitted with acute on chronic respiratory failure due to acute COPD and CHF exacerbation.  Will benefit from continued OT services to address below problem list in prep for return home with family.     OT Assessment  Patient needs continued OT Services    Follow Up Recommendations  No OT follow up;Supervision/Assistance - 24 hour    Barriers to Discharge      Equipment Recommendations  None recommended by OT    Recommendations for Other Services    Frequency  Min 2X/week    Precautions / Restrictions     Pertinent Vitals/Pain 95% at rest and 90% with activity on 4L O2 nasal cannula    ADL  Grooming: Performed;Brushing hair;Wash/dry face;Set up Where Assessed - Grooming: Unsupported sitting Upper Body Bathing: Simulated;Set up Where Assessed - Upper Body Bathing: Unsupported sitting Lower Body Bathing: Simulated;Min guard Where Assessed - Lower Body Bathing: Unsupported sit to stand Upper Body Dressing: Simulated;Set up Where Assessed - Upper Body Dressing: Unsupported sitting Lower Body Dressing: Simulated;Min guard Where Assessed - Lower Body Dressing: Unsupported sit to stand Toilet Transfer: Simulated;Minimal assistance Toilet Transfer Method:  (ambulating) Toilet Transfer Equipment:  (bed ambulating to chair) Equipment Used: Gait belt;Rolling walker (4L O2) Transfers/Ambulation Related to ADLs: Min assist for safely maneuvering RW while ambulating in room.  ADL Comments: Pt is near basline but fatigues easily. Increased time and effort for all ADL and functional mobility tasks.    OT Diagnosis: Generalized weakness;Acute pain  OT Problem List: Decreased strength;Decreased activity tolerance;Cardiopulmonary status limiting  activity;Decreased knowledge of use of DME or AE;Pain OT Treatment Interventions: Self-care/ADL training;DME and/or AE instruction;Energy conservation;Therapeutic activities;Patient/family education   OT Goals Acute Rehab OT Goals OT Goal Formulation: With patient Time For Goal Achievement: 09/10/12 Potential to Achieve Goals: Good ADL Goals Pt Will Transfer to Toilet: with supervision;Ambulation;with DME;Comfort height toilet ADL Goal: Toilet Transfer - Progress: Goal set today Miscellaneous OT Goals Miscellaneous OT Goal #1: Pt will choose 3 energy conservation techniques from handout to implement into her ADL routine. OT Goal: Miscellaneous Goal #1 - Progress: Goal set today  Visit Information  Last OT Received On: 09/03/12 Assistance Needed: +1    Subjective Data  Subjective: "I usually walk like this"   Prior Functioning     Home Living Lives With: Spouse;Family Available Help at Discharge: Family;Available 24 hours/day Type of Home: Mobile home Home Access: Stairs to enter Entrance Stairs-Number of Steps: 4 Entrance Stairs-Rails: Right;Left;Can reach both Home Layout: One level Bathroom Shower/Tub: Engineer, manufacturing systems: Standard Home Adaptive Equipment: Bedside commode/3-in-1;Tub transfer bench;Walker - rolling;Straight cane;Wheelchair - manual (3L O2) Prior Function Level of Independence: Independent with assistive device(s);Needs assistance [facility]) Needs Assistance: Bathing;Dressing Bath: Minimal Dressing: Minimal Able to Take Stairs?: Yes Driving: No Vocation: Retired Musician: No difficulties         Vision/Perception     Copywriter, advertising Arousal/Alertness: Awake/alert Behavior During Therapy: WFL for tasks assessed/performed Overall Cognitive Status: Within Functional Limits for tasks assessed    Extremity/Trunk Assessment Right Upper Extremity Assessment RUE ROM/Strength/Tone: WFL for tasks assessed Left Upper  Extremity Assessment LUE ROM/Strength/Tone: WFL for tasks assessed     Mobility Bed Mobility Bed Mobility: Supine to Sit;Sitting - Scoot to Edge of Bed Supine to Sit: 5: Supervision;HOB elevated Sitting -  Scoot to [place] of Bed: 5: Supervision Transfers Transfers: Sit to Stand;Stand to Sit Sit to Stand: 4: Min guard;From bed Stand to Sit: 4: Min guard;To chair/3-in-1;With upper extremity assist Details for Transfer Assistance: min guard for safety     Exercise     Balance     End of Session OT - End of Session Equipment Utilized During Treatment: Gait belt Activity Tolerance: Patient limited by fatigue Patient left: in chair;with call bell/phone within reach;Other (comment) (with RT) Nurse Communication: Mobility status  GO    09/03/2012 Cipriano Mile OTR/L Pager [phone] Office [phone]  Cipriano Mile 09/03/2012, 9:51 AM

## 2012-09-03 NOTE — Evaluation (Signed)
Physical Therapy Evaluation Patient Details Name: Aimee Sanders MRN: [id number] DOB: 1945/05/31 Today's Date: 09/03/2012 Time: 4098-1191 PT Time Calculation (min): 20 min  PT Assessment / Plan / Recommendation Clinical Impression  Pt is 67 y/o female admitted for COPD exacerbation.  Pt limited due to overall fatigue and endurace and will benefit from acute PT services.  Pt SaO2 maintained >90% on 4L during all mobility.  Pt with nonproductive cough at this time.  Pt    PT Assessment  Patient needs continued PT services    Follow Up Recommendations  Home health PT;Supervision - Intermittent    Barriers to Discharge None      Equipment Recommendations  None recommended by PT    Frequency Min 3X/week    Precautions / Restrictions     Pertinent Vitals/Pain No c/o pain      Mobility  Bed Mobility Bed Mobility: Supine to Sit;Sitting - Scoot to Edge of Bed Supine to Sit: 5: Supervision;HOB elevated Sitting - Scoot to Edge of Bed: 5: Supervision Transfers Transfers: Sit to Stand;Stand to Sit Sit to Stand: 4: Min guard;From bed Stand to Sit: 4: Min guard;To chair/3-in-1;With upper extremity assist Details for Transfer Assistance: min guard for safety Ambulation/Gait Ambulation/Gait Assistance: 4: Min guard Ambulation Distance (Feet): 20 Feet Assistive device: Rolling walker Ambulation/Gait Assistance Details: Minguard for safety with cues for upright posture Gait Pattern: Step-through pattern;Decreased stride length;Left circumduction Gait velocity: decreased due to old hip fx Stairs: No    Exercises     PT Diagnosis: Difficulty walking;Generalized weakness;Acute pain  PT Problem List: Decreased strength;Decreased activity tolerance;Decreased balance;Decreased mobility;Decreased knowledge of use of DME;Cardiopulmonary status limiting activity PT Treatment Interventions: DME instruction;Gait training;Stair training;Functional mobility training;Therapeutic  activities;Therapeutic exercise;Patient/family education   PT Goals Acute Rehab PT Goals PT Goal Formulation: With patient Time For Goal Achievement: 09/10/12 Potential to Achieve Goals: Good Pt will go Supine/Side to Sit: with modified independence PT Goal: Supine/Side to Sit - Progress: Goal set today Pt will go Sit to Stand: with modified independence PT Goal: Sit to Stand - Progress: Goal set today Pt will go Stand to Sit: with modified independence PT Goal: Stand to Sit - Progress: Goal set today Pt will Ambulate: >150 feet;with modified independence;with rolling walker PT Goal: Ambulate - Progress: Goal set today Pt will Go Up / Down Stairs: 3-5 stairs;with modified independence;with least restrictive assistive device PT Goal: Up/Down Stairs - Progress: Goal set today  Visit Information  Last PT Received On: 09/03/12 Assistance Needed: +1    Subjective Data  Subjective: "I don't get out to much." Patient Stated Goal: To go home   Prior Functioning  Home Living Lives With: Spouse;Family Available Help at Discharge: Family;Available 24 hours/day Type of Home: Mobile home Home Access: Stairs to enter Entrance Stairs-Number of Steps: 4 Entrance Stairs-Rails: Right;Left;Can reach both Home Layout: One level Bathroom Shower/Tub: Engineer, manufacturing systems: Standard Home Adaptive Equipment: Bedside commode/3-in-1;Tub transfer bench;Walker - rolling;Straight cane;Wheelchair - manual (3L O2) Prior Function Level of Independence: Independent with assistive device(s);Needs assistance [facility]) Needs Assistance: Bathing;Dressing Bath: Minimal Dressing: Minimal Able to Take Stairs?: Yes Driving: No Vocation: Retired Musician: No difficulties    Copywriter, advertising Arousal/Alertness: Awake/alert Behavior During Therapy: WFL for tasks assessed/performed Overall Cognitive Status: Within Functional Limits for tasks assessed    Extremity/Trunk  Assessment Right Upper Extremity Assessment RUE ROM/Strength/Tone: [facility] for tasks assessed Left Upper Extremity Assessment LUE ROM/Strength/Tone: WFL for tasks assessed Right Lower Extremity Assessment RLE ROM/Strength/Tone: Within functional  levels Left Lower Extremity Assessment LLE ROM/Strength/Tone: Within functional levels   Balance    End of Session PT - End of Session Equipment Utilized During Treatment: Gait belt;Oxygen (4L) Activity Tolerance: Patient tolerated treatment well Patient left: in chair;with call bell/phone within reach Nurse Communication: Mobility status  GP     Takeshia Wenk 09/03/2012, 11:01 AM Jake Shark, PT DPT [phone]

## 2012-09-04 DIAGNOSIS — I472 Ventricular tachycardia: Secondary | ICD-10-CM | POA: Diagnosis not present

## 2012-09-04 LAB — CBC
HCT: 29.1 % — ABNORMAL LOW (ref 36.0–46.0)
MCHC: 33.3 g/dL (ref 30.0–36.0)
MCV: 92.1 fL (ref 78.0–100.0)
RDW: 15 % (ref 11.5–15.5)
WBC: 5.1 10*3/uL (ref 4.0–10.5)

## 2012-09-04 LAB — COMPREHENSIVE METABOLIC PANEL
Albumin: 2.4 g/dL — ABNORMAL LOW (ref 3.5–5.2)
BUN: 14 mg/dL (ref 6–23)
Chloride: 89 mEq/L — ABNORMAL LOW (ref 96–112)
Creatinine, Ser: 0.57 mg/dL (ref 0.50–1.10)
GFR calc Af Amer: >90 mL/min (ref >90)
Glucose, Bld: 123 mg/dL — ABNORMAL HIGH (ref 70–99)
Total Bilirubin: 0.3 mg/dL (ref 0.3–1.2)

## 2012-09-04 MED ORDER — ACETAMINOPHEN 325 MG PO TABS: 650.0000 mg | ORAL_TABLET | Freq: Four times a day (QID) | ORAL | Status: DC | PRN

## 2012-09-04 MED ORDER — BENZONATATE 100 MG PO CAPS
200.0000 mg | ORAL_CAPSULE | Freq: Two times a day (BID) | ORAL | Status: DC
  Administered 2012-09-04 – 2012-09-06 (×5): 200 mg via ORAL
  Filled 2012-09-04 (×6): qty 2

## 2012-09-04 MED ORDER — POTASSIUM CHLORIDE CRYS ER 20 MEQ PO TBCR
40.0000 meq | EXTENDED_RELEASE_TABLET | Freq: Three times a day (TID) | ORAL | Status: AC
  Administered 2012-09-04 (×3): 40 meq via ORAL
  Filled 2012-09-04 (×3): qty 2

## 2012-09-04 MED ORDER — METHYLPREDNISOLONE SODIUM SUCC 125 MG IJ SOLR
60.0000 mg | Freq: Two times a day (BID) | INTRAMUSCULAR | Status: DC
  Administered 2012-09-04 – 2012-09-06 (×4): 60 mg via INTRAVENOUS
  Filled 2012-09-04 (×6): qty 0.96

## 2012-09-04 NOTE — Care Management Note (Signed)
    Page 1 of 2   09/06/2012     2:55:53 PM   CARE MANAGEMENT NOTE 09/06/2012  Patient:  Aimee Sanders, Aimee Sanders   Account Number:  [id number]  Date Initiated:  09/04/2012  Documentation initiated by:  Donn Pierini  Subjective/Objective Assessment:   Pt admitted wtih SOB- Acute on chronic respiratory failure- hx COPD     Action/Plan:   PTA pt lived at home   Anticipated DC Date:  09/05/2012   Anticipated DC Plan:  HOME W HOME HEALTH SERVICES      DC Planning Services  CM consult      [facility] Choice  HOME HEALTH   Choice offered to / List presented to:  C-1 Patient        HH arranged  HH-1 RN  HH-2 PT  HH-3 OT      [facility] agency  [facility]   Status of service:  In process, will continue to follow Medicare Important Message given?   (If response is "NO", the following Medicare IM given date fields will be blank) Date Medicare IM given:   Date Additional Medicare IM given:    Discharge Disposition:  HOME W HOME HEALTH SERVICES  Per UR Regulation:    If discussed at Long Length of Stay Meetings, dates discussed:    Comments:  09/06/12 1430.Marland KitchenMarland KitchenOletta Cohn, RN, BSN, [place] [phone] Pt son here to pick up for discharge.  Requests O2 tank for mom as all tanks at home are empty.  States there is a problem with keeping them stocked and that company does not do a good job of replacing empty tanks. Attributes pt frequent admissions to O2 supply. CM to get O2 tank for discharge through [facility].  09/04/12- 0900- Donn Pierini RN, BSN [phone] Received referral for [facility] services- HH-RN/PT/OT- spoke with pt at bedside- per conversation pt is agreeable to having the HH-RN but does not want the PT/OT- pt states she has had HH in the past with [facility] and wishing to use them again for services. Pt also states that she has a CPAP at home but that she does not like the mask- not sure who the CPAP is through-but knows there is a sticker on machine- advised pt to see who machine is with and call # on  machine to ask about a different type of mask that she might tolerate better and they can work with her primary doctor if needed to get a mask that works for her. Referral for HH-RN called in to [facility] and orders faxed to [facility]- services to begin within 24-48 hr post discharge.

## 2012-09-04 NOTE — Progress Notes (Signed)
Patient ID: Aimee Sanders, female   DOB: 1945-07-09, 67 y.o.   MRN: [id number]   SUBJECTIVE:  Patient says that her breathing is not quite as good today.. On her monitor this morning there were 9 beats of ventricular tachycardia with a rate of 160. Her potassium today is 3.0. This will need to be repleted completely.   Filed Vitals:   09/03/12 2038 09/04/12 0016 09/04/12 0502 09/04/12 0837  BP: 114/70  118/69   Pulse: 83 88 82   Temp: 97.5 F (36.4 C)  97.9 F (36.6 C)   TempSrc: Oral  Oral   Resp: 18 20 16    Height:      Weight:   127 lb 14.4 oz (58.015 kg)   SpO2: 95%  96% 97%    Intake/Output Summary (Last 24 hours) at 09/04/12 1109 Last data filed at 09/04/12 1028  Gross per 24 hour  Intake   1020 ml  Output   3801 ml  Net  -2781 ml    LABS: Basic Metabolic Panel:  Recent Labs  40/98/11 0345 09/04/12 0445  NA 138 141  K 3.8 3.0*  CL 94* 89*  CO2 33* 44*  GLUCOSE 109* 123*  BUN 16 14  CREATININE 0.59 0.57  CALCIUM 8.6 8.6   Liver Function Tests:  Recent Labs  09/04/12 0445  AST 24  ALT 14  ALKPHOS 54  BILITOT 0.3  PROT 5.2*  ALBUMIN 2.4*   No results found for this basename: LIPASE, AMYLASE,  in the last 72 hours CBC:  Recent Labs  09/03/12 0345 09/04/12 0445  WBC 4.7 5.1  HGB 9.1* 9.7*  HCT 28.8* 29.1*  MCV 93.2 92.1  PLT 202 199   Cardiac Enzymes: No results found for this basename: CKTOTAL, CKMB, CKMBINDEX, TROPONINI,  in the last 72 hours BNP: No components found with this basename: POCBNP,  D-Dimer: No results found for this basename: DDIMER,  in the last 72 hours Hemoglobin A1C: No results found for this basename: HGBA1C,  in the last 72 hours Fasting Lipid Panel: No results found for this basename: CHOL, HDL, LDLCALC, TRIG, CHOLHDL, LDLDIRECT,  in the last 72 hours Thyroid Function Tests: No results found for this basename: TSH, T4TOTAL, FREET3, T3FREE, THYROIDAB,  in the last 72 hours  RADIOLOGY: Ct Angio Chest Pe W/cm &/or  Wo Cm  08/31/2012   *RADIOLOGY REPORT*  Clinical Data: Shortness of breath, COPD  CT ANGIOGRAPHY CHEST  Technique:  Multidetector CT imaging of the chest using the standard protocol during bolus administration of intravenous contrast. Multiplanar reconstructed images including MIPs were obtained and reviewed to evaluate the vascular anatomy.  Contrast: 80mL OMNIPAQUE IOHEXOL 350 MG/ML SOLN  Comparison: 08/31/2012 radiograph, 02/08/2012 CT  Findings: Respiratory motion degrades evaluation of the lower lobe segmental branches.  Otherwise, no pulmonary arterial branch filling defect is identified.  Normal caliber aorta with scattered atherosclerotic disease.  Heart size mildly enlarged.  Coronary artery calcification. Leftward mediastinal shift.  No intrathoracic lymphadenopathy.  Limited images through the upper abdomen show no acute finding.  Central airways are patent with mild lower lobe peribronchial thickening.  Mild bibasilar opacities.  No pneumothorax. Centrolobular emphysema.  Apical scarring.  Detailed parenchymal evaluation of the lung bases degraded by respiratory motion.  Diffuse osteopenia and multilevel degenerative changes.  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  IMPRESSION: Degraded by respiratory motion which obscures evaluation of the lower lobe segmental branches.  Allowing for this, no pulmonary embolism identified.  Cardiomegaly  with coronary artery calcification.  COPD and bibasilar opacities; scarring, atelectasis, or infiltrate.   Original Report Authenticated By: Jearld Lesch, M.D.   Dg Chest Portable 1 View  08/31/2012   *RADIOLOGY REPORT*  Clinical Data: COPD, sleep apnea, shortness of breath.  PORTABLE CHEST - 1 VIEW  Comparison: 08/29/2012.  Findings: Cardiomegaly again noted.  Mild perihilar interstitial prominence suspicious for pneumonitis or bronchitic changes.  No segmental infiltrate. Prior vertebroplasty again noted.  IMPRESSION:  Cardiomegaly.  Mild perihilar interstitial prominence suspicious for pneumonitis or bronchitic changes.  No segmental infiltrate.   Original Report Authenticated By: Natasha Mead, M.D.    PHYSICAL EXAM  Patient is oriented to person time and place. Affect is normal. She is frail. Lungs reveal decreased breath sounds. Cardiac exam reveals S1 and S2. There is no significant peripheral edema.   TELEMETRY:  I have reviewed telemetry today Sep 04, 2012. There sinus rhythm. There is 9 beats of ventricular tachycardia with a rate of 160 early this morning.   ASSESSMENT AND PLAN:     Respiratory failure, acute-on-chronic    Hypokalemia    Orders have ordered a been written for the patient to receive 120 mEq of potassium today.      Elevated troponin    The plan is for medical therapy of her coronary disease.    Acute on chronic combined systolic and diastolic CHF (congestive heart failure)     The patient continues to diuresis with oral diuretics. Renal function is remaining stable.    Ventricular tachycardia     The patient had 9 beats of ventricular tachycardia with a rate of 160 early this morning. Potassium was 3.0 at that time. The plan is to repeat her potassium vigorously. No other workup at this time.  Willa Rough 09/04/2012 11:09 AM

## 2012-09-04 NOTE — Progress Notes (Signed)
MEDICARE-CERTIFIED HOME HEALTH AGENCIES [place]   Agencies that are Medicare-Certified and affiliated with [facility]  Home Health Agency  Telephone Number Address  [facility]  [facility] has ownership interest in this company; however, you are under no obligation to use this agency. [phone]  [address] [place], [place] [address]    Agencies that are Medicare-Certified and are not affiliated with [facility]   Home Health Agency Telephone Number Address  [facility] [phone] Fax [phone] [address] [place], [place]  [address]  [facility] [phone] [address] [place], [place] [address]  [facility]  [phone] Fax [phone] [address]  [place], [place]  [address]  [facility] [phone] or [phone] [address] [place], [place] [address]  [facility] [phone] or [phone] [address] [place], [place]  [address]      Agencies that are not Medicare-Certified and are not affiliated with [facility]    Home Health Agency Telephone Number Address  [facility] [phone] Fax [phone] [address] [place], [place]  [address]  [facility] [phone] or [phone] Fax [phone] [address] [place], [place]  [address]  [facility]  [phone] [address] [place], [place]  [facility] [phone] Fax [phone] [address] [place], [place]  [address]  [facility] [phone] Fax [phone] [address] [place], [place]  [address]  [facility] [phone] [address] [place], [place]  [address]  [facility] [facility] [phone] Fax [phone] [address] [place], [place] [address]  [facility] [phone] [address] [place], [place]  [address]  [facility] [phone] Fax [phone] [address] [place], [place]  [address]    

## 2012-09-04 NOTE — Progress Notes (Signed)
[facility] PROGRESS NOTE  Aimee Sanders ZOX:[id number] DOB: Mar 22, 1946 DOA: 08/31/2012 PCP: Rudi Heap, MD  Assessment/Plan: Principal Problem:   Respiratory failure, acute-on-chronic Active Problems:   SMOKER   COPD UNSPECIFIED   Coronary artery disease   Acute-on-chronic respiratory failure   COPD exacerbation   Elevated troponin   Acute on chronic combined systolic and diastolic CHF (congestive heart failure)     Brief narrative:  67 year old female with a history of COPD with continued tobacco use, on 3 L nasal cannula at home, DVT on xarelto, hyperlipidemia, and coronary artery disease who presented with 4 days of worsening shortness of breath. Patient had been complaining of a nonproductive cough without fevers or chills. She had also been complaining of chest discomfort at rest for 2 days. She denied any fevers, chills abdominal pain, dysuria, hematuria, hemoptysis. The patient continues to smoke 3-4 cigarettes per day. She has a history of 80 pack years. The patient endorsed compliance with her medications. Of note, the patient was recently admitted between 07/26/12 and 07/28/12 for pneumococcal PNA. Her urine streptococcal pneumonia antigen was positive. The patient was discharged with levofloxacin.  In the emergency department, the patient was noted to have some distress. The patient was given furosemide 80 mg IV x1, Solu-Medrol 125mg  with aerosolized albuterol and atrovent. The patient was placed on BiPAP. ABG showed 7.35/59/54/32. The patient was given Ativan 1 mg IV and morphine 2 mg IV which helped with her respirations. WBC was noted to be 15.8. BMP was unremarkable. ProBNP was 2598 and troponin was elevated at 0.21. Chest x-ray showed mild increased perihilar interstitial prominence concerning for pneumonitis.   Assessment/Plan:  Acute on chronic respiratory failure due to:  A) - acute COPD exac  B) - acute CHF exac  -see individual issues discussed below  Acute  bronchospastic COPD ecacerbation  Home O2 dependent at 3LPM at all times oxygen requirement stable - slowly responding to bronchodilators, empiric abx, and taper steroids today - counseled on need to d/c tobacco entirely   Bibasilar pulmonary interstitial infiltrates / B hilar prominence - ?CAP vs HCAP vs/ atx w/ bronchitis  -Blood cultures x2 sets  -empiric vancomycin and imipenem  -patient develops a rash with PCN    Hypokalemia Repeat orally and recheck BMP in the morning  Acute on chronic combined systolic and diastolic CHF  11/2011 - Echo: EF 40-45% w/ Gr 1 DD - no signif change on TTE this admit - care as per Cardiology - appears to be maximally diuresed at this time - No ACE/ARB at this time due to relative hypotension  Transition to oral Lasix   Mildly Elevated troponin w/ known CAD  -care as per Cardiology - no further acute evaluation planned   Current Hypotension with hx of Hypertension  -Hold beta blocker and Imdur at this time as the patient's blood pressure is soft - no further diuresis for now - follow trend   Anxiety  -Patient takes Xanax 0.5 mg every 6 hours when necessary  -Continue at this time   Tobacco abuse  -Tobacco cessation discussed  History of DVT lower extremities  -Patient was placed on heparin drip due to elevated troponin at time of admit  -resumed xarelto after discontinuing Heparin   Microcytic anemia  Likely due to chronic disease - Hgb was 10.6 in October 2013 - was guiac negative August 2013 - will check anemia panel in AM    Code Status: FULL  Family Communication: none  Disposition Plan: Likely discharge  tomorrow with home health   Consultants:  Cardiology  Procedures:  5/15 - TTE - Inferolateral hypokinesis - mild LVH - ejection fraction was 45% - left ventricular diastolic dysfunction  Antibiotics:  Vanc 5/14 >>  Primaxin 5/14 >>  DVT prophylaxis:  Xarelto    HPI/Subjective:  She continues to c/o sob, as well as L lateral  chest wall pain that today sounds pleuritic in nature,headache    Objective: Filed Vitals:   09/03/12 2029 09/03/12 2038 09/04/12 0016 09/04/12 0502  BP:  114/70  118/69  Pulse:  83 88 82  Temp:  97.5 F (36.4 C)  97.9 F (36.6 C)  TempSrc:  Oral  Oral  Resp:  18 20 16   Height:      Weight:    58.015 kg (127 lb 14.4 oz)  SpO2: 93% 95%  96%    Intake/Output Summary (Last 24 hours) at 09/04/12 0804 Last data filed at 09/04/12 0454  Gross per 24 hour  Intake   1020 ml  Output   3801 ml  Net  -2781 ml    Exam:  HENT:  Head: Atraumatic.  Nose: Nose normal.  Mouth/Throat: Oropharynx is clear and moist.  Eyes: Conjunctivae are normal. Pupils are equal, round, and reactive to light. No scleral icterus.  Neck: Neck supple. No tracheal deviation present.  Cardiovascular: Normal rate, regular rhythm, normal heart sounds and intact distal pulses.  Pulmonary/Chest: Effort normal and breath sounds normal. No respiratory distress.  Abdominal: Soft. Normal appearance and bowel sounds are normal. She exhibits no distension. There is no tenderness.  Musculoskeletal: She exhibits no edema and no tenderness.  Neurological: She is alert. No cranial nerve deficit.    Data Reviewed: Basic Metabolic Panel:  Recent Labs Lab 08/31/12 1303 09/01/12 0844 09/02/12 0345 09/03/12 0345 09/04/12 0445  NA 139 137 136 138 141  K 4.2 3.8 3.9 3.8 3.0*  CL 96 95* 94* 94* 89*  CO2 34* 30 35* 33* 44*  GLUCOSE 138* 128* 131* 109* 123*  BUN 10 12 15 16 14   CREATININE 0.66 0.60 0.75 0.59 0.57  CALCIUM 9.2 8.9 8.8 8.6 8.6    Liver Function Tests:  Recent Labs Lab 09/04/12 0445  AST 24  ALT 14  ALKPHOS 54  BILITOT 0.3  PROT 5.2*  ALBUMIN 2.4*   No results found for this basename: LIPASE, AMYLASE,  in the last 168 hours No results found for this basename: AMMONIA,  in the last 168 hours  CBC:  Recent Labs Lab 08/31/12 1303 09/01/12 0844 09/02/12 0345 09/03/12 0345 09/04/12 0445   WBC 15.8* 4.7 4.6 4.7 5.1  NEUTROABS 14.4*  --   --   --   --   HGB 10.7* 9.6* 9.8* 9.1* 9.7*  HCT 31.6* 28.4* 29.4* 28.8* 29.1*  MCV 92.1 90.7 93.0 93.2 92.1  PLT 306 196 193 202 199    Cardiac Enzymes:  Recent Labs Lab 08/31/12 1622 08/31/12 2227 09/01/12 0844  CKTOTAL  --   --  69  CKMB  --   --  5.9*  TROPONINI 0.73* 1.02* 0.66*   BNP (last 3 results)  Recent Labs  06/22/12 2005 07/26/12 1616 08/31/12 1303  PROBNP 3562.0* 1873.0* 2598.0*     CBG: No results found for this basename: GLUCAP,  in the last 168 hours  Recent Results (from the past 240 hour(s))  MRSA PCR SCREENING     Status: None   Collection Time    08/31/12  6:34 PM  Result Value Range Status   MRSA by PCR NEGATIVE  NEGATIVE Final   Comment:            The GeneXpert MRSA Assay (FDA     approved for NASAL specimens     only), is one component of a     comprehensive MRSA colonization     surveillance program. It is not     intended to diagnose MRSA     infection nor to guide or     monitor treatment for     MRSA infections.  CULTURE, BLOOD (ROUTINE X 2)     Status: None   Collection Time    08/31/12 10:26 PM      Result Value Range Status   Specimen Description BLOOD RIGHT HAND   Final   Special Requests     Final   Value: BOTTLES DRAWN AEROBIC ONLY 10CC PT ON PRIMAXIN ,DOXYCYCLINE   Culture  Setup Time 09/01/2012 05:42   Final   Culture     Final   Value:        BLOOD CULTURE RECEIVED NO GROWTH TO DATE CULTURE WILL BE HELD FOR 5 DAYS BEFORE ISSUING A FINAL NEGATIVE REPORT   Report Status PENDING   Incomplete  CULTURE, BLOOD (ROUTINE X 2)     Status: None   Collection Time    08/31/12 10:26 PM      Result Value Range Status   Specimen Description BLOOD RIGHT ARM   Final   Special Requests     Final   Value: BOTTLES DRAWN AEROBIC AND ANAEROBIC 10CC PT ON PRIMAXIN ,DOXYCYCLINE   Culture  Setup Time 09/01/2012 05:43   Final   Culture     Final   Value:        BLOOD CULTURE  RECEIVED NO GROWTH TO DATE CULTURE WILL BE HELD FOR 5 DAYS BEFORE ISSUING A FINAL NEGATIVE REPORT   Report Status PENDING   Incomplete     Studies: Ct Angio Chest Pe W/cm &/or Wo Cm  08/31/2012   *RADIOLOGY REPORT*  Clinical Data: Shortness of breath, COPD  CT ANGIOGRAPHY CHEST  Technique:  Multidetector CT imaging of the chest using the standard protocol during bolus administration of intravenous contrast. Multiplanar reconstructed images including MIPs were obtained and reviewed to evaluate the vascular anatomy.  Contrast: 80mL OMNIPAQUE IOHEXOL 350 MG/ML SOLN  Comparison: 08/31/2012 radiograph, 02/08/2012 CT  Findings: Respiratory motion degrades evaluation of the lower lobe segmental branches.  Otherwise, no pulmonary arterial branch filling defect is identified.  Normal caliber aorta with scattered atherosclerotic disease.  Heart size mildly enlarged.  Coronary artery calcification. Leftward mediastinal shift.  No intrathoracic lymphadenopathy.  Limited images through the upper abdomen show no acute finding.  Central airways are patent with mild lower lobe peribronchial thickening.  Mild bibasilar opacities.  No pneumothorax. Centrolobular emphysema.  Apical scarring.  Detailed parenchymal evaluation of the lung bases degraded by respiratory motion.  Diffuse osteopenia and multilevel degenerative changes.  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  IMPRESSION: Degraded by respiratory motion which obscures evaluation of the lower lobe segmental branches.  Allowing for this, no pulmonary embolism identified.  Cardiomegaly with coronary artery calcification.  COPD and bibasilar opacities; scarring, atelectasis, or infiltrate.   Original Report Authenticated By: Jearld Lesch, M.D.   Dg Chest Portable 1 View  08/31/2012   *RADIOLOGY REPORT*  Clinical Data: COPD, sleep apnea, shortness of breath.  PORTABLE CHEST - 1 VIEW  Comparison: 08/29/2012.  Findings:  Cardiomegaly again noted.  Mild perihilar interstitial prominence suspicious for pneumonitis or bronchitic changes.  No segmental infiltrate. Prior vertebroplasty again noted.  IMPRESSION: Cardiomegaly.  Mild perihilar interstitial prominence suspicious for pneumonitis or bronchitic changes.  No segmental infiltrate.   Original Report Authenticated By: Natasha Mead, M.D.    Scheduled Meds: . ipratropium  0.5 mg Nebulization Q6H   And  . albuterol  2.5 mg Nebulization Q6H  . antiseptic oral rinse  15 mL Mouth Rinse BID  . atorvastatin  40 mg Oral Daily  . buPROPion  150 mg Oral Daily  . citalopram  20 mg Oral Daily  . fenofibrate  160 mg Oral Daily  . folic acid  1 mg Oral Daily  . furosemide  80 mg Intravenous Once  . furosemide  40 mg Oral BID  . gabapentin  300 mg Oral 5 X Daily  . guaiFENesin  600 mg Oral BID  . imipenem-cilastatin  250 mg Intravenous Q6H  . methylPREDNISolone (SOLU-MEDROL) injection  60 mg Intravenous Q8H  . montelukast  10 mg Oral Daily  . pantoprazole  40 mg Oral Daily  . potassium chloride  40 mEq Oral TID  . rivaroxaban  20 mg Oral Q supper  . sodium chloride  3 mL Intravenous Q12H  . vancomycin  750 mg Intravenous Q12H   Continuous Infusions:   Principal Problem:   Respiratory failure, acute-on-chronic Active Problems:   SMOKER   COPD UNSPECIFIED   Coronary artery disease   Acute-on-chronic respiratory failure   COPD exacerbation   Elevated troponin   Acute on chronic combined systolic and diastolic CHF (congestive heart failure)    Time spent: 40 minutes   [facility]  [facility] Pager [phone]. If 8PM-8AM, please contact night-coverage at [email or web address], password [facility] 09/04/2012, 8:04 AM  LOS: 4 days

## 2012-09-05 ENCOUNTER — Telehealth: Payer: Self-pay | Admitting: Nurse Practitioner

## 2012-09-05 LAB — CBC
MCV: 93.1 fL (ref 78.0–100.0)
Platelets: 199 10*3/uL (ref 150–400)
RBC: 3.35 MIL/uL — ABNORMAL LOW (ref 3.87–5.11)
WBC: 5.5 10*3/uL (ref 4.0–10.5)

## 2012-09-05 LAB — BASIC METABOLIC PANEL
CO2: 44 mEq/L (ref 19–32)
Chloride: 90 mEq/L — ABNORMAL LOW (ref 96–112)
Creatinine, Ser: 0.58 mg/dL (ref 0.50–1.10)
GFR calc Af Amer: >90 mL/min (ref >90)
Potassium: 4.2 mEq/L (ref 3.5–5.1)
Sodium: 140 mEq/L (ref 135–145)

## 2012-09-05 NOTE — Progress Notes (Signed)
Patient ID: Aimee Sanders, female   DOB: [birth year], 67 y.o.   MRN: [id number]   SUBJECTIVE:  Patient is stable. She seems to be more frail. Potassium was treated and is now up to 4.2.   Filed Vitals:   09/04/12 2137 09/05/12 0519 09/05/12 0842 09/05/12 1027  BP:  118/79  119/57  Pulse:  77  72  Temp:  97.8 F (36.6 C)    TempSrc:  Oral    Resp:  20    Height:      Weight:  126 lb 1.7 oz (57.2 kg)    SpO2: 94% 99% 98% 98%    Intake/Output Summary (Last 24 hours) at 09/05/12 1400 Last data filed at 09/05/12 1031  Gross per 24 hour  Intake    740 ml  Output   2500 ml  Net  -1760 ml    LABS: Basic Metabolic Panel:  Recent Labs  69/62/95 0445 09/05/12 0420  NA 141 140  K 3.0* 4.2  CL 89* 90*  CO2 44* 44*  GLUCOSE 123* 106*  BUN 14 14  CREATININE 0.57 0.58  CALCIUM 8.6 9.0   Liver Function Tests:  Recent Labs  09/04/12 0445  AST 24  ALT 14  ALKPHOS 54  BILITOT 0.3  PROT 5.2*  ALBUMIN 2.4*   No results found for this basename: LIPASE, AMYLASE,  in the last 72 hours CBC:  Recent Labs  09/04/12 0445 09/05/12 0420  WBC 5.1 5.5  HGB 9.7* 10.4*  HCT 29.1* 31.2*  MCV 92.1 93.1  PLT 199 199   Cardiac Enzymes: No results found for this basename: CKTOTAL, CKMB, CKMBINDEX, TROPONINI,  in the last 72 hours BNP: No components found with this basename: POCBNP,  D-Dimer: No results found for this basename: DDIMER,  in the last 72 hours Hemoglobin A1C: No results found for this basename: HGBA1C,  in the last 72 hours Fasting Lipid Panel: No results found for this basename: CHOL, HDL, LDLCALC, TRIG, CHOLHDL, LDLDIRECT,  in the last 72 hours Thyroid Function Tests: No results found for this basename: TSH, T4TOTAL, FREET3, T3FREE, THYROIDAB,  in the last 72 hours  RADIOLOGY: Ct Angio Chest Pe W/cm &/or Wo Cm  08/31/2012   *RADIOLOGY REPORT*  Clinical Data: Shortness of breath, COPD  CT ANGIOGRAPHY CHEST  Technique:  Multidetector CT imaging of the chest  using the standard protocol during bolus administration of intravenous contrast. Multiplanar reconstructed images including MIPs were obtained and reviewed to evaluate the vascular anatomy.  Contrast: 80mL OMNIPAQUE IOHEXOL 350 MG/ML SOLN  Comparison: 08/31/2012 radiograph, 02/08/2012 CT  Findings: Respiratory motion degrades evaluation of the lower lobe segmental branches.  Otherwise, no pulmonary arterial branch filling defect is identified.  Normal caliber aorta with scattered atherosclerotic disease.  Heart size mildly enlarged.  Coronary artery calcification. Leftward mediastinal shift.  No intrathoracic lymphadenopathy.  Limited images through the upper abdomen show no acute finding.  Central airways are patent with mild lower lobe peribronchial thickening.  Mild bibasilar opacities.  No pneumothorax. Centrolobular emphysema.  Apical scarring.  Detailed parenchymal evaluation of the lung bases degraded by respiratory motion.  Diffuse osteopenia and multilevel degenerative changes.  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  IMPRESSION: Degraded by respiratory motion which obscures evaluation of the lower lobe segmental branches.  Allowing for this, no pulmonary embolism identified.  Cardiomegaly with coronary artery calcification.  COPD and bibasilar opacities; scarring, atelectasis, or infiltrate.   Original Report Authenticated By: Jearld Lesch, M.D.  Dg Chest Portable 1 View  08/31/2012   *RADIOLOGY REPORT*  Clinical Data: COPD, sleep apnea, shortness of breath.  PORTABLE CHEST - 1 VIEW  Comparison: 08/29/2012.  Findings: Cardiomegaly again noted.  Mild perihilar interstitial prominence suspicious for pneumonitis or bronchitic changes.  No segmental infiltrate. Prior vertebroplasty again noted.  IMPRESSION: Cardiomegaly.  Mild perihilar interstitial prominence suspicious for pneumonitis or bronchitic changes.  No segmental infiltrate.   Original Report  Authenticated By: Natasha Mead, M.D.    PHYSICAL EXAM  Patient is oriented to person time and place. Affect is normal. She has decreased breath sounds. There is no edema.   TELEMETRY: I reviewed telemetry today Sep 05, 2012. There sinus rhythm. She's not had any more ventricular tachycardia.   ASSESSMENT AND PLAN:    Coronary artery disease     Medical therapy of her coronary disease.    Hypokalemia      Continue to watch her potassium.    Acute-on-chronic respiratory failure     Elevated troponin       Medical therapy for her coronary disease.    Acute on chronic combined systolic and diastolic CHF (congestive heart failure)      Continue the same medications.    Ventricular tachycardia    Continue to watch her rhythm being sure that her potassium is okay.   Willa Rough 09/05/2012 2:00 PM

## 2012-09-05 NOTE — Progress Notes (Signed)
CRITICAL VALUE ALERT  Critical value received:  CO2 44  Date of notification:  09/05/12  Time of notification:  0530  Critical value read back:yes  Nurse who received alert:  Courtney Heys, RN  MD notified (1st page):  Lenny Pastel, NP  Time of first page:  0532  MD notified (2nd page):  Time of second page:  Responding MD:  Lenny Pastel, NP  Time MD responded:  [phone]

## 2012-09-05 NOTE — Progress Notes (Signed)
Physical Therapy Treatment Patient Details Name: Aimee Sanders MRN: [id number] DOB: 10/31/1945 Today's Date: 09/05/2012 Time: 4098-1191 PT Time Calculation (min): 16 min  PT Assessment / Plan / Recommendation Comments on Treatment Session  Pt making progress with mobility & PT goals at this date.  She reports she didnt sleep well last night but agreeable to participate in therapy.  Able to increase ambulation distance & required decreased (A) for all mobility    Follow Up Recommendations  Home health PT;Supervision - Intermittent     Does the patient have the potential to tolerate intense rehabilitation     Barriers to Discharge        Equipment Recommendations  None recommended by PT    Recommendations for Other Services    Frequency Min 3X/week   Plan Discharge plan remains appropriate    Precautions / Restrictions Restrictions Weight Bearing Restrictions: No   Pertinent Vitals/Pain 02 sats remained >90% entire session on 3L.      Mobility  Bed Mobility Bed Mobility: Supine to Sit;Sitting - Scoot to Edge of Bed;Sit to Supine Supine to Sit: 6: Modified independent (Device/Increase time);HOB flat Sitting - Scoot to Edge of Bed: 6: Modified independent (Device/Increase time) Sit to Supine: 6: Modified independent (Device/Increase time);HOB flat Transfers Transfers: Sit to Stand;Stand to Sit Sit to Stand: 5: Supervision;With upper extremity assist;From bed Stand to Sit: 5: Supervision;With upper extremity assist;To bed Details for Transfer Assistance: Cues for safe hand placement Ambulation/Gait Ambulation/Gait Assistance: 4: Min guard Ambulation Distance (Feet): 60 Feet Assistive device: Rolling walker Ambulation/Gait Assistance Details: Cues for pursed lip breathing, & tall posture.  Overall, pt steady.   Gait Pattern: Step-through pattern;Decreased stride length;Left circumduction Gait velocity: decreased due to old hip fx Stairs: No    Exercises General Exercises  - Lower Extremity Long Arc Quad: Both;10 reps;Strengthening Hip Flexion/Marching: Strengthening;Both;10 reps Toe Raises: Both;10 reps Heel Raises: Both;10 reps      PT Goals Acute Rehab PT Goals Time For Goal Achievement: 09/10/12 Potential to Achieve Goals: Good Pt will go Supine/Side to Sit: with modified independence PT Goal: Supine/Side to Sit - Progress: Met Pt will go Sit to Stand: with modified independence PT Goal: Sit to Stand - Progress: Progressing toward goal Pt will go Stand to Sit: with modified independence PT Goal: Stand to Sit - Progress: Progressing toward goal Pt will Ambulate: >150 feet;with modified independence;with rolling walker PT Goal: Ambulate - Progress: Progressing toward goal Pt will Go Up / Down Stairs: 3-5 stairs;with modified independence;with least restrictive assistive device  Visit Information  Last PT Received On: 09/05/12 Assistance Needed: +1    Subjective Data  Subjective: "I didnt get any sleep last night"   Cognition  Cognition Arousal/Alertness: Awake/alert Behavior During Therapy: WFL for tasks assessed/performed Overall Cognitive Status: Within Functional Limits for tasks assessed    Balance     End of Session PT - End of Session Equipment Utilized During Treatment: Gait belt;Oxygen (3L) Activity Tolerance: Patient tolerated treatment well Patient left: in bed;with call bell/phone within reach Nurse Communication: Mobility status     Verdell Face, [place] [phone] 09/05/2012

## 2012-09-05 NOTE — Progress Notes (Signed)
[facility] PROGRESS NOTE  Aimee Sanders:[id number] DOB: [birth year] DOA: 08/31/2012 PCP: Rudi Heap, MD  Assessment/Plan: Principal Problem:   Respiratory failure, acute-on-chronic Active Problems:   SMOKER   COPD UNSPECIFIED   Coronary artery disease   Hypokalemia   Acute-on-chronic respiratory failure   COPD exacerbation   Elevated troponin   Acute on chronic combined systolic and diastolic CHF (congestive heart failure)   Ventricular tachycardia    Brief narrative:  67 year old female with a history of COPD with continued tobacco use, on 3 L nasal cannula at home, DVT on xarelto, hyperlipidemia, and coronary artery disease who presented with 4 days of worsening shortness of breath. Patient had been complaining of a nonproductive cough without fevers or chills. She had also been complaining of chest discomfort at rest for 2 days. She denied any fevers, chills abdominal pain, dysuria, hematuria, hemoptysis. The patient continues to smoke 3-4 cigarettes per day. She has a history of 80 pack years. The patient endorsed compliance with her medications. Of note, the patient was recently admitted between 07/26/12 and 07/28/12 for pneumococcal PNA. Her urine streptococcal pneumonia antigen was positive. The patient was discharged with levofloxacin.  In the emergency department, the patient was noted to have some distress. The patient was given furosemide 80 mg IV x1, Solu-Medrol 125mg  with aerosolized albuterol and atrovent. The patient was placed on BiPAP. ABG showed 7.35/59/54/32. The patient was given Ativan 1 mg IV and morphine 2 mg IV which helped with her respirations. WBC was noted to be 15.8. BMP was unremarkable. ProBNP was 2598 and troponin was elevated at 0.21. Chest x-ray showed mild increased perihilar interstitial prominence concerning for pneumonitis.   Assessment/Plan:  Acute on chronic respiratory failure due to:  A) - acute COPD exac  B) - acute CHF exac  -see  individual issues discussed below  Acute bronchospastic COPD ecacerbation  Home O2 dependent at 3LPM at all times oxygen requirement stable - slowly responding to bronchodilators, empiric abx, and taper steroids today - counseled on need to d/c tobacco entirely   Bibasilar pulmonary interstitial infiltrates / B hilar prominence - ?CAP vs HCAP vs/ atx w/ bronchitis  -Blood cultures x2 sets  -empiric vancomycin and imipenem started 5/14  -patient develops a rash with PCN   Hypokalemia  Repeat orally and recheck BMP in the morning   Acute on chronic combined systolic and diastolic CHF  11/2011 - Echo: EF 40-45% w/ Gr 1 DD - no signif change on TTE this admit - care as per Cardiology - appears to be maximally diuresed at this time - No ACE/ARB at this time due to relative hypotension  Transition to oral Lasix   Mildly Elevated troponin w/ known CAD  -care as per Cardiology - no further acute evaluation planned    Current Hypotension with hx of Hypertension  -Hold beta blocker and Imdur at this time as the patient's blood pressure is soft - no further diuresis for now - follow trend   Anxiety  -Patient takes Xanax 0.5 mg every 6 hours when necessary  -Continue at this time   Tobacco abuse  -Tobacco cessation discussed   History of DVT lower extremities  -Patient was placed on heparin drip due to elevated troponin at time of admit  -resumed xarelto after discontinuing Heparin   Microcytic anemia  Likely due to chronic disease - Hgb was 10.6 in October 2013 - was guiac negative August 2013 - will check anemia panel in AM   Code Status:  FULL  Family Communication: none  Disposition Plan: Likely discharge tomorrow with home health  Consultants:  Cardiology  Procedures:  5/15 - TTE - Inferolateral hypokinesis - mild LVH - ejection fraction was 45% - left ventricular diastolic dysfunction  Antibiotics:  Vanc 5/14 >>  Primaxin 5/14 >>  DVT prophylaxis:  Xarelto     HPI/Subjective:  She continues to c/o sob, ventricular tachycardia yesterday   Objective: Filed Vitals:   09/04/12 1535 09/04/12 2049 09/04/12 2137 09/05/12 0519  BP:  104/62  118/79  Pulse:  84  77  Temp:  98.1 F (36.7 C)  97.8 F (36.6 C)  TempSrc:  Oral  Oral  Resp:  20  20  Height:      Weight:    57.2 kg (126 lb 1.7 oz)  SpO2: 97% 98% 94% 99%    Intake/Output Summary (Last 24 hours) at 09/05/12 0825 Last data filed at 09/05/12 0815  Gross per 24 hour  Intake    980 ml  Output   3800 ml  Net  -2820 ml    Exam:  HENT:  Head: Atraumatic.  Nose: Nose normal.  Mouth/Throat: Oropharynx is clear and moist.  Eyes: Conjunctivae are normal. Pupils are equal, round, and reactive to light. No scleral icterus.  Neck: Neck supple. No tracheal deviation present.  Cardiovascular: Normal rate, regular rhythm, normal heart sounds and intact distal pulses.  Pulmonary/Chest: Effort normal and breath sounds normal. No respiratory distress.  Abdominal: Soft. Normal appearance and bowel sounds are normal. She exhibits no distension. There is no tenderness.  Musculoskeletal: She exhibits no edema and no tenderness.  Neurological: She is alert. No cranial nerve deficit.    Data Reviewed: Basic Metabolic Panel:  Recent Labs Lab 09/01/12 0844 09/02/12 0345 09/03/12 0345 09/04/12 0445 09/05/12 0420  NA 137 136 138 141 140  K 3.8 3.9 3.8 3.0* 4.2  CL 95* 94* 94* 89* 90*  CO2 30 35* 33* 44* 44*  GLUCOSE 128* 131* 109* 123* 106*  BUN 12 15 16 14 14   CREATININE 0.60 0.75 0.59 0.57 0.58  CALCIUM 8.9 8.8 8.6 8.6 9.0    Liver Function Tests:  Recent Labs Lab 09/04/12 0445  AST 24  ALT 14  ALKPHOS 54  BILITOT 0.3  PROT 5.2*  ALBUMIN 2.4*   No results found for this basename: LIPASE, AMYLASE,  in the last 168 hours No results found for this basename: AMMONIA,  in the last 168 hours  CBC:  Recent Labs Lab 08/31/12 1303 09/01/12 0844 09/02/12 0345  09/03/12 0345 09/04/12 0445 09/05/12 0420  WBC 15.8* 4.7 4.6 4.7 5.1 5.5  NEUTROABS 14.4*  --   --   --   --   --   HGB 10.7* 9.6* 9.8* 9.1* 9.7* 10.4*  HCT 31.6* 28.4* 29.4* 28.8* 29.1* 31.2*  MCV 92.1 90.7 93.0 93.2 92.1 93.1  PLT 306 196 193 202 199 199    Cardiac Enzymes:  Recent Labs Lab 08/31/12 1622 08/31/12 2227 09/01/12 0844  CKTOTAL  --   --  69  CKMB  --   --  5.9*  TROPONINI 0.73* 1.02* 0.66*   BNP (last 3 results)  Recent Labs  06/22/12 2005 07/26/12 1616 08/31/12 1303  PROBNP 3562.0* 1873.0* 2598.0*     CBG: No results found for this basename: GLUCAP,  in the last 168 hours  Recent Results (from the past 240 hour(s))  MRSA PCR SCREENING     Status: None   Collection Time  08/31/12  6:34 PM      Result Value Range Status   MRSA by PCR NEGATIVE  NEGATIVE Final   Comment:            The GeneXpert MRSA Assay (FDA     approved for NASAL specimens     only), is one component of a     comprehensive MRSA colonization     surveillance program. It is not     intended to diagnose MRSA     infection nor to guide or     monitor treatment for     MRSA infections.  CULTURE, BLOOD (ROUTINE X 2)     Status: None   Collection Time    08/31/12 10:26 PM      Result Value Range Status   Specimen Description BLOOD RIGHT HAND   Final   Special Requests     Final   Value: BOTTLES DRAWN AEROBIC ONLY 10CC PT ON PRIMAXIN ,DOXYCYCLINE   Culture  Setup Time 09/01/2012 05:42   Final   Culture     Final   Value:        BLOOD CULTURE RECEIVED NO GROWTH TO DATE CULTURE WILL BE HELD FOR 5 DAYS BEFORE ISSUING A FINAL NEGATIVE REPORT   Report Status PENDING   Incomplete  CULTURE, BLOOD (ROUTINE X 2)     Status: None   Collection Time    08/31/12 10:26 PM      Result Value Range Status   Specimen Description BLOOD RIGHT ARM   Final   Special Requests     Final   Value: BOTTLES DRAWN AEROBIC AND ANAEROBIC 10CC PT ON PRIMAXIN ,DOXYCYCLINE   Culture  Setup Time  09/01/2012 05:43   Final   Culture     Final   Value:        BLOOD CULTURE RECEIVED NO GROWTH TO DATE CULTURE WILL BE HELD FOR 5 DAYS BEFORE ISSUING A FINAL NEGATIVE REPORT   Report Status PENDING   Incomplete     Studies: Ct Angio Chest Pe W/cm &/or Wo Cm  08/31/2012   *RADIOLOGY REPORT*  Clinical Data: Shortness of breath, COPD  CT ANGIOGRAPHY CHEST  Technique:  Multidetector CT imaging of the chest using the standard protocol during bolus administration of intravenous contrast. Multiplanar reconstructed images including MIPs were obtained and reviewed to evaluate the vascular anatomy.  Contrast: 80mL OMNIPAQUE IOHEXOL 350 MG/ML SOLN  Comparison: 08/31/2012 radiograph, 02/08/2012 CT  Findings: Respiratory motion degrades evaluation of the lower lobe segmental branches.  Otherwise, no pulmonary arterial branch filling defect is identified.  Normal caliber aorta with scattered atherosclerotic disease.  Heart size mildly enlarged.  Coronary artery calcification. Leftward mediastinal shift.  No intrathoracic lymphadenopathy.  Limited images through the upper abdomen show no acute finding.  Central airways are patent with mild lower lobe peribronchial thickening.  Mild bibasilar opacities.  No pneumothorax. Centrolobular emphysema.  Apical scarring.  Detailed parenchymal evaluation of the lung bases degraded by respiratory motion.  Diffuse osteopenia and multilevel degenerative changes.  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  IMPRESSION: Degraded by respiratory motion which obscures evaluation of the lower lobe segmental branches.  Allowing for this, no pulmonary embolism identified.  Cardiomegaly with coronary artery calcification.  COPD and bibasilar opacities; scarring, atelectasis, or infiltrate.   Original Report Authenticated By: Jearld Lesch, M.D.   Dg Chest Portable 1 View  08/31/2012   *RADIOLOGY REPORT*  Clinical Data: COPD, sleep apnea, shortness of  breath.  PORTABLE CHEST - 1 VIEW  Comparison: 08/29/2012.  Findings: Cardiomegaly again noted.  Mild perihilar interstitial prominence suspicious for pneumonitis or bronchitic changes.  No segmental infiltrate. Prior vertebroplasty again noted.  IMPRESSION: Cardiomegaly.  Mild perihilar interstitial prominence suspicious for pneumonitis or bronchitic changes.  No segmental infiltrate.   Original Report Authenticated By: Natasha Mead, M.D.    Scheduled Meds: . ipratropium  0.5 mg Nebulization Q6H   And  . albuterol  2.5 mg Nebulization Q6H  . antiseptic oral rinse  15 mL Mouth Rinse BID  . atorvastatin  40 mg Oral Daily  . benzonatate  200 mg Oral BID  . buPROPion  150 mg Oral Daily  . citalopram  20 mg Oral Daily  . fenofibrate  160 mg Oral Daily  . folic acid  1 mg Oral Daily  . furosemide  80 mg Intravenous Once  . furosemide  40 mg Oral BID  . gabapentin  300 mg Oral 5 X Daily  . guaiFENesin  600 mg Oral BID  . imipenem-cilastatin  250 mg Intravenous Q6H  . methylPREDNISolone (SOLU-MEDROL) injection  60 mg Intravenous Q12H  . montelukast  10 mg Oral Daily  . pantoprazole  40 mg Oral Daily  . rivaroxaban  20 mg Oral Q supper  . sodium chloride  3 mL Intravenous Q12H  . vancomycin  750 mg Intravenous Q12H   Continuous Infusions:   Principal Problem:   Respiratory failure, acute-on-chronic Active Problems:   SMOKER   COPD UNSPECIFIED   Coronary artery disease   Hypokalemia   Acute-on-chronic respiratory failure   COPD exacerbation   Elevated troponin   Acute on chronic combined systolic and diastolic CHF (congestive heart failure)   Ventricular tachycardia    Time spent: 40 minutes   [facility]  [facility] Pager [phone]. If 8PM-8AM, please contact night-coverage at [email or web address], password [facility] 09/05/2012, 8:25 AM  LOS: 5 days

## 2012-09-06 ENCOUNTER — Telehealth: Payer: Self-pay | Admitting: *Deleted

## 2012-09-06 DIAGNOSIS — I472 Ventricular tachycardia: Secondary | ICD-10-CM

## 2012-09-06 DIAGNOSIS — J449 Chronic obstructive pulmonary disease, unspecified: Secondary | ICD-10-CM

## 2012-09-06 LAB — CBC
HCT: 32.4 % — ABNORMAL LOW (ref 36.0–46.0)
Hemoglobin: 10.6 g/dL — ABNORMAL LOW (ref 12.0–15.0)
MCV: 92.3 fL (ref 78.0–100.0)
RBC: 3.51 MIL/uL — ABNORMAL LOW (ref 3.87–5.11)
WBC: 7.2 10*3/uL (ref 4.0–10.5)

## 2012-09-06 LAB — BASIC METABOLIC PANEL
BUN: 18 mg/dL (ref 6–23)
CO2: >45 mEq/L (ref 19–32)
Chloride: 89 mEq/L — ABNORMAL LOW (ref 96–112)
Creatinine, Ser: 0.63 mg/dL (ref 0.50–1.10)
Potassium: 3.9 mEq/L (ref 3.5–5.1)

## 2012-09-06 MED ORDER — SODIUM CHLORIDE 0.9 % IV SOLN
INTRAVENOUS | Status: AC
  Administered 2012-09-06: 12:00:00 via INTRAVENOUS

## 2012-09-06 MED ORDER — ALBUTEROL SULFATE (5 MG/ML) 0.5% IN NEBU
2.5000 mg | INHALATION_SOLUTION | Freq: Two times a day (BID) | RESPIRATORY_TRACT | Status: DC
  Administered 2012-09-06 (×2): 2.5 mg via RESPIRATORY_TRACT
  Filled 2012-09-06 (×2): qty 0.5

## 2012-09-06 MED ORDER — AZITHROMYCIN 500 MG PO TABS: 500.0000 mg | ORAL_TABLET | Freq: Every day | ORAL | Status: DC

## 2012-09-06 MED ORDER — PREDNISONE 10 MG PO TABS: ORAL_TABLET | ORAL | Status: DC

## 2012-09-06 MED ORDER — GUAIFENESIN ER 600 MG PO TB12: 600.0000 mg | ORAL_TABLET | Freq: Two times a day (BID) | ORAL | Status: AC

## 2012-09-06 MED ORDER — DOXYCYCLINE HYCLATE 50 MG PO CAPS: 100.0000 mg | ORAL_CAPSULE | Freq: Two times a day (BID) | ORAL | Status: DC

## 2012-09-06 MED ORDER — FUROSEMIDE 40 MG PO TABS: 40.0000 mg | ORAL_TABLET | Freq: Every day | ORAL | Status: DC

## 2012-09-06 MED ORDER — METOPROLOL TARTRATE 25 MG PO TABS: 25.0000 mg | ORAL_TABLET | Freq: Every morning | ORAL | Status: DC

## 2012-09-06 MED ORDER — IPRATROPIUM BROMIDE 0.02 % IN SOLN
0.5000 mg | Freq: Two times a day (BID) | RESPIRATORY_TRACT | Status: DC
  Administered 2012-09-06 (×2): 0.5 mg via RESPIRATORY_TRACT
  Filled 2012-09-06 (×2): qty 2.5

## 2012-09-06 MED ORDER — OXYCODONE-ACETAMINOPHEN 10-325 MG PO TABS: 1.0000 | ORAL_TABLET | ORAL | Status: DC | PRN

## 2012-09-06 MED ORDER — ALPRAZOLAM 0.5 MG PO TABS: 0.5000 mg | ORAL_TABLET | Freq: Four times a day (QID) | ORAL | Status: DC | PRN

## 2012-09-06 MED ORDER — FUROSEMIDE 40 MG PO TABS: 40.0000 mg | ORAL_TABLET | Freq: Two times a day (BID) | ORAL | Status: DC

## 2012-09-06 NOTE — Telephone Encounter (Signed)
rx ready for pickup 

## 2012-09-06 NOTE — Progress Notes (Signed)
Pt was discharged per wheelchair with O2 at 3L /nasal canula which she uses at home assisted by the Nurse tech. Discharged instruction given by nurse Hessie Diener

## 2012-09-06 NOTE — Progress Notes (Signed)
ANTIBIOTIC CONSULT NOTE - FOLLOW UP  Pharmacy Consult for imipenem, vancomycin Indication: rule out pneumonia  Allergies  Allergen Reactions  . Levofloxacin Hives  . Penicillins Hives and Itching    Patient Measurements: Height: 5\' 4"  (162.6 cm) Weight: 122 lb 11.2 oz (55.656 kg) IBW/kg (Calculated) : 54.7  Vital Signs: Temp: 97.9 F (36.6 C) (05/20 0532) Temp src: Oral (05/20 0532) BP: 103/52 mmHg (05/20 1054) Pulse Rate: 101 (05/20 1054) Intake/Output from previous day: 05/19 0701 - 05/20 0700 In: 2170 [P.O.:1820; IV Piggyback:350] Out: 5100 [Urine:5100] Intake/Output from this shift: Total I/O In: 240 [P.O.:240] Out: 1925 [Urine:1925]  Labs:  Recent Labs  09/04/12 0445 09/05/12 0420 09/06/12 0520  WBC 5.1 5.5 7.2  HGB 9.7* 10.4* 10.6*  PLT 199 199 203  CREATININE 0.57 0.58 0.63   Estimated Creatinine Clearance: 58.1 ml/min (by C-G formula based on Cr of 0.63).  Recent Labs  09/06/12 0910  VANCOTROUGH 21.8*     Assessment: 67 yo female with concern of PNA on vancomycin and imipenem. The vancomycin level today was 21.8 but drawn about 80 minutes early (actual vancomycin trough just below 20).  Patient noted for discharge on doxycycline.   5/14 Vanc>> 5/14 Primaxin>>  5/14 blood cx>>ngtd  Goal of Therapy:  Vancomycin trough level 15-20 mcg/ml  Plan:  -No vancomycin changes needed -Noted plans for doxycycline  Harland German, Pharm D 09/06/2012 1:11 PM

## 2012-09-06 NOTE — Progress Notes (Signed)
SUBJECTIVE: The patient is doing well today.  At this time, she denies chest pain, shortness of breath, or any new concerns.  She feels as though she is "wheezing" more this morning, but otherwise is anxious to go home.   I/O -2L yesterday, weight down 4 pounds.   Marland Kitchen ipratropium  0.5 mg Nebulization BID   And  . albuterol  2.5 mg Nebulization BID  . antiseptic oral rinse  15 mL Mouth Rinse BID  . atorvastatin  40 mg Oral Daily  . benzonatate  200 mg Oral BID  . buPROPion  150 mg Oral Daily  . citalopram  20 mg Oral Daily  . fenofibrate  160 mg Oral Daily  . folic acid  1 mg Oral Daily  . furosemide  80 mg Intravenous Once  . furosemide  40 mg Oral BID  . gabapentin  300 mg Oral 5 X Daily  . guaiFENesin  600 mg Oral BID  . imipenem-cilastatin  250 mg Intravenous Q6H  . methylPREDNISolone (SOLU-MEDROL) injection  60 mg Intravenous Q12H  . montelukast  10 mg Oral Daily  . pantoprazole  40 mg Oral Daily  . rivaroxaban  20 mg Oral Q supper  . sodium chloride  3 mL Intravenous Q12H  . vancomycin  750 mg Intravenous Q12H      OBJECTIVE: Physical Exam: Filed Vitals:   09/05/12 1500 09/05/12 1944 09/05/12 2149 09/06/12 0532  BP: 106/69  113/65 113/65  Pulse: 74  85 76  Temp: 97.7 F (36.5 C)  98.3 F (36.8 C) 97.9 F (36.6 C)  TempSrc: Oral  Oral Oral  Resp: 20  20 20   Height:      Weight:    122 lb 11.2 oz (55.656 kg)  SpO2: 98% 95% 94% 98%    Intake/Output Summary (Last 24 hours) at 09/06/12 0653 Last data filed at 09/06/12 0538  Gross per 24 hour  Intake   2170 ml  Output   5100 ml  Net  -2930 ml    Telemetry reveals sinus rhythm- no further VT  GEN- The patient is well appearing, alert and oriented x 3 today.   Head- normocephalic, atraumatic Eyes-  Sclera clear, conjunctiva pink Ears- hearing intact Oropharynx- clear Neck- supple  Lungs- diffuse expiratory wheezes with a prolonged expiratory phase, poor air movement Heart- Regular rate and rhythm, no  murmurs, rubs or gallops, PMI not laterally displaced GI- soft, NT, ND, + BS Extremities- no clubbing, cyanosis, or edema   LABS: Basic Metabolic Panel:  Recent Labs  16/10/96 0420 09/06/12 0520  NA 140 141  K 4.2 3.9  CL 90* 89*  CO2 44* >45*  GLUCOSE 106* 99  BUN 14 18  CREATININE 0.58 0.63  CALCIUM 9.0 9.3   Liver Function Tests:  Recent Labs  09/04/12 0445  AST 24  ALT 14  ALKPHOS 54  BILITOT 0.3  PROT 5.2*  ALBUMIN 2.4*   CBC:  Recent Labs  09/05/12 0420 09/06/12 0520  WBC 5.5 7.2  HGB 10.4* 10.6*  HCT 31.2* 32.4*  MCV 93.1 92.3  PLT 199 203    RADIOLOGY: Ct Angio Chest Pe W/cm &/or Wo Cm 08/31/2012   *RADIOLOGY REPORT*  Clinical Data: Shortness of breath, COPD  CT ANGIOGRAPHY CHEST  Technique:  Multidetector CT imaging of the chest using the standard protocol during bolus administration of intravenous contrast. Multiplanar reconstructed images including MIPs were obtained and reviewed to evaluate the vascular anatomy.  Contrast: 80mL OMNIPAQUE IOHEXOL 350 MG/ML  SOLN  Comparison: 08/31/2012 radiograph, 02/08/2012 CT  Findings: Respiratory motion degrades evaluation of the lower lobe segmental branches.  Otherwise, no pulmonary arterial branch filling defect is identified.  Normal caliber aorta with scattered atherosclerotic disease.  Heart size mildly enlarged.  Coronary artery calcification. Leftward mediastinal shift.  No intrathoracic lymphadenopathy.  Limited images through the upper abdomen show no acute finding.  Central airways are patent with mild lower lobe peribronchial thickening.  Mild bibasilar opacities.  No pneumothorax. Centrolobular emphysema.  Apical scarring.  Detailed parenchymal evaluation of the lung bases degraded by respiratory motion.  Diffuse osteopenia and multilevel degenerative changes.  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  IMPRESSION: Degraded by respiratory motion which obscures evaluation of  the lower lobe segmental branches.  Allowing for this, no pulmonary embolism identified.  Cardiomegaly with coronary artery calcification.  COPD and bibasilar opacities; scarring, atelectasis, or infiltrate.   Original Report Authenticated By: Jearld Lesch, M.D.   Dg Chest Portable 1 View 08/31/2012   *RADIOLOGY REPORT*  Clinical Data: COPD, sleep apnea, shortness of breath.  PORTABLE CHEST - 1 VIEW  Comparison: 08/29/2012.  Findings: Cardiomegaly again noted.  Mild perihilar interstitial prominence suspicious for pneumonitis or bronchitic changes.  No segmental infiltrate. Prior vertebroplasty again noted.  IMPRESSION: Cardiomegaly.  Mild perihilar interstitial prominence suspicious for pneumonitis or bronchitic changes.  No segmental infiltrate.   Original Report Authenticated By: Natasha Mead, M.D.    ASSESSMENT AND PLAN:  Principal Problem:   Respiratory failure, acute-on-chronic Active Problems:   SMOKER   COPD UNSPECIFIED   Coronary artery disease   Hypokalemia   Acute-on-chronic respiratory failure   COPD exacerbation   Elevated troponin   Acute on chronic combined systolic and diastolic CHF (congestive heart failure)   Ventricular tachycardia   1. Acute on chronic systolic dysfunction Appears to be near euvolemic Would keep Is and Os about even  2. Acute respiratory failure Primarily due to COPD Smoking cessation is advised  3. CAD No ischemic symptoms Continue current medical therapy  4. VT Asymptomatic No changes  No further inpatient CV workup planned Will see as needed Call with questions  Will schedule follow-up with Dr Myrtis Ser in [place]

## 2012-09-06 NOTE — Telephone Encounter (Signed)
Last filled 08/08/12

## 2012-09-06 NOTE — Telephone Encounter (Signed)
Need to have pharmacy request so I can see when was filled lastp

## 2012-09-06 NOTE — Progress Notes (Signed)
Occupational Therapy Treatment Patient Details Name: Aimee Sanders MRN: [id number] DOB: [birth year] Today's Date: 09/06/2012 Time: 8295-6213 OT Time Calculation (min): 19 min  OT Assessment / Plan / Recommendation Comments on Treatment Session Pt is making progress from last session; however new symptom of dizziness today upon standing.    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 Per pt this is not new; however she reported that she has an astigmatism--which she may have as well or she is getting it mixed up with nystagmus. 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 She reports she already sits to do as much as she can at home, that she does not take hot/steamy showers, and she lets do the housework and shopping to help her not get so tired.      OT Goals ADL Goals ADL Goal: Toilet Transfer - Progress: Progressing toward goals Miscellaneous OT Goals OT Goal: Miscellaneous Goal #1 - Progress: Met  Visit  Information  Last OT Received On: 09/06/12 Assistance Needed: +1    Subjective Data  Subjective: "My blood sugar is off" (looked at her blood sugars and no big variations were seen), she said," well they took it with me laying down, then sitting, then standing"  I informed her that this was her blood pressure which did drop from sit to stand      Cognition  Cognition Arousal/Alertness: Awake/alert Behavior During Therapy: WFL for tasks assessed/performed Overall Cognitive Status: Within Functional Limits for tasks assessed    Mobility  Bed Mobility Bed Mobility: Left Sidelying to Sit;Sit to Sidelying Left Left Sidelying to Sit: 6: Modified independent (Device/Increase time);With rails Sit to Sidelying Left: 6: Modified independent (Device/Increase time);With rail Transfers Transfers: Sit to Stand;Stand to Sit Sit to Stand: 4: Min guard;With upper extremity assist;From bed Stand to Sit: 4: Min guard;With upper extremity assist;With armrests;To chair/3-in-1 Details for Transfer Assistance: Min guard A today due to pt reports of dizziness when she stands          End of Session OT - End of Session Activity Tolerance:  (limited by dizziness upon standing) Patient left: in bed;with call bell/phone within reach       Evette Georges [phone] 09/06/2012, 1:36 PM

## 2012-09-06 NOTE — Progress Notes (Signed)
Pt d/c to home with son. D/c instructions and medication reviewed with PT. Pt states understanding. All Pt and son questions answered. Explain to family need to make appt with cardiology w/in 7days

## 2012-09-06 NOTE — Telephone Encounter (Signed)
Notified to call pharmacy and have them fax so we will have last refill date.

## 2012-09-06 NOTE — Telephone Encounter (Signed)
rx up front 

## 2012-09-06 NOTE — Telephone Encounter (Signed)
Please advise 

## 2012-09-06 NOTE — Discharge Summary (Signed)
Physician Discharge Summary  Aimee Sanders MRN: [id number] DOB/AGE: [birth year] 67 y.o.  PCP: Rudi Heap, MD   Admit date: 08/31/2012 Discharge date: 09/06/2012  Discharge Diagnoses:      Respiratory failure, acute-on-chronic Active Problems:   SMOKER   COPD UNSPECIFIED   Coronary artery disease   Hypokalemia   Acute-on-chronic respiratory failure   COPD exacerbation   Elevated troponin   Acute on chronic combined systolic and diastolic CHF (congestive heart failure)   Ventricular tachycardia     Medication List    TAKE these medications       albuterol (2.5 MG/3ML) 0.083% nebulizer solution  Commonly known as:  PROVENTIL  Take 2.5 mg by nebulization every 6 (six) hours as needed for wheezing.     ALPRAZolam 0.5 MG tablet  Commonly known as:  XANAX  Take 1 tablet (0.5 mg total) by mouth 4 (four) times daily as needed for anxiety.     atorvastatin 40 MG tablet  Commonly known as:  LIPITOR  Take 40 mg by mouth daily.     azithromycin 500 MG tablet  Commonly known as:  ZITHROMAX  Take 1 tablet (500 mg total) by mouth daily.     benzonatate 100 MG capsule  Commonly known as:  TESSALON  Take 200 mg by mouth 3 (three) times daily as needed for cough.     bisoprolol 5 MG tablet  Commonly known as:  ZEBETA  Take 2.5 mg by mouth daily.     buPROPion 150 MG 12 hr tablet  Commonly known as:  WELLBUTRIN SR  Take 150 mg by mouth daily.     citalopram 20 MG tablet  Commonly known as:  CELEXA  Take 20 mg by mouth daily.     doxycycline 50 MG capsule  Commonly known as:  VIBRAMYCIN  Take 2 capsules (100 mg total) by mouth 2 (two) times daily.     fenofibrate 160 MG tablet  Take 160 mg by mouth daily.     Fluticasone-Salmeterol 250-50 MCG/DOSE Aepb  Commonly known as:  ADVAIR  Inhale 1 puff into the lungs every 12 (twelve) hours.     folic acid 1 MG tablet  Commonly known as:  FOLVITE  Take 1 mg by mouth daily.     furosemide 40 MG tablet   Commonly known as:  LASIX  Take 1 tablet (40 mg total) by mouth   daily.     gabapentin 300 MG capsule  Commonly known as:  NEURONTIN  Take 300 mg by mouth 5 (five) times daily.     guaiFENesin 600 MG 12 hr tablet  Commonly known as:  MUCINEX  Take 1 tablet (600 mg total) by mouth 2 (two) times daily.     ipratropium-albuterol 0.5-2.5 (3) MG/3ML Soln  Commonly known as:  DUONEB  Take 3 mLs by nebulization 3 (three) times daily. For shortness of breath or wheezing     isosorbide mononitrate 30 MG 24 hr tablet  Commonly known as:  IMDUR  Take 30 mg by mouth daily.     metoprolol tartrate 25 MG tablet  Commonly known as:  LOPRESSOR  Take 25 mg by mouth 2 (two) times daily.     montelukast 10 MG tablet  Commonly known as:  SINGULAIR  Take 10 mg by mouth daily.     nitroGLYCERIN 0.4 MG SL tablet  Commonly known as:  NITROSTAT  Place 0.4 mg under the tongue every 5 (five) minutes as needed. For chest  pain     omeprazole 20 MG capsule  Commonly known as:  PRILOSEC  Take 20 mg by mouth daily.     ondansetron 4 MG tablet  Commonly known as:  ZOFRAN  Take 4 mg by mouth every 6 (six) hours as needed for nausea.     oxyCODONE 5 MG immediate release tablet  Commonly known as:  Oxy IR/ROXICODONE  Take 1 tablet (5 mg total) by mouth every 4 (four) hours as needed.     oxyCODONE-acetaminophen 10-325 MG per tablet  Commonly known as:  PERCOCET  Take 1 tablet by mouth every 4 (four) hours as needed for pain.     potassium chloride 10 MEQ tablet  Commonly known as:  K-DUR  Take 10 mEq by mouth daily.     predniSONE 10 MG tablet  Commonly known as:  DELTASONE  4 tablets for 3 days, 3 tablets for 3 days, 2 tablets for 3 days, one tablet for 3 days, half a tablet for 3 days, then continue     Rivaroxaban 20 MG Tabs  Commonly known as:  XARELTO  Take 1 tablet (20 mg total) by mouth daily.     tiotropium 18 MCG inhalation capsule  Commonly known as:  SPIRIVA  Place 18 mcg into  inhaler and inhale daily.        Discharge Condition: Stable Disposition: 01-Home or Self Care   Consults:     Significant Diagnostic Studies: Ct Angio Chest Pe W/cm &/or Wo Cm  08/31/2012   *RADIOLOGY REPORT*  Clinical Data: Shortness of breath, COPD  CT ANGIOGRAPHY CHEST  Technique:  Multidetector CT imaging of the chest using the standard protocol during bolus administration of intravenous contrast. Multiplanar reconstructed images including MIPs were obtained and reviewed to evaluate the vascular anatomy.  Contrast: 80mL OMNIPAQUE IOHEXOL 350 MG/ML SOLN  Comparison: 08/31/2012 radiograph, 02/08/2012 CT  Findings: Respiratory motion degrades evaluation of the lower lobe segmental branches.  Otherwise, no pulmonary arterial branch filling defect is identified.  Normal caliber aorta with scattered atherosclerotic disease.  Heart size mildly enlarged.  Coronary artery calcification. Leftward mediastinal shift.  No intrathoracic lymphadenopathy.  Limited images through the upper abdomen show no acute finding.  Central airways are patent with mild lower lobe peribronchial thickening.  Mild bibasilar opacities.  No pneumothorax. Centrolobular emphysema.  Apical scarring.  Detailed parenchymal evaluation of the lung bases degraded by respiratory motion.  Diffuse osteopenia and multilevel degenerative changes.  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  IMPRESSION: Degraded by respiratory motion which obscures evaluation of the lower lobe segmental branches.  Allowing for this, no pulmonary embolism identified.  Cardiomegaly with coronary artery calcification.  COPD and bibasilar opacities; scarring, atelectasis, or infiltrate.   Original Report Authenticated By: Jearld Lesch, M.D.   Dg Chest Portable 1 View  08/31/2012   *RADIOLOGY REPORT*  Clinical Data: COPD, sleep apnea, shortness of breath.  PORTABLE CHEST - 1 VIEW  Comparison: 08/29/2012.  Findings:  Cardiomegaly again noted.  Mild perihilar interstitial prominence suspicious for pneumonitis or bronchitic changes.  No segmental infiltrate. Prior vertebroplasty again noted.  IMPRESSION: Cardiomegaly.  Mild perihilar interstitial prominence suspicious for pneumonitis or bronchitic changes.  No segmental infiltrate.   Original Report Authenticated By: Natasha Mead, M.D.       Microbiology: Recent Results (from the past 240 hour(s))  MRSA PCR SCREENING     Status: None   Collection Time    08/31/12  6:34 PM  Result Value Range Status   MRSA by PCR NEGATIVE  NEGATIVE Final   Comment:            The GeneXpert MRSA Assay (FDA     approved for NASAL specimens     only), is one component of a     comprehensive MRSA colonization     surveillance program. It is not     intended to diagnose MRSA     infection nor to guide or     monitor treatment for     MRSA infections.  CULTURE, BLOOD (ROUTINE X 2)     Status: None   Collection Time    08/31/12 10:26 PM      Result Value Range Status   Specimen Description BLOOD RIGHT HAND   Final   Special Requests     Final   Value: BOTTLES DRAWN AEROBIC ONLY 10CC PT ON PRIMAXIN ,DOXYCYCLINE   Culture  Setup Time 09/01/2012 05:42   Final   Culture     Final   Value:        BLOOD CULTURE RECEIVED NO GROWTH TO DATE CULTURE WILL BE HELD FOR 5 DAYS BEFORE ISSUING A FINAL NEGATIVE REPORT   Report Status PENDING   Incomplete  CULTURE, BLOOD (ROUTINE X 2)     Status: None   Collection Time    08/31/12 10:26 PM      Result Value Range Status   Specimen Description BLOOD RIGHT ARM   Final   Special Requests     Final   Value: BOTTLES DRAWN AEROBIC AND ANAEROBIC 10CC PT ON PRIMAXIN ,DOXYCYCLINE   Culture  Setup Time 09/01/2012 05:43   Final   Culture     Final   Value:        BLOOD CULTURE RECEIVED NO GROWTH TO DATE CULTURE WILL BE HELD FOR 5 DAYS BEFORE ISSUING A FINAL NEGATIVE REPORT   Report Status PENDING   Incomplete     Labs: Results for  orders placed during the hospital encounter of 08/31/12 (from the past 48 hour(s))  CBC     Status: Abnormal   Collection Time    09/05/12  4:20 AM      Result Value Range   WBC 5.5  4.0 - 10.5 K/uL   RBC 3.35 (*) 3.87 - 5.11 MIL/uL   Hemoglobin 10.4 (*) 12.0 - 15.0 g/dL   HCT 16.1 (*) 09.6 - 04.5 %   MCV 93.1  78.0 - 100.0 fL   MCH 31.0  26.0 - 34.0 pg   MCHC 33.3  30.0 - 36.0 g/dL   RDW 40.9  81.1 - 91.4 %   Platelets 199  150 - 400 K/uL  BASIC METABOLIC PANEL     Status: Abnormal   Collection Time    09/05/12  4:20 AM      Result Value Range   Sodium 140  135 - 145 mEq/L   Potassium 4.2  3.5 - 5.1 mEq/L   Comment: DELTA CHECK NOTED   Chloride 90 (*) 96 - 112 mEq/L   CO2 44 (*) 19 - 32 mEq/L   Comment: CRITICAL RESULT CALLED TO, READ BACK BY AND VERIFIED WITH:     MUELLER L,RN 09/05/12 0526 WAYK   Glucose, Bld 106 (*) 70 - 99 mg/dL   BUN 14  6 - 23 mg/dL   Creatinine, Ser 7.82  0.50 - 1.10 mg/dL   Calcium 9.0  8.4 - 95.6 mg/dL   GFR calc non  Af Amer >90  >90 mL/min   GFR calc Af Amer >90  >90 mL/min   Comment:            The eGFR has been calculated     using the CKD EPI equation.     This calculation has not been     validated in all clinical     situations.     eGFR's persistently     <90 mL/min signify     possible Chronic Kidney Disease.  CBC     Status: Abnormal   Collection Time    09/06/12  5:20 AM      Result Value Range   WBC 7.2  4.0 - 10.5 K/uL   RBC 3.51 (*) 3.87 - 5.11 MIL/uL   Hemoglobin 10.6 (*) 12.0 - 15.0 g/dL   HCT 81.1 (*) 91.4 - 78.2 %   MCV 92.3  78.0 - 100.0 fL   MCH 30.2  26.0 - 34.0 pg   MCHC 32.7  30.0 - 36.0 g/dL   RDW 95.6  21.3 - 08.6 %   Platelets 203  150 - 400 K/uL  BASIC METABOLIC PANEL     Status: Abnormal   Collection Time    09/06/12  5:20 AM      Result Value Range   Sodium 141  135 - 145 mEq/L   Potassium 3.9  3.5 - 5.1 mEq/L   Chloride 89 (*) 96 - 112 mEq/L   CO2 >45 (*) 19 - 32 mEq/L   Comment: CRITICAL RESULT  CALLED TO, READ BACK BY AND VERIFIED WITH:     VENDIA W RN 08/07/12 0632 COSTELLO B   Glucose, Bld 99  70 - 99 mg/dL   BUN 18  6 - 23 mg/dL   Creatinine, Ser 5.78  0.50 - 1.10 mg/dL   Calcium 9.3  8.4 - 46.9 mg/dL   GFR calc non Af Amer >90  >90 mL/min   GFR calc Af Amer >90  >90 mL/min   Comment:            The eGFR has been calculated     using the CKD EPI equation.     This calculation has not been     validated in all clinical     situations.     eGFR's persistently     <90 mL/min signify     possible Chronic Kidney Disease.     HPI Brief narrative:  67 year old female with a history of COPD with continued tobacco use, on 3 L nasal cannula at home, DVT on xarelto, hyperlipidemia, and coronary artery disease who presented with 4 days of worsening shortness of breath. Patient had been complaining of a nonproductive cough without fevers or chills. She had also been complaining of chest discomfort at rest for 2 days. She denied any fevers, chills abdominal pain, dysuria, hematuria, hemoptysis. The patient continues to smoke 3-4 cigarettes per day. She has a history of 80 pack years. The patient endorsed compliance with her medications. Of note, the patient was recently admitted between 07/26/12 and 07/28/12 for pneumococcal PNA. Her urine streptococcal pneumonia antigen was positive. The patient was discharged with levofloxacin.  In the emergency department, the patient was noted to have some distress. The patient was given furosemide 80 mg IV x1, Solu-Medrol 125mg  with aerosolized albuterol and atrovent. The patient was placed on BiPAP. ABG showed 7.35/59/54/32. The patient was given Ativan 1 mg IV and morphine 2 mg IV which helped with her  respirations. WBC was noted to be 15.8. BMP was unremarkable. ProBNP was 2598 and troponin was elevated at 0.21. Chest x-ray showed mild increased perihilar interstitial prominence concerning for pneumonitis.      HOSPITAL COURSE: *  Acute  bronchospastic COPD ecacerbation  Home O2 dependent at 3LPM at all times oxygen requirement stable - slowly responding to bronchodilators, treated with vancomycin/imipenem for 6 days On transition to doxycycline azithromycin for another 4 days to complete a total of 10 days Received high-dose Solu-Medrol, taper by mouth steroids Will likely be steroid dependent at 5 mg a day Smoking cessation counseling strongly advised   Bibasilar pulmonary interstitial infiltrates / B hilar prominence - ?CAP vs HCAP vs/ atx w/ bronchitis  -Blood cultures x2 sets  -empiric vancomycin and imipenem     Hypokalemia  Repeat orally and recheck BMP in the morning   Acute on chronic combined systolic and diastolic CHF  11/2011 - Echo: EF 40-45% w/ Gr 1 DD - no signif change on TTE this admit - care as per Cardiology - appears to be maximally diuresed at this time - No ACE/ARB at this time due to relative hypotension  Transition to oral Lasix , 40 mg by mouth twice a day No ACE inhibitor given risk of renal insufficiency   Mildly Elevated troponin w/ known CAD  -care as per Cardiology - no further acute evaluation planned   Current Hypotension with hx of Hypertension  Patient's beta blocker and Imdur were held during this hospitalization We'll resume low dose metoprolol and discontinue bisoprolol and Imdur   Anxiety  -Patient takes Xanax 0.5 mg every 6 hours when necessary  -Continue at this time   Tobacco abuse  -Tobacco cessation discussed   History of DVT lower extremities  -Patient was placed on heparin drip due to elevated troponin at time of admit  -resumed xarelto after discontinuing Heparin   Microcytic anemia  Likely due to chronic disease - Hgb was 10.6 in October 2013 - was guiac negative August 2013 -     Code Status: FULL  Family Communication: none  Disposition Plan: Likely discharge tomorrow with home health       Discharge Exam:  Blood pressure 113/65, pulse 76,  temperature 97.9 F (36.6 C), temperature source Oral, resp. rate 20, height 5\' 4"  (1.626 m), weight 55.656 kg (122 lb 11.2 oz), last menstrual period 07/08/1972, SpO2 98.00%.   GEN- The patient is well appearing, alert and oriented x 3 today.  Head- normocephalic, atraumatic  Eyes- Sclera clear, conjunctiva pink  Ears- hearing intact  Oropharynx- clear  Neck- supple  Lungs- diffuse expiratory wheezes with a prolonged expiratory phase, poor air movement  Heart- Regular rate and rhythm, no murmurs, rubs or gallops, PMI not laterally displaced  GI- soft, NT, ND, + BS  Extremities- no clubbing, cyanosis, or edema       Signed: Chiyoko Torrico 09/06/2012, 9:20 AM

## 2012-09-06 NOTE — Progress Notes (Signed)
CRITICAL VALUE ALERT  Critical value received:  CO2  >45  Date of notification:  09/06/12  Time of notification:  0637  Critical value read back:yes  Nurse who received alert:  Leanor Kail  MD notified (1st page):  Kirtland Bouchard Schorr  Time of first page:  [phone]  MD notified (2nd page):  Time of second page:  Responding MD:  K.Schorr  Time MD responded:  [phone]

## 2012-09-07 LAB — CULTURE, BLOOD (ROUTINE X 2): Culture: NO GROWTH

## 2012-09-07 NOTE — Telephone Encounter (Signed)
Pt aware rx to be picked up

## 2012-09-08 ENCOUNTER — Telehealth: Payer: Self-pay | Admitting: Nurse Practitioner

## 2012-09-20 ENCOUNTER — Other Ambulatory Visit (HOSPITAL_COMMUNITY): Payer: Self-pay | Admitting: Nurse Practitioner

## 2012-09-21 ENCOUNTER — Other Ambulatory Visit: Payer: Self-pay

## 2012-09-21 NOTE — Telephone Encounter (Signed)
What d need antibiotics for?

## 2012-09-21 NOTE — Telephone Encounter (Signed)
Last seen 07/08/12  celexa last filled 07/06/12   Pharmacy requesting ABX's

## 2012-09-22 NOTE — Telephone Encounter (Signed)
Why is  she taking both antibiotics?????

## 2012-09-22 NOTE — Telephone Encounter (Signed)
Forward to the provider that normally sees this patient

## 2012-09-22 NOTE — Telephone Encounter (Signed)
PT ALREADY FILLED CELEXA- NO LONGER NEEDS ANTIBIOTIC

## 2012-09-22 NOTE — Telephone Encounter (Signed)
Forwqard this to  the provider that normally sees this patient

## 2012-09-22 NOTE — Telephone Encounter (Signed)
WAS IN [facility] 08/31/12. SEE HOSP ENCOUNTER

## 2012-09-22 NOTE — Telephone Encounter (Signed)
Please review

## 2012-09-23 NOTE — Telephone Encounter (Signed)
Why does patient need this- NTBS if needs antibiotics

## 2012-09-28 NOTE — Telephone Encounter (Signed)
Why do I keep getting request for this- patient was told NTBS if needs antibiotics

## 2012-09-28 NOTE — Telephone Encounter (Signed)
Pt getting better. She will schedule appt. If necessary later.

## 2012-10-05 ENCOUNTER — Telehealth: Payer: Self-pay | Admitting: Nurse Practitioner

## 2012-10-07 ENCOUNTER — Other Ambulatory Visit: Payer: Self-pay | Admitting: Nurse Practitioner

## 2012-10-07 MED ORDER — ALPRAZOLAM 0.5 MG PO TABS: 0.5000 mg | ORAL_TABLET | Freq: Four times a day (QID) | ORAL | Status: DC | PRN

## 2012-10-07 MED ORDER — OXYCODONE-ACETAMINOPHEN 10-325 MG PO TABS: 1.0000 | ORAL_TABLET | ORAL | Status: DC | PRN

## 2012-10-07 NOTE — Telephone Encounter (Signed)
Done earlier today by MMM

## 2012-10-24 ENCOUNTER — Other Ambulatory Visit: Payer: Self-pay

## 2012-10-24 MED ORDER — CITALOPRAM HYDROBROMIDE 20 MG PO TABS: 20.0000 mg | ORAL_TABLET | Freq: Every day | ORAL | Status: DC

## 2012-10-24 NOTE — Telephone Encounter (Signed)
Las seen 07/08/12  MMM

## 2012-10-31 ENCOUNTER — Telehealth: Payer: Self-pay | Admitting: Nurse Practitioner

## 2012-10-31 NOTE — Telephone Encounter (Signed)
NTBS.

## 2012-10-31 NOTE — Telephone Encounter (Signed)
Please advise 

## 2012-11-01 ENCOUNTER — Ambulatory Visit: Payer: Medicare Other | Admitting: Nurse Practitioner

## 2012-11-01 ENCOUNTER — Telehealth: Payer: Self-pay | Admitting: Nurse Practitioner

## 2012-11-01 NOTE — Telephone Encounter (Signed)
Bilateral lower extremity swelling.  Improves with elevation.  Hospitalized in May for CHF. She is taking her Lasix as prescribed.    Patient fell 4 days ago striking her elbow.  Now has swelling and discomfort.  Has not used anything OTC.  Also fell off the bed 2 days before this incident and struck her face on the floor. No LOC. Denies any headache, dizziness, or visual changes.    She had an appointment scheduled for today but cancelled it because while having a BM she became very nauseous.  Denies vomiting or presyncope.  She took a Zofran and the nausea has improved.   Encouraged her to move cautiously to avoid falls.  Apply ice to her elbow for 20 minutes several times a day.  May take 1-2 ibuprofen every 4-6 hrs for pain. Elevate legs to decrease swelling and continue to take Lasix as prescribed.  Appt scheduled for 11/03/12 with Bennie Pierini, FNP. Call if symptoms worsen.  Patient stated understanding and agreement to plan.

## 2012-11-01 NOTE — Telephone Encounter (Signed)
Appointment made today at 3

## 2012-11-02 NOTE — Telephone Encounter (Signed)
PT HAS APPT 7/17/14IN [facility]

## 2012-11-03 ENCOUNTER — Ambulatory Visit: Payer: Medicare Other | Admitting: Nurse Practitioner

## 2012-11-04 ENCOUNTER — Telehealth: Payer: Self-pay | Admitting: Nurse Practitioner

## 2012-11-04 ENCOUNTER — Other Ambulatory Visit: Payer: Self-pay | Admitting: Nurse Practitioner

## 2012-11-04 MED ORDER — OXYCODONE-ACETAMINOPHEN 10-325 MG PO TABS: 1.0000 | ORAL_TABLET | ORAL | Status: DC | PRN

## 2012-11-04 NOTE — Progress Notes (Signed)
Pt aware rx ready to be picked up °

## 2012-11-07 ENCOUNTER — Telehealth: Payer: Self-pay | Admitting: Nurse Practitioner

## 2012-11-09 ENCOUNTER — Encounter: Payer: Self-pay | Admitting: General Practice

## 2012-11-09 ENCOUNTER — Other Ambulatory Visit: Payer: Self-pay | Admitting: General Practice

## 2012-11-09 ENCOUNTER — Ambulatory Visit (INDEPENDENT_AMBULATORY_CARE_PROVIDER_SITE_OTHER): Payer: Medicare Other

## 2012-11-09 ENCOUNTER — Ambulatory Visit (INDEPENDENT_AMBULATORY_CARE_PROVIDER_SITE_OTHER): Payer: Medicare Other | Admitting: General Practice

## 2012-11-09 ENCOUNTER — Other Ambulatory Visit: Payer: Self-pay | Admitting: Nurse Practitioner

## 2012-11-09 VITALS — BP 130/77 | HR 58 | Temp 98.6°F | Ht 62.0 in | Wt 123.5 lb

## 2012-11-09 DIAGNOSIS — M545 Low back pain, unspecified: Secondary | ICD-10-CM

## 2012-11-09 MED ORDER — KETOROLAC TROMETHAMINE 30 MG/ML IJ SOLN
30.0000 mg | Freq: Once | INTRAMUSCULAR | Status: AC
  Administered 2012-11-09: 30 mg via INTRAMUSCULAR

## 2012-11-09 NOTE — Patient Instructions (Signed)

## 2012-11-09 NOTE — Telephone Encounter (Signed)
Pt requesting refill of xanax.

## 2012-11-09 NOTE — Telephone Encounter (Signed)
SPOKE WITH PATIENT AND SHE SAID YOU WERE GOING TO ADDRESS TODAY. SHE WAS SEEN BY YOU TODAY. THANKS.

## 2012-11-09 NOTE — Progress Notes (Signed)
  Subjective:    Patient ID: Aimee Sanders, female    DOB: [birth year], 67 y.o.   MRN: [id number]  HPI Patient presents today with complaints of pain. She reports falling on 11/03/12 at home due to slippery floor. She was seen at [facility] in the emergency room where she was treated and released. She has a history of chronic back pain and reports being out of pain medication.     Review of Systems  Constitutional: Negative for fever and chills.  Respiratory: Positive for shortness of breath. Negative for chest tightness and wheezing.   Cardiovascular: Negative for chest pain and palpitations.  Gastrointestinal: Negative for abdominal pain and blood in stool.  Genitourinary: Negative for hematuria and difficulty urinating.  Neurological: Negative for dizziness and headaches.       Objective:   Physical Exam  Constitutional: She is oriented to person, place, and time. She appears well-developed and well-nourished.  HENT:  Head: Normocephalic and atraumatic.  Cardiovascular: Normal rate, regular rhythm and normal heart sounds.   Pulmonary/Chest: Effort normal. No respiratory distress. She exhibits no tenderness.  Diminished throughout  Musculoskeletal: She exhibits tenderness.  Tenderness noted upon palpation to lower back  Neurological: She is alert and oriented to person, place, and time.  Skin: Skin is warm and dry.  Psychiatric: She has a normal mood and affect.   [facility] reading (PRIMARY) by Coralie Keens, FNP-C, no acute fracture noted.                                        Assessment & Plan:  1. Low back pain -ketorolac (TORADOL) 30 MG/ML injection 30 mg; Inject 1 mL (30 mg total) into the muscle once. -Take pain medications as prescribed -warm compresses to affected area for 10-15 minutes, three times daily -discussed safety and fall prevention in the home -RTO if symptoms worsen or seek emergency medical attention -Will consider referral to pain  management and/or otho if no improvement -patient and caregiver verbalized understanding -Coralie Keens, FNP-C

## 2012-11-10 ENCOUNTER — Other Ambulatory Visit: Payer: Self-pay | Admitting: General Practice

## 2012-11-10 MED ORDER — ALPRAZOLAM 0.5 MG PO TABS: 0.5000 mg | ORAL_TABLET | Freq: Four times a day (QID) | ORAL | Status: DC | PRN

## 2012-11-10 NOTE — Telephone Encounter (Signed)
Percocet already addressed by provider, ativan script written, and anticoagulant to be discussed with primary. Patient had 2 refills, so medication should last until August 6.

## 2012-11-10 NOTE — Telephone Encounter (Signed)
Mae refilled her xanax 11/10/12- She laready got refill on percocet before i left town.

## 2012-11-10 NOTE — Telephone Encounter (Signed)
Given during office visit

## 2012-11-16 ENCOUNTER — Telehealth: Payer: Self-pay | Admitting: *Deleted

## 2012-11-16 NOTE — Telephone Encounter (Signed)
PT CALLED IN AND WANTS TO HAVE BACK RCK FROM FALL THAT SHE SUSTAINED LAST WEEK. ALSO WANTS PROVIDER TO CHECK HER MOUTH. NO BLISTERS BUT IS SORE. APPT GIVEN FOR 11/17/12.

## 2012-11-17 ENCOUNTER — Ambulatory Visit (INDEPENDENT_AMBULATORY_CARE_PROVIDER_SITE_OTHER): Payer: Medicare Other | Admitting: General Practice

## 2012-11-17 ENCOUNTER — Encounter: Payer: Self-pay | Admitting: General Practice

## 2012-11-17 VITALS — BP 101/58 | HR 60 | Temp 98.3°F

## 2012-11-17 DIAGNOSIS — R229 Localized swelling, mass and lump, unspecified: Secondary | ICD-10-CM

## 2012-11-17 DIAGNOSIS — R109 Unspecified abdominal pain: Secondary | ICD-10-CM

## 2012-11-17 DIAGNOSIS — B370 Candidal stomatitis: Secondary | ICD-10-CM

## 2012-11-17 DIAGNOSIS — R142 Eructation: Secondary | ICD-10-CM

## 2012-11-17 DIAGNOSIS — D492 Neoplasm of unspecified behavior of bone, soft tissue, and skin: Secondary | ICD-10-CM

## 2012-11-17 DIAGNOSIS — R140 Abdominal distension (gaseous): Secondary | ICD-10-CM

## 2012-11-17 MED ORDER — NYSTATIN 100000 UNIT/ML MT SUSP: 500000.0000 [IU] | Freq: Four times a day (QID) | OROMUCOSAL | Status: DC

## 2012-11-17 NOTE — Progress Notes (Signed)
  Subjective:    Patient ID: Aimee Sanders, female    DOB: [birth year], 67 y.o.   MRN: [id number]  HPIPatient presents today with complaints of chronic back pain and abdominal swelling. She is accompanied by 2 daughters and grandson. Patient reports having a growth on abdomen. Also thrush in mouth. She denies taking OTC medications to treat thrush.     Review of Systems  Constitutional: Negative for fever and chills.  HENT:       Thrush in mouth  Respiratory: Positive for shortness of breath. Negative for chest tightness and wheezing.        O2 dependent  Cardiovascular: Positive for leg swelling. Negative for chest pain and palpitations.  Gastrointestinal: Positive for abdominal pain and abdominal distention. Negative for nausea, vomiting, diarrhea, constipation and blood in stool.  Genitourinary: Negative for hematuria, vaginal bleeding, difficulty urinating and pelvic pain.  Musculoskeletal: Positive for back pain.       Chronic back pain and fell a few weeks ago  Skin:       Growth from abdomen  Neurological: Negative for dizziness, weakness and light-headedness.       Objective:   Physical Exam  Constitutional: She is oriented to person, place, and time. She appears well-developed.  HENT:  Head: Normocephalic and atraumatic.  White thick coating on tongue, unable to remove with tongue blade, negative for lesions, or drainage  Cardiovascular: Normal rate, regular rhythm and normal heart sounds.   Pulmonary/Chest: No respiratory distress. She exhibits no tenderness.  Diminished breath sounds throughout and dyspnea with exertion  Abdominal: She exhibits distension. There is tenderness.  Beefy red colored growth (2.5 inches length X .5 inch width), foul odor, negative for drainage, non tender.   Neurological: She is alert and oriented to person, place, and time.  Skin: Skin is warm and dry.  Psychiatric: She has a normal mood and affect.          Assessment & Plan:  1.  Abdominal distention and 2. Abdominal  pain, other specified site - CT Abdomen Pelvis Wo Contrast; Future (awaiting approval from patients insurance) -Patient instructed to seek treatment at emergency department of choice immediately if symptoms worsen or no improvement, prior to authorization from insurance -Patient and daughters verbalized understanding   3. Skin growth - Ambulatory referral to General Surgery  4. Oral candidiasis - nystatin (MYCOSTATIN) 100000 UNIT/ML suspension; Take 5 mLs (500,000 Units total) by mouth 4 (four) times daily. Use 48 hours after symptoms resolved  Dispense: 120 mL; Refill: 0 -discussed proper hygiene -Patient and daughters verbalized understanding -Coralie Keens, FNP-C

## 2012-11-18 ENCOUNTER — Other Ambulatory Visit: Payer: Self-pay | Admitting: Nurse Practitioner

## 2012-11-18 NOTE — Addendum Note (Signed)
Addended by: Roselyn Reef on: 11/18/2012 04:57 PM   Modules accepted: Orders

## 2012-11-18 NOTE — Addendum Note (Signed)
Addended by: Roselyn Reef on: 11/18/2012 04:59 PM   Modules accepted: Orders

## 2012-11-20 LAB — AEROBIC CULTURE

## 2012-11-22 ENCOUNTER — Telehealth: Payer: Self-pay | Admitting: Nurse Practitioner

## 2012-11-23 ENCOUNTER — Other Ambulatory Visit: Payer: Self-pay | Admitting: General Practice

## 2012-11-23 ENCOUNTER — Other Ambulatory Visit: Payer: Self-pay | Admitting: Nurse Practitioner

## 2012-11-23 DIAGNOSIS — A498 Other bacterial infections of unspecified site: Secondary | ICD-10-CM

## 2012-11-23 DIAGNOSIS — R222 Localized swelling, mass and lump, trunk: Secondary | ICD-10-CM

## 2012-11-23 DIAGNOSIS — R1900 Intra-abdominal and pelvic swelling, mass and lump, unspecified site: Secondary | ICD-10-CM

## 2012-11-23 MED ORDER — SULFAMETHOXAZOLE-TMP DS 800-160 MG PO TABS: 1.0000 | ORAL_TABLET | Freq: Two times a day (BID) | ORAL | Status: DC

## 2012-11-23 NOTE — Telephone Encounter (Signed)
Family aware of culture results and script waiting at pharmacy.

## 2012-11-23 NOTE — Telephone Encounter (Signed)
Please inform that culture from growth on abdomen was positive for bacterial infection and septra DS script has been sent to pharmacy for pick up. It's important for patient to have CT of abdomen performed, asap.

## 2012-11-25 ENCOUNTER — Other Ambulatory Visit (HOSPITAL_COMMUNITY): Payer: Self-pay | Admitting: Nurse Practitioner

## 2012-11-29 ENCOUNTER — Other Ambulatory Visit: Payer: Self-pay

## 2012-11-29 MED ORDER — ISOSORBIDE MONONITRATE ER 30 MG PO TB24: 30.0000 mg | ORAL_TABLET | Freq: Every day | ORAL | Status: DC

## 2012-11-29 MED ORDER — FENOFIBRATE 160 MG PO TABS: 160.0000 mg | ORAL_TABLET | Freq: Every day | ORAL | Status: DC

## 2012-11-29 NOTE — Telephone Encounter (Signed)
Last seen 11/17/12  Aimee H.  Last lipids draw on 03/24/11

## 2012-11-30 ENCOUNTER — Other Ambulatory Visit: Payer: Self-pay | Admitting: General Practice

## 2012-11-30 ENCOUNTER — Telehealth: Payer: Self-pay | Admitting: Nurse Practitioner

## 2012-11-30 DIAGNOSIS — L989 Disorder of the skin and subcutaneous tissue, unspecified: Secondary | ICD-10-CM

## 2012-12-01 ENCOUNTER — Telehealth: Payer: Self-pay | Admitting: Nurse Practitioner

## 2012-12-01 ENCOUNTER — Other Ambulatory Visit: Payer: Self-pay | Admitting: General Practice

## 2012-12-01 MED ORDER — ISOSORBIDE MONONITRATE ER 30 MG PO TB24: 30.0000 mg | ORAL_TABLET | Freq: Every day | ORAL | Status: DC

## 2012-12-01 MED ORDER — FENOFIBRATE 160 MG PO TABS: 160.0000 mg | ORAL_TABLET | Freq: Every day | ORAL | Status: AC

## 2012-12-01 NOTE — Telephone Encounter (Signed)
This was intended for MMM per routing but I thought you should address?

## 2012-12-01 NOTE — Telephone Encounter (Signed)
Too early to write script.

## 2012-12-02 ENCOUNTER — Other Ambulatory Visit: Payer: Self-pay | Admitting: Nurse Practitioner

## 2012-12-02 ENCOUNTER — Encounter (INDEPENDENT_AMBULATORY_CARE_PROVIDER_SITE_OTHER): Payer: Self-pay | Admitting: Surgery

## 2012-12-02 ENCOUNTER — Telehealth: Payer: Self-pay | Admitting: Nurse Practitioner

## 2012-12-02 DIAGNOSIS — B370 Candidal stomatitis: Secondary | ICD-10-CM

## 2012-12-02 MED ORDER — MAGIC MOUTHWASH: 10.0000 mL | Freq: Four times a day (QID) | ORAL | Status: DC

## 2012-12-02 NOTE — Telephone Encounter (Signed)
Not time for pain medication to be filled

## 2012-12-02 NOTE — Telephone Encounter (Signed)
Already done

## 2012-12-02 NOTE — Telephone Encounter (Signed)
Last seen and filled 11/17/12   Mae

## 2012-12-02 NOTE — Telephone Encounter (Signed)
Wants mouth wash sent in to pharmacy for rash in mouth. Patient aware to soon to fill pain med

## 2012-12-05 ENCOUNTER — Other Ambulatory Visit: Payer: Self-pay | Admitting: *Deleted

## 2012-12-05 ENCOUNTER — Other Ambulatory Visit: Payer: Self-pay | Admitting: General Practice

## 2012-12-05 DIAGNOSIS — G8929 Other chronic pain: Secondary | ICD-10-CM

## 2012-12-05 DIAGNOSIS — B370 Candidal stomatitis: Secondary | ICD-10-CM

## 2012-12-05 MED ORDER — NYSTATIN 100000 UNIT/ML MT SUSP: 500000.0000 [IU] | Freq: Four times a day (QID) | OROMUCOSAL | Status: AC

## 2012-12-05 MED ORDER — OXYCODONE-ACETAMINOPHEN 10-325 MG PO TABS: 1.0000 | ORAL_TABLET | Freq: Three times a day (TID) | ORAL | Status: DC | PRN

## 2012-12-05 NOTE — Telephone Encounter (Signed)
Last seen 11/17/12  Aimee Sanders  Last filled 08/23/12

## 2012-12-07 ENCOUNTER — Telehealth: Payer: Self-pay | Admitting: Nurse Practitioner

## 2012-12-07 ENCOUNTER — Other Ambulatory Visit: Payer: Self-pay | Admitting: *Deleted

## 2012-12-07 MED ORDER — ALPRAZOLAM 0.5 MG PO TABS: 0.5000 mg | ORAL_TABLET | Freq: Four times a day (QID) | ORAL | Status: DC | PRN

## 2012-12-07 NOTE — Telephone Encounter (Signed)
Patient last seen in office on 11-17-12. Rx last filled on 11-10-12. Please advise. If approved please route to pool A so nurse can call in to [facility]

## 2012-12-07 NOTE — Progress Notes (Signed)
Family came by for prescription of pain meds.

## 2012-12-07 NOTE — Telephone Encounter (Signed)
Xanax rx ready for pick up  

## 2012-12-07 NOTE — Telephone Encounter (Signed)
Patient called and wanted to know hwy pain meds were decreased to only #90 from 120- Rx was filled by [facility] and not comfortable giving that many meds at a time- NTbs for future refills.

## 2012-12-07 NOTE — Telephone Encounter (Signed)
Patient Got #120 last month on 11/09/12 and she is already out of meds- can't get rx filled until 12/10/12- Patient veryupset but told is taking to many pain pills- not taking as rx- if this continues she will no longer be able to get meds from here.

## 2012-12-07 NOTE — Telephone Encounter (Signed)
Call patient to pick up Percocet

## 2012-12-08 ENCOUNTER — Telehealth: Payer: Self-pay | Admitting: Nurse Practitioner

## 2012-12-08 ENCOUNTER — Emergency Department (HOSPITAL_COMMUNITY): Payer: PRIVATE HEALTH INSURANCE

## 2012-12-08 ENCOUNTER — Other Ambulatory Visit: Payer: Self-pay

## 2012-12-08 ENCOUNTER — Encounter (HOSPITAL_COMMUNITY): Payer: Self-pay | Admitting: *Deleted

## 2012-12-08 ENCOUNTER — Inpatient Hospital Stay (HOSPITAL_COMMUNITY)
Admission: EM | Admit: 2012-12-08 | Discharge: 2012-12-17 | DRG: 291 | Disposition: A | Discharge status: Home / Self Care | Payer: PRIVATE HEALTH INSURANCE | Attending: Internal Medicine | Admitting: Internal Medicine

## 2012-12-08 DIAGNOSIS — I252 Old myocardial infarction: Secondary | ICD-10-CM

## 2012-12-08 DIAGNOSIS — J441 Chronic obstructive pulmonary disease with (acute) exacerbation: Secondary | ICD-10-CM | POA: Diagnosis present

## 2012-12-08 DIAGNOSIS — I219 Acute myocardial infarction, unspecified: Secondary | ICD-10-CM

## 2012-12-08 DIAGNOSIS — J4489 Other specified chronic obstructive pulmonary disease: Secondary | ICD-10-CM | POA: Diagnosis present

## 2012-12-08 DIAGNOSIS — J449 Chronic obstructive pulmonary disease, unspecified: Secondary | ICD-10-CM

## 2012-12-08 DIAGNOSIS — I5023 Acute on chronic systolic (congestive) heart failure: Secondary | ICD-10-CM

## 2012-12-08 DIAGNOSIS — Z9119 Patient's noncompliance with other medical treatment and regimen: Secondary | ICD-10-CM

## 2012-12-08 DIAGNOSIS — Z881 Allergy status to other antibiotic agents status: Secondary | ICD-10-CM

## 2012-12-08 DIAGNOSIS — I200 Unstable angina: Secondary | ICD-10-CM

## 2012-12-08 DIAGNOSIS — Z9861 Coronary angioplasty status: Secondary | ICD-10-CM

## 2012-12-08 DIAGNOSIS — E871 Hypo-osmolality and hyponatremia: Secondary | ICD-10-CM

## 2012-12-08 DIAGNOSIS — J9610 Chronic respiratory failure, unspecified whether with hypoxia or hypercapnia: Secondary | ICD-10-CM | POA: Diagnosis present

## 2012-12-08 DIAGNOSIS — E876 Hypokalemia: Secondary | ICD-10-CM | POA: Diagnosis not present

## 2012-12-08 DIAGNOSIS — Z96649 Presence of unspecified artificial hip joint: Secondary | ICD-10-CM

## 2012-12-08 DIAGNOSIS — F411 Generalized anxiety disorder: Secondary | ICD-10-CM | POA: Diagnosis present

## 2012-12-08 DIAGNOSIS — I5042 Chronic combined systolic (congestive) and diastolic (congestive) heart failure: Secondary | ICD-10-CM

## 2012-12-08 DIAGNOSIS — M5137 Other intervertebral disc degeneration, lumbosacral region: Secondary | ICD-10-CM | POA: Diagnosis present

## 2012-12-08 DIAGNOSIS — G8929 Other chronic pain: Secondary | ICD-10-CM

## 2012-12-08 DIAGNOSIS — Z803 Family history of malignant neoplasm of breast: Secondary | ICD-10-CM

## 2012-12-08 DIAGNOSIS — I472 Ventricular tachycardia: Secondary | ICD-10-CM

## 2012-12-08 DIAGNOSIS — I80299 Phlebitis and thrombophlebitis of other deep vessels of unspecified lower extremity: Secondary | ICD-10-CM | POA: Diagnosis present

## 2012-12-08 DIAGNOSIS — I2510 Atherosclerotic heart disease of native coronary artery without angina pectoris: Secondary | ICD-10-CM | POA: Diagnosis present

## 2012-12-08 DIAGNOSIS — D649 Anemia, unspecified: Secondary | ICD-10-CM

## 2012-12-08 DIAGNOSIS — F3289 Other specified depressive episodes: Secondary | ICD-10-CM | POA: Diagnosis present

## 2012-12-08 DIAGNOSIS — I509 Heart failure, unspecified: Secondary | ICD-10-CM | POA: Diagnosis present

## 2012-12-08 DIAGNOSIS — Z8249 Family history of ischemic heart disease and other diseases of the circulatory system: Secondary | ICD-10-CM

## 2012-12-08 DIAGNOSIS — Z8261 Family history of arthritis: Secondary | ICD-10-CM

## 2012-12-08 DIAGNOSIS — D638 Anemia in other chronic diseases classified elsewhere: Secondary | ICD-10-CM | POA: Diagnosis present

## 2012-12-08 DIAGNOSIS — J9620 Acute and chronic respiratory failure, unspecified whether with hypoxia or hypercapnia: Secondary | ICD-10-CM

## 2012-12-08 DIAGNOSIS — Z87891 Personal history of nicotine dependence: Secondary | ICD-10-CM

## 2012-12-08 DIAGNOSIS — F17200 Nicotine dependence, unspecified, uncomplicated: Secondary | ICD-10-CM

## 2012-12-08 DIAGNOSIS — J189 Pneumonia, unspecified organism: Secondary | ICD-10-CM | POA: Diagnosis present

## 2012-12-08 DIAGNOSIS — Z9089 Acquired absence of other organs: Secondary | ICD-10-CM

## 2012-12-08 DIAGNOSIS — I6789 Other cerebrovascular disease: Secondary | ICD-10-CM

## 2012-12-08 DIAGNOSIS — I4949 Other premature depolarization: Secondary | ICD-10-CM | POA: Diagnosis not present

## 2012-12-08 DIAGNOSIS — IMO0002 Reserved for concepts with insufficient information to code with codable children: Secondary | ICD-10-CM

## 2012-12-08 DIAGNOSIS — I959 Hypotension, unspecified: Secondary | ICD-10-CM

## 2012-12-08 DIAGNOSIS — R6251 Failure to thrive (child): Secondary | ICD-10-CM

## 2012-12-08 DIAGNOSIS — D689 Coagulation defect, unspecified: Secondary | ICD-10-CM

## 2012-12-08 DIAGNOSIS — Z9981 Dependence on supplemental oxygen: Secondary | ICD-10-CM

## 2012-12-08 DIAGNOSIS — Z602 Problems related to living alone: Secondary | ICD-10-CM

## 2012-12-08 DIAGNOSIS — M549 Dorsalgia, unspecified: Secondary | ICD-10-CM

## 2012-12-08 DIAGNOSIS — Z91199 Patient's noncompliance with other medical treatment and regimen due to unspecified reason: Secondary | ICD-10-CM

## 2012-12-08 DIAGNOSIS — I313 Pericardial effusion (noninflammatory): Secondary | ICD-10-CM

## 2012-12-08 DIAGNOSIS — Z79899 Other long term (current) drug therapy: Secondary | ICD-10-CM

## 2012-12-08 DIAGNOSIS — Z8673 Personal history of transient ischemic attack (TIA), and cerebral infarction without residual deficits: Secondary | ICD-10-CM

## 2012-12-08 DIAGNOSIS — M51379 Other intervertebral disc degeneration, lumbosacral region without mention of lumbar back pain or lower extremity pain: Secondary | ICD-10-CM | POA: Diagnosis present

## 2012-12-08 DIAGNOSIS — Z7901 Long term (current) use of anticoagulants: Secondary | ICD-10-CM

## 2012-12-08 DIAGNOSIS — J154 Pneumonia due to other streptococci: Secondary | ICD-10-CM

## 2012-12-08 DIAGNOSIS — Z886 Allergy status to analgesic agent status: Secondary | ICD-10-CM

## 2012-12-08 DIAGNOSIS — F329 Major depressive disorder, single episode, unspecified: Secondary | ICD-10-CM | POA: Diagnosis present

## 2012-12-08 DIAGNOSIS — I2589 Other forms of chronic ischemic heart disease: Secondary | ICD-10-CM | POA: Diagnosis present

## 2012-12-08 DIAGNOSIS — Z880 Allergy status to penicillin: Secondary | ICD-10-CM

## 2012-12-08 DIAGNOSIS — Z86718 Personal history of other venous thrombosis and embolism: Secondary | ICD-10-CM

## 2012-12-08 DIAGNOSIS — I739 Peripheral vascular disease, unspecified: Secondary | ICD-10-CM | POA: Diagnosis present

## 2012-12-08 DIAGNOSIS — G4730 Sleep apnea, unspecified: Secondary | ICD-10-CM | POA: Diagnosis present

## 2012-12-08 DIAGNOSIS — I5043 Acute on chronic combined systolic (congestive) and diastolic (congestive) heart failure: Principal | ICD-10-CM | POA: Diagnosis present

## 2012-12-08 DIAGNOSIS — E785 Hyperlipidemia, unspecified: Secondary | ICD-10-CM

## 2012-12-08 DIAGNOSIS — R911 Solitary pulmonary nodule: Secondary | ICD-10-CM

## 2012-12-08 LAB — BASIC METABOLIC PANEL
BUN: 22 mg/dL (ref 6–23)
Chloride: 92 mEq/L — ABNORMAL LOW (ref 96–112)
Creatinine, Ser: 0.77 mg/dL (ref 0.50–1.10)
Glucose, Bld: 91 mg/dL (ref 70–99)
Potassium: 3.6 mEq/L (ref 3.5–5.1)

## 2012-12-08 LAB — POCT I-STAT TROPONIN I

## 2012-12-08 LAB — CBC WITH DIFFERENTIAL/PLATELET
Basophils Relative: 0 % (ref 0–1)
Eosinophils Absolute: 0 10*3/uL (ref 0.0–0.7)
HCT: 32.2 % — ABNORMAL LOW (ref 36.0–46.0)
Hemoglobin: 10.6 g/dL — ABNORMAL LOW (ref 12.0–15.0)
Lymphs Abs: 2.8 10*3/uL (ref 0.7–4.0)
MCH: 30.2 pg (ref 26.0–34.0)
MCHC: 32.9 g/dL (ref 30.0–36.0)
Monocytes Absolute: 1 10*3/uL (ref 0.1–1.0)
Monocytes Relative: 9 % (ref 3–12)
Neutro Abs: 7.1 10*3/uL (ref 1.7–7.7)
Neutrophils Relative %: 65 % (ref 43–77)
RBC: 3.51 MIL/uL — ABNORMAL LOW (ref 3.87–5.11)

## 2012-12-08 LAB — TROPONIN I: Troponin I: <0.3 ng/mL (ref <0.30)

## 2012-12-08 MED ORDER — SODIUM CHLORIDE 0.9 % IJ SOLN
3.0000 mL | Freq: Two times a day (BID) | INTRAMUSCULAR | Status: DC
  Administered 2012-12-09 – 2012-12-17 (×15): 3 mL via INTRAVENOUS

## 2012-12-08 MED ORDER — GABAPENTIN 300 MG PO CAPS
300.0000 mg | ORAL_CAPSULE | Freq: Three times a day (TID) | ORAL | Status: DC
  Administered 2012-12-09 – 2012-12-17 (×26): 300 mg via ORAL
  Filled 2012-12-08 (×28): qty 1

## 2012-12-08 MED ORDER — GABAPENTIN 300 MG PO CAPS: 300.0000 mg | ORAL_CAPSULE | Freq: Four times a day (QID) | ORAL | Status: DC

## 2012-12-08 MED ORDER — OXYCODONE-ACETAMINOPHEN 10-325 MG PO TABS: 1.0000 | ORAL_TABLET | Freq: Three times a day (TID) | ORAL | Status: DC | PRN

## 2012-12-08 MED ORDER — OXYCODONE HCL 5 MG PO TABS
5.0000 mg | ORAL_TABLET | Freq: Three times a day (TID) | ORAL | Status: DC | PRN
  Administered 2012-12-08 – 2012-12-09 (×2): 5 mg via ORAL
  Filled 2012-12-08 (×2): qty 1

## 2012-12-08 MED ORDER — ALBUTEROL SULFATE (5 MG/ML) 0.5% IN NEBU
2.5000 mg | INHALATION_SOLUTION | Freq: Three times a day (TID) | RESPIRATORY_TRACT | Status: DC
  Administered 2012-12-09 – 2012-12-17 (×24): 2.5 mg via RESPIRATORY_TRACT
  Filled 2012-12-08 (×25): qty 0.5

## 2012-12-08 MED ORDER — ONDANSETRON HCL 4 MG/2ML IJ SOLN: 4.0000 mg | Freq: Four times a day (QID) | INTRAMUSCULAR | Status: DC | PRN

## 2012-12-08 MED ORDER — MOMETASONE FURO-FORMOTEROL FUM 100-5 MCG/ACT IN AERO
2.0000 | INHALATION_SPRAY | Freq: Two times a day (BID) | RESPIRATORY_TRACT | Status: DC
  Administered 2012-12-09 – 2012-12-17 (×17): 2 via RESPIRATORY_TRACT
  Filled 2012-12-08 (×2): qty 8.8

## 2012-12-08 MED ORDER — ACETAMINOPHEN 325 MG PO TABS
650.0000 mg | ORAL_TABLET | Freq: Four times a day (QID) | ORAL | Status: DC | PRN
  Administered 2012-12-13: 650 mg via ORAL
  Filled 2012-12-08: qty 2

## 2012-12-08 MED ORDER — ONDANSETRON HCL 4 MG PO TABS: 4.0000 mg | ORAL_TABLET | Freq: Four times a day (QID) | ORAL | Status: DC | PRN

## 2012-12-08 MED ORDER — FUROSEMIDE 10 MG/ML IJ SOLN
40.0000 mg | Freq: Once | INTRAMUSCULAR | Status: AC
  Administered 2012-12-08: 40 mg via INTRAVENOUS
  Filled 2012-12-08: qty 4

## 2012-12-08 MED ORDER — MORPHINE SULFATE 4 MG/ML IJ SOLN
4.0000 mg | Freq: Once | INTRAMUSCULAR | Status: AC
  Administered 2012-12-08: 4 mg via INTRAVENOUS
  Filled 2012-12-08: qty 1

## 2012-12-08 MED ORDER — ALPRAZOLAM 0.5 MG PO TABS
0.5000 mg | ORAL_TABLET | Freq: Four times a day (QID) | ORAL | Status: DC | PRN
  Administered 2012-12-09 – 2012-12-16 (×11): 0.5 mg via ORAL
  Filled 2012-12-08 (×11): qty 1

## 2012-12-08 MED ORDER — ALBUTEROL SULFATE (5 MG/ML) 0.5% IN NEBU
2.5000 mg | INHALATION_SOLUTION | RESPIRATORY_TRACT | Status: DC | PRN
  Administered 2012-12-15: 2.5 mg via RESPIRATORY_TRACT

## 2012-12-08 MED ORDER — FUROSEMIDE 10 MG/ML IJ SOLN
40.0000 mg | Freq: Three times a day (TID) | INTRAMUSCULAR | Status: DC
  Administered 2012-12-09: 40 mg via INTRAVENOUS
  Filled 2012-12-08 (×4): qty 4

## 2012-12-08 MED ORDER — METOPROLOL TARTRATE 25 MG PO TABS
25.0000 mg | ORAL_TABLET | Freq: Every day | ORAL | Status: DC
  Administered 2012-12-09: 25 mg via ORAL
  Filled 2012-12-08 (×2): qty 1

## 2012-12-08 MED ORDER — CITALOPRAM HYDROBROMIDE 20 MG PO TABS
20.0000 mg | ORAL_TABLET | Freq: Every day | ORAL | Status: DC
  Administered 2012-12-09 – 2012-12-17 (×9): 20 mg via ORAL
  Filled 2012-12-08 (×9): qty 1

## 2012-12-08 MED ORDER — NITROGLYCERIN 0.4 MG SL SUBL: 0.4000 mg | SUBLINGUAL_TABLET | SUBLINGUAL | Status: DC | PRN

## 2012-12-08 MED ORDER — OXYCODONE-ACETAMINOPHEN 5-325 MG PO TABS
1.0000 | ORAL_TABLET | Freq: Three times a day (TID) | ORAL | Status: DC | PRN
  Administered 2012-12-09: 1 via ORAL
  Filled 2012-12-08: qty 1

## 2012-12-08 MED ORDER — ACETAMINOPHEN 650 MG RE SUPP: 650.0000 mg | Freq: Four times a day (QID) | RECTAL | Status: DC | PRN

## 2012-12-08 MED ORDER — SODIUM CHLORIDE 0.9 % IJ SOLN
3.0000 mL | Freq: Two times a day (BID) | INTRAMUSCULAR | Status: DC
  Administered 2012-12-08 – 2012-12-16 (×8): 3 mL via INTRAVENOUS

## 2012-12-08 MED ORDER — RIVAROXABAN 20 MG PO TABS
20.0000 mg | ORAL_TABLET | Freq: Every day | ORAL | Status: DC
  Administered 2012-12-09 – 2012-12-15 (×7): 20 mg via ORAL
  Filled 2012-12-08 (×8): qty 1

## 2012-12-08 MED ORDER — FENOFIBRATE 160 MG PO TABS
160.0000 mg | ORAL_TABLET | Freq: Every day | ORAL | Status: DC
  Administered 2012-12-09 – 2012-12-16 (×8): 160 mg via ORAL
  Filled 2012-12-08 (×9): qty 1

## 2012-12-08 MED ORDER — GABAPENTIN 300 MG PO CAPS
600.0000 mg | ORAL_CAPSULE | Freq: Every day | ORAL | Status: DC
  Administered 2012-12-08 – 2012-12-16 (×9): 600 mg via ORAL
  Filled 2012-12-08 (×10): qty 2

## 2012-12-08 MED ORDER — ISOSORBIDE MONONITRATE ER 30 MG PO TB24
30.0000 mg | ORAL_TABLET | Freq: Every day | ORAL | Status: DC
  Administered 2012-12-09: 30 mg via ORAL
  Filled 2012-12-08: qty 1

## 2012-12-08 MED ORDER — IPRATROPIUM BROMIDE 0.02 % IN SOLN
0.5000 mg | Freq: Three times a day (TID) | RESPIRATORY_TRACT | Status: DC
  Administered 2012-12-09 – 2012-12-17 (×24): 0.5 mg via RESPIRATORY_TRACT
  Filled 2012-12-08 (×2): qty 2.5
  Filled 2012-12-08: qty 7.5
  Filled 2012-12-08 (×2): qty 2.5
  Filled 2012-12-08: qty 7.5
  Filled 2012-12-08: qty 2.5
  Filled 2012-12-08: qty 7.5
  Filled 2012-12-08 (×2): qty 2.5
  Filled 2012-12-08 (×2): qty 7.5
  Filled 2012-12-08 (×10): qty 2.5
  Filled 2012-12-08: qty 7.5
  Filled 2012-12-08: qty 2.5

## 2012-12-08 MED ORDER — MONTELUKAST SODIUM 10 MG PO TABS
10.0000 mg | ORAL_TABLET | Freq: Every day | ORAL | Status: DC
  Administered 2012-12-09 – 2012-12-17 (×9): 10 mg via ORAL
  Filled 2012-12-08 (×9): qty 1

## 2012-12-08 MED ORDER — IPRATROPIUM-ALBUTEROL 0.5-2.5 (3) MG/3ML IN SOLN: 3.0000 mL | Freq: Three times a day (TID) | RESPIRATORY_TRACT | Status: DC

## 2012-12-08 MED ORDER — NYSTATIN 100000 UNIT/ML MT SUSP
500000.0000 [IU] | Freq: Four times a day (QID) | OROMUCOSAL | Status: DC
  Administered 2012-12-08 – 2012-12-17 (×33): 500000 [IU] via ORAL
  Filled 2012-12-08 (×37): qty 5

## 2012-12-08 MED ORDER — PANTOPRAZOLE SODIUM 40 MG PO TBEC
40.0000 mg | DELAYED_RELEASE_TABLET | Freq: Every day | ORAL | Status: DC
  Administered 2012-12-09 – 2012-12-17 (×9): 40 mg via ORAL
  Filled 2012-12-08 (×7): qty 1

## 2012-12-08 NOTE — ED Provider Notes (Signed)
CSN: [id number]     Arrival date & time 12/08/12  1843 History     First MD Initiated Contact with Patient 12/08/12 1843     Chief Complaint  Patient presents with  . Shortness of Breath  . Leg Swelling   (Consider location/radiation/quality/duration/timing/severity/associated sxs/prior Treatment) Patient is a 67 y.o. female presenting with shortness of breath. The history is provided by the patient.  Shortness of Breath Severity:  Moderate Onset quality:  Gradual Duration:  2 days Timing:  Constant Progression:  Worsening Chronicity:  Recurrent Relieved by:  Sitting up Worsened by:  Exertion (lying flat) Ineffective treatments:  None tried Associated symptoms: no abdominal pain, no chest pain, no cough, no diaphoresis, no ear pain, no fever, no headaches, no neck pain, no rash, no sore throat, no vomiting and no wheezing     Past Medical History  Diagnosis Date  . Chronic combined systolic and diastolic CHF (congestive heart failure)     11/2011 - Echo: EF 40-45% w/ Gr 1 DD.  Marland Kitchen Deep vein thrombosis     Chronic Xarelto.  . Hyperlipidemia   . Sleep apnea   . 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 Coronary atherosclerosis of native coronary artery     a. reportedly s/p MI x 2;  b. March 2014 Cardiolite: EF 68% with scar affecting the inferolateral and anterolateral wall; mild ischemia in the mid-septum-> Med Rx.  . Chronic pain     Due to prior hip fracture and DDD back  . Peripheral vascular disease   . Pneumonia     "now and several times before" (07/26/2012)  . H/O hiatal hernia   . Stroke 2002; 2004    Denies residual; "both really light strokes" (07/26/2012)  . Arthritis     "all over" (07/26/2012)  . DDD (degenerative disc disease), lumbosacral   . Depression   . Anxiety    Past Surgical History  Procedure Laterality Date  . Appendectomy    . Cholecystectomy    . Carpal tunnel release Bilateral     . Coronary angioplasty with stent placement      "1; total of 1" (07/26/2012)  . Total hip arthroplasty Right   . Hernia repair      "stomach" (07/26/2012)  . Abdominal hysterectomy    . Fracture surgery    . Foot fracture surgery Left   . Breast surgery Bilateral     benign tumors  . Tumor excision      "stomach and LLE" (07/26/2012)  . Back surgery    . Fixation kyphoplasty lumbar spine     Family History  Problem Relation Age of Onset  . Hypertension Mother   . Breast cancer Mother   . Rheum arthritis Maternal Grandmother   . Cancer Sister    History  Substance Use Topics  . Smoking status: Former Smoker -- 1.00 packs/day for 55 years    Types: Cigarettes    Quit date: 08/18/2012  . Smokeless tobacco: Never Used  . Alcohol Use: No   OB History   Grav Para Term Preterm Abortions TAB SAB Ect Mult Living                 Review of Systems  Constitutional: Positive for fatigue. Negative for fever, chills and diaphoresis.  HENT: Negative for ear pain, congestion, sore throat, facial swelling, mouth sores, trouble swallowing, neck pain and neck stiffness.  Eyes: Negative.   Respiratory: Positive for shortness of breath. Negative for apnea, cough, chest tightness and wheezing.   Cardiovascular: Positive for leg swelling. Negative for chest pain and palpitations.  Gastrointestinal: Negative for nausea, vomiting, abdominal pain, diarrhea and abdominal distention.  Genitourinary: Negative for hematuria, flank pain, vaginal discharge, difficulty urinating and menstrual problem.  Musculoskeletal: Positive for back pain. Negative for gait problem.  Skin: Negative for rash and wound.  Neurological: Negative for dizziness, tremors, seizures, syncope, facial asymmetry, numbness and headaches.  Psychiatric/Behavioral: Negative.   All other systems reviewed and are negative.    Allergies  Aspirin; Levofloxacin; Other; and Penicillins  Home Medications   Current Outpatient Rx   Name  Route  Sig  Dispense  Refill  . albuterol (PROVENTIL) (2.5 MG/3ML) 0.083% nebulizer solution   Nebulization   Take 2.5 mg by nebulization every 6 (six) hours as needed for wheezing.         Marland Kitchen ALPRAZolam (XANAX) 0.5 MG tablet   Oral   Take 1 tablet (0.5 mg total) by mouth 4 (four) times daily as needed for anxiety.   120 tablet   0     Do Not Fill Till 12/09/12   . Alum & Mag Hydroxide-Simeth (MAGIC MOUTHWASH) SOLN   Oral   Take 10 mL by mouth 4 (four) times daily.   300 mL   0   . citalopram (CELEXA) 20 MG tablet   Oral   Take 1 tablet (20 mg total) by mouth daily.   30 tablet   0   . fenofibrate 160 MG tablet   Oral   Take 1 tablet (160 mg total) by mouth daily.   90 tablet   0   . Fluticasone-Salmeterol (ADVAIR) 250-50 MCG/DOSE AEPB   Inhalation   Inhale 1 puff into the lungs every 12 (twelve) hours.         . furosemide (LASIX) 40 MG tablet   Oral   Take 1 tablet (40 mg total) by mouth daily.   90 tablet   2   . gabapentin (NEURONTIN) 300 MG capsule   Oral   Take 300-600 mg by mouth 4 (four) times daily. Takes 1 capsule in morning, 1 capsule at noon, 1capsule in evening and 2 capsules at bedtime, every day.         . ipratropium-albuterol (DUONEB) 0.5-2.5 (3) MG/3ML SOLN   Nebulization   Take 3 mLs by nebulization 3 (three) times daily. For shortness of breath or wheezing   360 mL   3   . isosorbide mononitrate (IMDUR) 30 MG 24 hr tablet   Oral   Take 1 tablet (30 mg total) by mouth daily.   90 tablet   0   . metoprolol tartrate (LOPRESSOR) 25 MG tablet   Oral   Take 25 mg by mouth daily.         . montelukast (SINGULAIR) 10 MG tablet   Oral   Take 10 mg by mouth daily.         . nitroGLYCERIN (NITROSTAT) 0.4 MG SL tablet   Sublingual   Place 0.4 mg under the tongue every 5 (five) minutes as needed. For chest pain         . nystatin (MYCOSTATIN) 100000 UNIT/ML suspension   Oral   Take 5 mL (500,000 Units total) by mouth 4  (four) times daily. Use 48 hours after symptoms resolved   120 mL   0   . omeprazole (PRILOSEC) 20 MG capsule  Oral   Take 20 mg by mouth daily.         . ondansetron (ZOFRAN) 4 MG tablet   Oral   Take 4 mg by mouth every 6 (six) hours as needed for nausea.         Marland Kitchen oxyCODONE-acetaminophen (PERCOCET) 10-325 MG per tablet   Oral   Take 1 tablet by mouth every 8 (eight) hours as needed for pain.   90 tablet   0   . Rivaroxaban (XARELTO) 20 MG TABS tablet   Oral   Take 20 mg by mouth daily.         Marland Kitchen sulfamethoxazole-trimethoprim (BACTRIM DS) 800-160 MG per tablet   Oral   Take 1 tablet by mouth 2 (two) times daily. Started 11/23/12, for 10days, ended 12/02/12.          BP 108/54  Pulse 66  Temp(Src) 98.4 F (36.9 C) (Oral)  Resp 20  Ht 5\' 3"  (1.6 m)  Wt 133 lb (60.328 kg)  BMI 23.57 kg/m2  SpO2 99%  LMP 07/08/1972 Physical Exam  Nursing note and vitals reviewed. Constitutional: She is oriented to person, place, and time. She appears well-developed and well-nourished. No distress.  HENT:  Head: Normocephalic and atraumatic.  Right Ear: External ear normal.  Left Ear: External ear normal.  Nose: Nose normal.  Mouth/Throat: Oropharynx is clear and moist. No oropharyngeal exudate.  Eyes: Conjunctivae and EOM are normal. Pupils are equal, round, and reactive to light. Right eye exhibits no discharge. Left eye exhibits no discharge.  Neck: Normal range of motion. Neck supple. No JVD present. No tracheal deviation present. No thyromegaly present.  Cardiovascular: Normal rate, regular rhythm, normal heart sounds and intact distal pulses.  Exam reveals no gallop and no friction rub.   No murmur heard. Pulmonary/Chest: Tachypnea noted. No respiratory distress. She has no wheezes. She has rales in the right lower field and the left lower field. She exhibits no tenderness.  Abdominal: Soft. Bowel sounds are normal. She exhibits no distension. There is no tenderness. There  is no rebound and no guarding.    Musculoskeletal: Normal range of motion. She exhibits edema (2+ pitting to the knees bilaterally).  Lymphadenopathy:    She has no cervical adenopathy.  Neurological: She is alert and oriented to person, place, and time. No cranial nerve deficit. Coordination normal.  Skin: Skin is warm. No rash noted. She is not diaphoretic.  Psychiatric: She has a normal mood and affect. Her behavior is normal. Judgment and thought content normal.    ED Course   Procedures (including critical care time)  Labs Reviewed  CBC WITH DIFFERENTIAL - Abnormal; Notable for the following:    WBC 10.9 (*)    RBC 3.51 (*)    Hemoglobin 10.6 (*)    HCT 32.2 (*)    All other components within normal limits  BASIC METABOLIC PANEL - Abnormal; Notable for the following:    Chloride 92 (*)    CO2 38 (*)    GFR calc non Af Amer 84 (*)    All other components within normal limits  PRO B NATRIURETIC PEPTIDE - Abnormal; Notable for the following:    Pro B Natriuretic peptide (BNP) 1042.0 (*)    All other components within normal limits  POCT I-STAT TROPONIN I   Dg Chest 2 View  12/08/2012   *RADIOLOGY REPORT*  Clinical Data: Shortness of breath.  COPD.  CHEST - 2 VIEW  Comparison: CT chest and plain  film chest 08/31/2012.  Findings: Heart size is upper normal.  Lungs are clear.  No pneumothorax or pleural effusion.  Postoperative change of vertebral augmentation is identified.  No acute fracture is seen.  IMPRESSION: No acute disease.   Original Report Authenticated By: Holley Dexter, M.D.   1. Acute on chronic combined systolic and diastolic CHF (congestive heart failure)   2. Back pain     MDM  67 yr old F pt w/ PMH of systolic CHF here with worsening swelling in the feet and SOB. Patient with chronic back pain at baseline with no loss of function or sensation noted. Patient says she feels orthopneic, has shortness of breath with exertion and leg swelling. Patient with rales  on exam. She has 3 L of oxygen [place] at baseline and is sating 100% with that here. Doubt ACS but will obtain troponins. No CP. No recent N/V/D, abdominal pain.  Patient with elevated BNP and still symptomatically dyspneic after lasix. Will admit to hospital   Date: 12/08/2012  Rate: 68  Rhythm: normal sinus rhythm  QRS Axis: normal  Intervals: normal  ST/T Wave abnormalities: normal  Conduction Disutrbances:none and nonspecific intraventricular conduction delay  Narrative Interpretation:   Old EKG Reviewed: unchanged  Case discussed with Dr. Murlean Caller, MD 12/08/12 2055

## 2012-12-08 NOTE — ED Notes (Addendum)
Patient states she has become increasing sob today and family thought patient was having increased edema in legs and around neck and face. Patient on home O2 at 3lpm, patient sats at arrival 99%,

## 2012-12-08 NOTE — Telephone Encounter (Signed)
[facility] [address]

## 2012-12-08 NOTE — H&P (Signed)
[facility] History and Physical  Aimee Sanders ZOX:[id number] DOB: 27-Feb-1946 DOA: 12/08/2012  Referring physician: ER physician. PCP: Rudi Heap, MD   Chief Complaint: Shortness of breath and lower extremity edema.  HPI: Aimee Sanders is a 67 y.o. female with history of CHF(systolic and diastolic), COPD on home oxygen, history of DVT on xarelto presents with complaints of increasing weight gain and lower extremity edema with difficulty breathing. Patient has been having these symptoms for last 2-3 days. In the ER patient had chest x-ray which showed nothing acute but given her clinical presentation patient was given Lasix for possible CHF exacerbation and has been in bed for the same. Patient denies any chest pain productive cough fever chills nausea vomiting abdominal pain diarrhea. Patient has a left lower quadrant skin lesion for which patient is to follow with a dermatologist next week.  Review of Systems: As presented in the history of presenting illness, rest negative.  Past Medical History  Diagnosis Date  . Chronic combined systolic and diastolic CHF (congestive heart failure)     11/2011 - Echo: EF 40-45% w/ Gr 1 DD.  Marland Kitchen Deep vein thrombosis     Chronic Xarelto.  . Hyperlipidemia   . Sleep apnea   . 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 Coronary atherosclerosis of native coronary artery     a. reportedly s/p MI x 2;  b. March 2014 Cardiolite: EF 68% with scar affecting the inferolateral and anterolateral wall; mild ischemia in the mid-septum-> Med Rx.  . Chronic pain     Due to prior hip fracture and DDD back  . Peripheral vascular disease   . Pneumonia     "now and several times before" (07/26/2012)  . H/O hiatal hernia   . Stroke 2002; 2004    Denies residual; "both really light strokes" (07/26/2012)  . Arthritis     "all over" (07/26/2012)  . DDD (degenerative disc disease), lumbosacral   .  Depression   . Anxiety    Past Surgical History  Procedure Laterality Date  . Appendectomy    . Cholecystectomy    . Carpal tunnel release Bilateral   . Coronary angioplasty with stent placement      "1; total of 1" (07/26/2012)  . Total hip arthroplasty Right   . Hernia repair      "stomach" (07/26/2012)  . Abdominal hysterectomy    . Fracture surgery    . Foot fracture surgery Left   . Breast surgery Bilateral     benign tumors  . Tumor excision      "stomach and LLE" (07/26/2012)  . Back surgery    . Fixation kyphoplasty lumbar spine     Social History:  reports that she quit smoking about 3 months ago. Her smoking use included Cigarettes. She has a 55 pack-year smoking history. She has never used smokeless tobacco. She reports that she does not drink alcohol or use illicit drugs. Home. where does patient live-- Can do ADLs. Can patient participate in ADLs?  Allergies  Allergen Reactions  . Aspirin Other (See Comments)    Patient states no reaction, only told not to take while on other blood thinner  . Levofloxacin Hives  . Other Itching and Rash  . Penicillins Hives and Itching    Family History  Problem Relation Age of Onset  . Hypertension Mother   . Breast cancer Mother   .  Rheum arthritis Maternal Grandmother   . Cancer Sister       Prior to Admission medications   Medication Sig Start Date End Date Taking? Authorizing Provider  albuterol (PROVENTIL) (2.5 MG/3ML) 0.083% nebulizer solution Take 2.5 mg by nebulization every 6 (six) hours as needed for wheezing.   Yes Historical Provider, MD  ALPRAZolam Prudy Feeler) 0.5 MG tablet Take 1 tablet (0.5 mg total) by mouth 4 (four) times daily as needed for anxiety. 12/07/12  Yes Mary-Margaret Daphine Deutscher, FNP  Alum & Mag Hydroxide-Simeth (MAGIC MOUTHWASH) SOLN Take 10 mL by mouth 4 (four) times daily. 12/02/12  Yes Mary-Margaret Daphine Deutscher, FNP  citalopram (CELEXA) 20 MG tablet Take 1 tablet (20 mg total) by mouth daily. 10/24/12  Yes  Mary-Margaret Daphine Deutscher, FNP  fenofibrate 160 MG tablet Take 1 tablet (160 mg total) by mouth daily. 12/01/12  Yes Mae Shelda Altes, FNP  Fluticasone-Salmeterol (ADVAIR) 250-50 MCG/DOSE AEPB Inhale 1 puff into the lungs every 12 (twelve) hours. 07/20/12  Yes Mary-Margaret Daphine Deutscher, FNP  furosemide (LASIX) 40 MG tablet Take 1 tablet (40 mg total) by mouth daily. 09/06/12  Yes Richarda Overlie, MD  gabapentin (NEURONTIN) 300 MG capsule Take 300-600 mg by mouth 4 (four) times daily. Takes 1 capsule in morning, 1 capsule at noon, 1capsule in evening and 2 capsules at bedtime, every day.   Yes Historical Provider, MD  ipratropium-albuterol (DUONEB) 0.5-2.5 (3) MG/3ML SOLN Take 3 mLs by nebulization 3 (three) times daily. For shortness of breath or wheezing 07/27/12  Yes Mary-Margaret Daphine Deutscher, FNP  isosorbide mononitrate (IMDUR) 30 MG 24 hr tablet Take 1 tablet (30 mg total) by mouth daily. 12/01/12  Yes Mae Shelda Altes, FNP  metoprolol tartrate (LOPRESSOR) 25 MG tablet Take 25 mg by mouth daily.   Yes Historical Provider, MD  montelukast (SINGULAIR) 10 MG tablet Take 10 mg by mouth daily. 07/06/12  Yes Mary-Margaret Daphine Deutscher, FNP  nitroGLYCERIN (NITROSTAT) 0.4 MG SL tablet Place 0.4 mg under the tongue every 5 (five) minutes as needed. For chest pain   Yes Historical Provider, MD  nystatin (MYCOSTATIN) 100000 UNIT/ML suspension Take 5 mL (500,000 Units total) by mouth 4 (four) times daily. Use 48 hours after symptoms resolved 12/05/12  Yes Mae Shelda Altes, FNP  omeprazole (PRILOSEC) 20 MG capsule Take 20 mg by mouth daily.   Yes Historical Provider, MD  ondansetron (ZOFRAN) 4 MG tablet Take 4 mg by mouth every 6 (six) hours as needed for nausea. 06/04/12  Yes Nicoletta Dress. Colon Branch, MD  oxyCODONE-acetaminophen (PERCOCET) 10-325 MG per tablet Take 1 tablet by mouth every 8 (eight) hours as needed for pain. 12/05/12  Yes Mae Shelda Altes, FNP  Rivaroxaban (XARELTO) 20 MG TABS tablet Take 20 mg by mouth daily.   Yes Historical Provider,  MD  sulfamethoxazole-trimethoprim (BACTRIM DS) 800-160 MG per tablet Take 1 tablet by mouth 2 (two) times daily. Started 11/23/12, for 10days, ended 12/02/12.    Historical Provider, MD   Physical Exam: Filed Vitals:   12/08/12 1842 12/08/12 1847 12/08/12 2100 12/08/12 2115  BP: 108/54  133/60 135/65  Pulse: 66  63 73  Temp: 98.4 F (36.9 C)     TempSrc: Oral     Resp: 20  19 14   Height:  5\' 3"  (1.6 m)    Weight:  60.328 kg (133 lb)    SpO2: 99%  100% 99%     General:  Well-developed and nourished.  Eyes: Anicteric no pallor.  ENT: No discharge from the ears eyes nose mouth.  Neck: No mass felt.  Cardiovascular: S1-S2 heard.  Respiratory: No rhonchi or crepitations.  Abdomen: Soft nontender bowel sounds present.  Skin: Skin lesion in the left lower quadrant of the abdomen.  Musculoskeletal: Bilateral lower extremity edema with skin lesion in the right lower extremity.  Psychiatric: Appears normal.  Neurologic: Alert and oriented to time place and person. Moves all extremities.  Labs on Admission:  Basic Metabolic Panel:  Recent Labs Lab 12/08/12 1907  NA 137  K 3.6  CL 92*  CO2 38*  GLUCOSE 91  BUN 22  CREATININE 0.77  CALCIUM 9.6   Liver Function Tests: No results found for this basename: AST, ALT, ALKPHOS, BILITOT, PROT, ALBUMIN,  in the last 168 hours No results found for this basename: LIPASE, AMYLASE,  in the last 168 hours No results found for this basename: AMMONIA,  in the last 168 hours CBC:  Recent Labs Lab 12/08/12 1907  WBC 10.9*  NEUTROABS 7.1  HGB 10.6*  HCT 32.2*  MCV 91.7  PLT 353   Cardiac Enzymes: No results found for this basename: CKTOTAL, CKMB, CKMBINDEX, TROPONINI,  in the last 168 hours  BNP (last 3 results)  Recent Labs  07/26/12 1616 08/31/12 1303 12/08/12 1907  PROBNP 1873.0* 2598.0* 1042.0*   CBG: No results found for this basename: GLUCAP,  in the last 168 hours  Radiological Exams on Admission: Dg Chest 2  View  12/08/2012   *RADIOLOGY REPORT*  Clinical Data: Shortness of breath.  COPD.  CHEST - 2 VIEW  Comparison: CT chest and plain film chest 08/31/2012.  Findings: Heart size is upper normal.  Lungs are clear.  No pneumothorax or pleural effusion.  Postoperative change of vertebral augmentation is identified.  No acute fracture is seen.  IMPRESSION: No acute disease.   Original Report Authenticated By: Holley Dexter, M.D.    EKG: Independently reviewed. Normal sinus rhythm.  Assessment/Plan Principal Problem:   CHF (congestive heart failure) Active Problems:   DEEP VEIN THROMBOSIS/PHLEBITIS   COPD UNSPECIFIED   1. Decompensated CHF last EF measured May 2014 was 45% - patient is placed on Lasix 40 IV every 8. May consider adding ARB/ACE inhibitor is a blood pressure permits. Closely follow intake output and metabolic panel. 2. COPD presently not wheezing - continue nebulizers and inhalers. 3. History of DVT on xarelto. 4. Skin lesion of left lower quadrant - follow with a dermatologist.    Code Status: Full code.  Family Communication: Patient's family at the bedside.  Disposition Plan: Admit to inpatient.    Nejla Reasor N. [facility] Pager [phone].  If 7PM-7AM, please contact night-coverage [email or web address] Password [facility] 12/08/2012, 9:32 PM

## 2012-12-08 NOTE — ED Provider Notes (Signed)
I saw and evaluated the patient, reviewed the resident's note and I agree with the findings and plan.  67 year old female with worsening shortness of breath and lower chimney swelling over the past several days. Rales throughout all lung fields. She is on 3 L of oxygen at home and satting well or not this time. Patient admitted by medicine.  Dagmar Hait, MD 12/08/12 [phone]

## 2012-12-08 NOTE — Telephone Encounter (Signed)
Daughter states that she was unable to understand Rolanda on the phone earlier today so she came over to her house.   She can barely speak.  Both legs are extremely swollen, around her lips are blue, and she has a large blue knot on the side of her neck.  Corin reported that the knot had been there for 3 days.  Asked her daughter to call 911 and have Kaitlin transported to the ED.  She stated understanding and agreement to plan.  They will f/u with [place] PRN.

## 2012-12-08 NOTE — ED Notes (Signed)
Attempted to give report x1 - RN unavailable. 

## 2012-12-09 LAB — COMPREHENSIVE METABOLIC PANEL
Albumin: 2.9 g/dL — ABNORMAL LOW (ref 3.5–5.2)
BUN: 24 mg/dL — ABNORMAL HIGH (ref 6–23)
Chloride: 91 mEq/L — ABNORMAL LOW (ref 96–112)
Creatinine, Ser: 0.85 mg/dL (ref 0.50–1.10)
Total Bilirubin: 0.2 mg/dL — ABNORMAL LOW (ref 0.3–1.2)
Total Protein: 5.9 g/dL — ABNORMAL LOW (ref 6.0–8.3)

## 2012-12-09 LAB — CBC WITH DIFFERENTIAL/PLATELET
Eosinophils Relative: 0 % (ref 0–5)
Lymphocytes Relative: 36 % (ref 12–46)
Lymphs Abs: 4.2 10*3/uL — ABNORMAL HIGH (ref 0.7–4.0)
MCV: 92.2 fL (ref 78.0–100.0)
Neutro Abs: 6.2 10*3/uL (ref 1.7–7.7)
Platelets: 352 10*3/uL (ref 150–400)
RBC: 3.59 MIL/uL — ABNORMAL LOW (ref 3.87–5.11)
WBC: 11.5 10*3/uL — ABNORMAL HIGH (ref 4.0–10.5)

## 2012-12-09 LAB — MRSA PCR SCREENING: MRSA by PCR: NEGATIVE

## 2012-12-09 MED ORDER — OXYCODONE HCL 5 MG PO TABS
5.0000 mg | ORAL_TABLET | Freq: Three times a day (TID) | ORAL | Status: DC | PRN
  Administered 2012-12-09 – 2012-12-16 (×12): 5 mg via ORAL
  Filled 2012-12-09 (×14): qty 1

## 2012-12-09 MED ORDER — OXYCODONE-ACETAMINOPHEN 5-325 MG PO TABS
1.0000 | ORAL_TABLET | Freq: Four times a day (QID) | ORAL | Status: DC | PRN
  Administered 2012-12-09 – 2012-12-16 (×12): 1 via ORAL
  Filled 2012-12-09 (×14): qty 1

## 2012-12-09 MED ORDER — ISOSORBIDE MONONITRATE 15 MG HALF TABLET
15.0000 mg | ORAL_TABLET | Freq: Every day | ORAL | Status: DC
  Administered 2012-12-10 – 2012-12-17 (×8): 15 mg via ORAL
  Filled 2012-12-09 (×8): qty 1

## 2012-12-09 MED ORDER — SODIUM CHLORIDE 0.9 % IV BOLUS (SEPSIS)
250.0000 mL | Freq: Once | INTRAVENOUS | Status: AC
  Administered 2012-12-09: 250 mL via INTRAVENOUS

## 2012-12-09 NOTE — Progress Notes (Signed)
admitted pt from ed. AAOx3 . Assisted to bed. Oriented to call bell and room. VS taken and recorded. 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 Pt put on telemetry. assessment done orders carried out. o2 3lnc in use. No c/o SOB. Son at bedside , continued to monitor patient.

## 2012-12-09 NOTE — Progress Notes (Signed)
Utilization Review Completed.   Deondre Marinaro, RN, BSN Nurse Case Manager  [phone]  

## 2012-12-09 NOTE — Progress Notes (Signed)
Aimee Sanders ZOX:[id number] DOB: [birth year] DOA: 12/08/2012 PCP: Rudi Heap, MD  Brief narrative: 67 yr old former smoker till 5/14, known Isch CM and CHF on chronic lasix admitted with multifactorial dyspnea  Past medical history-As per Problem list Chart reviewed as below- Admission 08/31/12 COPD exacerbation + AECHF Admission 07/26/12 c Strep PNA c AECOPD Admission 06/22/12 C Unstable angina-stress showed possible septal ischmeia, stable copd, CHF Admission 03/01/12 AECOPD, compensated CHF Admission Acute Resp failure-thoguth 2/2 to COPD Admission 01/12/12 Acute resp failure, likely multifactorial Admission 11/23/11 AECOPD Admission 5/25/13AECOPD-needed BIPAPa and Rx by CCM Admission 07/08/11 A/Chronic resp failure  Consultants:  none  Procedures:  non  Antibiotics:  none   Subjective  Feels much better.  Nursing states has passed a lot of urine .  Denies CP.  States that she had swelling prior to coming in and was on lasic x.  She tells me shew as on 40 mog bid but i see it written for once daily. SHe thinks Iv lasix helepd her more-so than the nebulizations   Objective    Interim History: nad   Telemetry: NSR c PVC's  Objective: Filed Vitals:   12/08/12 2115 12/08/12 2149 12/08/12 2206 12/09/12 0441  BP: 135/65 120/72 112/45 107/47  Pulse: 73 72 67 80  Temp:   98.2 F (36.8 C) 98.4 F (36.9 C)  TempSrc:   Oral Oral  Resp: 14 20 18 18   Height:   5\' 3"  (1.6 m)   Weight:   64.638 kg (142 lb 8 oz) 64 kg (141 lb 1.5 oz)  SpO2: 99% 98% 100% 98%    Intake/Output Summary (Last 24 hours) at 12/09/12 8119 Last data filed at 12/09/12 0700  Gross per 24 hour  Intake    300 ml  Output      0 ml  Net    300 ml    Exam:  General: eomi, NCAT Cardiovascular: s1 s2 no m/r/g Respiratory: clear Abdomen: soft, NT, ND-has a swelling with irreg margins that is oozing on abdomen Skin 2 +Le edema Neurointact  Data Reviewed: Basic Metabolic Panel:  Recent  Labs Lab 12/08/12 1907 12/09/12 0500  NA 137 138  K 3.6 3.6  CL 92* 91*  CO2 38* 39*  GLUCOSE 91 79  BUN 22 24*  CREATININE 0.77 0.85  CALCIUM 9.6 9.4  MG  --  2.3   Liver Function Tests:  Recent Labs Lab 12/09/12 0500  AST 19  ALT 20  ALKPHOS 62  BILITOT 0.2*  PROT 5.9*  ALBUMIN 2.9*   No results found for this basename: LIPASE, AMYLASE,  in the last 168 hours No results found for this basename: AMMONIA,  in the last 168 hours CBC:  Recent Labs Lab 12/08/12 1907 12/09/12 0500  WBC 10.9* 11.5*  NEUTROABS 7.1 6.2  HGB 10.6* 10.7*  HCT 32.2* 33.1*  MCV 91.7 92.2  PLT 353 352   Cardiac Enzymes:  Recent Labs Lab 12/08/12 2240  TROPONINI <0.30   BNP: No components found with this basename: POCBNP,  CBG: No results found for this basename: GLUCAP,  in the last 168 hours  Recent Results (from the past 240 hour(s))  MRSA PCR SCREENING     Status: None   Collection Time    12/08/12 11:21 PM      Result Value Range Status   MRSA by PCR NEGATIVE  NEGATIVE Final   Comment:            The GeneXpert MRSA  Assay (FDA     approved for NASAL specimens     only), is one component of a     comprehensive MRSA colonization     surveillance program. It is not     intended to diagnose MRSA     infection nor to guide or     monitor treatment for     MRSA infections.     Studies:              All Imaging reviewed and is as per above notation   Scheduled Meds: . albuterol  2.5 mg Nebulization TID   And  . ipratropium  0.5 mg Nebulization TID  . citalopram  20 mg Oral Daily  . fenofibrate  160 mg Oral Q supper  . gabapentin  300 mg Oral TID AC   And  . gabapentin  600 mg Oral QHS  . [START ON 12/10/2012] isosorbide mononitrate  15 mg Oral Daily  . metoprolol tartrate  25 mg Oral Daily  . mometasone-formoterol  2 puff Inhalation BID  . montelukast  10 mg Oral Daily  . nystatin  500,000 Units Oral QID  . pantoprazole  40 mg Oral Daily  . Rivaroxaban  20 mg  Oral Q supper  . sodium chloride  250 mL Intravenous Once  . sodium chloride  3 mL Intravenous Q12H  . sodium chloride  3 mL Intravenous Q12H   Continuous Infusions:    Assessment/Plan: 1. Likely decompensated CHF> Acute Exacerbation COPD-continue diuresis with IV lasix-nursing informed me later in day patient hypotensive.  Decision made to hold lasix, 250 cc bolus and reassess, as already has put out -2.0 liters-copntinue Metoprolol 25 daily-given hypotension [whihc was lieklty 2/2 to lasix], cut back imdur to 15 daily from 8/23 2. COPD-stable, not acute- uses chronic home O2no need for coverage, Abx-continue albuterol 2.5 mg nebs tid, qhsmetasone-formoterol bid, Monteleukast 10 daily 3. H/o DVT-continue Xarelto 20 qpm 4. Depression-continue Citalopram 20 daily 5. Neuropathy-continue gabapentin 300 tid and 600 6. Hld-cont Fibrate 16o daily  Code Status: Full Family Communication: spoek with son at bedside  Disposition Plan: inpt   Pleas Koch, MD  [facility] Pager [phone] 12/09/2012, 7:27 AM    LOS: 1 day

## 2012-12-09 NOTE — Progress Notes (Signed)
Nutrition Brief Note  Patient identified on the Malnutrition Screening Tool (MST) Report. Pt reported that she is unsure if she has had weight loss; denies poor appetite. Per EPIC weights, her weights have been stable - current weight elevated 2/2 fluid status.  Wt Readings from Last 15 Encounters:  12/09/12 141 lb 1.5 oz (64 kg)  11/09/12 123 lb 8 oz (56.019 kg)  09/06/12 122 lb 11.2 oz (55.656 kg)  07/28/12 132 lb 15 oz (60.3 kg)  07/14/12 129 lb (58.514 kg)  06/28/12 125 lb 12.8 oz (57.063 kg)  06/04/12 130 lb (58.968 kg)  03/04/12 130 lb 14.4 oz (59.376 kg)  02/11/12 129 lb 8 oz (58.741 kg)  01/02/12 132 lb 9.6 oz (60.147 kg)  11/25/11 120 lb 11.2 oz (54.749 kg)  09/13/11 129 lb 10.1 oz (58.8 kg)  07/10/11 133 lb 1.6 oz (60.374 kg)  07/08/10 138 lb (62.596 kg)  05/13/10 135 lb (61.236 kg)    Body mass index is 25 kg/(m^2). Patient meets criteria for normal weight based on current BMI.   Current diet order is Heart Healthy. Labs and medications reviewed.   No nutrition interventions warranted at this time. If nutrition issues arise, please consult RD.   Jarold Motto MS, RD, LDN Pager: [phone] After-hours pager: [phone]

## 2012-12-09 NOTE — Clinical Documentation Improvement (Signed)
THIS DOCUMENT IS NOT A PERMANENT PART OF THE MEDICAL RECORD  Please update your documentation with the medical record to reflect your response to this query. If you need help knowing how to do this please call [phone]   12/09/12  Dr. Mahala Menghini,  In a better effort to capture your patient's severity of illness, reflect appropriate length of stay and utilization of resources, a review of the patient medical record has revealed the following information:   - Chronic Home use of 02 at 3 liters nasal cannula   - Home Medications include:        Albuterol Nebs        Advair Inhaler        Duonebs        Singular   - Chronic Respiratory Failure documented on Non-Hospital Problem List by Dr. Sherene Sires as of 05/13/10.   See progress notes please   Based on your clinical judgment, please document in the progress notes and discharge summary if a condition below provides greater specificity regarding the patient's respiratory status:   - Chronic 02 Dependent Respiratory Failure   - Other Condition   - Unable to Clinically Determine    In responding to this query please exercise your independent judgment.    The fact that a query is asked, does not imply that any particular answer is desired or expected.    Reviewed:  no additional documentation provided  Thank You,  Jerral Ralph  RN BSN CCDS Certified Clinical Documentation Specialist: [phone] [employer]

## 2012-12-09 NOTE — Progress Notes (Signed)
Patient evaluated for long-term disease management services with [facility] Care Management Program. Met with patient at bedside to explain [facility] Care Management services. Ms Giebel declined services. She states "my daughter is pretty on top of things". Left brochure at bedside for her to call [facility] Care Management in case she changes her mind in the future.     Raiford Noble, MSN-Ed, RN,BSN, [facility], [phone]

## 2012-12-10 MED ORDER — FUROSEMIDE 40 MG PO TABS
60.0000 mg | ORAL_TABLET | Freq: Two times a day (BID) | ORAL | Status: DC
  Administered 2012-12-10 – 2012-12-12 (×4): 60 mg via ORAL
  Filled 2012-12-10 (×7): qty 1

## 2012-12-10 MED ORDER — MORPHINE SULFATE 2 MG/ML IJ SOLN
2.0000 mg | INTRAMUSCULAR | Status: DC | PRN
  Administered 2012-12-10 – 2012-12-16 (×17): 2 mg via INTRAVENOUS
  Filled 2012-12-10 (×18): qty 1

## 2012-12-10 MED ORDER — METOPROLOL TARTRATE 12.5 MG HALF TABLET
12.5000 mg | ORAL_TABLET | Freq: Every day | ORAL | Status: DC
  Administered 2012-12-11 – 2012-12-17 (×7): 12.5 mg via ORAL
  Filled 2012-12-10 (×7): qty 1

## 2012-12-10 NOTE — Plan of Care (Signed)
Problem: Phase I Progression Outcomes Goal: EF % per last Echo/documented,Core Reminder form on chart Outcome: Completed/Met Date Met:  12/10/12 45% in 08/2012

## 2012-12-10 NOTE — Progress Notes (Signed)
The patient complained of 8-9/10 pain in her back twice last night.  She stated that she fell a month ago and that her back has not been the same since falling.  She is concerned that the fall messed up a previous back surgery.  She was given pain medication and heat packs with some relief and she was able to sleep a large portion of the night.

## 2012-12-10 NOTE — Progress Notes (Signed)
Aimee Sanders:[id number] DOB: [birth year] DOA: 12/08/2012 PCP: Rudi Heap, MD  Brief narrative: 68 yr old former smoker till 5/14, known Isch CM and CHF on chronic lasix admitted with multifactorial dyspnea.  This was thought to be 2/2 to AECHF>>AECOPD Lasix seemed to make improvement more so than Breathing Rx.  She was noted to be somewhat hypotensive and her chronic cardiac meds wwere adjusted to allow for diuresis  Past medical history-As per Problem list Chart reviewed as below- Admission 08/31/12 COPD exacerbation + AECHF Admission 07/26/12 c Strep PNA c AECOPD Admission 06/22/12 C Unstable angina-stress showed possible septal ischmeia, stable copd, CHF Admission 03/01/12 AECOPD, compensated CHF Admission Acute Resp failure-thoguth 2/2 to COPD Admission 01/12/12 Acute resp failure, likely multifactorial Admission 11/23/11 AECOPD Admission 5/25/13AECOPD-needed BIPAP and Rx by CCM Admission 07/08/11 A/Chronic resp failure  Consultants:  none  Procedures:  non  Antibiotics:  none   Subjective  Feels much better, but feels a little chilly today and is warm to touch.  T 98 at bedside. Breathing seems back to baseline.  denies n/v/cp dysuria   Objective    Interim History: nad   Telemetry: NSR c PVC's  Objective: Filed Vitals:   12/10/12 1357 12/10/12 1359 12/10/12 1401 12/10/12 1404  BP: 111/49 108/66 160/61 154/74  Pulse: 96 104 106 117  Temp: 98.5 F (36.9 C)     TempSrc: Oral     Resp: 19     Height:      Weight:      SpO2: 98%       Intake/Output Summary (Last 24 hours) at 12/10/12 1506 Last data filed at 12/10/12 1406  Gross per 24 hour  Intake   1196 ml  Output   1600 ml  Net   -404 ml    Exam:  General: eomi, NCAT Cardiovascular: s1 s2 no m/r/g Respiratory: clear Abdomen: soft, NT, ND-has a swelling with irreg margins that is oozing on abdomen Skin 2 +Le edema Neurointact  Data Reviewed: Basic Metabolic Panel:  Recent Labs Lab  12/08/12 1907 12/09/12 0500  NA 137 138  K 3.6 3.6  CL 92* 91*  CO2 38* 39*  GLUCOSE 91 79  BUN 22 24*  CREATININE 0.77 0.85  CALCIUM 9.6 9.4  MG  --  2.3   Liver Function Tests:  Recent Labs Lab 12/09/12 0500  AST 19  ALT 20  ALKPHOS 62  BILITOT 0.2*  PROT 5.9*  ALBUMIN 2.9*   No results found for this basename: LIPASE, AMYLASE,  in the last 168 hours No results found for this basename: AMMONIA,  in the last 168 hours CBC:  Recent Labs Lab 12/08/12 1907 12/09/12 0500  WBC 10.9* 11.5*  NEUTROABS 7.1 6.2  HGB 10.6* 10.7*  HCT 32.2* 33.1*  MCV 91.7 92.2  PLT 353 352   Cardiac Enzymes:  Recent Labs Lab 12/08/12 2240  TROPONINI <0.30   BNP: No components found with this basename: POCBNP,  CBG: No results found for this basename: GLUCAP,  in the last 168 hours  Recent Results (from the past 240 hour(s))  MRSA PCR SCREENING     Status: None   Collection Time    12/08/12 11:21 PM      Result Value Range Status   MRSA by PCR NEGATIVE  NEGATIVE Final   Comment:            The GeneXpert MRSA Assay (FDA     approved for NASAL specimens  only), is one component of a     comprehensive MRSA colonization     surveillance program. It is not     intended to diagnose MRSA     infection nor to guide or     monitor treatment for     MRSA infections.     Studies:              All Imaging reviewed and is as per above notation   Scheduled Meds: . albuterol  2.5 mg Nebulization TID   And  . ipratropium  0.5 mg Nebulization TID  . citalopram  20 mg Oral Daily  . fenofibrate  160 mg Oral Q supper  . furosemide  60 mg Oral BID  . gabapentin  300 mg Oral TID AC   And  . gabapentin  600 mg Oral QHS  . isosorbide mononitrate  15 mg Oral Daily  . [START ON 12/11/2012] metoprolol tartrate  12.5 mg Oral Daily  . mometasone-formoterol  2 puff Inhalation BID  . montelukast  10 mg Oral Daily  . nystatin  500,000 Units Oral QID  . pantoprazole  40 mg Oral Daily    . Rivaroxaban  20 mg Oral Q supper  . sodium chloride  3 mL Intravenous Q12H  . sodium chloride  3 mL Intravenous Q12H   Continuous Infusions:    Assessment/Plan: 1. Likely decompensated CHF> Acute Exacerbation COPD-continue diuresis with PO lasix 60 bid- Metoprolol 25 cut back to 12.5 daily-given hypotension [which was likely 2/2 to lasix], cut back imdur to 15 daily from 8/23 2. COPD-stable, not acute- uses chronic home O2 no need for coverage, Abx-continue albuterol 2.5 mg nebs tid, qhs metasone-formoterol bid, Monteleukast 10 daily 3. H/o DVT-continue Xarelto 20 qpm 4. Depression-continue Citalopram 20 daily 5. Neuropathy-continue gabapentin 300 tid and 600 6. Hld-cont Fibrate 160 daily  Code Status: Full Family Communication: spoek with son at bedside  Disposition Plan: inpt   Pleas Koch, MD  [facility] Pager [phone] 12/10/2012, 3:06 PM    LOS: 2 days

## 2012-12-10 NOTE — Progress Notes (Signed)
Patient complaining of severe pain in her back and her knees. She has received oxy IR and percocet as ordered.  Aimee Sanders on call made aware.  New orders received for morphine.  Given to patient as ordered.  Will continue to monitor patient.

## 2012-12-10 NOTE — Progress Notes (Signed)
The patient is noncompliant with her fluid restrictions.  Her son brought her soft drinks after educating her on the fluid restriction and it's importance.  Accurate Is and Os could not be taken.

## 2012-12-11 LAB — CBC
MCHC: 31.9 g/dL (ref 30.0–36.0)
Platelets: 272 10*3/uL (ref 150–400)
RDW: 14.7 % (ref 11.5–15.5)
WBC: 7.1 10*3/uL (ref 4.0–10.5)

## 2012-12-11 MED ORDER — CYCLOBENZAPRINE HCL 10 MG PO TABS
5.0000 mg | ORAL_TABLET | Freq: Three times a day (TID) | ORAL | Status: DC | PRN
  Administered 2012-12-12 – 2012-12-16 (×7): 5 mg via ORAL
  Filled 2012-12-11 (×4): qty 1
  Filled 2012-12-11: qty 0.5
  Filled 2012-12-11 (×2): qty 1

## 2012-12-11 NOTE — Progress Notes (Signed)
PT Cancellation Note  Patient Details Name: Aimee Sanders MRN: [id number] DOB: 1946/01/10   Cancelled Treatment:    Reason Eval/Treat Not Completed: Pain limiting ability to participate.  Pt requesting pain meds at this time and refusing PT.  Pt and caregiver also explained that pt fell ~1 month ago in the shower on her back.  Since fall pt has been unable to ambulate > 10' and when pt ambulates she's screaming in pain.  Pt and caregiver are requesting MRI of the back.  Pt may benefit from neuro or ortho consult to address back pain.      Dymond Gutt 12/11/2012, 3:59 PM   Jake Shark, PT DPT [phone]

## 2012-12-11 NOTE — Progress Notes (Signed)
Clinical Social Work Department BRIEF PSYCHOSOCIAL ASSESSMENT 12/11/2012  Patient:  Aimee Sanders, Aimee Sanders     Account Number:  [id number]     Admit date:  12/08/2012  Clinical Social Worker:  Hadley Pen  Date/Time:  12/11/2012 02:47 PM  Referred by:  Physician  Date Referred:  12/11/2012 Referred for  SNF Placement   Other Referral:   Interview type:  Family Other interview type:   Weekend CSW spoke with son Aimee Sanders [phone] who was at the bedside.    PSYCHOSOCIAL DATA Living Status:  FAMILY Admitted from facility:   Level of care:   Primary support name:  Marielle Mantione [phone] Primary support relationship to patient:  CHILD, ADULT Degree of support available:   Good    CURRENT CONCERNS Current Concerns  Post-Acute Placement   Other Concerns:    SOCIAL WORK ASSESSMENT / PLAN Weekend CSW spoke with patient's son Aimee Sanders who was at the bedside regarding patient's plans after d/c, as patient was sleeping. Patient's son stated that patient lived with him and that he would prefer that she return home with home health. CSW explained option of SNF rehab as well. Son stated that he would discuss options with his mother and siblings. Weekday CSW to follow for d/c planning.   Assessment/plan status:  Information/Referral to [facility] Other assessment/ plan:   Information/referral to community resources:   Weekend CSW provided patient/family with local SNF list.    PATIENT'S/FAMILY'S RESPONSE TO PLAN OF CARE: Patient was asleep and not alert during assessment. Son Aimee Sanders stated his wish for his mother to return home with home health. Son declined for CSW to fax out info to SNF's until he spoke with his mother and siblings.     Samuella Bruin, MSW, LCSWA Clinical Social Worker [facility] Emergency Dept. [phone]

## 2012-12-11 NOTE — Progress Notes (Addendum)
Aimee Sanders:[id number] DOB: 03-Jul-1945 DOA: 12/08/2012 PCP: Rudi Heap, MD  Brief narrative: 67 yr old former smoker till 5/14, known Isch CM and CHF on chronic lasix admitted with multifactorial dyspnea.  This was thought to be 2/2 to AECHF>>AECOPD Lasix seemed to make improvement more so than Breathing Rx.  She was noted to be somewhat hypotensive and her chronic cardiac meds were adjusted to allow for diuresis.  She started to complain of increasing LBP 8/24 and it was noted she had sustained a fall with acute fracture of vertebrae about a month prior at [facility].  PT/Ot were consutled and records were saught from prior facility to help with LBP  Past medical history-As per Problem list Chart reviewed as below- Admission 08/31/12 COPD exacerbation + AECHF Admission 07/26/12 c Strep PNA c AECOPD Admission 06/22/12 C Unstable angina-stress showed possible septal ischemia, stable copd, CHF Admission 03/01/12 AECOPD, compensated CHF Admission Acute Resp failure-thoguth 2/2 to COPD Admission 01/12/12 Acute resp failure, likely multifactorial Admission 11/23/11 AECOPD Admission 5/25/13AECOPD-needed BIPAP and Rx by CCM Admission 07/08/11 A/Chronic resp failure  Consultants:  none  Procedures:  non  Antibiotics:  none   Subjective  Severe LBP overnight, only minimally relieved by oral meds and needed to be placed on Morphine IV Still c/o severe pain in Low back with radiaiton down to ankles.  Able to however lift le's Able to only sit up with much diffculty and suport this am No n/v/cp/  SOB at baseline   Objective    Interim History: nad   Telemetry: NSR c PVC's  Objective: Filed Vitals:   12/11/12 0652 12/11/12 0656 12/11/12 0659 12/11/12 0701  BP: 112/47 129/72 139/79 167/72  Pulse: 82     Temp: 98.8 F (37.1 C)     TempSrc: Oral     Resp: 20     Height:      Weight: 65.635 kg (144 lb 11.2 oz)     SpO2: 99%       Intake/Output Summary (Last 24  hours) at 12/11/12 0848 Last data filed at 12/11/12 8119  Gross per 24 hour  Intake   1183 ml  Output   3050 ml  Net  -1867 ml    Exam:  General: eomi, NCAT Cardiovascular: s1 s2 no m/r/g Respiratory: clear Abdomen: soft, NT, ND-has a swelling with irreg margins that is oozing on abdomen Skin 1 +Le edema Neurointact-SLR neg, Hip internal and external rotation neg.  Some spasm over Lower back but poor exam [patient not able to really sit up without signif pain]  Data Reviewed: Basic Metabolic Panel:  Recent Labs Lab 12/08/12 1907 12/09/12 0500  NA 137 138  K 3.6 3.6  CL 92* 91*  CO2 38* 39*  GLUCOSE 91 79  BUN 22 24*  CREATININE 0.77 0.85  CALCIUM 9.6 9.4  MG  --  2.3   Liver Function Tests:  Recent Labs Lab 12/09/12 0500  AST 19  ALT 20  ALKPHOS 62  BILITOT 0.2*  PROT 5.9*  ALBUMIN 2.9*   No results found for this basename: LIPASE, AMYLASE,  in the last 168 hours No results found for this basename: AMMONIA,  in the last 168 hours CBC:  Recent Labs Lab 12/08/12 1907 12/09/12 0500 12/11/12 0440  WBC 10.9* 11.5* 7.1  NEUTROABS 7.1 6.2  --   HGB 10.6* 10.7* 9.6*  HCT 32.2* 33.1* 30.1*  MCV 91.7 92.2 93.5  PLT 353 352 272   Cardiac Enzymes:  Recent Labs Lab 12/08/12 2240  TROPONINI <0.30   BNP: No components found with this basename: POCBNP,  CBG: No results found for this basename: GLUCAP,  in the last 168 hours  Recent Results (from the past 240 hour(s))  MRSA PCR SCREENING     Status: None   Collection Time    12/08/12 11:21 PM      Result Value Range Status   MRSA by PCR NEGATIVE  NEGATIVE Final   Comment:            The GeneXpert MRSA Assay (FDA     approved for NASAL specimens     only), is one component of a     comprehensive MRSA colonization     surveillance program. It is not     intended to diagnose MRSA     infection nor to guide or     monitor treatment for     MRSA infections.     Studies:              All Imaging  reviewed and is as per above notation   Scheduled Meds: . albuterol  2.5 mg Nebulization TID   And  . ipratropium  0.5 mg Nebulization TID  . citalopram  20 mg Oral Daily  . fenofibrate  160 mg Oral Q supper  . furosemide  60 mg Oral BID  . gabapentin  300 mg Oral TID AC   And  . gabapentin  600 mg Oral QHS  . isosorbide mononitrate  15 mg Oral Daily  . metoprolol tartrate  12.5 mg Oral Daily  . mometasone-formoterol  2 puff Inhalation BID  . montelukast  10 mg Oral Daily  . nystatin  500,000 Units Oral QID  . pantoprazole  40 mg Oral Daily  . Rivaroxaban  20 mg Oral Q supper  . sodium chloride  3 mL Intravenous Q12H  . sodium chloride  3 mL Intravenous Q12H   Continuous Infusions:    Assessment/Plan: 1. Likely decompensated CHF> Acute Exacerbation COPD-continue diuresis with PO lasix 60 bid- Metoprolol 25 cut back to 12.5 daily-given hypotension [which was likely 2/2 to lasix], cut back imdur to 15 daily from 8/23.  Her weights have been anywhere form 58-64 kg over past 6 mo.  She was -1.8 liters overnight, is -5 liters overall for hospital visit Will re-weigh in am and monitor. 2. Severe LBP.  No new trauma.  Has been pretty much bed-bound . Has h/o prior fracture.  Trial Fexeril for spasm.  Will obtain records from OSH to see what was done and potentially discuss with NS/IR if there eis anything amenable to Rx, although might be unlikely.  Eval from PT sought as may need skilled care 3. Chronic O2 dependant COPD-stable, not acute-uses chronic home O2 no need for coverage with Abx-continue albuterol 2.5 mg nebs tid, qhs metasone-formoterol bid, Monteleukast 10 daily 4. H/o DVT-continue Xarelto 20 qpm 5. Depression-continue Citalopram 20 daily 6. Neuropathy-continue gabapentin 300 tid and 600 7. Hld-cont Fibrate 160 daily  Code Status: Full Family Communication: spoek with son at bedside  Disposition Plan: inpt   Pleas Koch, MD  [facility] Pager [phone] 12/11/2012,  8:48 AM    LOS: 3 days

## 2012-12-12 ENCOUNTER — Inpatient Hospital Stay (HOSPITAL_COMMUNITY): Payer: PRIVATE HEALTH INSURANCE

## 2012-12-12 LAB — BASIC METABOLIC PANEL
Chloride: 89 mEq/L — ABNORMAL LOW (ref 96–112)
GFR calc Af Amer: >90 mL/min (ref >90)
GFR calc non Af Amer: 85 mL/min — ABNORMAL LOW (ref >90)
Potassium: 2.8 mEq/L — ABNORMAL LOW (ref 3.5–5.1)
Sodium: 137 mEq/L (ref 135–145)

## 2012-12-12 MED ORDER — FUROSEMIDE 40 MG PO TABS
40.0000 mg | ORAL_TABLET | Freq: Two times a day (BID) | ORAL | Status: DC
  Administered 2012-12-12 – 2012-12-13 (×2): 40 mg via ORAL
  Filled 2012-12-12 (×4): qty 1

## 2012-12-12 MED ORDER — POTASSIUM CHLORIDE CRYS ER 20 MEQ PO TBCR
40.0000 meq | EXTENDED_RELEASE_TABLET | Freq: Two times a day (BID) | ORAL | Status: DC
  Administered 2012-12-12 – 2012-12-13 (×4): 40 meq via ORAL
  Filled 2012-12-12 (×6): qty 2

## 2012-12-12 NOTE — Evaluation (Signed)
Physical Therapy Evaluation Patient Details Name: Aimee Sanders MRN: [id number] DOB: 1945/09/15 Today's Date: 12/12/2012 Time: [phone] PT Time Calculation (min): 19 min  PT Assessment / Plan / Recommendation History of Present Illness  67 y.o. female with history of CHF(systolic and diastolic), COPD on home oxygen, history of DVT on xarelto presents with complaints of increasing weight gain and lower extremity edema with difficulty breathing. Patient has been having these symptoms for last 2-3 days. In the ER patient had chest x-ray which showed nothing acute but given her clinical presentation patient was given Lasix for possible CHF exacerbation and has been in bed for the same. Patient denies any chest pain productive cough fever chills nausea vomiting abdominal pain diarrhea.  Clinical Impression  Pt functioning near baseline. Overall function limited by low back pain, even PTA. Acute PT to follow to advance mobility to mod I. Pt with good home set up and support as well as DME. Pt safe to d/c home with family once medically stable.    PT Assessment  Patient needs continued PT services    Follow Up Recommendations  No PT follow up;Supervision - Intermittent    Does the patient have the potential to tolerate intense rehabilitation      Barriers to Discharge        Equipment Recommendations  None recommended by PT    Recommendations for Other Services     Frequency Min 3X/week    Precautions / Restrictions Precautions Precautions: Fall Restrictions Weight Bearing Restrictions: No   Pertinent Vitals/Pain 7/10 low back pain      Mobility  Bed Mobility Bed Mobility: Not assessed (pt received on [facility]) Transfers Transfers: Sit to Stand;Stand to Sit Sit to Stand: 5: Supervision;With upper extremity assist;With armrests;From chair/3-in-1 (increased time) Stand to Sit: 5: Supervision;With upper extremity assist;To bed Details for Transfer Assistance: increased  time Ambulation/Gait Ambulation/Gait Assistance: 4: Min guard Ambulation Distance (Feet): 50 Feet Assistive device: Rolling walker Ambulation/Gait Assistance Details: no episodes of LOB, guarded cautious, slow, increased trunk flex Gait Pattern: Step-through pattern;Decreased stride length;Antalgic;Wide base of support Gait velocity: slow Stairs: No    Exercises     PT Diagnosis: Difficulty walking;Generalized weakness  PT Problem List: Decreased strength;Decreased balance;Decreased mobility PT Treatment Interventions: DME instruction;Gait training;Stair training;Functional mobility training;Therapeutic activities;Balance training;Therapeutic exercise     PT Goals(Current goals can be found in the care plan section) Acute Rehab PT Goals Patient Stated Goal: possibly home today PT Goal Formulation: With patient Time For Goal Achievement: 12/19/12 Potential to Achieve Goals: Good  Visit Information  Last PT Received On: 12/12/12 Assistance Needed: +1 History of Present Illness: 67 y.o. female with history of CHF(systolic and diastolic), COPD on home oxygen, history of DVT on xarelto presents with complaints of increasing weight gain and lower extremity edema with difficulty breathing. Patient has been having these symptoms for last 2-3 days. In the ER patient had chest x-ray which showed nothing acute but given her clinical presentation patient was given Lasix for possible CHF exacerbation and has been in bed for the same. Patient denies any chest pain productive cough fever chills nausea vomiting abdominal pain diarrhea.       Prior Functioning  Home Living Family/patient expects to be discharged to:: Private residence Living Arrangements: Spouse/significant other;Children Available Help at Discharge: Family;Available 24 hours/day Type of Home: Mobile home Home Access: Stairs to enter Entrance Stairs-Number of Steps: 10 Entrance Stairs-Rails: Right;Left;Can reach both Home  Layout: One level Home Equipment: Walker - 2 wheels;Cane -  single point;Bedside commode;Shower seat;Grab bars - toilet;Grab bars - tub/shower Prior Function Level of Independence: Independent with assistive device(s);Needs assistance Gait / Transfers Assistance Needed: use cane or RW ADL's / Homemaking Assistance Needed: children/grandchildren do cooking/grocery shopping Communication Communication: HOH Dominant Hand: Right    Cognition  Cognition Arousal/Alertness: Awake/alert Behavior During Therapy: WFL for tasks assessed/performed Overall Cognitive Status: Within Functional Limits for tasks assessed    Extremity/Trunk Assessment Upper Extremity Assessment Upper Extremity Assessment: Generalized weakness Lower Extremity Assessment Lower Extremity Assessment: Generalized weakness (due to low back pain) Cervical / Trunk Assessment Cervical / Trunk Assessment: Kyphotic   Balance Dynamic Standing Balance Dynamic Standing - Balance Support: Bilateral upper extremity supported Dynamic Standing - Level of Assistance: 5: Stand by assistance Dynamic Standing - Balance Activities:  (performed pericare s/p urination)  End of Session PT - End of Session Equipment Utilized During Treatment: Gait belt Activity Tolerance: Patient limited by fatigue Patient left: in bed;with call bell/phone within reach;with nursing/sitter in room (sitting EOB eating breakfast) Nurse Communication: Mobility status (desire for alternative breakfast)  GP     Sujata Maines Marie 12/12/2012, 9:22 AM  Lewis Shock, PT, DPT Pager #: [phone] Office #: [phone]

## 2012-12-12 NOTE — Care Management Note (Addendum)
  Page 2 of 2   12/12/2012     3:58:05 PM   CARE MANAGEMENT NOTE 12/12/2012  Patient:  ADHYA, COCCO   Account Number:  [id number]  Date Initiated:  12/12/2012  Documentation initiated by:  [facility]  Subjective/Objective Assessment:   67 y.o. female with history of CHF(systolic and diastolic), COPD on home oxygen, history of DVT on xarelto presents with complaints of increasing weight gain and lower extremity edema with difficulty breathing.//home with son     Action/Plan:   diurese//home with Home Health   Anticipated DC Date:  12/14/2012   Anticipated DC Plan:  HOME W HOME HEALTH SERVICES      DC Planning Services  CM consult      [facility] Choice  HOME HEALTH   Choice offered to / List presented to:  C-4 Adult Children        HH arranged  HH-1 RN  HH-10 DISEASE MANAGEMENT      HH agency  [facility]   Status of service:  Completed, signed off Medicare Important Message given?   (If response is "NO", the following Medicare IM given date fields will be blank) Date Medicare IM given:   Date Additional Medicare IM given:    Discharge Disposition:    Per UR Regulation:    If discussed at Long Length of Stay Meetings, dates discussed:    Comments:  8/35/14 1500  Oletta Cohn, RN, BSN, [employer] [phone] Spoke with pt, son and daughter-in-law at bedsise regarding discharge planning for Home Health services.  Pt and family familiar with [facility] and requested them. [facility] RN liason notified.  No DME needs identified at this time.

## 2012-12-12 NOTE — Progress Notes (Signed)
Aimee Sanders:[id number] DOB: 05/20/1945 DOA: 12/08/2012 PCP: Rudi Heap, MD  Brief narrative: 67 yr old former smoker till 5/14, known Isch CM and CHF on chronic lasix admitted with multifactorial dyspnea.  This was thought to be 2/2 to AECHF>>AECOPD Lasix seemed to make improvement more so than Breathing Rx.  She was noted to be somewhat hypotensive and her chronic cardiac meds were adjusted to allow for diuresis.  She started to complain of increasing LBP 8/24 and it was noted she had sustained a fall with acute fracture of vertebrae about a month prior at [facility].  PT/Ot were consulted and records were saught from prior facility to help with LBP  Past medical history-As per Problem list Chart reviewed as below- Admission 08/31/12 COPD exacerbation + AECHF Admission 07/26/12 c Strep PNA c AECOPD Admission 06/22/12 C Unstable angina-stress showed possible septal ischemia, stable copd, CHF Admission 03/01/12 AECOPD, compensated CHF Admission Acute Resp failure-thoguth 2/2 to COPD Admission 01/12/12 Acute resp failure, likely multifactorial Admission 11/23/11 AECOPD Admission 5/25/13AECOPD-needed BIPAP and Rx by CCM Admission 07/08/11 A/Chronic resp failure  Consultants:  none  Procedures:  non  Antibiotics:  none   Subjective  LBP better.  No n/v/cp.  Sleeping at bedside.  Son in room.  Denies CP.  Worked better with PT today after being medicated.     Objective    Interim History: nad   Telemetry: NSR c PVC's  Objective: Filed Vitals:   12/12/12 0534 12/12/12 1051 12/12/12 1350 12/12/12 1435  BP: 129/80 120/79  90/43  Pulse: 74 78  88  Temp:    101.8 F (38.8 C)  TempSrc:    Oral  Resp: 20   20  Height:      Weight:      SpO2: 94%  96% 97%    Intake/Output Summary (Last 24 hours) at 12/12/12 1621 Last data filed at 12/12/12 1435  Gross per 24 hour  Intake   1060 ml  Output   3400 ml  Net  -2340 ml    Exam:  General: eomi,  NCAT Cardiovascular: s1 s2 no m/r/g Respiratory: clear Abdomen: soft, NT, ND-has a swelling with irreg margins that is oozing on abdomen Skin 1 +Le edema Neurointact-SLR neg, Hip internal and external rotation neg.  Some spasm over Lower back but poor exam   Data Reviewed: Basic Metabolic Panel:  Recent Labs Lab 12/08/12 1907 12/09/12 0500 12/12/12 1152  NA 137 138 137  K 3.6 3.6 2.8*  CL 92* 91* 89*  CO2 38* 39* 38*  GLUCOSE 91 79 182*  BUN 22 24* 16  CREATININE 0.77 0.85 0.74  CALCIUM 9.6 9.4 9.3  MG  --  2.3  --    Liver Function Tests:  Recent Labs Lab 12/09/12 0500  AST 19  ALT 20  ALKPHOS 62  BILITOT 0.2*  PROT 5.9*  ALBUMIN 2.9*   No results found for this basename: LIPASE, AMYLASE,  in the last 168 hours No results found for this basename: AMMONIA,  in the last 168 hours CBC:  Recent Labs Lab 12/08/12 1907 12/09/12 0500 12/11/12 0440  WBC 10.9* 11.5* 7.1  NEUTROABS 7.1 6.2  --   HGB 10.6* 10.7* 9.6*  HCT 32.2* 33.1* 30.1*  MCV 91.7 92.2 93.5  PLT 353 352 272   Cardiac Enzymes:  Recent Labs Lab 12/08/12 2240  TROPONINI <0.30   BNP: No components found with this basename: POCBNP,  CBG: No results found for this basename:  GLUCAP,  in the last 168 hours  Recent Results (from the past 240 hour(s))  MRSA PCR SCREENING     Status: None   Collection Time    12/08/12 11:21 PM      Result Value Range Status   MRSA by PCR NEGATIVE  NEGATIVE Final   Comment:            The GeneXpert MRSA Assay (FDA     approved for NASAL specimens     only), is one component of a     comprehensive MRSA colonization     surveillance program. It is not     intended to diagnose MRSA     infection nor to guide or     monitor treatment for     MRSA infections.     Studies:              All Imaging reviewed and is as per above notation   Scheduled Meds: . albuterol  2.5 mg Nebulization TID   And  . ipratropium  0.5 mg Nebulization TID  . citalopram  20  mg Oral Daily  . fenofibrate  160 mg Oral Q supper  . furosemide  60 mg Oral BID  . gabapentin  300 mg Oral TID AC   And  . gabapentin  600 mg Oral QHS  . isosorbide mononitrate  15 mg Oral Daily  . metoprolol tartrate  12.5 mg Oral Daily  . mometasone-formoterol  2 puff Inhalation BID  . montelukast  10 mg Oral Daily  . nystatin  500,000 Units Oral QID  . pantoprazole  40 mg Oral Daily  . potassium chloride  40 mEq Oral BID  . Rivaroxaban  20 mg Oral Q supper  . sodium chloride  3 mL Intravenous Q12H  . sodium chloride  3 mL Intravenous Q12H   Continuous Infusions:    Assessment/Plan: 1. Likely decompensated CHF > Acute Exacerbation COPD-continue diuresis with PO lasix 60 bid->40 mg 8/25 Metoprolol 25 cut back to 12.5 daily-given hypotension [which was likely 2/2 to lasix], cut back imdur to 15 daily from 8/23.  Her weights have been anywhere form 58-64 kg over past 6 mo.  She was -3.0 liters overnight, is -7.3 liters overall for hospital visit.  Weight 65-->61 kg.  Nursiong has noted dietary indiscretion sat bedisde.  She will need further encouragement as an OP to decrease salt in her diet 2. Hypokalemia-likely 2/2 to diuresis-replacing orally K 40 bid 3. Severe LBP.  No new trauma.  Has been pretty much bed-bound here-MRI pending.  Has h/o prior fracture.  Trial Fexeril for spasm.  No salient records from OSH, although might be unlikely.  Eval from PT = no follow-up needed per PT 4. Chronic O2 dependant COPD-stable, not acute-uses chronic home O2 no need for coverage with Abx-continue albuterol 2.5 mg nebs tid, qhs metasone-formoterol bid, Monteleukast 10 daily 5. H/o DVT-continue Xarelto 20 qpm 6. Depression-continue Citalopram 20 daily 7. Neuropathy-continue gabapentin 300 tid and 600 8. Hld-cont Fibrate 160 daily  Code Status: Full Family Communication: spoke with son at bedside  Disposition Plan: inpt   Pleas Koch, MD  [facility] Pager [phone] 12/12/2012, 4:21  PM    LOS: 4 days

## 2012-12-12 NOTE — Progress Notes (Signed)
Patient c/o back pain throughout shift. Morphine and Xanax given per MAR.  Son at bedside and active in care.  RN will continue to monitor. Louretta Parma, RN

## 2012-12-13 ENCOUNTER — Encounter (INDEPENDENT_AMBULATORY_CARE_PROVIDER_SITE_OTHER): Payer: Self-pay | Admitting: Surgery

## 2012-12-13 ENCOUNTER — Inpatient Hospital Stay (HOSPITAL_COMMUNITY): Payer: PRIVATE HEALTH INSURANCE

## 2012-12-13 LAB — CBC WITH DIFFERENTIAL/PLATELET
Basophils Absolute: 0 10*3/uL (ref 0.0–0.1)
Basophils Relative: 0 % (ref 0–1)
HCT: 32.8 % — ABNORMAL LOW (ref 36.0–46.0)
Hemoglobin: 11 g/dL — ABNORMAL LOW (ref 12.0–15.0)
Lymphocytes Relative: 19 % (ref 12–46)
MCHC: 33.5 g/dL (ref 30.0–36.0)
Neutro Abs: 10.5 10*3/uL — ABNORMAL HIGH (ref 1.7–7.7)
Neutrophils Relative %: 73 % (ref 43–77)
RDW: 14.2 % (ref 11.5–15.5)
WBC: 14.4 10*3/uL — ABNORMAL HIGH (ref 4.0–10.5)

## 2012-12-13 LAB — URINALYSIS, ROUTINE W REFLEX MICROSCOPIC
Bilirubin Urine: NEGATIVE
Nitrite: NEGATIVE
Specific Gravity, Urine: 1.013 (ref 1.005–1.030)
Urobilinogen, UA: 1 mg/dL (ref 0.0–1.0)
pH: 7 (ref 5.0–8.0)

## 2012-12-13 LAB — URINE MICROSCOPIC-ADD ON

## 2012-12-13 LAB — STREP PNEUMONIAE URINARY ANTIGEN: Strep Pneumo Urinary Antigen: NEGATIVE

## 2012-12-13 MED ORDER — SODIUM CHLORIDE 0.9 % IV BOLUS (SEPSIS)
250.0000 mL | Freq: Once | INTRAVENOUS | Status: AC
  Administered 2012-12-13: 250 mL via INTRAVENOUS

## 2012-12-13 MED ORDER — VANCOMYCIN HCL 10 G IV SOLR
1250.0000 mg | Freq: Once | INTRAVENOUS | Status: AC
  Administered 2012-12-13: 1250 mg via INTRAVENOUS
  Filled 2012-12-13: qty 1250

## 2012-12-13 MED ORDER — DEXTROSE 5 % IV SOLN
1.0000 g | Freq: Three times a day (TID) | INTRAVENOUS | Status: DC
  Administered 2012-12-13 – 2012-12-17 (×13): 1 g via INTRAVENOUS
  Filled 2012-12-13 (×14): qty 1

## 2012-12-13 MED ORDER — VANCOMYCIN HCL 10 G IV SOLR
1250.0000 mg | INTRAVENOUS | Status: DC
  Administered 2012-12-14 – 2012-12-17 (×4): 1250 mg via INTRAVENOUS
  Filled 2012-12-13 (×4): qty 1250

## 2012-12-13 MED ORDER — NICOTINE 7 MG/24HR TD PT24
7.0000 mg | MEDICATED_PATCH | Freq: Every day | TRANSDERMAL | Status: DC
  Administered 2012-12-13 – 2012-12-17 (×5): 7 mg via TRANSDERMAL
  Filled 2012-12-13 (×5): qty 1

## 2012-12-13 NOTE — Progress Notes (Signed)
Patients BP's have been trending soft. Please check BP before giving any BP medications. Stanton Kidney R

## 2012-12-13 NOTE — Progress Notes (Signed)
Patients BP was 90/50 at 1320. Got orders from MD to give 250 CC Bolus. After bolus patient BP was 104/44. MD discontinued lasix and ordered to hold evening dose of metoprolol. Will also pass on to night shift to make sure that the patient's BP is being checked before giving any BP medications.

## 2012-12-13 NOTE — Progress Notes (Signed)
Patient Aimee Sanders 38.3 PO.  NP Craige Cotta made aware.  Awaiting new orders. RN will continue to monitor. Louretta Parma, RN

## 2012-12-13 NOTE — Progress Notes (Signed)
ANTIBIOTIC CONSULT NOTE - INITIAL  Pharmacy Consult for Vancomycin Indication: pneumonia  Allergies  Allergen Reactions  . Aspirin Other (See Comments)    Patient states no reaction, only told not to take while on other blood thinner  . Levofloxacin Hives  . Other Itching and Rash  . Penicillins Hives and Itching    Patient Measurements: Height: 5\' 3"  (160 cm) Weight: 133 lb 4.8 oz (60.464 kg) (B scale) IBW/kg (Calculated) : 52.4  Vital Signs: Temp: 101 F (38.3 C) (08/26 0539) Temp src: Oral (08/26 0539) BP: 104/44 mmHg (08/26 0539) Pulse Rate: 104 (08/26 0539) Intake/Output from previous day: 08/25 0701 - 08/26 0700 In: 1040 [P.O.:1040] Out: 2500 [Urine:2500] Intake/Output from this shift:    Labs:  Recent Labs  12/11/12 0440 12/12/12 1152 12/13/12 0640  WBC 7.1  --  14.4*  HGB 9.6*  --  11.0*  PLT 272  --  268  CREATININE  --  0.74  --    Estimated Creatinine Clearance: 55.7 ml/min (by C-G formula based on Cr of 0.74). No results found for this basename: VANCOTROUGH, VANCOPEAK, VANCORANDOM, GENTTROUGH, GENTPEAK, GENTRANDOM, TOBRATROUGH, TOBRAPEAK, TOBRARND, AMIKACINPEAK, AMIKACINTROU, AMIKACIN,  in the last 72 hours   Medical History: Past Medical History  Diagnosis Date  . Chronic combined systolic and diastolic CHF (congestive heart failure)     11/2011 - Echo: EF 40-45% w/ Gr 1 DD.  Marland Kitchen Deep vein thrombosis     Chronic Xarelto.  . Hyperlipidemia   . Sleep apnea   . 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 Coronary atherosclerosis of native coronary artery     a. reportedly s/p MI x 2;  b. March 2014 Cardiolite: EF 68% with scar affecting the inferolateral and anterolateral wall; mild ischemia in the mid-septum-> Med Rx.  . Chronic pain     Due to prior hip fracture and DDD back  . Peripheral vascular disease   . Pneumonia     "now and several times before" (07/26/2012)  . H/O hiatal  hernia   . Stroke 2002; 2004    Denies residual; "both really light strokes" (07/26/2012)  . Arthritis     "all over" (07/26/2012)  . DDD (degenerative disc disease), lumbosacral   . Depression   . Anxiety     Medications:  Scheduled:  . albuterol  2.5 mg Nebulization TID   And  . ipratropium  0.5 mg Nebulization TID  . ceFEPime (MAXIPIME) IV  1 g Intravenous Q8H  . citalopram  20 mg Oral Daily  . fenofibrate  160 mg Oral Q supper  . furosemide  40 mg Oral BID  . gabapentin  300 mg Oral TID AC   And  . gabapentin  600 mg Oral QHS  . isosorbide mononitrate  15 mg Oral Daily  . metoprolol tartrate  12.5 mg Oral Daily  . mometasone-formoterol  2 puff Inhalation BID  . montelukast  10 mg Oral Daily  . nystatin  500,000 Units Oral QID  . pantoprazole  40 mg Oral Daily  . potassium chloride  40 mEq Oral BID  . Rivaroxaban  20 mg Oral Q supper  . sodium chloride  3 mL Intravenous Q12H  . sodium chloride  3 mL Intravenous Q12H   Assessment: 67 yo female with leukocytosis/fevers, probable PNA, for empiric antibiotics  Goal of Therapy:  Vancomycin trough level 15-20 mcg/ml  Plan:  Vancomycin 1250 mg IV  now, then q24h  Eddie Candle 12/13/2012,7:49 AM

## 2012-12-13 NOTE — Progress Notes (Signed)
Orthopedic Tech Progress Note Patient Details:  Aimee Sanders [birth year] [id number]  Patient ID: [name], female   DOB: [birth year], 67 y.o.   MRN: [id number] Brace order completed by Storm Frisk, Yessica Putnam 12/13/2012, 1:19 PM

## 2012-12-13 NOTE — Progress Notes (Signed)
Patient complaining of back pain, pain assesed and pain meds administered; will continue to monitor patient. Lorretta Harp RN
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
## 2012-12-13 NOTE — Progress Notes (Signed)
PT Cancellation Note  Patient Details Name: Aimee Sanders MRN: [id number] DOB: 1945-11-30   Cancelled Treatment:     Pt refusing therapy at this time due to not feeling well despite encouragement to attempt participation.  Pt requesting therapy to return tomorrow.      Verdell Face, [place] [phone] 12/13/2012

## 2012-12-13 NOTE — Progress Notes (Signed)
Patient using e.cigarette at bedside. Staff notified family of [facility] policy.  Patient and Son stated their understanding.  NP made aware of need for Nicotine patch.  RN will continue to monitor. Louretta Parma, RN

## 2012-12-13 NOTE — Evaluation (Signed)
Clinical/Bedside Swallow Evaluation Patient Details  Name: Aimee Sanders MRN: [id number] Date of Birth: July 02, 1945  Today's Date: 12/13/2012 Time: 1410-1440 SLP Time Calculation (min): 30 min  Past Medical History:  Past Medical History  Diagnosis Date  . Chronic combined systolic and diastolic CHF (congestive heart failure)     11/2011 - Echo: EF 40-45% w/ Gr 1 DD.  Marland Kitchen Deep vein thrombosis     Chronic Xarelto.  . Hyperlipidemia   . Sleep apnea   . 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 Coronary atherosclerosis of native coronary artery     a. reportedly s/p MI x 2;  b. March 2014 Cardiolite: EF 68% with scar affecting the inferolateral and anterolateral wall; mild ischemia in the mid-septum-> Med Rx.  . Chronic pain     Due to prior hip fracture and DDD back  . Peripheral vascular disease   . Pneumonia     "now and several times before" (07/26/2012)  . H/O hiatal hernia   . Stroke 2002; 2004    Denies residual; "both really light strokes" (07/26/2012)  . Arthritis     "all over" (07/26/2012)  . DDD (degenerative disc disease), lumbosacral   . Depression   . Anxiety    Past Surgical History:  Past Surgical History  Procedure Laterality Date  . Appendectomy    . Cholecystectomy    . Carpal tunnel release Bilateral   . Coronary angioplasty with stent placement      "1; total of 1" (07/26/2012)  . Total hip arthroplasty Right   . Hernia repair      "stomach" (07/26/2012)  . Abdominal hysterectomy    . Fracture surgery    . Foot fracture surgery Left   . Breast surgery Bilateral     benign tumors  . Tumor excision      "stomach and LLE" (07/26/2012)  . Back surgery    . Fixation kyphoplasty lumbar spine     HPI:  67 yo female adm to [facility] with respiratory difficulties- multifactorial dyspnea.  PMH + for COPD, multiple admits, vertebral fx after fall last month, GERD.  Pt CXR indicated new left lower lobe  opacity suspicious for pna - HCAP vs ASP pna.      Assessment / Plan / Recommendation Clinical Impression  Pt presents with functional oropharyngeal swallow assessed at bedside.  No s/s of aspiration with all po observed *grapes and soda.  Swallow was timely without coughing or indications of stasis.  Pt denies choking with po but does admit to reflux- and she is on a PPI but reports same medicine and dosage for some time.   Pt is chronically hoarse x months - ? secondary to GERD.  Pt admits to having reflux symptoms consistently that may contribute to pna.  Xerostomia reported by pt - provided her with bitoene and compensation tips.    SLP educated family and pt to general aspiration and reflux precautions. Encouraged pt to speak to MD regarding her reflux issues.     Will sign off, please reorder if desire.      Aspiration Risk  Mild    Diet Recommendation Regular;Thin liquid   Liquid Administration via: Cup;Straw Medication Administration: Whole meds with liquid Supervision: Patient able to self feed Compensations: Slow rate;Small sips/bites Postural Changes and/or Swallow Maneuvers: Seated upright 90 degrees;Upright 30-60 min after meal    Other  Recommendations Oral Care Recommendations: Oral  care BID   Follow Up Recommendations  None    Frequency and Duration   n/a     Pertinent Vitals/Pain Afebrile, decreased    SLP Swallow Goals     Swallow Study Prior Functional Status   eats softer foods at home, does not eat meat    General Date of Onset: 12/13/12 HPI: 67 yo female adm to [facility] with respiratory difficulties- multifactorial dyspnea.  PMH + for COPD, multiple admits, vertebral fx after fall last month, GERD.  Pt CXR indicated new left lower lobe opacity suspicious for pna - HCAP vs ASP pna.    Type of Study: Bedside swallow evaluation Diet Prior to this Study: Regular;Thin liquids Temperature Spikes Noted: Yes Respiratory Status: Supplemental O2 delivered via  (comment) History of Recent Intubation: No Behavior/Cognition: Alert;Cooperative;Pleasant mood Oral Cavity - Dentition: Edentulous (does not wear dentures, loose) Self-Feeding Abilities: Able to feed self Patient Positioning: Upright in chair Baseline Vocal Quality: Hoarse (pt and son report hoarseness comes and goes) Volitional Cough: Weak Volitional Swallow: Able to elicit    Oral/Motor/Sensory Function Overall Oral Motor/Sensory Function: Appears within functional limits for tasks assessed   Ice Chips Ice chips: Not tested   Thin Liquid Thin Liquid: Within functional limits Presentation: Cup;Self Fed;Straw    Nectar Thick Nectar Thick Liquid: Not tested   Honey Thick Honey Thick Liquid: Not tested   Puree Puree: Not tested   Solid   GO    Solid: Within functional limits Presentation: Self Fed;Spoon Other Comments: grapes - WFL self feeding       Mills Koller, MS [facility] SLP [phone]
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
## 2012-12-14 DIAGNOSIS — D649 Anemia, unspecified: Secondary | ICD-10-CM

## 2012-12-14 DIAGNOSIS — M549 Dorsalgia, unspecified: Secondary | ICD-10-CM

## 2012-12-14 LAB — CBC
HCT: 27.1 % — ABNORMAL LOW (ref 36.0–46.0)
Hemoglobin: 8.8 g/dL — ABNORMAL LOW (ref 12.0–15.0)
MCH: 29.8 pg (ref 26.0–34.0)
MCHC: 32.5 g/dL (ref 30.0–36.0)
MCV: 91.9 fL (ref 78.0–100.0)
Platelets: 239 10*3/uL (ref 150–400)
RBC: 2.95 MIL/uL — ABNORMAL LOW (ref 3.87–5.11)
RDW: 14.3 % (ref 11.5–15.5)
WBC: 10 10*3/uL (ref 4.0–10.5)

## 2012-12-14 LAB — BASIC METABOLIC PANEL
BUN: 18 mg/dL (ref 6–23)
CO2: 29 mEq/L (ref 19–32)
Calcium: 9.2 mg/dL (ref 8.4–10.5)
Chloride: 98 mEq/L (ref 96–112)
Creatinine, Ser: 0.71 mg/dL (ref 0.50–1.10)
GFR calc Af Amer: >90 mL/min (ref >90)
GFR calc non Af Amer: 87 mL/min — ABNORMAL LOW (ref >90)
Glucose, Bld: 113 mg/dL — ABNORMAL HIGH (ref 70–99)
Potassium: 5.2 mEq/L — ABNORMAL HIGH (ref 3.5–5.1)
Sodium: 135 mEq/L (ref 135–145)

## 2012-12-14 LAB — LEGIONELLA ANTIGEN, URINE: Legionella Antigen, Urine: NEGATIVE

## 2012-12-14 MED ORDER — SENNA 8.6 MG PO TABS
1.0000 | ORAL_TABLET | Freq: Every day | ORAL | Status: DC
  Administered 2012-12-14 – 2012-12-17 (×4): 8.6 mg via ORAL
  Filled 2012-12-14 (×4): qty 1

## 2012-12-14 MED ORDER — POLYETHYLENE GLYCOL 3350 17 G PO PACK
17.0000 g | PACK | Freq: Every day | ORAL | Status: DC
  Administered 2012-12-14 – 2012-12-17 (×4): 17 g via ORAL
  Filled 2012-12-14 (×4): qty 1

## 2012-12-14 MED ORDER — DOCUSATE SODIUM 100 MG PO CAPS
100.0000 mg | ORAL_CAPSULE | Freq: Two times a day (BID) | ORAL | Status: DC
  Administered 2012-12-14 – 2012-12-17 (×5): 100 mg via ORAL
  Filled 2012-12-14 (×7): qty 1

## 2012-12-14 NOTE — Progress Notes (Signed)
Pt having better results from po pain meds this afternoon.
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
## 2012-12-14 NOTE — Progress Notes (Signed)
BP low this morning (96/30) Lying, but quickly recovered with no interventions.  NP Craige Cotta was paged, but no call back was made after several attempts.  RN will continue to monitor and pass on to oncoming RN. Louretta Parma, RN
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
## 2012-12-14 NOTE — Progress Notes (Signed)
Unable to do orthostatic vs due to c/o severe low back pain and much difficulty moving

## 2012-12-14 NOTE — Progress Notes (Signed)
Physical Therapy Treatment Patient Details Name: Aimee Sanders MRN: [id number] DOB: 1946-02-13 Today's Date: 12/14/2012 Time: 1478-2956 PT Time Calculation (min): 16 min  PT Assessment / Plan / Recommendation  History of Present Illness 67 y.o. female with history of CHF(systolic and diastolic), COPD on home oxygen, history of DVT on xarelto presents with complaints of increasing weight gain and lower extremity edema with difficulty breathing. Patient has been having these symptoms for last 2-3 days. In the ER patient had chest x-ray which showed nothing acute but given her clinical presentation patient was given Lasix for possible CHF exacerbation and has been in bed for the same. Patient denies any chest pain productive cough fever chills nausea vomiting abdominal pain diarrhea.   PT Comments   Mobility cont's to be limited by back pain.  Back brace present in room upon arrival therefore used brace.  Pt reports increased comfort with brace.  She was able to increase ambulation distance today but becomes very SOB on 3L 02 (unable to speak complete sentence).     Follow Up Recommendations  No PT follow up;Supervision - Intermittent     Does the patient have the potential to tolerate intense rehabilitation     Barriers to Discharge        Equipment Recommendations  None recommended by PT    Recommendations for Other Services    Frequency Min 3X/week   Progress towards PT Goals    Plan Current plan remains appropriate    Precautions / Restrictions Precautions Precautions: Fall Restrictions Weight Bearing Restrictions: No   Pertinent Vitals/Pain C/o back pain.  Premedicated.  +SOB with activity on 3L 02 via [place]    Mobility  Bed Mobility Bed Mobility: Rolling Left;Left Sidelying to Sit;Sit to Sidelying Left Rolling Left: 5: Supervision;With rail Left Sidelying to Sit: HOB flat;With rails;3: Mod assist Sit to Sidelying Left: HOB flat;4: Min assist Details for Bed Mobility  Assistance: (A) to lift shoulders/trunk to sitting upright & to lift LE's back into bed.  Incr time due to back pain.   Transfers Transfers: Sit to Stand;Stand to Sit Sit to Stand: 4: Min guard;With upper extremity assist;From bed Stand to Sit: 5: Supervision;With upper extremity assist;To bed Details for Transfer Assistance: cues for hand placement.  Incr time due to pain Ambulation/Gait Ambulation/Gait Assistance: 4: Min guard Ambulation Distance (Feet): 120 Feet Assistive device: Rolling walker Ambulation/Gait Assistance Details: Slow, cautious, guarded gait due to pain.  Cues to relax UE's & for pursed lip breathing.  Pt with SOB & unable to speak sentence due to SOB Gait Pattern: Step-through pattern;Decreased stride length Gait velocity: slow Stairs: No Wheelchair Mobility Wheelchair Mobility: No      PT Goals (current goals can now be found in the care plan section) Acute Rehab PT Goals PT Goal Formulation: With patient Time For Goal Achievement: 12/19/12 Potential to Achieve Goals: Good  Visit Information  Last PT Received On: 12/14/12 Assistance Needed: +1 History of Present Illness: 67 y.o. female with history of CHF(systolic and diastolic), COPD on home oxygen, history of DVT on xarelto presents with complaints of increasing weight gain and lower extremity edema with difficulty breathing. Patient has been having these symptoms for last 2-3 days. In the ER patient had chest x-ray which showed nothing acute but given her clinical presentation patient was given Lasix for possible CHF exacerbation and has been in bed for the same. Patient denies any chest pain productive cough fever chills nausea vomiting abdominal pain diarrhea.  Subjective Data      Cognition  Cognition Arousal/Alertness: Awake/alert Behavior During Therapy: WFL for tasks assessed/performed Overall Cognitive Status: Within Functional Limits for tasks assessed    Balance     End of Session PT - End  of Session Equipment Utilized During Treatment: Gait belt;Back brace Patient left: in bed;with call bell/phone within reach;with family/visitor present Nurse Communication: Mobility status   GP     Lara Mulch 12/14/2012, 1:48 PM   Verdell Face, PTA [phone] 12/14/2012

## 2012-12-15 LAB — CBC
Hemoglobin: 8.5 g/dL — ABNORMAL LOW (ref 12.0–15.0)
MCH: 30 pg (ref 26.0–34.0)
MCHC: 32.3 g/dL (ref 30.0–36.0)
Platelets: 251 10*3/uL (ref 150–400)

## 2012-12-15 LAB — BASIC METABOLIC PANEL
BUN: 14 mg/dL (ref 6–23)
Calcium: 9.2 mg/dL (ref 8.4–10.5)
GFR calc non Af Amer: >90 mL/min (ref >90)
Glucose, Bld: 107 mg/dL — ABNORMAL HIGH (ref 70–99)

## 2012-12-15 LAB — FERRITIN: Ferritin: 308 ng/mL — ABNORMAL HIGH (ref 10–291)

## 2012-12-15 LAB — IRON AND TIBC: UIBC: 218 ug/dL (ref 125–400)

## 2012-12-15 NOTE — Progress Notes (Signed)
Confirmed with pt that she recieves home 02 through [facility].  Spoke with Dairen of [facility] to arrange for 02 tank for home transport as MD plans to D/C pt home tomorrow.

## 2012-12-15 NOTE — Progress Notes (Signed)
Report given to night nurse, patient is stable.  D. Caio Devera RN 
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
## 2012-12-16 DIAGNOSIS — J9620 Acute and chronic respiratory failure, unspecified whether with hypoxia or hypercapnia: Secondary | ICD-10-CM

## 2012-12-16 LAB — BASIC METABOLIC PANEL
BUN: 10 mg/dL (ref 6–23)
CO2: 29 mEq/L (ref 19–32)
GFR calc non Af Amer: >90 mL/min (ref >90)
Glucose, Bld: 95 mg/dL (ref 70–99)
Potassium: 3.6 mEq/L (ref 3.5–5.1)

## 2012-12-16 LAB — CBC
HCT: 25.8 % — ABNORMAL LOW (ref 36.0–46.0)
Hemoglobin: 8.2 g/dL — ABNORMAL LOW (ref 12.0–15.0)
MCHC: 31.8 g/dL (ref 30.0–36.0)
RBC: 2.79 MIL/uL — ABNORMAL LOW (ref 3.87–5.11)

## 2012-12-16 MED ORDER — OXYCODONE-ACETAMINOPHEN 5-325 MG PO TABS
1.0000 | ORAL_TABLET | Freq: Four times a day (QID) | ORAL | Status: DC | PRN
  Administered 2012-12-16 (×3): 1 via ORAL
  Filled 2012-12-16 (×2): qty 1

## 2012-12-16 MED ORDER — OXYCODONE HCL 5 MG PO TABS
5.0000 mg | ORAL_TABLET | Freq: Four times a day (QID) | ORAL | Status: DC | PRN
Start: 2012-12-16 — End: 2012-12-17
  Administered 2012-12-16 (×3): 5 mg via ORAL
  Filled 2012-12-16 (×3): qty 1

## 2012-12-16 MED ORDER — SODIUM CHLORIDE 0.9 % IV SOLN
25.0000 mg | Freq: Once | INTRAVENOUS | Status: DC
  Filled 2012-12-16: qty 2

## 2012-12-16 MED ORDER — SODIUM CHLORIDE 0.9 % IV SOLN
125.0000 mg | Freq: Once | INTRAVENOUS | Status: AC
  Administered 2012-12-16: 125 mg via INTRAVENOUS
  Filled 2012-12-16: qty 10

## 2012-12-16 NOTE — Consult Note (Signed)
Referring Provider: Dr. Franco Nones Primary Care Physician:  Rudi Heap, MD Primary Gastroenterologist:  None (unassigned)  Reason for Consultation:  Anemia  HPI: Aimee Sanders is a 67 y.o. female admitted to the hospital about a week ago with community acquired pneumonia, superimposed on COPD. She has severe medical problems including oxygen-dependent COPD, CHF, and chronic anticoagulation because of a history of DVT.  Since admission, it was noted that there has been a progressive drop in her hemoglobin which has go down from approximately 11 when she came into the hospital, to a current level of 8.5.   Iron studies show a low saturation of 9% but an elevated ferritin level of 308. The patient indicates she's been anemic "all her life."   She believes she had a colonoscopy at [facility] some years ago, but is not actively followed by gastroenterologist. She does not have ongoing localizing GI tract symptoms. She does have a tendency for reflux symptoms which are controlled by the daily use of omeprazole. She does not have issues with anorexia, nausea, abdominal pain, constipation, diarrhea, or any unobserved rectal bleeding.  As an outpatient, the patient denies exposure to aspirin or nonsteroidal anti-inflammatory drugs, but was maintained on Xarelto because of a history of DVT.  Unfortunately, the patient is having significant pain from lumbar compression fractures for which she wears a brace, but it is painful for her to move around.   Past Medical History  Diagnosis Date  . Chronic combined systolic and diastolic CHF (congestive heart failure)     11/2011 - Echo: EF 40-45% w/ Gr 1 DD.  Marland Kitchen Deep vein thrombosis     Chronic Xarelto.  . Hyperlipidemia   . Sleep apnea   . 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 Coronary atherosclerosis of native coronary artery     a. reportedly s/p MI x 2;  b. March 2014  Cardiolite: EF 68% with scar affecting the inferolateral and anterolateral wall; mild ischemia in the mid-septum-> Med Rx.  . Chronic pain     Due to prior hip fracture and DDD back  . Peripheral vascular disease   . Pneumonia     "now and several times before" (07/26/2012)  . H/O hiatal hernia   . Stroke 2002; 2004    Denies residual; "both really light strokes" (07/26/2012)  . Arthritis     "all over" (07/26/2012)  . DDD (degenerative disc disease), lumbosacral   . Depression   . Anxiety     Past Surgical History  Procedure Laterality Date  . Appendectomy    . Cholecystectomy    . Carpal tunnel release Bilateral   . Coronary angioplasty with stent placement      "1; total of 1" (07/26/2012)  . Total hip arthroplasty Right   . Hernia repair      "stomach" (07/26/2012)  . Abdominal hysterectomy    . Fracture surgery    . Foot fracture surgery Left   . Breast surgery Bilateral     benign tumors  . Tumor excision      "stomach and LLE" (07/26/2012)  . Back surgery    . Fixation kyphoplasty lumbar spine      Prior to Admission medications   Medication Sig Start Date End Date Taking? Authorizing Provider  albuterol (PROVENTIL) (2.5 MG/3ML) 0.083% nebulizer solution Take 2.5 mg by nebulization every 6 (six) hours as needed for wheezing.   Yes  Historical Provider, MD  ALPRAZolam Prudy Feeler) 0.5 MG tablet Take 1 tablet (0.5 mg total) by mouth 4 (four) times daily as needed for anxiety. 12/07/12  Yes Mary-Margaret Daphine Deutscher, FNP  Alum & Mag Hydroxide-Simeth (MAGIC MOUTHWASH) SOLN Take 10 mL by mouth 4 (four) times daily. 12/02/12  Yes Mary-Margaret Daphine Deutscher, FNP  citalopram (CELEXA) 20 MG tablet Take 1 tablet (20 mg total) by mouth daily. 10/24/12  Yes Mary-Margaret Daphine Deutscher, FNP  fenofibrate 160 MG tablet Take 1 tablet (160 mg total) by mouth daily. 12/01/12  Yes Mae Shelda Altes, FNP  Fluticasone-Salmeterol (ADVAIR) 250-50 MCG/DOSE AEPB Inhale 1 puff into the lungs every 12 (twelve) hours. 07/20/12  Yes  Mary-Margaret Daphine Deutscher, FNP  furosemide (LASIX) 40 MG tablet Take 1 tablet (40 mg total) by mouth daily. 09/06/12  Yes Richarda Overlie, MD  gabapentin (NEURONTIN) 300 MG capsule Take 300-600 mg by mouth 4 (four) times daily. Takes 1 capsule in morning, 1 capsule at noon, 1capsule in evening and 2 capsules at bedtime, every day.   Yes Historical Provider, MD  ipratropium-albuterol (DUONEB) 0.5-2.5 (3) MG/3ML SOLN Take 3 mLs by nebulization 3 (three) times daily. For shortness of breath or wheezing 07/27/12  Yes Mary-Margaret Daphine Deutscher, FNP  isosorbide mononitrate (IMDUR) 30 MG 24 hr tablet Take 1 tablet (30 mg total) by mouth daily. 12/01/12  Yes Mae Shelda Altes, FNP  metoprolol tartrate (LOPRESSOR) 25 MG tablet Take 25 mg by mouth daily.   Yes Historical Provider, MD  montelukast (SINGULAIR) 10 MG tablet Take 10 mg by mouth daily. 07/06/12  Yes Mary-Margaret Daphine Deutscher, FNP  nitroGLYCERIN (NITROSTAT) 0.4 MG SL tablet Place 0.4 mg under the tongue every 5 (five) minutes as needed. For chest pain   Yes Historical Provider, MD  nystatin (MYCOSTATIN) 100000 UNIT/ML suspension Take 5 mL (500,000 Units total) by mouth 4 (four) times daily. Use 48 hours after symptoms resolved 12/05/12  Yes Mae Shelda Altes, FNP  omeprazole (PRILOSEC) 20 MG capsule Take 20 mg by mouth daily.   Yes Historical Provider, MD  ondansetron (ZOFRAN) 4 MG tablet Take 4 mg by mouth every 6 (six) hours as needed for nausea. 06/04/12  Yes Nicoletta Dress. Colon Branch, MD  oxyCODONE-acetaminophen (PERCOCET) 10-325 MG per tablet Take 1 tablet by mouth every 8 (eight) hours as needed for pain. 12/05/12  Yes Mae Shelda Altes, FNP  Rivaroxaban (XARELTO) 20 MG TABS tablet Take 20 mg by mouth daily.   Yes Historical Provider, MD  sulfamethoxazole-trimethoprim (BACTRIM DS) 800-160 MG per tablet Take 1 tablet by mouth 2 (two) times daily. Started 11/23/12, for 10days, ended 12/02/12.    Historical Provider, MD    Current Facility-Administered Medications  Medication Dose Route  Frequency Provider Last Rate Last Dose  . acetaminophen (TYLENOL) tablet 650 mg  650 mg Oral Q6H PRN Eduard Clos, MD   650 mg at 12/13/12 0645   Or  . acetaminophen (TYLENOL) suppository 650 mg  650 mg Rectal Q6H PRN Eduard Clos, MD      . albuterol (PROVENTIL) (5 MG/ML) 0.5% nebulizer solution 2.5 mg  2.5 mg Nebulization Q4H PRN Eduard Clos, MD   2.5 mg at 12/15/12 0951  . albuterol (PROVENTIL) (5 MG/ML) 0.5% nebulizer solution 2.5 mg  2.5 mg Nebulization TID Eduard Clos, MD   2.5 mg at 12/16/12 0454   And  . ipratropium (ATROVENT) nebulizer solution 0.5 mg  0.5 mg Nebulization TID Eduard Clos, MD   0.5 mg at 12/16/12 0826  . ALPRAZolam Prudy Feeler) tablet  0.5 mg  0.5 mg Oral QID PRN Eduard Clos, MD   0.5 mg at 12/15/12 2224  . ceFEPIme (MAXIPIME) 1 g in dextrose 5 % 50 mL IVPB  1 g Intravenous Q8H Rhetta Mura, MD   1 g at 12/16/12 0801  . citalopram (CELEXA) tablet 20 mg  20 mg Oral Daily Eduard Clos, MD   20 mg at 12/16/12 1035  . cyclobenzaprine (FLEXERIL) tablet 5 mg  5 mg Oral TID PRN Rhetta Mura, MD   5 mg at 12/16/12 1114  . docusate sodium (COLACE) capsule 100 mg  100 mg Oral BID Esperanza Sheets, MD   100 mg at 12/16/12 1034  . fenofibrate tablet 160 mg  160 mg Oral Q supper Eduard Clos, MD   160 mg at 12/15/12 1718  . ferric gluconate (NULECIT) 125 mg in sodium chloride 0.9 % 100 mL IVPB  125 mg Intravenous Once Esperanza Sheets, MD      . gabapentin (NEURONTIN) capsule 300 mg  300 mg Oral TID AC Eduard Clos, MD   300 mg at 12/16/12 1035   And  . gabapentin (NEURONTIN) capsule 600 mg  600 mg Oral QHS Eduard Clos, MD   600 mg at 12/15/12 2222  . isosorbide mononitrate (IMDUR) 24 hr tablet 15 mg  15 mg Oral Daily Rhetta Mura, MD   15 mg at 12/16/12 1035  . metoprolol tartrate (LOPRESSOR) tablet 12.5 mg  12.5 mg Oral Daily Rhetta Mura, MD   12.5 mg at 12/16/12 1035  .  mometasone-formoterol (DULERA) 100-5 MCG/ACT inhaler 2 puff  2 puff Inhalation BID Eduard Clos, MD   2 puff at 12/16/12 [id number]  . montelukast (SINGULAIR) tablet 10 mg  10 mg Oral Daily Eduard Clos, MD   10 mg at 12/16/12 1035  . morphine 2 MG/ML injection 2 mg  2 mg Intravenous Q4H PRN Stephani Police, PA-C   2 mg at 12/16/12 0759  . nicotine (NICODERM CQ - dosed in mg/24 hr) patch 7 mg  7 mg Transdermal Daily Leda Gauze, NP   7 mg at 12/16/12 1038  . nitroGLYCERIN (NITROSTAT) SL tablet 0.4 mg  0.4 mg Sublingual Q5 min PRN Eduard Clos, MD      . nystatin (MYCOSTATIN) 100000 UNIT/ML suspension 500,000 Units  500,000 Units Oral QID Eduard Clos, MD   500,000 Units at 12/16/12 1037  . ondansetron (ZOFRAN) tablet 4 mg  4 mg Oral Q6H PRN Eduard Clos, MD      . oxyCODONE (Oxy IR/ROXICODONE) immediate release tablet 5 mg  5 mg Oral Q6H PRN Esperanza Sheets, MD   5 mg at 12/16/12 1036   And  . oxyCODONE-acetaminophen (PERCOCET/ROXICET) 5-325 MG per tablet 1 tablet  1 tablet Oral Q6H PRN Esperanza Sheets, MD   1 tablet at 12/16/12 1036  . pantoprazole (PROTONIX) EC tablet 40 mg  40 mg Oral Daily Eduard Clos, MD   40 mg at 12/16/12 1035  . polyethylene glycol (MIRALAX / GLYCOLAX) packet 17 g  17 g Oral Daily Esperanza Sheets, MD   17 g at 12/16/12 1034  . senna (SENOKOT) tablet 8.6 mg  1 tablet Oral Daily Esperanza Sheets, MD   8.6 mg at 12/16/12 1035  . sodium chloride 0.9 % injection 3 mL  3 mL Intravenous Q12H Eduard Clos, MD   3 mL at 12/15/12 2222  . sodium chloride 0.9 % injection  3 mL  3 mL Intravenous Q12H Eduard Clos, MD   3 mL at 12/15/12 2222  . vancomycin (VANCOCIN) 1,250 mg in sodium chloride 0.9 % 250 mL IVPB  1,250 mg Intravenous Q24H Rhetta Mura, MD   1,250 mg at 12/16/12 0549    Allergies as of 12/08/2012 - Review Complete 12/08/2012  Allergen Reaction Noted  . Aspirin Other (See Comments) 01/21/2011  .  Levofloxacin Hives 08/31/2012  . Other Itching and Rash 12/08/2012  . Penicillins Hives and Itching     Family History  Problem Relation Age of Onset  . Hypertension Mother   . Breast cancer Mother   . Rheum arthritis Maternal Grandmother   . Cancer Sister     History   Social History  . Marital Status: Married    Spouse Name: N/A    Number of Children: N/A  . Years of Education: N/A   Occupational History  . Retired    Social History Main Topics  . Smoking status: Former Smoker -- 1.00 packs/day for 55 years    Types: Cigarettes    Quit date: 08/18/2012  . Smokeless tobacco: Never Used  . Alcohol Use: No  . Drug Use: No  . Sexual Activity: No   Other Topics Concern  . Not on file   Social History Narrative   Lives alone    Review of Systems: See history of present illness  Physical Exam: Vital signs in last 24 hours: Temp:  [98.5 F (36.9 C)-98.7 F (37.1 C)] 98.7 F (37.1 C) (08/29 1030) Pulse Rate:  [75-87] 87 (08/29 1030) Resp:  [18] 18 (08/29 0827) BP: (91-119)/(40-46) 119/46 mmHg (08/29 1030) SpO2:  [96 %-100 %] 96 % (08/29 1030) Weight:  [65.182 kg (143 lb 11.2 oz)] 65.182 kg (143 lb 11.2 oz) (08/29 0700) Last BM Date: 12/14/12  The patient looks quite a bit older than her stated age. She has a pale, waxy complexion. She's without jaundice. The abdomen is without overt tenderness. She is coughing intermittently. The abdomen is soft and nontender, without masses. She is concerned about "mesh protruding through" her abdominal wall but what I feel in that area feels like subcutaneous suture; she has very little subcutaneous fat. There is a dressing over a wound in the left lower quadrant, which I did not take down. Digital exam shows a generous amount of firm, but not hard, brown stool in the rectal ampulla, Hemoccult negative when sent to the lab for testing.  Intake/Output from previous day: 08/28 0701 - 08/29 0700 In: 1226 [P.O.:820; I.V.:6; IV  Piggyback:400] Out: 800 [Urine:800] Intake/Output this shift: Total I/O In: 290 [P.O.:240; IV Piggyback:50] Out: 900 [Urine:900]  Lab Results:  Recent Labs  12/14/12 0550 12/15/12 0510 12/16/12 0540  WBC 10.0 6.0 6.9  HGB 8.8* 8.5* 8.2*  HCT 27.1* 26.3* 25.8*  PLT 239 251 270   BMET  Recent Labs  12/14/12 0550 12/15/12 0510 12/16/12 0540  NA 135 136 135  K 5.2* 4.4 3.6  CL 98 98 97  CO2 29 31 29   GLUCOSE 113* 107* 95  BUN 18 14 10   CREATININE 0.71 0.62 0.55  CALCIUM 9.2 9.2 8.8   LFT No results found for this basename: PROT, ALBUMIN, AST, ALT, ALKPHOS, BILITOT, BILIDIR, IBILI,  in the last 72 hours PT/INR No results found for this basename: LABPROT, INR,  in the last 72 hours   Studies/Results: No results found.  Impression: 1. Acute on chronic anemia. History of chronic anemia, with  roughly 2 g drop in hemoglobin since admission. 2. Mixed picture on iron studies; low iron saturation, elevated ferritin 3. Severe medical compromise by COPD and decreased mobility from lumbar spine fracture 4. Chronic anticoagulation as outpatient, with Hemoccult negative stool currently  Discussion: It appears that this patient does not have active or subacute bleeding to account for her drop in hemoglobin since admission, because her stool is Hemoccult negative and there has been no history of melena. Her chronic anemia may be related to iron deficiency, but with the elevated ferritin level (which admittedly could be an acute phase reactant), I think "the anemia of chronic disease" is a more likely possibility. If she is truly iron deficient, it might be a result of iron malabsorption from her chronic PPI therapy, rather than GI tract blood loss.   Plan: Because this patient is a very poor candidate for GI tract evaluation, because of her severe medical problems, I would favor observation. Specifically, I do not feel she needs either endoscopy or colonoscopy.  In my opinion, it  is okay from the GI tract standpoint to resume anticoagulation.  It would be reasonable to recheck a ferritin level as an outpatient once the patient's acute infectious/inflammatory condition (pneumonia) has had a chance to clear out. If she has a low ferritin level, I would favor iron supplementation.  Please call us if we can be of further assistance with this patient.   LOS: 8 days   Chalet Kerwin V  12/16/2012, 11:27 AM

## 2012-12-16 NOTE — Progress Notes (Signed)
Pt has unusually growth to left abdominal fold. The growth is red, malodorous (yeast smell), yellowish-green discharge around it. Area cleansed and covered with Meplix . Please evaluate during rounds.

## 2012-12-16 NOTE — Progress Notes (Signed)
Daily progress note   Aimee Sanders RUE:[id number] DOB: 1946/03/17 DOA: 12/08/2012 PCP: Rudi Heap, MD  Brief narrative: 67 yr old former smoker till 5/14, known Isch CM and CHF on chronic lasix admitted with multifactorial dyspnea.  This was thought to be 2/2 to AECHF>>AECOPD Lasix seemed to make improvement more so than Breathing Rx.  She was noted to be somewhat hypotensive and her chronic cardiac meds were adjusted to allow for diuresis.  She started to complain of increasing LBP 8/24 and it was noted she had sustained a fall with acute fracture of vertebrae about a month prior at [facility].  PT/Ot were consulted and records were saught from prior facility to help with LBP Over course of stay on 8/20 6 AM patient about fever and chills and was found to have new healthcare associated vs. aspiration pneumonia for which antibiotics were started Please see below for full hospital course   Assessment/Plan: 1. Pneumonia-healthcare associated vs. aspiration-chest x-ray 8/26 confirms pneumonia , has leukocytosis 14 K.      -improving on on antibiotic including vancomycin/cefepime  -cont atx monitor; possible change to PO in AM 1. Acute/subacute lumbar fracture L2 through 4-have asked interventional radiology to assess patient-appreciate input in advance-Trial Fexeril for spasm. Lumbar corset ordered- unclear circumstance how fell -no abuse, SW investigated the case with the family;  -cont pain control; monitor, no new neurological signs; ambulating   2. Likely decompensated CHF -now resolved> Acute Exacerbation COPD -echo (08/2012):ejection fraction was 45%.Findings consistent with left ventricular diastolic dysfunction. -diuresed with PO lasix 60 bid->40 mg 8/25 Metoprolol 25 cut back to 12.5 daily-given hypotension [which was likely 2/2 to lasix], cut back imdur to 15 daily from 8/23.  Her weights have been anywhere form 58-64 kg over past 6 mo.  She was -1.4 liters overnight, is -8.6  liters overall for hospital visit.  Weight 65-->61 kg.  noncompliant with low-salt diet. -lasix on hold due to low BP; reevaluate in AM to resume PO lasix, recheck BMP 3. Hypokalemia-likely 2/2 to diuresis-replacing orally K 40 bid; now hyperK; recheck bmp in AM  4. Chronic O2 dependant COPD-stable, not acute-uses chronic home O2 no need for coverage with Abx -continue albuterol 2.5 mg nebs tid, qhs metasone-formoterol bid, Monteleukast 10 daily 5. H/o DVT-continue Xarelto 20 qp; plan to hold xarelto due to anemia  6. Depression-continue Citalopram 20 daily 7. Neuropathy-continue gabapentin 300 tid and 600 8. Hld-cont Fibrate 160 daily 9. Anemia progressive; no s/sof acute bleeding; but H/H dropped;  -irone deficiency ? GI bleed with xarelto; d/w with patient, her son at length risk and benefits of anticoagulation, bleeding; they requested to d/c xarelto;  -c/s d/w GI/endoscopy may be needed; recheck CBC in AM; stool occult blood test; TF prn -start IV iron; PO when d/c home    Past medical history-As per Problem list Chart reviewed as below- Admission 08/31/12 COPD exacerbation + AECHF Admission 07/26/12 c Strep PNA c AECOPD Admission 06/22/12 C Unstable angina-stress showed possible septal ischemia, stable copd, CHF Admission 03/01/12 AECOPD, compensated CHF Admission Acute Resp failure-[place]uth 2/2 to COPD Admission 01/12/12 Acute resp failure, likely multifactorial Admission 11/23/11 AECOPD Admission 5/25/13AECOPD-needed BIPAP and Rx by CCM Admission 07/08/11 A/Chronic resp failure  Consultants:  none  Procedures:  non  Antibiotics:  none   Subjective  Feels terrible. Laying in bed. S1 laying next her. No nausea vomiting no chest pain. Lower extremities are not swollen Tolerating diet.   Objective    Interim History: nad  Telemetry: NSR c PVC's  Objective: Filed Vitals:   12/15/12 2047 12/15/12 2100 12/16/12 0700 12/16/12 0827  BP: 108/46  91/40   Pulse: 84  78 81   Temp: 98.5 F (36.9 C)  98.6 F (37 C)   TempSrc: Oral  Oral   Resp: 18  18 18   Height:      Weight:   65.182 kg (143 lb 11.2 oz)   SpO2: 98% 98% 98% 98%    Intake/Output Summary (Last 24 hours) at 12/16/12 0948 Last data filed at 12/15/12 1853  Gross per 24 hour  Intake    486 ml  Output    800 ml  Net   -314 ml    Exam:  General: eomi, NCAT Cardiovascular: s1 s2 no m/r/g Respiratory: clear Abdomen: soft, NT, ND-has a swelling with irreg margins that is oozing on abdomen Skin 1 +Le edema Neurointact-SLR neg, Hip internal and external rotation neg.  Some spasm over Lower back but poor exam   Data Reviewed: Basic Metabolic Panel:  Recent Labs Lab 12/12/12 1152 12/14/12 0550 12/15/12 0510 12/16/12 0540  NA 137 135 136 135  K 2.8* 5.2* 4.4 3.6  CL 89* 98 98 97  CO2 38* 29 31 29   GLUCOSE 182* 113* 107* 95  BUN 16 18 14 10   CREATININE 0.74 0.71 0.62 0.55  CALCIUM 9.3 9.2 9.2 8.8   Liver Function Tests: No results found for this basename: AST, ALT, ALKPHOS, BILITOT, PROT, ALBUMIN,  in the last 168 hours No results found for this basename: LIPASE, AMYLASE,  in the last 168 hours No results found for this basename: AMMONIA,  in the last 168 hours CBC:  Recent Labs Lab 12/11/12 0440 12/13/12 0640 12/14/12 0550 12/15/12 0510 12/16/12 0540  WBC 7.1 14.4* 10.0 6.0 6.9  NEUTROABS  --  10.5*  --   --   --   HGB 9.6* 11.0* 8.8* 8.5* 8.2*  HCT 30.1* 32.8* 27.1* 26.3* 25.8*  MCV 93.5 91.1 91.9 92.9 92.5  PLT 272 268 239 251 270   Cardiac Enzymes: No results found for this basename: CKTOTAL, CKMB, CKMBINDEX, TROPONINI,  in the last 168 hours BNP: No components found with this basename: POCBNP,  CBG: No results found for this basename: GLUCAP,  in the last 168 hours  Recent Results (from the past 240 hour(s))  MRSA PCR SCREENING     Status: None   Collection Time    12/08/12 11:21 PM      Result Value Range Status   MRSA by PCR NEGATIVE  NEGATIVE Final    Comment:            The GeneXpert MRSA Assay (FDA     approved for NASAL specimens     only), is one component of a     comprehensive MRSA colonization     surveillance program. It is not     intended to diagnose MRSA     infection nor to guide or     monitor treatment for     MRSA infections.  CULTURE, BLOOD (ROUTINE X 2)     Status: None   Collection Time    12/13/12  6:40 AM      Result Value Range Status   Specimen Description BLOOD RIGHT ARM   Final   Special Requests BOTTLES DRAWN AEROBIC AND ANAEROBIC 10CC EA   Final   Culture  Setup Time     Final   Value: 12/13/2012 11:21  Performed at [facility]     Final   Value:        BLOOD CULTURE RECEIVED NO GROWTH TO DATE CULTURE WILL BE HELD FOR 5 DAYS BEFORE ISSUING A FINAL NEGATIVE REPORT     Performed at [facility]   Report Status PENDING   Incomplete  CULTURE, BLOOD (ROUTINE X 2)     Status: None   Collection Time    12/13/12  6:45 AM      Result Value Range Status   Specimen Description BLOOD RIGHT HAND   Final   Special Requests BOTTLES DRAWN AEROBIC ONLY 6CC   Final   Culture  Setup Time     Final   Value: 12/13/2012 11:21     Performed at [facility]   Culture     Final   Value:        BLOOD CULTURE RECEIVED NO GROWTH TO DATE CULTURE WILL BE HELD FOR 5 DAYS BEFORE ISSUING A FINAL NEGATIVE REPORT     Performed at [facility]   Report Status PENDING   Incomplete     Studies:              All Imaging reviewed and is as per above notation   Scheduled Meds: . albuterol  2.5 mg Nebulization TID   And  . ipratropium  0.5 mg Nebulization TID  . ceFEPime (MAXIPIME) IV  1 g Intravenous Q8H  . citalopram  20 mg Oral Daily  . docusate sodium  100 mg Oral BID  . fenofibrate  160 mg Oral Q supper  . gabapentin  300 mg Oral TID AC   And  . gabapentin  600 mg Oral QHS  . isosorbide mononitrate  15 mg Oral Daily  . metoprolol tartrate  12.5 mg Oral Daily  .  mometasone-formoterol  2 puff Inhalation BID  . montelukast  10 mg Oral Daily  . nicotine  7 mg Transdermal Daily  . nystatin  500,000 Units Oral QID  . pantoprazole  40 mg Oral Daily  . polyethylene glycol  17 g Oral Daily  . Rivaroxaban  20 mg Oral Q supper  . senna  1 tablet Oral Daily  . sodium chloride  3 mL Intravenous Q12H  . sodium chloride  3 mL Intravenous Q12H  . vancomycin  1,250 mg Intravenous Q24H   Continuous Infusions:   10.   Code Status: Full Family Communication: spoke with son at bedside  Disposition Plan: Braulio Bosch   [facility] Pager [phone]  12/16/2012, 9:48 AM    LOS: 8 days

## 2012-12-16 NOTE — Progress Notes (Signed)
Physical Therapy Treatment Patient Details Name: Aimee Sanders MRN: [id number] DOB: August 08, 1945 Today's Date: 12/16/2012 Time: 7829-5621 PT Time Calculation (min): 25 min  PT Assessment / Plan / Recommendation  History of Present Illness 67 y.o. female with history of CHF(systolic and diastolic), COPD on home oxygen, history of DVT on xarelto presents with complaints of increasing weight gain and lower extremity edema with difficulty breathing. Patient has been having these symptoms for last 2-3 days. In the ER patient had chest x-ray which showed nothing acute but given her clinical presentation patient was given Lasix for possible CHF exacerbation and has been in bed for the same. Patient denies any chest pain productive cough fever chills nausea vomiting abdominal pain diarrhea.   PT Comments   Pt cont's to be limited by pain, fatigue, & weakness.  Pt still needs to practice steps before d/cing home.    Follow Up Recommendations  No PT follow up;Supervision - Intermittent     Does the patient have the potential to tolerate intense rehabilitation     Barriers to Discharge        Equipment Recommendations  None recommended by PT    Recommendations for Other Services    Frequency Min 3X/week   Progress towards PT Goals    Plan Current plan remains appropriate    Precautions / Restrictions Precautions Precautions: Fall Restrictions Weight Bearing Restrictions: No   Pertinent Vitals/Pain 9/10 back pain.  RN states pt not due for medication at this time    Mobility  Bed Mobility Bed Mobility: Rolling Left;Left Sidelying to Sit Rolling Left: 5: Supervision;With rail Left Sidelying to Sit: 3: Mod assist;With rails Details for Bed Mobility Assistance: Incr time & (A) to lift shoulders/trunk to sitting upright due to back pain.   Transfers Transfers: Sit to Stand;Stand to Sit Sit to Stand: 4: Min guard;With upper extremity assist;From bed Stand to Sit: 4: Min guard;With upper  extremity assist;With armrests;To chair/3-in-1 Details for Transfer Assistance: Incr time due to back pain Ambulation/Gait Ambulation/Gait Assistance: 4: Min guard Ambulation Distance (Feet): 80 Feet Assistive device: Rolling walker Ambulation/Gait Assistance Details: Distance limited due to back pain & fatigue.  Slow guarded gait Gait Pattern: Step-through pattern;Decreased stride length Gait velocity: slow Stairs: No Wheelchair Mobility Wheelchair Mobility: No      PT Goals (current goals can now be found in the care plan section) Acute Rehab PT Goals PT Goal Formulation: With patient Time For Goal Achievement: 12/19/12 Potential to Achieve Goals: Good  Visit Information  Last PT Received On: 12/16/12 Assistance Needed: +1 History of Present Illness: 67 y.o. female with history of CHF(systolic and diastolic), COPD on home oxygen, history of DVT on xarelto presents with complaints of increasing weight gain and lower extremity edema with difficulty breathing. Patient has been having these symptoms for last 2-3 days. In the ER patient had chest x-ray which showed nothing acute but given her clinical presentation patient was given Lasix for possible CHF exacerbation and has been in bed for the same. Patient denies any chest pain productive cough fever chills nausea vomiting abdominal pain diarrhea.    Subjective Data      Cognition  Cognition Arousal/Alertness: Awake/alert Behavior During Therapy: WFL for tasks assessed/performed Overall Cognitive Status: Within Functional Limits for tasks assessed    Balance     End of Session PT - End of Session Equipment Utilized During Treatment: Gait belt;Back brace Activity Tolerance: Patient limited by fatigue;Patient limited by pain Patient left: in chair;with call bell/phone  within reach Nurse Communication: Mobility status   GP     Lara Mulch 12/16/2012, 10:43 AM  Verdell Face, PTA [phone] 12/16/2012

## 2012-12-16 NOTE — Progress Notes (Signed)
ANTIBIOTIC CONSULT NOTE - FOLLOW UP  Pharmacy Consult for Vancomycin, Cefepime Indication: pneumonia  Allergies  Allergen Reactions  . Aspirin Other (See Comments)    Patient states no reaction, only told not to take while on other blood thinner  . Levofloxacin Hives  . Other Itching and Rash  . Penicillins Hives and Itching    Patient Measurements: Height: 5\' 3"  (160 cm) Weight: 143 lb 11.2 oz (65.182 kg) (scale B) IBW/kg (Calculated) : 52.4  Vital Signs: Temp: 98.6 F (37 C) (08/29 0700) Temp src: Oral (08/29 0700) BP: 91/40 mmHg (08/29 0700) Pulse Rate: 78 (08/29 0700) Intake/Output from previous day: 08/28 0701 - 08/29 0700 In: 826 [P.O.:820; I.V.:6] Out: 800 [Urine:800] Intake/Output from this shift:    Labs:  Recent Labs  12/14/12 0550 12/15/12 0510 12/16/12 0540  WBC 10.0 6.0 6.9  HGB 8.8* 8.5* 8.2*  PLT 239 251 270  CREATININE 0.71 0.62 0.55   Estimated Creatinine Clearance: 61.1 ml/min (by C-G formula based on Cr of 0.55). No results found for this basename: VANCOTROUGH, Leodis Binet, VANCORANDOM, GENTTROUGH, GENTPEAK, GENTRANDOM, TOBRATROUGH, TOBRAPEAK, TOBRARND, AMIKACINPEAK, AMIKACINTROU, AMIKACIN,  in the last 72 hours   Microbiology:   Anti-infectives   Start     Dose/Rate Route Frequency Ordered Stop   12/14/12 0600  vancomycin (VANCOCIN) 1,250 mg in sodium chloride 0.9 % 250 mL IVPB     1,250 mg 166.7 mL/hr over 90 Minutes Intravenous Every 24 hours 12/13/12 0756     12/13/12 0900  vancomycin (VANCOCIN) 1,250 mg in sodium chloride 0.9 % 250 mL IVPB     1,250 mg 166.7 mL/hr over 90 Minutes Intravenous  Once 12/13/12 0756 12/13/12 1228   12/13/12 0800  ceFEPIme (MAXIPIME) 1 g in dextrose 5 % 50 mL IVPB     1 g 100 mL/hr over 30 Minutes Intravenous Every 8 hours 12/13/12 0739 12/21/12 0759     67 yo F admitted 12/08/2012  with SOB, LE edema. Pharmacy consulted to dose vanc and cefepime.  PMH: CHF, DVT, HLD, OSA, COPD, CAD, chronic pain, PVD,  CVA, DDD, depression, anxiety, OA  Events: tansfer to floor, growth in abd folds noted by rn,   AC: hx DVT; Xarelto per MD  - H/H low, slow trend down, no bleeding noted  ID:  HCAP vs asp PNA - Afebrile, WBC WNL, CXR - new LL opacity susp for PNA - in the past 750mg  Q12H resulted in a slightly high trough and 500mg  Q12H resulted in a slightly low trough  Vanc 8/26>>   (68d, 9/3) Cefepime 8/26 >>  8/26 BCx2 - ngtd  CV: CHF (EF 45%, diastolic dysfunction, decompensation resolved), HLD, CAD, PVD - BP Low 90s HR 70s - fenofibrate, imdur, metoprolol  GI/Nutr: Cardiac diet - PPI  Endo: No hx - AM gluc ok  Neuro/MSK: Hx anxiety, depression, CVA, chronic pain - L2-4 fx, lumbar corset on order - celexa, gabapentin  Renal: CrCl ~60, lytes ok  Pulm: Hx COPD, OSA - 98% 2L [place] - duonebs, dulera  Plan: 1. Continue cefepime 1gm IV Q8H 2. Continue vanc 1250mg  IV Q24H based on previous dosing ; check steady state trough with AM 8/30 3. Continue xarelto 20mg  daily    Thank you for allowing pharmacy to be a part of this patients care team.  Lovenia Kim Pharm.D., BCPS Clinical Pharmacist 12/16/2012 8:22 AM Pager: [phone] Phone: [phone]

## 2012-12-17 LAB — CBC
HCT: 25.9 % — ABNORMAL LOW (ref 36.0–46.0)
Hemoglobin: 8.3 g/dL — ABNORMAL LOW (ref 12.0–15.0)
MCV: 92.5 fL (ref 78.0–100.0)
RDW: 14.3 % (ref 11.5–15.5)
WBC: 6.3 10*3/uL (ref 4.0–10.5)

## 2012-12-17 MED ORDER — SENNA 8.6 MG PO TABS: 1.0000 | ORAL_TABLET | Freq: Every day | ORAL | Status: DC

## 2012-12-17 MED ORDER — CYCLOBENZAPRINE HCL 5 MG PO TABS: 5.0000 mg | ORAL_TABLET | Freq: Three times a day (TID) | ORAL | Status: DC | PRN

## 2012-12-17 MED ORDER — FERROUS SULFATE 325 (65 FE) MG PO TABS: 325.0000 mg | ORAL_TABLET | Freq: Three times a day (TID) | ORAL | Status: DC

## 2012-12-17 MED ORDER — ALBUTEROL SULFATE HFA 108 (90 BASE) MCG/ACT IN AERS: 2.0000 | INHALATION_SPRAY | Freq: Four times a day (QID) | RESPIRATORY_TRACT | Status: AC | PRN

## 2012-12-17 MED ORDER — ISOSORBIDE MONONITRATE ER 30 MG PO TB24: 15.0000 mg | ORAL_TABLET | Freq: Every day | ORAL | Status: DC

## 2012-12-17 MED ORDER — OXYCODONE-ACETAMINOPHEN 10-325 MG PO TABS: 1.0000 | ORAL_TABLET | Freq: Three times a day (TID) | ORAL | Status: DC | PRN

## 2012-12-17 MED ORDER — FLUTICASONE-SALMETEROL 250-50 MCG/DOSE IN AEPB: 1.0000 | INHALATION_SPRAY | Freq: Two times a day (BID) | RESPIRATORY_TRACT | Status: DC

## 2012-12-17 MED ORDER — AMOXICILLIN-POT CLAVULANATE 875-125 MG PO TABS: 1.0000 | ORAL_TABLET | Freq: Two times a day (BID) | ORAL | Status: DC

## 2012-12-17 MED ORDER — DSS 100 MG PO CAPS: 100.0000 mg | ORAL_CAPSULE | Freq: Two times a day (BID) | ORAL | Status: DC

## 2012-12-17 MED ORDER — METOPROLOL TARTRATE 25 MG PO TABS: 12.5000 mg | ORAL_TABLET | Freq: Every day | ORAL | Status: AC

## 2012-12-17 MED ORDER — ALBUTEROL SULFATE (2.5 MG/3ML) 0.083% IN NEBU: 2.5000 mg | INHALATION_SOLUTION | Freq: Four times a day (QID) | RESPIRATORY_TRACT | Status: DC | PRN

## 2012-12-17 MED ORDER — FUROSEMIDE 40 MG PO TABS: 40.0000 mg | ORAL_TABLET | Freq: Every day | ORAL | Status: DC

## 2012-12-17 MED ORDER — POLYETHYLENE GLYCOL 3350 17 G PO PACK: 17.0000 g | PACK | Freq: Every day | ORAL | Status: DC
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
## 2012-12-19 LAB — CULTURE, BLOOD (ROUTINE X 2)
Culture: NO GROWTH
Culture: NO GROWTH

## 2012-12-23 ENCOUNTER — Telehealth: Payer: Self-pay | Admitting: *Deleted

## 2012-12-23 NOTE — Telephone Encounter (Signed)
Angie with [facility] states that pt. Has gained 8 lbs. Since wed. LE has 4+ edema. Daughter is giving lasix 40 mg. Tid please advise asap nurses number is [phone]

## 2012-12-23 NOTE — Telephone Encounter (Signed)
Spoke with Angie and informed patient should be transported to emergency room for further evaluation. Angie verbalized understanding and will inform patient and family.

## 2012-12-29 ENCOUNTER — Telehealth: Payer: Self-pay

## 2012-12-29 NOTE — Telephone Encounter (Signed)
Only recent labs were done at hospital

## 2012-12-29 NOTE — Telephone Encounter (Signed)
Never heard about lab results

## 2013-01-03 ENCOUNTER — Other Ambulatory Visit: Payer: Self-pay | Admitting: Nurse Practitioner

## 2013-01-04 ENCOUNTER — Other Ambulatory Visit: Payer: Self-pay | Admitting: *Deleted

## 2013-01-04 DIAGNOSIS — G8929 Other chronic pain: Secondary | ICD-10-CM

## 2013-01-04 NOTE — Telephone Encounter (Signed)
Cannot refill xanax until the 21

## 2013-01-04 NOTE — Telephone Encounter (Signed)
LAST RF 12/07/12. LAST OV 11/17/12. PRINT AND CALL PT. THANKS.

## 2013-01-04 NOTE — Telephone Encounter (Signed)
LAST RF 12/08/12. LAST OV 11/17/12. CAN TELL NOT TO FILL UNTIL 12/08/12. CALL IN [facility]

## 2013-01-04 NOTE — Telephone Encounter (Signed)
To early for refill  

## 2013-01-06 ENCOUNTER — Telehealth: Payer: Self-pay | Admitting: Nurse Practitioner

## 2013-01-06 DIAGNOSIS — G8929 Other chronic pain: Secondary | ICD-10-CM

## 2013-01-09 ENCOUNTER — Other Ambulatory Visit: Payer: Self-pay | Admitting: Nurse Practitioner

## 2013-01-09 NOTE — Telephone Encounter (Signed)
States that it was filled on 8/20 and they will bring the bottle in for you to see

## 2013-01-09 NOTE — Telephone Encounter (Signed)
Last filled on 12/17/12- cannot fill till 01/17/13

## 2013-01-09 NOTE — Telephone Encounter (Signed)
Was filled by another dr. On 12/17/12

## 2013-01-10 ENCOUNTER — Other Ambulatory Visit: Payer: Self-pay | Admitting: Nurse Practitioner

## 2013-01-10 MED ORDER — ALPRAZOLAM 0.5 MG PO TABS: 0.5000 mg | ORAL_TABLET | Freq: Four times a day (QID) | ORAL | Status: DC | PRN

## 2013-01-10 MED ORDER — OXYCODONE-ACETAMINOPHEN 10-325 MG PO TABS: 1.0000 | ORAL_TABLET | Freq: Three times a day (TID) | ORAL | Status: DC | PRN

## 2013-01-10 NOTE — Telephone Encounter (Signed)
Talked with family and was told that they attempted to get pain meds filled from someone else and that will not be allowed- Pateint family was told NOT to let this happen again.

## 2013-01-10 NOTE — Telephone Encounter (Signed)
Per narcotic registry rx that was done on 12-17-12 by Dr York Spaniel at [facility] was cancelled by the ordering Dr. Per Ozzie Hoyle patients family brought rx for Percocet #90 in to store on 12-24-12 written by Dr York Spaniel to get a price on how much it would cost to fill. Patients family did not get it filled and rx was returned to patient per pharmacy. Per [facility] on 12-30-12 patients family presented rx written by Dr Christell Constant for percocet to get filled. Rx had been cancelled in EPIC so rx could not be filled. Last got Percocet on 12-07-12 [facility] [place] by [facility]. Patient now requesting a refill on Percocet. Please advise

## 2013-01-11 ENCOUNTER — Encounter (INDEPENDENT_AMBULATORY_CARE_PROVIDER_SITE_OTHER): Payer: PRIVATE HEALTH INSURANCE | Admitting: Surgery

## 2013-01-11 NOTE — Telephone Encounter (Signed)
Already addressed

## 2013-01-16 ENCOUNTER — Other Ambulatory Visit: Payer: Self-pay | Admitting: Nurse Practitioner

## 2013-01-17 ENCOUNTER — Encounter (INDEPENDENT_AMBULATORY_CARE_PROVIDER_SITE_OTHER): Payer: Self-pay

## 2013-01-17 ENCOUNTER — Other Ambulatory Visit: Payer: Self-pay

## 2013-01-17 DIAGNOSIS — J449 Chronic obstructive pulmonary disease, unspecified: Secondary | ICD-10-CM

## 2013-01-17 DIAGNOSIS — I5042 Chronic combined systolic (congestive) and diastolic (congestive) heart failure: Secondary | ICD-10-CM

## 2013-01-17 DIAGNOSIS — IMO0002 Reserved for concepts with insufficient information to code with codable children: Secondary | ICD-10-CM

## 2013-01-17 DIAGNOSIS — I509 Heart failure, unspecified: Secondary | ICD-10-CM

## 2013-01-17 MED ORDER — CITALOPRAM HYDROBROMIDE 20 MG PO TABS: 20.0000 mg | ORAL_TABLET | Freq: Every day | ORAL | Status: DC

## 2013-01-17 MED ORDER — MONTELUKAST SODIUM 10 MG PO TABS: 10.0000 mg | ORAL_TABLET | Freq: Every day | ORAL | Status: DC

## 2013-01-17 MED ORDER — FOLIC ACID 1 MG PO TABS: 1.0000 mg | ORAL_TABLET | Freq: Every day | ORAL | Status: DC

## 2013-01-18 ENCOUNTER — Ambulatory Visit: Payer: Medicare Other | Admitting: Nurse Practitioner

## 2013-01-20 ENCOUNTER — Other Ambulatory Visit: Payer: Self-pay | Admitting: *Deleted

## 2013-01-20 MED ORDER — RIVAROXABAN 20 MG PO TABS: 20.0000 mg | ORAL_TABLET | Freq: Every day | ORAL | Status: DC

## 2013-01-20 NOTE — Telephone Encounter (Signed)
Not sure

## 2013-01-24 ENCOUNTER — Telehealth: Payer: Self-pay | Admitting: Nurse Practitioner

## 2013-01-24 NOTE — Telephone Encounter (Signed)
Nothing can do fro eye watering other than OTC allergy eye dropps

## 2013-01-24 NOTE — Telephone Encounter (Signed)
Please advise 

## 2013-01-25 NOTE — Telephone Encounter (Signed)
I can't just call something in not knowing what it looks like- Can they take her to eye dr.

## 2013-01-25 NOTE — Telephone Encounter (Signed)
Patient said that her eye is infected and wants med called in i told her she needed to go to an eye doctor to get it checked out because it could be serious

## 2013-01-30 ENCOUNTER — Encounter (INDEPENDENT_AMBULATORY_CARE_PROVIDER_SITE_OTHER): Payer: PRIVATE HEALTH INSURANCE | Admitting: Surgery

## 2013-02-07 ENCOUNTER — Encounter (INDEPENDENT_AMBULATORY_CARE_PROVIDER_SITE_OTHER): Payer: PRIVATE HEALTH INSURANCE | Admitting: Surgery

## 2013-02-07 ENCOUNTER — Telehealth: Payer: Self-pay | Admitting: Nurse Practitioner

## 2013-02-07 ENCOUNTER — Other Ambulatory Visit: Payer: Self-pay | Admitting: Nurse Practitioner

## 2013-02-07 DIAGNOSIS — G8929 Other chronic pain: Secondary | ICD-10-CM

## 2013-02-07 MED ORDER — OXYCODONE-ACETAMINOPHEN 10-325 MG PO TABS: 1.0000 | ORAL_TABLET | Freq: Three times a day (TID) | ORAL | Status: DC | PRN

## 2013-02-07 NOTE — Telephone Encounter (Signed)
Last filled 01/10/13, last seen 11/17/12, will print

## 2013-02-07 NOTE — Telephone Encounter (Signed)
rx ready for pickup 

## 2013-02-08 ENCOUNTER — Encounter (INDEPENDENT_AMBULATORY_CARE_PROVIDER_SITE_OTHER): Payer: PRIVATE HEALTH INSURANCE | Admitting: Surgery

## 2013-02-09 ENCOUNTER — Encounter (INDEPENDENT_AMBULATORY_CARE_PROVIDER_SITE_OTHER): Payer: Self-pay | Admitting: Surgery

## 2013-02-09 NOTE — Telephone Encounter (Signed)
Rx up front ready for pick up

## 2013-02-20 ENCOUNTER — Other Ambulatory Visit: Payer: Self-pay | Admitting: Nurse Practitioner

## 2013-02-20 MED ORDER — TOBRAMYCIN-DEXAMETHASONE 0.3-0.1 % OP SUSP: 1.0000 [drp] | OPHTHALMIC | Status: AC

## 2013-02-27 ENCOUNTER — Telehealth: Payer: Self-pay | Admitting: Nurse Practitioner

## 2013-02-28 ENCOUNTER — Other Ambulatory Visit: Payer: Self-pay | Admitting: Nurse Practitioner

## 2013-02-28 MED ORDER — ONDANSETRON HCL 4 MG PO TABS: 4.0000 mg | ORAL_TABLET | Freq: Four times a day (QID) | ORAL | Status: DC | PRN

## 2013-02-28 NOTE — Telephone Encounter (Signed)
To early for pain meds- sent phernergan rx to pharmacy

## 2013-02-28 NOTE — Telephone Encounter (Signed)
Actually sen tin zofran instead of phenergan

## 2013-02-28 NOTE — Telephone Encounter (Signed)
Wants something called in

## 2013-02-28 NOTE — Telephone Encounter (Signed)
Patient aware rx sent to pharmacy.  

## 2013-03-02 ENCOUNTER — Telehealth: Payer: Self-pay | Admitting: Nurse Practitioner

## 2013-03-02 DIAGNOSIS — R1904 Left lower quadrant abdominal swelling, mass and lump: Secondary | ICD-10-CM

## 2013-03-02 NOTE — Telephone Encounter (Signed)
Patient aware would like to go somewhere near [place]

## 2013-03-02 NOTE — Telephone Encounter (Signed)
referrral made 

## 2013-03-02 NOTE — Telephone Encounter (Signed)
zofran is the strongest nausea medicine- nothing else to give- derm is not going to do anything about place on abdomen- needs surgeon referral- anyone in particular want to see?

## 2013-03-06 ENCOUNTER — Telehealth: Payer: Self-pay | Admitting: Nurse Practitioner

## 2013-03-06 ENCOUNTER — Other Ambulatory Visit: Payer: Self-pay | Admitting: *Deleted

## 2013-03-06 DIAGNOSIS — G8929 Other chronic pain: Secondary | ICD-10-CM

## 2013-03-06 MED ORDER — OMEPRAZOLE 20 MG PO CPDR: 20.0000 mg | DELAYED_RELEASE_CAPSULE | Freq: Every day | ORAL | Status: AC

## 2013-03-06 MED ORDER — MONTELUKAST SODIUM 10 MG PO TABS: 10.0000 mg | ORAL_TABLET | Freq: Every day | ORAL | Status: AC

## 2013-03-06 MED ORDER — CITALOPRAM HYDROBROMIDE 20 MG PO TABS: 20.0000 mg | ORAL_TABLET | Freq: Every day | ORAL | Status: AC

## 2013-03-06 MED ORDER — FLUTICASONE-SALMETEROL 250-50 MCG/DOSE IN AEPB: 1.0000 | INHALATION_SPRAY | Freq: Two times a day (BID) | RESPIRATORY_TRACT | Status: DC

## 2013-03-06 MED ORDER — OXYCODONE-ACETAMINOPHEN 10-325 MG PO TABS: 1.0000 | ORAL_TABLET | Freq: Three times a day (TID) | ORAL | Status: DC | PRN

## 2013-03-06 MED ORDER — FOLIC ACID 1 MG PO TABS: 1.0000 mg | ORAL_TABLET | Freq: Every day | ORAL | Status: AC

## 2013-03-06 MED ORDER — ALPRAZOLAM 0.5 MG PO TABS: 0.5000 mg | ORAL_TABLET | Freq: Four times a day (QID) | ORAL | Status: DC

## 2013-03-06 NOTE — Telephone Encounter (Signed)
rx ready for pickup 

## 2013-03-07 NOTE — Telephone Encounter (Signed)
Up front patient aware 

## 2013-03-09 ENCOUNTER — Other Ambulatory Visit: Payer: Self-pay | Admitting: Family Medicine

## 2013-03-13 ENCOUNTER — Other Ambulatory Visit: Payer: Self-pay | Admitting: Nurse Practitioner

## 2013-03-13 ENCOUNTER — Other Ambulatory Visit: Payer: Self-pay | Admitting: *Deleted

## 2013-03-13 MED ORDER — ISOSORBIDE MONONITRATE ER 30 MG PO TB24: 30.0000 mg | ORAL_TABLET | Freq: Every day | ORAL | Status: DC

## 2013-03-14 ENCOUNTER — Other Ambulatory Visit: Payer: Self-pay | Admitting: General Practice

## 2013-03-14 NOTE — Telephone Encounter (Signed)
Last seen 11/17/12  Aimee Sanders   

## 2013-03-15 ENCOUNTER — Telehealth: Payer: Self-pay | Admitting: Nurse Practitioner

## 2013-03-15 ENCOUNTER — Other Ambulatory Visit: Payer: Self-pay | Admitting: General Practice

## 2013-03-15 ENCOUNTER — Ambulatory Visit (INDEPENDENT_AMBULATORY_CARE_PROVIDER_SITE_OTHER): Payer: PRIVATE HEALTH INSURANCE | Admitting: Surgery

## 2013-03-15 NOTE — Telephone Encounter (Signed)
ntbs

## 2013-03-15 NOTE — Telephone Encounter (Signed)
Please ask if she has been seen by provider for on going nausea?

## 2013-03-17 NOTE — Telephone Encounter (Signed)
Patient aware and will schedule an appt.  

## 2013-03-17 NOTE — Telephone Encounter (Signed)
Patient has been getting it from [facility]

## 2013-03-18 ENCOUNTER — Other Ambulatory Visit: Payer: Self-pay | Admitting: Nurse Practitioner

## 2013-03-21 ENCOUNTER — Telehealth: Payer: Self-pay | Admitting: Nurse Practitioner

## 2013-03-22 NOTE — Telephone Encounter (Signed)
NTBS if needs antibiotic

## 2013-03-28 ENCOUNTER — Other Ambulatory Visit: Payer: Self-pay | Admitting: Nurse Practitioner

## 2013-03-30 NOTE — Telephone Encounter (Signed)
Family member will inform Ms Scheaffer,NTBS for her sickness and no antibiotic will be called in.

## 2013-03-31 NOTE — Telephone Encounter (Signed)
Last seen 11/17/12  Mae   

## 2013-04-03 ENCOUNTER — Other Ambulatory Visit: Payer: Self-pay

## 2013-04-03 NOTE — Telephone Encounter (Signed)
Last seen 11/17/12  Aimee Sanders   

## 2013-04-05 ENCOUNTER — Other Ambulatory Visit: Payer: Self-pay

## 2013-04-05 ENCOUNTER — Telehealth: Payer: Self-pay | Admitting: Nurse Practitioner

## 2013-04-05 DIAGNOSIS — G8929 Other chronic pain: Secondary | ICD-10-CM

## 2013-04-05 MED ORDER — ALPRAZOLAM 0.5 MG PO TABS: 0.5000 mg | ORAL_TABLET | Freq: Four times a day (QID) | ORAL | Status: DC

## 2013-04-05 MED ORDER — OXYCODONE-ACETAMINOPHEN 10-325 MG PO TABS: 1.0000 | ORAL_TABLET | Freq: Three times a day (TID) | ORAL | Status: DC | PRN

## 2013-04-05 NOTE — Telephone Encounter (Signed)
Last seen 11/17/12  Aimee Sanders   

## 2013-04-05 NOTE — Telephone Encounter (Signed)
rx ready for pickup 

## 2013-04-05 NOTE — Telephone Encounter (Signed)
Patient aware rx up front 

## 2013-04-07 ENCOUNTER — Other Ambulatory Visit: Payer: Self-pay | Admitting: *Deleted

## 2013-04-07 ENCOUNTER — Telehealth: Payer: Self-pay | Admitting: Nurse Practitioner

## 2013-04-07 MED ORDER — RIVAROXABAN 20 MG PO TABS: 20.0000 mg | ORAL_TABLET | Freq: Every day | ORAL | Status: AC

## 2013-04-10 ENCOUNTER — Other Ambulatory Visit: Payer: Self-pay | Admitting: Nurse Practitioner

## 2013-04-11 NOTE — Telephone Encounter (Signed)
Last seen 11/17/12  Mae   

## 2013-04-17 ENCOUNTER — Ambulatory Visit (INDEPENDENT_AMBULATORY_CARE_PROVIDER_SITE_OTHER): Payer: PRIVATE HEALTH INSURANCE | Admitting: Surgery

## 2013-04-21 ENCOUNTER — Inpatient Hospital Stay (HOSPITAL_COMMUNITY)
Admission: EM | Admit: 2013-04-21 | Discharge: 2013-04-30 | DRG: 871 | Disposition: A | Discharge status: Home Health | Payer: PRIVATE HEALTH INSURANCE | Attending: Internal Medicine | Admitting: Internal Medicine

## 2013-04-21 ENCOUNTER — Inpatient Hospital Stay (HOSPITAL_COMMUNITY): Payer: PRIVATE HEALTH INSURANCE

## 2013-04-21 ENCOUNTER — Emergency Department (HOSPITAL_COMMUNITY): Payer: PRIVATE HEALTH INSURANCE

## 2013-04-21 ENCOUNTER — Encounter (HOSPITAL_COMMUNITY): Payer: Self-pay | Admitting: Emergency Medicine

## 2013-04-21 DIAGNOSIS — R7989 Other specified abnormal findings of blood chemistry: Secondary | ICD-10-CM

## 2013-04-21 DIAGNOSIS — I739 Peripheral vascular disease, unspecified: Secondary | ICD-10-CM | POA: Diagnosis present

## 2013-04-21 DIAGNOSIS — Z8673 Personal history of transient ischemic attack (TIA), and cerebral infarction without residual deficits: Secondary | ICD-10-CM

## 2013-04-21 DIAGNOSIS — D696 Thrombocytopenia, unspecified: Secondary | ICD-10-CM | POA: Diagnosis present

## 2013-04-21 DIAGNOSIS — Z86718 Personal history of other venous thrombosis and embolism: Secondary | ICD-10-CM

## 2013-04-21 DIAGNOSIS — E785 Hyperlipidemia, unspecified: Secondary | ICD-10-CM | POA: Diagnosis present

## 2013-04-21 DIAGNOSIS — D649 Anemia, unspecified: Secondary | ICD-10-CM | POA: Diagnosis present

## 2013-04-21 DIAGNOSIS — J9620 Acute and chronic respiratory failure, unspecified whether with hypoxia or hypercapnia: Secondary | ICD-10-CM | POA: Diagnosis present

## 2013-04-21 DIAGNOSIS — I472 Ventricular tachycardia, unspecified: Secondary | ICD-10-CM

## 2013-04-21 DIAGNOSIS — I252 Old myocardial infarction: Secondary | ICD-10-CM

## 2013-04-21 DIAGNOSIS — J449 Chronic obstructive pulmonary disease, unspecified: Secondary | ICD-10-CM

## 2013-04-21 DIAGNOSIS — I3139 Other pericardial effusion (noninflammatory): Secondary | ICD-10-CM

## 2013-04-21 DIAGNOSIS — J189 Pneumonia, unspecified organism: Secondary | ICD-10-CM | POA: Diagnosis present

## 2013-04-21 DIAGNOSIS — G8929 Other chronic pain: Secondary | ICD-10-CM | POA: Diagnosis present

## 2013-04-21 DIAGNOSIS — R6520 Severe sepsis without septic shock: Secondary | ICD-10-CM

## 2013-04-21 DIAGNOSIS — R6251 Failure to thrive (child): Secondary | ICD-10-CM

## 2013-04-21 DIAGNOSIS — A498 Other bacterial infections of unspecified site: Secondary | ICD-10-CM | POA: Diagnosis not present

## 2013-04-21 DIAGNOSIS — F17200 Nicotine dependence, unspecified, uncomplicated: Secondary | ICD-10-CM

## 2013-04-21 DIAGNOSIS — E871 Hypo-osmolality and hyponatremia: Secondary | ICD-10-CM

## 2013-04-21 DIAGNOSIS — I509 Heart failure, unspecified: Secondary | ICD-10-CM | POA: Diagnosis present

## 2013-04-21 DIAGNOSIS — F329 Major depressive disorder, single episode, unspecified: Secondary | ICD-10-CM | POA: Diagnosis present

## 2013-04-21 DIAGNOSIS — J154 Pneumonia due to other streptococci: Secondary | ICD-10-CM

## 2013-04-21 DIAGNOSIS — H5500 Unspecified nystagmus: Secondary | ICD-10-CM | POA: Diagnosis present

## 2013-04-21 DIAGNOSIS — Z886 Allergy status to analgesic agent status: Secondary | ICD-10-CM

## 2013-04-21 DIAGNOSIS — A4151 Sepsis due to Escherichia coli [E. coli]: Principal | ICD-10-CM | POA: Diagnosis present

## 2013-04-21 DIAGNOSIS — A419 Sepsis, unspecified organism: Secondary | ICD-10-CM | POA: Diagnosis present

## 2013-04-21 DIAGNOSIS — G4733 Obstructive sleep apnea (adult) (pediatric): Secondary | ICD-10-CM | POA: Diagnosis present

## 2013-04-21 DIAGNOSIS — Z96649 Presence of unspecified artificial hip joint: Secondary | ICD-10-CM

## 2013-04-21 DIAGNOSIS — I313 Pericardial effusion (noninflammatory): Secondary | ICD-10-CM

## 2013-04-21 DIAGNOSIS — J9690 Respiratory failure, unspecified, unspecified whether with hypoxia or hypercapnia: Secondary | ICD-10-CM

## 2013-04-21 DIAGNOSIS — T380X5A Adverse effect of glucocorticoids and synthetic analogues, initial encounter: Secondary | ICD-10-CM | POA: Diagnosis present

## 2013-04-21 DIAGNOSIS — R7881 Bacteremia: Secondary | ICD-10-CM | POA: Diagnosis present

## 2013-04-21 DIAGNOSIS — E876 Hypokalemia: Secondary | ICD-10-CM | POA: Diagnosis present

## 2013-04-21 DIAGNOSIS — K219 Gastro-esophageal reflux disease without esophagitis: Secondary | ICD-10-CM | POA: Diagnosis present

## 2013-04-21 DIAGNOSIS — Z79899 Other long term (current) drug therapy: Secondary | ICD-10-CM

## 2013-04-21 DIAGNOSIS — R778 Other specified abnormalities of plasma proteins: Secondary | ICD-10-CM

## 2013-04-21 DIAGNOSIS — N390 Urinary tract infection, site not specified: Secondary | ICD-10-CM | POA: Diagnosis not present

## 2013-04-21 DIAGNOSIS — I5023 Acute on chronic systolic (congestive) heart failure: Secondary | ICD-10-CM

## 2013-04-21 DIAGNOSIS — Z7901 Long term (current) use of anticoagulants: Secondary | ICD-10-CM

## 2013-04-21 DIAGNOSIS — G4730 Sleep apnea, unspecified: Secondary | ICD-10-CM

## 2013-04-21 DIAGNOSIS — I2510 Atherosclerotic heart disease of native coronary artery without angina pectoris: Secondary | ICD-10-CM | POA: Diagnosis present

## 2013-04-21 DIAGNOSIS — I6789 Other cerebrovascular disease: Secondary | ICD-10-CM

## 2013-04-21 DIAGNOSIS — I5043 Acute on chronic combined systolic (congestive) and diastolic (congestive) heart failure: Secondary | ICD-10-CM

## 2013-04-21 DIAGNOSIS — R0602 Shortness of breath: Secondary | ICD-10-CM

## 2013-04-21 DIAGNOSIS — I80299 Phlebitis and thrombophlebitis of other deep vessels of unspecified lower extremity: Secondary | ICD-10-CM

## 2013-04-21 DIAGNOSIS — R911 Solitary pulmonary nodule: Secondary | ICD-10-CM

## 2013-04-21 DIAGNOSIS — E162 Hypoglycemia, unspecified: Secondary | ICD-10-CM | POA: Diagnosis present

## 2013-04-21 DIAGNOSIS — D689 Coagulation defect, unspecified: Secondary | ICD-10-CM

## 2013-04-21 DIAGNOSIS — I219 Acute myocardial infarction, unspecified: Secondary | ICD-10-CM

## 2013-04-21 DIAGNOSIS — IMO0002 Reserved for concepts with insufficient information to code with codable children: Secondary | ICD-10-CM

## 2013-04-21 DIAGNOSIS — N179 Acute kidney failure, unspecified: Secondary | ICD-10-CM | POA: Diagnosis present

## 2013-04-21 DIAGNOSIS — J9610 Chronic respiratory failure, unspecified whether with hypoxia or hypercapnia: Secondary | ICD-10-CM

## 2013-04-21 DIAGNOSIS — Z880 Allergy status to penicillin: Secondary | ICD-10-CM

## 2013-04-21 DIAGNOSIS — R6521 Severe sepsis with septic shock: Secondary | ICD-10-CM

## 2013-04-21 DIAGNOSIS — I200 Unstable angina: Secondary | ICD-10-CM

## 2013-04-21 DIAGNOSIS — I959 Hypotension, unspecified: Secondary | ICD-10-CM

## 2013-04-21 DIAGNOSIS — Z87891 Personal history of nicotine dependence: Secondary | ICD-10-CM

## 2013-04-21 DIAGNOSIS — J441 Chronic obstructive pulmonary disease with (acute) exacerbation: Secondary | ICD-10-CM | POA: Diagnosis present

## 2013-04-21 DIAGNOSIS — Z9861 Coronary angioplasty status: Secondary | ICD-10-CM

## 2013-04-21 DIAGNOSIS — I5042 Chronic combined systolic (congestive) and diastolic (congestive) heart failure: Secondary | ICD-10-CM | POA: Diagnosis present

## 2013-04-21 DIAGNOSIS — B002 Herpesviral gingivostomatitis and pharyngotonsillitis: Secondary | ICD-10-CM | POA: Diagnosis present

## 2013-04-21 DIAGNOSIS — F411 Generalized anxiety disorder: Secondary | ICD-10-CM | POA: Diagnosis present

## 2013-04-21 DIAGNOSIS — Z8249 Family history of ischemic heart disease and other diseases of the circulatory system: Secondary | ICD-10-CM

## 2013-04-21 DIAGNOSIS — F3289 Other specified depressive episodes: Secondary | ICD-10-CM | POA: Diagnosis present

## 2013-04-21 DIAGNOSIS — B9620 Unspecified Escherichia coli [E. coli] as the cause of diseases classified elsewhere: Secondary | ICD-10-CM

## 2013-04-21 DIAGNOSIS — M129 Arthropathy, unspecified: Secondary | ICD-10-CM | POA: Diagnosis present

## 2013-04-21 DIAGNOSIS — B370 Candidal stomatitis: Secondary | ICD-10-CM | POA: Diagnosis present

## 2013-04-21 LAB — BASIC METABOLIC PANEL
BUN: 39 mg/dL — ABNORMAL HIGH (ref 6–23)
BUN: 36 mg/dL — ABNORMAL HIGH (ref 6–23)
CHLORIDE: 108 meq/L (ref 96–112)
CO2: 25 meq/L (ref 19–32)
CO2: 22 meq/L (ref 19–32)
Calcium: 8.8 mg/dL (ref 8.4–10.5)
Calcium: 7.1 mg/dL — ABNORMAL LOW (ref 8.4–10.5)
Chloride: 103 mEq/L (ref 96–112)
Creatinine, Ser: 1.37 mg/dL — ABNORMAL HIGH (ref 0.50–1.10)
Creatinine, Ser: 1.32 mg/dL — ABNORMAL HIGH (ref 0.50–1.10)
GFR calc non Af Amer: 39 mL/min — ABNORMAL LOW (ref >90)
GFR calc non Af Amer: 40 mL/min — ABNORMAL LOW (ref >90)
GFR, EST AFRICAN AMERICAN: 45 mL/min — AB (ref >90)
GFR, EST AFRICAN AMERICAN: 47 mL/min — AB (ref >90)
Glucose, Bld: 113 mg/dL — ABNORMAL HIGH (ref 70–99)
Glucose, Bld: 140 mg/dL — ABNORMAL HIGH (ref 70–99)
POTASSIUM: 4.5 meq/L (ref 3.7–5.3)
Potassium: 3.9 mEq/L (ref 3.7–5.3)
Sodium: 142 mEq/L (ref 137–147)
Sodium: 143 mEq/L (ref 137–147)

## 2013-04-21 LAB — CBC WITH DIFFERENTIAL/PLATELET
Basophils Absolute: 0 10*3/uL (ref 0.0–0.1)
Basophils Relative: 0 % (ref 0–1)
EOS PCT: 0 % (ref 0–5)
Eosinophils Absolute: 0 10*3/uL (ref 0.0–0.7)
HCT: 32.6 % — ABNORMAL LOW (ref 36.0–46.0)
HEMOGLOBIN: 10.5 g/dL — AB (ref 12.0–15.0)
LYMPHS PCT: 4 % — AB (ref 12–46)
Lymphs Abs: 0.8 10*3/uL (ref 0.7–4.0)
MCH: 31 pg (ref 26.0–34.0)
MCHC: 32.2 g/dL (ref 30.0–36.0)
MCV: 96.2 fL (ref 78.0–100.0)
Monocytes Absolute: 1.2 10*3/uL — ABNORMAL HIGH (ref 0.1–1.0)
Monocytes Relative: 6 % (ref 3–12)
NEUTROS PCT: 90 % — AB (ref 43–77)
Neutro Abs: 18.2 10*3/uL — ABNORMAL HIGH (ref 1.7–7.7)
Platelets: 129 10*3/uL — ABNORMAL LOW (ref 150–400)
RBC: 3.39 MIL/uL — AB (ref 3.87–5.11)
RDW: 16.3 % — ABNORMAL HIGH (ref 11.5–15.5)
WBC: 20.2 10*3/uL — ABNORMAL HIGH (ref 4.0–10.5)

## 2013-04-21 LAB — STREP PNEUMONIAE URINARY ANTIGEN: Strep Pneumo Urinary Antigen: NEGATIVE

## 2013-04-21 LAB — POCT I-STAT 3, ART BLOOD GAS (G3+)
ACID-BASE DEFICIT: 6 mmol/L — AB (ref 0.0–2.0)
ACID-BASE EXCESS: 1 mmol/L (ref 0.0–2.0)
BICARBONATE: 20 meq/L (ref 20.0–24.0)
Bicarbonate: 25.5 mEq/L — ABNORMAL HIGH (ref 20.0–24.0)
O2 Saturation: 99 %
O2 Saturation: 87 %
PCO2 ART: 41.7 mmHg (ref 35.0–45.0)
PH ART: 7.402 (ref 7.350–7.450)
PO2 ART: 59 mmHg — AB (ref 80.0–100.0)
Patient temperature: 98.7
Patient temperature: 98.6
TCO2: 27 mmol/L (ref 0–100)
TCO2: 21 mmol/L (ref 0–100)
pCO2 arterial: 41.1 mmHg (ref 35.0–45.0)
pH, Arterial: 7.289 — ABNORMAL LOW (ref 7.350–7.450)
pO2, Arterial: 149 mmHg — ABNORMAL HIGH (ref 80.0–100.0)

## 2013-04-21 LAB — PHOSPHORUS: PHOSPHORUS: 2.2 mg/dL — AB (ref 2.3–4.6)

## 2013-04-21 LAB — CBC
HCT: 26.5 % — ABNORMAL LOW (ref 36.0–46.0)
Hemoglobin: 8.3 g/dL — ABNORMAL LOW (ref 12.0–15.0)
MCH: 30.5 pg (ref 26.0–34.0)
MCHC: 31.3 g/dL (ref 30.0–36.0)
MCV: 97.4 fL (ref 78.0–100.0)
PLATELETS: 107 10*3/uL — AB (ref 150–400)
RBC: 2.72 MIL/uL — AB (ref 3.87–5.11)
RDW: 16.4 % — ABNORMAL HIGH (ref 11.5–15.5)
WBC: 18.6 10*3/uL — AB (ref 4.0–10.5)

## 2013-04-21 LAB — HEPATIC FUNCTION PANEL
ALBUMIN: 1.4 g/dL — AB (ref 3.5–5.2)
ALK PHOS: 88 U/L (ref 39–117)
ALT: 11 U/L (ref 0–35)
AST: 14 U/L (ref 0–37)
BILIRUBIN TOTAL: 0.3 mg/dL (ref 0.3–1.2)
Bilirubin, Direct: <0.2 mg/dL (ref 0.0–0.3)
Total Protein: 4.4 g/dL — ABNORMAL LOW (ref 6.0–8.3)

## 2013-04-21 LAB — PROCALCITONIN: Procalcitonin: 94.52 ng/mL

## 2013-04-21 LAB — MRSA PCR SCREENING: MRSA BY PCR: NEGATIVE

## 2013-04-21 LAB — URINALYSIS, ROUTINE W REFLEX MICROSCOPIC
Bilirubin Urine: NEGATIVE
Glucose, UA: NEGATIVE mg/dL
Ketones, ur: NEGATIVE mg/dL
Nitrite: NEGATIVE
PH: 6 (ref 5.0–8.0)
Protein, ur: 100 mg/dL — AB
SPECIFIC GRAVITY, URINE: 1.014 (ref 1.005–1.030)
Urobilinogen, UA: 1 mg/dL (ref 0.0–1.0)

## 2013-04-21 LAB — POCT I-STAT TROPONIN I: TROPONIN I, POC: 0.05 ng/mL (ref 0.00–0.08)

## 2013-04-21 LAB — URINE MICROSCOPIC-ADD ON

## 2013-04-21 LAB — GLUCOSE, CAPILLARY
GLUCOSE-CAPILLARY: 183 mg/dL — AB (ref 70–99)
GLUCOSE-CAPILLARY: 31 mg/dL — AB (ref 70–99)
GLUCOSE-CAPILLARY: 86 mg/dL (ref 70–99)
Glucose-Capillary: 249 mg/dL — ABNORMAL HIGH (ref 70–99)
Glucose-Capillary: 100 mg/dL — ABNORMAL HIGH (ref 70–99)
Glucose-Capillary: 54 mg/dL — ABNORMAL LOW (ref 70–99)
Glucose-Capillary: 68 mg/dL — ABNORMAL LOW (ref 70–99)
Glucose-Capillary: 132 mg/dL — ABNORMAL HIGH (ref 70–99)

## 2013-04-21 LAB — INFLUENZA PANEL BY PCR (TYPE A & B)
H1N1FLUPCR: NOT DETECTED
Influenza A By PCR: NEGATIVE
Influenza B By PCR: NEGATIVE

## 2013-04-21 LAB — LACTIC ACID, PLASMA: Lactic Acid, Venous: 2.1 mmol/L (ref 0.5–2.2)

## 2013-04-21 LAB — PRO B NATRIURETIC PEPTIDE: Pro B Natriuretic peptide (BNP): 9278 pg/mL — ABNORMAL HIGH (ref 0–125)

## 2013-04-21 LAB — CG4 I-STAT (LACTIC ACID): LACTIC ACID, VENOUS: 3.65 mmol/L — AB (ref 0.5–2.2)

## 2013-04-21 LAB — PROTIME-INR
INR: 1.84 — ABNORMAL HIGH (ref 0.00–1.49)
PROTHROMBIN TIME: 20.7 s — AB (ref 11.6–15.2)

## 2013-04-21 LAB — MAGNESIUM: Magnesium: 1.8 mg/dL (ref 1.5–2.5)

## 2013-04-21 MED ORDER — DEXTROSE-NACL 5-0.9 % IV SOLN
INTRAVENOUS | Status: DC
  Administered 2013-04-21: 50 mL/h via INTRAVENOUS
  Administered 2013-04-23: 20 mL/h via INTRAVENOUS
  Administered 2013-04-24: via INTRAVENOUS
  Administered 2013-04-28: 10 mL/h via INTRAVENOUS

## 2013-04-21 MED ORDER — ACETAMINOPHEN 650 MG RE SUPP
650.0000 mg | Freq: Once | RECTAL | Status: AC
  Administered 2013-04-21: 650 mg via RECTAL

## 2013-04-21 MED ORDER — OSELTAMIVIR PHOSPHATE 75 MG PO CAPS
75.0000 mg | ORAL_CAPSULE | Freq: Once | ORAL | Status: DC
  Filled 2013-04-21: qty 1

## 2013-04-21 MED ORDER — SODIUM CHLORIDE 0.9 % IV BOLUS (SEPSIS)
1000.0000 mL | Freq: Once | INTRAVENOUS | Status: AC
  Administered 2013-04-21: 1000 mL via INTRAVENOUS

## 2013-04-21 MED ORDER — DEXTROSE 50 % IV SOLN
25.0000 mL | Freq: Once | INTRAVENOUS | Status: AC | PRN
  Administered 2013-04-21: 25 mL via INTRAVENOUS

## 2013-04-21 MED ORDER — DEXTROSE 50 % IV SOLN
INTRAVENOUS | Status: AC
  Filled 2013-04-21: qty 50

## 2013-04-21 MED ORDER — BIOTENE DRY MOUTH MT LIQD
15.0000 mL | Freq: Two times a day (BID) | OROMUCOSAL | Status: DC
  Administered 2013-04-21 – 2013-04-24 (×8): 15 mL via OROMUCOSAL

## 2013-04-21 MED ORDER — OSELTAMIVIR PHOSPHATE 75 MG PO CAPS
75.0000 mg | ORAL_CAPSULE | Freq: Every day | ORAL | Status: DC
  Filled 2013-04-21: qty 1

## 2013-04-21 MED ORDER — ALBUTEROL (5 MG/ML) CONTINUOUS INHALATION SOLN
INHALATION_SOLUTION | RESPIRATORY_TRACT | Status: AC
  Filled 2013-04-21: qty 20

## 2013-04-21 MED ORDER — DEXTROSE 5 % IV SOLN
2.0000 ug/min | INTRAVENOUS | Status: DC
  Administered 2013-04-21: 10 ug/min via INTRAVENOUS
  Administered 2013-04-21: 5 ug/min via INTRAVENOUS
  Filled 2013-04-21: qty 4

## 2013-04-21 MED ORDER — METHYLPREDNISOLONE SODIUM SUCC 40 MG IJ SOLR
40.0000 mg | Freq: Four times a day (QID) | INTRAMUSCULAR | Status: DC
  Administered 2013-04-21 – 2013-04-23 (×10): 40 mg via INTRAVENOUS
  Filled 2013-04-21 (×14): qty 1

## 2013-04-21 MED ORDER — VANCOMYCIN HCL IN DEXTROSE 1-5 GM/200ML-% IV SOLN
1000.0000 mg | Freq: Once | INTRAVENOUS | Status: AC
  Administered 2013-04-21: 1000 mg via INTRAVENOUS

## 2013-04-21 MED ORDER — SODIUM CHLORIDE 0.9 % IV SOLN
1000.0000 mL | Freq: Once | INTRAVENOUS | Status: AC
  Administered 2013-04-21: 1000 mL via INTRAVENOUS

## 2013-04-21 MED ORDER — OSELTAMIVIR PHOSPHATE 30 MG PO CAPS
30.0000 mg | ORAL_CAPSULE | Freq: Two times a day (BID) | ORAL | Status: DC
  Administered 2013-04-21 – 2013-04-22 (×2): 30 mg via ORAL
  Filled 2013-04-21 (×4): qty 1

## 2013-04-21 MED ORDER — IPRATROPIUM-ALBUTEROL 0.5-2.5 (3) MG/3ML IN SOLN
3.0000 mL | Freq: Four times a day (QID) | RESPIRATORY_TRACT | Status: DC
  Administered 2013-04-21 – 2013-04-25 (×19): 3 mL via RESPIRATORY_TRACT
  Filled 2013-04-21 (×20): qty 3

## 2013-04-21 MED ORDER — IPRATROPIUM BROMIDE 0.02 % IN SOLN
0.5000 mg | Freq: Four times a day (QID) | RESPIRATORY_TRACT | Status: DC
Start: 2013-04-21 — End: 2013-04-21
  Administered 2013-04-21: 0.5 mg via RESPIRATORY_TRACT

## 2013-04-21 MED ORDER — SODIUM CHLORIDE 0.9 % IV SOLN
1000.0000 mL | INTRAVENOUS | Status: DC
  Administered 2013-04-21: 1000 mL via INTRAVENOUS

## 2013-04-21 MED ORDER — IPRATROPIUM BROMIDE 0.02 % IN SOLN
RESPIRATORY_TRACT | Status: AC
  Filled 2013-04-21: qty 2.5

## 2013-04-21 MED ORDER — INSULIN ASPART 100 UNIT/ML ~~LOC~~ SOLN
0.0000 [IU] | SUBCUTANEOUS | Status: DC
  Administered 2013-04-21: 4 [IU] via SUBCUTANEOUS
  Administered 2013-04-21: 7 [IU] via SUBCUTANEOUS
  Administered 2013-04-22 (×3): 3 [IU] via SUBCUTANEOUS
  Administered 2013-04-22 – 2013-04-23 (×2): 4 [IU] via SUBCUTANEOUS

## 2013-04-21 MED ORDER — SODIUM CHLORIDE 0.9 % IV SOLN
INTRAVENOUS | Status: DC
  Administered 2013-04-21 (×2): via INTRAVENOUS

## 2013-04-21 MED ORDER — VANCOMYCIN HCL IN DEXTROSE 1-5 GM/200ML-% IV SOLN
1000.0000 mg | INTRAVENOUS | Status: DC
  Administered 2013-04-22 – 2013-04-23 (×2): 1000 mg via INTRAVENOUS
  Filled 2013-04-21 (×2): qty 200

## 2013-04-21 MED ORDER — DEXTROSE 5 % IV SOLN
1.0000 g | Freq: Once | INTRAVENOUS | Status: AC
  Administered 2013-04-21: 1 g via INTRAVENOUS
  Filled 2013-04-21: qty 1

## 2013-04-21 MED ORDER — ACETAMINOPHEN 650 MG RE SUPP
RECTAL | Status: AC
  Filled 2013-04-21: qty 1

## 2013-04-21 MED ORDER — HEPARIN SODIUM (PORCINE) 5000 UNIT/ML IJ SOLN
5000.0000 [IU] | Freq: Three times a day (TID) | INTRAMUSCULAR | Status: DC
  Administered 2013-04-21 – 2013-04-24 (×11): 5000 [IU] via SUBCUTANEOUS
  Filled 2013-04-21 (×14): qty 1

## 2013-04-21 MED ORDER — PANTOPRAZOLE SODIUM 40 MG IV SOLR
40.0000 mg | Freq: Every day | INTRAVENOUS | Status: DC
  Administered 2013-04-21 – 2013-04-22 (×2): 40 mg via INTRAVENOUS
  Filled 2013-04-21 (×4): qty 40

## 2013-04-21 MED ORDER — DEXTROSE 5 % IV SOLN
1.0000 g | Freq: Three times a day (TID) | INTRAVENOUS | Status: DC
  Administered 2013-04-21: 1 g via INTRAVENOUS
  Filled 2013-04-21 (×3): qty 1

## 2013-04-21 MED ORDER — CEFEPIME HCL 2 G IJ SOLR
2.0000 g | INTRAMUSCULAR | Status: DC
  Administered 2013-04-21 – 2013-04-23 (×3): 2 g via INTRAVENOUS
  Filled 2013-04-21 (×4): qty 2

## 2013-04-21 MED ORDER — CHLORHEXIDINE GLUCONATE 0.12 % MT SOLN
15.0000 mL | Freq: Two times a day (BID) | OROMUCOSAL | Status: DC
  Administered 2013-04-21 – 2013-04-24 (×8): 15 mL via OROMUCOSAL
  Filled 2013-04-21 (×9): qty 15

## 2013-04-21 MED ORDER — ALBUTEROL (5 MG/ML) CONTINUOUS INHALATION SOLN
10.0000 mg/h | INHALATION_SOLUTION | Freq: Once | RESPIRATORY_TRACT | Status: AC
  Administered 2013-04-21: 10 mg/h via RESPIRATORY_TRACT

## 2013-04-21 MED ORDER — ALBUTEROL (5 MG/ML) CONTINUOUS INHALATION SOLN: 10.0000 mg/h | INHALATION_SOLUTION | Freq: Once | RESPIRATORY_TRACT | Status: DC

## 2013-04-21 MED ORDER — ONDANSETRON HCL 4 MG/2ML IJ SOLN
4.0000 mg | Freq: Four times a day (QID) | INTRAMUSCULAR | Status: DC | PRN
Start: 2013-04-21 — End: 2013-04-30
  Administered 2013-04-22 – 2013-04-26 (×9): 4 mg via INTRAVENOUS
  Filled 2013-04-21 (×9): qty 2

## 2013-04-21 MED ORDER — ACETAMINOPHEN 325 MG PO TABS
650.0000 mg | ORAL_TABLET | Freq: Four times a day (QID) | ORAL | Status: DC | PRN
  Administered 2013-04-21 – 2013-04-26 (×11): 650 mg via ORAL
  Filled 2013-04-21 (×12): qty 2

## 2013-04-21 MED ORDER — FENTANYL CITRATE 0.05 MG/ML IJ SOLN
12.5000 ug | INTRAMUSCULAR | Status: DC | PRN
  Administered 2013-04-22 – 2013-04-24 (×4): 25 ug via INTRAVENOUS
  Filled 2013-04-21 (×3): qty 2

## 2013-04-21 MED ORDER — ALBUTEROL SULFATE (2.5 MG/3ML) 0.083% IN NEBU
2.5000 mg | INHALATION_SOLUTION | Freq: Four times a day (QID) | RESPIRATORY_TRACT | Status: DC
  Administered 2013-04-21: 2.5 mg via RESPIRATORY_TRACT
  Filled 2013-04-21 (×4): qty 3

## 2013-04-21 MED ORDER — ALBUTEROL SULFATE (2.5 MG/3ML) 0.083% IN NEBU
2.5000 mg | INHALATION_SOLUTION | RESPIRATORY_TRACT | Status: DC | PRN
  Filled 2013-04-21: qty 3

## 2013-04-21 MED ORDER — DEXTROSE 5 % IV SOLN
500.0000 mg | INTRAVENOUS | Status: DC
  Administered 2013-04-21: 03:00:00 via INTRAVENOUS
  Administered 2013-04-22 – 2013-04-24 (×3): 500 mg via INTRAVENOUS
  Filled 2013-04-21 (×3): qty 500

## 2013-04-21 MED ORDER — OSELTAMIVIR PHOSPHATE 75 MG PO CAPS
75.0000 mg | ORAL_CAPSULE | Freq: Two times a day (BID) | ORAL | Status: DC
  Filled 2013-04-21 (×2): qty 1

## 2013-04-21 NOTE — ED Notes (Signed)
MD at bedside. 

## 2013-04-21 NOTE — ED Notes (Signed)
Levophed started at 5mcg 

## 2013-04-21 NOTE — ED Notes (Signed)
Pt speaking in 1-2 words at a time

## 2013-04-21 NOTE — Progress Notes (Signed)
ANTIBIOTIC CONSULT NOTE - INITIAL  Pharmacy Consult for Cefepime Indication: GN bacteremia  Allergies  Allergen Reactions  . Aspirin Other (See Comments)    Patient states no reaction, only told not to take while on other blood thinner  . Levofloxacin Hives  . Other Itching and Rash  . Penicillins Hives and Itching    Patient Measurements: Height: 5' (152.4 cm) Weight: 154 lb 12.8 oz (70.217 kg) IBW/kg (Calculated) : 45.5 Vital Signs: Temp: 99.3 F (37.4 C) (01/02 1600) Temp src: Core (Comment) (01/02 1600) BP: 120/72 mmHg (01/02 1900) Pulse Rate: 102 (01/02 1900) Intake/Output from previous day: 01/01 0701 - 01/02 0700 In: 609.8 [I.V.:559.8; IV Piggyback:50] Out: -  Labs:  Recent Labs  04/21/13 0110 04/21/13 0251  WBC 20.2* 18.6*  HGB 10.5* 8.3*  PLT 129* 107*  CREATININE 1.37* 1.32*   Estimated Creatinine Clearance: 35.7 ml/min (by C-G formula based on Cr of 1.32).  Microbiology: Recent Results (from the past 720 hour(s))  CULTURE, BLOOD (ROUTINE X 2)     Status: None   Collection Time    04/21/13 12:44 AM      Result Value Range Status   Specimen Description BLOOD LEFT HAND   Final   Special Requests BOTTLES DRAWN AEROBIC ONLY 8CC   Final   Culture  Setup Time     Final   Value: 04/21/2013 09:03     Performed at [facility]   Culture     Final   Value: GRAM NEGATIVE RODS     2 Note: Gram Stain Report Called to,Read Back By and Verified With: SAMANTHA HARDY RN 1 21 630P EDMOJ     Performed at [facility]   Report Status PENDING   Incomplete  CULTURE, BLOOD (ROUTINE X 2)     Status: None   Collection Time    04/21/13  1:10 AM      Result Value Range Status   Specimen Description BLOOD LEFT ARM   Final   Special Requests BOTTLES DRAWN AEROBIC AND ANAEROBIC 10CC   Final   Culture  Setup Time     Final   Value: 04/21/2013 09:02     Performed at [facility]   Culture     Final   Value: GRAM NEGATIVE RODS     2 Note: Gram  Stain Report Called to,Read Back By and Verified With: SAMANTHA HARDY RN 1 15 630P EDMOJ     Performed at [facility]   Report Status PENDING   Incomplete  MRSA PCR SCREENING     Status: None   Collection Time    04/21/13  4:46 AM      Result Value Range Status   MRSA by PCR NEGATIVE  NEGATIVE Final   Comment:            The GeneXpert MRSA Assay (FDA     approved for NASAL specimens     only), is one component of a     comprehensive MRSA colonization     surveillance program. It is not     intended to diagnose MRSA     infection nor to guide or     monitor treatment for     MRSA infections.   Assessment: 31 YOF on vancomycin, aztreonam, azithromycin, and tamiflu for sepsis now growing 2/2 blood cultures with gram negative rods. Called Dr. Lamonte Sakai and received orders to change Aztreonam to Cefepime as patient has history of pseudomonas and recent hospitalizations. Patient  does have listed allergy to penicillin but has tolerated cefepime in past stay. Current CrCl ~35-40 mL/min.   Goal of Therapy:  Clinical resolution of infection  Plan:  Cefepime 2g IV q24h.   Sloan Leiter, PharmD, BCPS Clinical Pharmacist [phone] 04/21/2013,7:19 PM

## 2013-04-21 NOTE — ED Notes (Signed)
Per EMS: pt coming from home with c/o respiratory distress for the past four hours. Pt took one treatment at home with no relief, EMS gave pt albuterol x 3, Atrovent x 1, 125 solu-medrol. Pt was diaphoretic upon EMS arrival. Pt is A&Ox4, respirations labored and tachy, skin warm and diaphoretic. Hx of COPD, CHF.

## 2013-04-21 NOTE — Progress Notes (Signed)
Hypoglycemic Event  CBG: 35 1209  Treatment: 35mL IV D50  Symptoms:none  Follow-up CBG: Time:1235 CBG Result 86  Possible Reasons for Event: inadequate intake  Comments/MD notified:hypoglycemia protocol initiated     Raquel James  Remember to initiate Hypoglycemia Order Set & complete

## 2013-04-21 NOTE — Progress Notes (Signed)
Name: Aimee Sanders MRN: [id number] DOB: 14-Jan-1946    ADMISSION DATE:  04/21/2013  REFERRING MD :  Ripley Fraise  CHIEF COMPLAINT:  Can't breath  BRIEF PATIENT DESCRIPTION:  68 yo female former smoker presents with progressive dyspnea, cough, sputum, fever, and wheezing.  PCCM asked to admit to ICU for respiratory failure, PNA, sepsis, and AECOPD.   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  SIGNIFICANT EVENTS: 1/02 Admit to ICU  STUDIES:   LINES / TUBES: PIV  CULTURES: Blood 1/02 >> Influenza PCR 1/02 >> Pneumococcal Ag 1/02 >>  ANTIBIOTICS: Vancomycin 1/02 >> Zithromax 1/02 >> Aztreonam 1/02 >>  SUBJECTIVE: afebrile Denies CP Wants water  VITAL SIGNS: Temp:  [97.7 F (36.5 C)-102.4 F (39.1 C)] 98.4 F (36.9 C) (01/02 1000) Pulse Rate:  [57-141] 99 (01/02 1015) Resp:  [17-100] 20 (01/02 1015) BP: (77-208)/(42-184) 109/67 mmHg (01/02 1015) SpO2:  [92 %-100 %] 100 % (01/02 1015) FiO2 (%):  [50 %-80 %] 70 % (01/02 1000) Weight:  [61.236 kg (135 lb)-70.217 kg (154 lb 12.8 oz)] 70.217 kg (154 lb 12.8 oz) (01/02 0500) HEMODYNAMICS: CVP:  [13 mmHg-16 mmHg] 14 mmHg VENTILATOR SETTINGS: Vent Mode:  [-] BIPAP FiO2 (%):  [50 %-80 %] 70 % Set Rate:  [14 bmp] 14 bmp PEEP:  [6 cmH20] 6 cmH20 Pressure Support:  [8 cmH20] 8 cmH20 INTAKE / OUTPUT: Intake/Output     01/01 0701 - 01/02 0700 01/02 0701 - 01/03 0700   I.V. (mL/kg) 559.8 (8) 347 (4.9)   IV Piggyback 50    Total Intake(mL/kg) 609.8 (8.7) 347 (4.9)   Net +609.8 +347          PHYSICAL EXAMINATION: General: Ill appearing Neuro:  Alert, normal strength, follows commands HEENT:  Pupils reactive, BiPAP mask in place Cardiovascular:  Regular, tachycardic, no murmur Lungs:  Decreased breath sounds, faint wheeze, rales Lt base Abdomen:  Soft, decreased bowel sounds, non tender, large warts suprapubic area Musculoskeletal:  RLE 2+ edema Skin:  No rashes  LABS:  CBC  Recent Labs Lab 04/21/13 0110  04/21/13 0251  WBC 20.2* 18.6*  HGB 10.5* 8.3*  HCT 32.6* 26.5*  PLT 129* 107*   Coag's  Recent Labs Lab 04/21/13 0326  INR 1.84*   BMET  Recent Labs Lab 04/21/13 0110 04/21/13 0251  NA 142 143  K 4.5 3.9  CL 103 108  CO2 25 22  BUN 39* 36*  CREATININE 1.37* 1.32*  GLUCOSE 113* 140*   Electrolytes  Recent Labs Lab 04/21/13 0110 04/21/13 0251  CALCIUM 8.8 7.1*  MG  --  1.8  PHOS  --  2.2*   Sepsis Markers  Recent Labs Lab 04/21/13 0128 04/21/13 0238 04/21/13 0516  LATICACIDVEN 3.65*  --  2.1  PROCALCITON  --  94.52  --    ABG  Recent Labs Lab 04/21/13 0114 04/21/13 0458  PHART 7.402 7.289*  PCO2ART 41.1 41.7  PO2ART 149.0* 59.0*   Liver Enzymes  Recent Labs Lab 04/21/13 0251  AST 14  ALT 11  ALKPHOS 88  BILITOT 0.3  ALBUMIN 1.4*   Cardiac Enzymes  Recent Labs Lab 04/21/13 0110  PROBNP 9278.0*   Glucose  Recent Labs Lab 04/21/13 0507  GLUCAP 249*    Imaging Dg Chest Port 1 View  04/21/2013   CLINICAL DATA:  CVL placement  EXAM: PORTABLE CHEST - 1 VIEW  COMPARISON:  04/21/2013 at 0052 hr  FINDINGS: Technically limited study due to patient rotation. There  has been placement of a left central venous catheter. The tip overlies the low SVC. No pneumothorax. Small left pleural effusion with basilar atelectasis or infiltration. The heart size and pulmonary vascularity not appear enlarged. Previous thoracic kyphoplasty is.  IMPRESSION: Limited study due to patient rotation. Central venous catheter tip appears to be located over the low SVC region. No pneumothorax.   Electronically Signed   By: Lucienne Capers M.D.   On: 04/21/2013 05:30   Dg Chest Portable 1 View  04/21/2013   CLINICAL DATA:  Respiratory distress  EXAM: PORTABLE CHEST - 1 VIEW  COMPARISON:  12/13/2012  FINDINGS: Shallow inspiration. Heart size and pulmonary vascularity are normal. Persistent left lower lobe opacity suggesting atelectasis. A central obstructing lesion  should be excluded due to the persistence of this finding. CT is recommended. No blunting of costophrenic angles. No pneumothorax. Calcification of the aorta. Previous thoracic kyphoplasty.  IMPRESSION: Persistent atelectasis in the left lung base. Consider CT to exclude a centrally obstructing lesion.   Electronically Signed   By: Lucienne Capers M.D.   On: 04/21/2013 01:28    ASSESSMENT / PLAN:  PULMONARY A: Acute respiratory failure 2nd to PNA and AECOPD. Hx of sleep apnea. P:   -continue BiPAP prn>> tranwsition to [place] -scheduled BD's -solumedrol   CARDIOVASCULAR A:  Sepsis. Chronic systolic/diastolic CHF. Hx of CAD, hyperlipidemia, PVD. Hx of DVT on chronic xarelto. P:  -Off  pressors -monitor hemodynamics -hold outpt fenofibrate, lasix, imdur, lopressor, xarelto for now  -duplex BLEs  RENAL A:   Acute kidney injury 2nd to hypotension, hypoxia >> baseline creatinine is 0.55 from 12/16/12. P:   -optimize hemodynamics, oxygenation -f/u renal fx, urine outpt, electrolytes -foley while critically ill  GASTROINTESTINAL A:   Nutrition. Hx of hiatal hernia. P:   -NPO except meds, until off bipap -protonix for SUP  HEMATOLOGIC A:   Anemia, thrombocytopenia of critical illness. Leukocytosis. P:  -f/u CBC  INFECTIOUS A:   PNA >> has recently been on Abx, and prednisone.  Hx of allergies to PCN and levaquin. P:   -droplet isolation -vancomycin, aztreonam, zithromax, tamiflu  ENDOCRINE A:   Steroid induced hyperglycemia.   P:   -SSI while on steroids  NEUROLOGIC A:   Hx of anxiety, depression, chronic pain. P:   -prn fentanyl  -hold outpt xanax, celexa, flexeril, neurontin, percocet for now  Updated pt's family at bedside.  The patient is critically ill with multiple organ systems failure and requires high complexity decision making for assessment and support, frequent evaluation and titration of therapies, application of advanced monitoring technologies  and extensive interpretation of multiple databases. Critical Care Time devoted to patient care services described in this note is 35 minutes.    Kara Mead MD. [facility]. [facility] Pager [phone] If no response call [phone]   04/21/2013, 10:52 AM

## 2013-04-21 NOTE — Progress Notes (Signed)
Hypoglycemic Event  CBG: 1538 54  Treatment: 50mL IV D50  Symptoms: none  Follow-up CBG: Time: 1620 CBG Result:100  Possible Reasons for Event: inadequate intake  Comments/MD notified:hypoglycemic protocol initiated     Raquel James  Remember to initiate Hypoglycemia Order Set & complete

## 2013-04-21 NOTE — ED Notes (Signed)
Critical care called regarding pt's blood pressure after third bolus. Dr. Joya Gaskins placing levophed order

## 2013-04-21 NOTE — Progress Notes (Signed)
eLink Physician-Brief Progress Note Patient Name: Aimee Sanders DOB: 04/07/1946 MRN: [id number]  Date of Service  04/21/2013   HPI/Events of Note   Persistent hypoglycemia.  eICU Interventions  IVF changed from NS to D5NS at the same rate.      [name] 04/21/2013, 9:48 PM

## 2013-04-21 NOTE — ED Notes (Signed)
Dr.Sood at bedside  

## 2013-04-21 NOTE — Progress Notes (Signed)
Hypoglycemic Event  CBG: 68  Treatment: D50 IV 25 mL  Symptoms: None  Follow-up CBG: Time:2050 CBG Result:132  Possible Reasons for Event: Unknown  Comments/MD notified:Dr Adline Potter, Elfredia Nevins  Remember to initiate Hypoglycemia Order Set & complete

## 2013-04-21 NOTE — Progress Notes (Signed)
Utilization review completed. Zadrian Mccauley, RN, BSN. 

## 2013-04-21 NOTE — Progress Notes (Signed)
ANTIBIOTIC CONSULT NOTE - INITIAL  Pharmacy Consult for Vancomycin and Aztreonam Indication: rule out sepsis  Allergies  Allergen Reactions  . Aspirin Other (See Comments)    Patient states no reaction, only told not to take while on other blood thinner  . Levofloxacin Hives  . Other Itching and Rash  . Penicillins Hives and Itching    Patient Measurements: Height: 5' (152.4 cm) Weight: 135 lb (61.236 kg) IBW/kg (Calculated) : 45.5  Vital Signs: Temp: 102.4 F (39.1 C) (01/02 0133) Temp src: Rectal (01/02 0133) BP: 77/54 mmHg (01/02 0225) Pulse Rate: 115 (01/02 0220) Intake/Output from previous day:   Intake/Output from this shift:    Labs:  Recent Labs  04/21/13 0110  WBC 20.2*  HGB 10.5*  PLT 129*  CREATININE 1.37*   Estimated Creatinine Clearance: 32.1 ml/min (by C-G formula based on Cr of 1.37). No results found for this basename: VANCOTROUGH, VANCOPEAK, VANCORANDOM, GENTTROUGH, GENTPEAK, GENTRANDOM, TOBRATROUGH, TOBRAPEAK, TOBRARND, AMIKACINPEAK, AMIKACINTROU, AMIKACIN,  in the last 72 hours   Microbiology: No results found for this or any previous visit (from the past 720 hour(s)).  Medical History: Past Medical History  Diagnosis Date  . Chronic combined systolic and diastolic CHF (congestive heart failure)     11/2011 - Echo: EF 40-45% w/ Gr 1 DD.  Marland Kitchen Deep vein thrombosis     Chronic Xarelto.  . Hyperlipidemia   . Sleep apnea   . 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 Coronary atherosclerosis of native coronary artery     a. reportedly s/p MI x 2;  b. March 2014 Cardiolite: EF 68% with scar affecting the inferolateral and anterolateral wall; mild ischemia in the mid-septum-> Med Rx.  . Chronic pain     Due to prior hip fracture and DDD back  . Peripheral vascular disease   . Pneumonia     "now and several times before" (07/26/2012)  . H/O hiatal hernia   . Stroke 2002; 2004   Denies residual; "both really light strokes" (07/26/2012)  . Arthritis     "all over" (07/26/2012)  . DDD (degenerative disc disease), lumbosacral   . Depression   . Anxiety     Medications:  Albuterol  Xanax Celexa  Flexeril  Colace  Tricor  Iron  Advair  Folic Acid  Lasix  Neurontin  Duoneb   Imdur  Lopressor  Singulair  Ntg  Nystatin  Prilosec  Percocet  Prednisone  Xarelto  Spiriva  Tobradex    Assessment: 68 yo female with SOB/hypotension for empiric antibiotics.  Vancomycin 1 g IV given in ED at 0200  Goal of Therapy:  Vancomycin trough level 15-20 mcg/ml  Plan:  Vancomycin 1 g IV q24h Aztreonam 1 g IV q8h  Caryl Pina 04/21/2013,3:01 AM

## 2013-04-21 NOTE — ED Provider Notes (Signed)
CSN: [id number]     Arrival date & time 04/21/13  0024 History   First MD Initiated Contact with Patient 04/21/13 0030     Chief Complaint  Patient presents with  . Respiratory Distress    Patient is a 68 y.o. female presenting with shortness of breath. The history is provided by the patient and the EMS personnel. The history is limited by the condition of the patient.  Shortness of Breath Severity:  Severe Onset quality:  Sudden Duration:  4 hours Timing:  Constant Progression:  Worsening Chronicity:  Recurrent Relieved by:  Oxygen (nebulizer) Worsened by:  Nothing tried Associated symptoms: cough and diaphoresis   pt presents from home for cough and SOB that started over 4 hrs ago EMS reports that initially she was tachypneic/diaphoretic She received nebs for wheezing and this improved some of her symptoms   Past Medical History  Diagnosis Date  . Chronic combined systolic and diastolic CHF (congestive heart failure)     11/2011 - Echo: EF 40-45% w/ Gr 1 DD.  Marland Kitchen Deep vein thrombosis     Chronic Xarelto.  . Hyperlipidemia   . Sleep apnea   . 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 Coronary atherosclerosis of native coronary artery     a. reportedly s/p MI x 2;  b. March 2014 Cardiolite: EF 68% with scar affecting the inferolateral and anterolateral wall; mild ischemia in the mid-septum-> Med Rx.  . Chronic pain     Due to prior hip fracture and DDD back  . Peripheral vascular disease   . Pneumonia     "now and several times before" (07/26/2012)  . H/O hiatal hernia   . Stroke 2002; 2004    Denies residual; "both really light strokes" (07/26/2012)  . Arthritis     "all over" (07/26/2012)  . DDD (degenerative disc disease), lumbosacral   . Depression   . Anxiety    Past Surgical History  Procedure Laterality Date  . Appendectomy    . Cholecystectomy    . Carpal tunnel release Bilateral   . Coronary  angioplasty with stent placement      "1; total of 1" (07/26/2012)  . Total hip arthroplasty Right   . Hernia repair      "stomach" (07/26/2012)  . Abdominal hysterectomy    . Fracture surgery    . Foot fracture surgery Left   . Breast surgery Bilateral     benign tumors  . Tumor excision      "stomach and LLE" (07/26/2012)  . Back surgery    . Fixation kyphoplasty lumbar spine     Family History  Problem Relation Age of Onset  . Hypertension Mother   . Breast cancer Mother   . Rheum arthritis Maternal Grandmother   . Cancer Sister    History  Substance Use Topics  . Smoking status: Former Smoker -- 1.00 packs/day for 55 years    Types: Cigarettes    Quit date: 08/18/2012  . Smokeless tobacco: Never Used  . Alcohol Use: No   OB History   Grav Para Term Preterm Abortions TAB SAB Ect Mult Living                 Review of Systems  Unable to perform ROS: Severe respiratory distress  Constitutional: Positive for diaphoresis.  Respiratory: Positive for cough and shortness of breath.     Allergies  Aspirin; Levofloxacin;  Other; and Penicillins  Home Medications   Current Outpatient Rx  Name  Route  Sig  Dispense  Refill  . albuterol (PROVENTIL HFA;VENTOLIN HFA) 108 (90 BASE) MCG/ACT inhaler   Inhalation   Inhale 2 puffs into the lungs every 6 (six) hours as needed for wheezing.   1 Inhaler   2   . albuterol (PROVENTIL) (2.5 MG/3ML) 0.083% nebulizer solution   Nebulization   Take 3 mLs (2.5 mg total) by nebulization every 6 (six) hours as needed for wheezing.   75 mL   12   . ALPRAZolam (XANAX) 0.5 MG tablet   Oral   Take 1 tablet (0.5 mg total) by mouth 4 (four) times daily.   120 tablet   0     Do not fill till 04/08/13   . Alum & Mag Hydroxide-Simeth (MAGIC MOUTHWASH) SOLN   Oral   Take 10 mL by mouth 4 (four) times daily.   300 mL   0   . amoxicillin-clavulanate (AUGMENTIN) 875-125 MG per tablet   Oral   Take 1 tablet by mouth 2 (two) times daily.    10 tablet   0   . citalopram (CELEXA) 20 MG tablet   Oral   Take 1 tablet (20 mg total) by mouth daily.   30 tablet   3   . cyclobenzaprine (FLEXERIL) 5 MG tablet      TAKE 1 TABLET BY MOUTH TYID AS NEEDED FOR MUSCLE SPASMS   30 tablet   0   . docusate sodium 100 MG CAPS   Oral   Take 100 mg by mouth 2 (two) times daily.   10 capsule   0   . fenofibrate 160 MG tablet   Oral   Take 1 tablet (160 mg total) by mouth daily.   90 tablet   0   . ferrous sulfate 325 (65 FE) MG tablet   Oral   Take 1 tablet (325 mg total) by mouth 3 (three) times daily with meals.   90 tablet   3   . Fluticasone-Salmeterol (ADVAIR) 250-50 MCG/DOSE AEPB   Inhalation   Inhale 1 puff into the lungs every 12 (twelve) hours.   1 each   3   . folic acid (FOLVITE) 1 MG tablet   Oral   Take 1 tablet (1 mg total) by mouth daily.   30 tablet   3   . furosemide (LASIX) 40 MG tablet   Oral   Take 1 tablet (40 mg total) by mouth daily.   90 tablet   2   . gabapentin (NEURONTIN) 300 MG capsule   Oral   Take 300-600 mg by mouth 4 (four) times daily. Takes 1 capsule in morning, 1 capsule at noon, 1capsule in evening and 2 capsules at bedtime, every day.         . gabapentin (NEURONTIN) 300 MG capsule      TAKE 1 CAPSULE BY MOUTH IN THE MORNING ,1 AT NOON,1 IN PM AND 2 AT BEDTIME   150 capsule   0   . ipratropium-albuterol (DUONEB) 0.5-2.5 (3) MG/3ML SOLN   Nebulization   Take 3 mLs by nebulization 3 (three) times daily. For shortness of breath or wheezing   360 mL   3   . isosorbide mononitrate (IMDUR) 30 MG 24 hr tablet   Oral   Take 1 tablet (30 mg total) by mouth daily.   15 tablet   0     PATIENT NEED OFFICE  VISIT   . metoprolol tartrate (LOPRESSOR) 25 MG tablet   Oral   Take 0.5 tablets (12.5 mg total) by mouth daily.   30 tablet   0   . metoprolol tartrate (LOPRESSOR) 25 MG tablet      TAKE 1/2 TABLET BY MOUTH DAILY   30 tablet   0   . montelukast (SINGULAIR) 10  MG tablet   Oral   Take 1 tablet (10 mg total) by mouth daily.   30 tablet   3   . nitroGLYCERIN (NITROSTAT) 0.4 MG SL tablet   Sublingual   Place 0.4 mg under the tongue every 5 (five) minutes as needed. For chest pain         . nystatin (MYCOSTATIN) 100000 UNIT/ML suspension   Oral   Take 5 mL (500,000 Units total) by mouth 4 (four) times daily. Use 48 hours after symptoms resolved   120 mL   0   . omeprazole (PRILOSEC) 20 MG capsule   Oral   Take 1 capsule (20 mg total) by mouth daily.   30 capsule   3   . ondansetron (ZOFRAN) 4 MG tablet      TAKE 1 TABLET (4 MG TOTAL) BY MOUTH EVERY 6 (SIX) HOURS AS NEEDED FOR NAUSEA.   20 tablet   0   . oxyCODONE-acetaminophen (PERCOCET) 10-325 MG per tablet   Oral   Take 1 tablet by mouth every 8 (eight) hours as needed for pain.   90 tablet   0     Do not fill till 04/08/13   . polyethylene glycol (MIRALAX / GLYCOLAX) packet      TAKE 17 G BY MOUTH DAILY.   14 packet   0   . predniSONE (DELTASONE) 10 MG tablet               . Rivaroxaban (XARELTO) 20 MG TABS tablet   Oral   Take 1 tablet (20 mg total) by mouth daily.   30 tablet   1   . senna (SENOKOT) 8.6 MG TABS tablet   Oral   Take 1 tablet (8.6 mg total) by mouth daily.   120 each   0   . SPIRIVA HANDIHALER 18 MCG inhalation capsule               . tobramycin-dexamethasone (TOBRADEX) ophthalmic solution   Both Eyes   Place 1 drop into both eyes every 4 (four) hours while awake.   5 mL   0    BP 208/184  Pulse 141  Temp(Src) 98.7 F (37.1 C) (Oral)  Resp 29  SpO2 95%  LMP 07/08/1972 BP 77/54  Pulse 115  Temp(Src) 102.4 F (39.1 C) (Rectal)  Resp 17  Ht 5' (1.524 m)  Wt 135 lb (61.236 kg)  BMI 26.37 kg/m2  SpO2 100%  LMP 07/08/1972  Physical Exam CONSTITUTIONAL: ill appearing, appears older than stated age, diaphoretic HEAD: Normocephalic/atraumatic EYES: EOMI ENMT: Mucous membranes moist NECK: supple no meningeal  signs SPINE:entire spine nontender CV: tachycardic, no loud murmurs LUNGS: tachypneic, decreased BS noted bilaterally with wheeze and crackles ABDOMEN: soft, nontender, no rebound or guarding, obese GU:no cva tenderness NEURO: Pt is awake/alert, moves all extremitiesx4 EXTREMITIES: pulses normal, full ROM SKIN: warm, color normal PSYCH: anxious  ED Course  Procedures (including critical care time) CRITICAL CARE Performed by: Sharyon Cable Total critical care time: 50 Critical care time was exclusive of separately billable procedures and treating other patients. Critical care was necessary to  treat or prevent imminent or life-threatening deterioration. Critical care was time spent personally by me on the following activities: development of treatment plan with patient and/or surrogate as well as nursing, discussions with consultants, evaluation of patient's response to treatment, examination of patient, obtaining history from patient or surrogate, ordering and performing treatments and interventions, ordering and review of laboratory studies, ordering and review of radiographic studies, pulse oximetry and re-evaluation of patient's condition.   1:18 AM Pt ill appearing on arrival, distress noted.  However she is mentating appropriately will start bipap Will follow closely 1:41 AM Pt febrile with resp symptoms Suspicious for influenza Will need admission 2:38 AM Pt has been seen by critical care and will be admitted for sepsis Pt still hypotensive despite IV fluids and will need ICU care She is still on bipap I spoke to family who confirm she has increased cough and SOB for several days  Labs Review Labs Reviewed  CULTURE, BLOOD (ROUTINE X 2)  CULTURE, BLOOD (ROUTINE X 2)  CBC WITH DIFFERENTIAL  BASIC METABOLIC PANEL  PRO B NATRIURETIC PEPTIDE   Imaging Review No results found.  EKG Interpretation    Date/Time:  Friday April 21 2013 00:47:58 EST Ventricular Rate:   137 PR Interval:  150 QRS Duration: 94 QT Interval:  294 QTC Calculation: 444 R Axis:   -68 Text Interpretation:  Sinus tachycardia Ventricular premature complex Inferior infarct, old Consider anterior infarct Lateral leads are also involved Artifact in lead(s) I II III aVR aVL aVF V2 V5 V6 when compared to prior rate is faster today Confirmed by Christy Gentles  MD, [place] [phone]) on 04/21/2013 1:38:26 AM            MDM  No diagnosis found. Nursing notes including past medical history and social history reviewed and considered in documentation xrays reviewed and considered Labs/vital reviewed and considered     Sharyon Cable, MD 04/21/13 [phone]

## 2013-04-21 NOTE — H&P (Signed)
Name: Aimee Sanders MRN: [id number] DOB: [birth year]    ADMISSION DATE:  04/21/2013  REFERRING MD :  Ripley Fraise  CHIEF COMPLAINT:  Can't breath  BRIEF PATIENT DESCRIPTION:  68 yo female former smoker presents with progressive dyspnea, cough, sputum, fever, and wheezing.  PCCM asked to admit to ICU for respiratory failure, PNA, sepsis, and AECOPD.    SIGNIFICANT EVENTS: 1/02 Admit to ICU  STUDIES:   LINES / TUBES: PIV  CULTURES: Blood 1/02 >> Influenza PCR 1/02 >> Pneumococcal Ag 1/02 >>  ANTIBIOTICS: Vancomycin 1/02 >> Zithromax 1/02 >> Aztreonam 1/02 >>  HISTORY OF PRESENT ILLNESS:   68 yo female former smoker has several days of cough, sputum, and wheezing.  She has been feeling weak.  She has fever up to 102F.  She c/o chest tightness/discomfort and feeling nauseous.  She was getting more short of breath and came to the emergency room.  She was given solumedrol, albuterol nebulizer and BiPAP >> these improved her dyspnea.  She was also noted to have low blood pressure, and there was concern for sepsis from pneumonia.  As a result PCCM was asked to assess for ICU admission.  PAST MEDICAL HISTORY :  Past Medical History  Diagnosis Date  . Chronic combined systolic and diastolic CHF (congestive heart failure)     11/2011 - Echo: EF 40-45% w/ Gr 1 DD.  Marland Kitchen Deep vein thrombosis     Chronic Xarelto.  . Hyperlipidemia   . Sleep apnea   . 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 Coronary atherosclerosis of native coronary artery     a. reportedly s/p MI x 2;  b. March 2014 Cardiolite: EF 68% with scar affecting the inferolateral and anterolateral wall; mild ischemia in the mid-septum-> Med Rx.  . Chronic pain     Due to prior hip fracture and DDD back  . Peripheral vascular disease   . Pneumonia     "now and several times before" (07/26/2012)  . H/O hiatal hernia   . Stroke 2002; 2004    Denies  residual; "both really light strokes" (07/26/2012)  . Arthritis     "all over" (07/26/2012)  . DDD (degenerative disc disease), lumbosacral   . Depression   . Anxiety    Past Surgical History  Procedure Laterality Date  . Appendectomy    . Cholecystectomy    . Carpal tunnel release Bilateral   . Coronary angioplasty with stent placement      "1; total of 1" (07/26/2012)  . Total hip arthroplasty Right   . Hernia repair      "stomach" (07/26/2012)  . Abdominal hysterectomy    . Fracture surgery    . Foot fracture surgery Left   . Breast surgery Bilateral     benign tumors  . Tumor excision      "stomach and LLE" (07/26/2012)  . Back surgery    . Fixation kyphoplasty lumbar spine     Prior to Admission medications   Medication Sig Start Date End Date Taking? Authorizing Provider  albuterol (PROVENTIL HFA;VENTOLIN HFA) 108 (90 BASE) MCG/ACT inhaler Inhale 2 puffs into the lungs every 6 (six) hours as needed for wheezing. 12/17/12   Kinnie Feil, MD  albuterol (PROVENTIL) (2.5 MG/3ML) 0.083% nebulizer solution Take 3 mLs (2.5 mg total) by nebulization every 6 (six) hours as needed for wheezing. 12/17/12   Kinnie Feil, MD  ALPRAZolam (XANAX) 0.5 MG tablet Take 1 tablet (0.5 mg total) by mouth 4 (four) times daily. 04/05/13   Mary-Margaret Hassell Done, FNP  Alum & Mag Hydroxide-Simeth (MAGIC MOUTHWASH) SOLN Take 10 mL by mouth 4 (four) times daily. 12/02/12   Mary-Margaret Hassell Done, FNP  amoxicillin-clavulanate (AUGMENTIN) 875-125 MG per tablet Take 1 tablet by mouth 2 (two) times daily. 12/17/12   Kinnie Feil, MD  citalopram (CELEXA) 20 MG tablet Take 1 tablet (20 mg total) by mouth daily. 03/06/13   Mary-Margaret Hassell Done, FNP  cyclobenzaprine (FLEXERIL) 5 MG tablet TAKE 1 TABLET BY MOUTH TYID AS NEEDED FOR MUSCLE SPASMS 01/16/13   Mary-Margaret Hassell Done, FNP  docusate sodium 100 MG CAPS Take 100 mg by mouth 2 (two) times daily. 12/17/12   Kinnie Feil, MD  fenofibrate 160 MG tablet Take 1  tablet (160 mg total) by mouth daily. 12/01/12   Erby Pian, FNP  ferrous sulfate 325 (65 FE) MG tablet Take 1 tablet (325 mg total) by mouth 3 (three) times daily with meals. 12/17/12   Kinnie Feil, MD  Fluticasone-Salmeterol (ADVAIR) 250-50 MCG/DOSE AEPB Inhale 1 puff into the lungs every 12 (twelve) hours. 03/06/13   Mary-Margaret Hassell Done, FNP  folic acid (FOLVITE) 1 MG tablet Take 1 tablet (1 mg total) by mouth daily. 03/06/13   Mary-Margaret Hassell Done, FNP  furosemide (LASIX) 40 MG tablet Take 1 tablet (40 mg total) by mouth daily. 12/20/12   Kinnie Feil, MD  gabapentin (NEURONTIN) 300 MG capsule Take 300-600 mg by mouth 4 (four) times daily. Takes 1 capsule in morning, 1 capsule at noon, 1capsule in evening and 2 capsules at bedtime, every day.    Historical Provider, MD  gabapentin (NEURONTIN) 300 MG capsule TAKE 1 CAPSULE BY MOUTH IN THE MORNING ,1 AT NOON,1 IN PM AND 2 AT BEDTIME 03/28/13   Mae E Scott, FNP  ipratropium-albuterol (DUONEB) 0.5-2.5 (3) MG/3ML SOLN Take 3 mLs by nebulization 3 (three) times daily. For shortness of breath or wheezing 07/27/12   Mary-Margaret Hassell Done, FNP  isosorbide mononitrate (IMDUR) 30 MG 24 hr tablet Take 1 tablet (30 mg total) by mouth daily. 03/13/13   Satira Sark, MD  metoprolol tartrate (LOPRESSOR) 25 MG tablet Take 0.5 tablets (12.5 mg total) by mouth daily. 12/17/12   Kinnie Feil, MD  metoprolol tartrate (LOPRESSOR) 25 MG tablet TAKE 1/2 TABLET BY MOUTH DAILY 03/18/13   Mary-Margaret Hassell Done, FNP  montelukast (SINGULAIR) 10 MG tablet Take 1 tablet (10 mg total) by mouth daily. 03/06/13   Mary-Margaret Hassell Done, FNP  nitroGLYCERIN (NITROSTAT) 0.4 MG SL tablet Place 0.4 mg under the tongue every 5 (five) minutes as needed. For chest pain    Historical Provider, MD  nystatin (MYCOSTATIN) 100000 UNIT/ML suspension Take 5 mL (500,000 Units total) by mouth 4 (four) times daily. Use 48 hours after symptoms resolved 12/05/12   Erby Pian, FNP   omeprazole (PRILOSEC) 20 MG capsule Take 1 capsule (20 mg total) by mouth daily. 03/06/13   Mary-Margaret Hassell Done, FNP  ondansetron (ZOFRAN) 4 MG tablet TAKE 1 TABLET (4 MG TOTAL) BY MOUTH EVERY 6 (SIX) HOURS AS NEEDED FOR NAUSEA. 04/10/13   Erby Pian, FNP  oxyCODONE-acetaminophen (PERCOCET) 10-325 MG per tablet Take 1 tablet by mouth every 8 (eight) hours as needed for pain. 04/05/13   Mary-Margaret Hassell Done, FNP  polyethylene glycol (MIRALAX / GLYCOLAX) packet TAKE 17 G BY MOUTH DAILY. 01/16/13   Mary-Margaret Hassell Done, FNP  predniSONE (DELTASONE) 10 MG tablet  03/13/13   Historical Provider, MD  Rivaroxaban (XARELTO) 20 MG TABS tablet Take 1 tablet (20 mg total) by mouth daily. 04/05/13   Erby Pian, FNP  senna (SENOKOT) 8.6 MG TABS tablet Take 1 tablet (8.6 mg total) by mouth daily. 12/17/12   Kinnie Feil, MD  SPIRIVA HANDIHALER 18 MCG inhalation capsule  03/03/13   Historical Provider, MD  tobramycin-dexamethasone [facility]) ophthalmic solution Place 1 drop into both eyes every 4 (four) hours while awake. 02/20/13   Mary-Margaret Hassell Done, FNP   Allergies  Allergen Reactions  . Aspirin Other (See Comments)    Patient states no reaction, only told not to take while on other blood thinner  . Levofloxacin Hives  . Other Itching and Rash  . Penicillins Hives and Itching    FAMILY HISTORY:  Family History  Problem Relation Age of Onset  . Hypertension Mother   . Breast cancer Mother   . Rheum arthritis Maternal Grandmother   . Cancer Sister    SOCIAL HISTORY:  reports that she quit smoking about 8 months ago. Her smoking use included Cigarettes. She has a 55 pack-year smoking history. She has never used smokeless tobacco. She reports that she does not drink alcohol or use illicit drugs.  REVIEW OF SYSTEMS:  Negative except above.  SUBJECTIVE:   VITAL SIGNS: Temp:  [98.7 F (37.1 C)-102.4 F (39.1 C)] 102.4 F (39.1 C) (01/02 0133) Pulse Rate:  [71-141] 115 (01/02  0220) Resp:  [17-32] 17 (01/02 0225) BP: (77-208)/(42-184) 77/54 mmHg (01/02 0225) SpO2:  [92 %-100 %] 100 % (01/02 0220) FiO2 (%):  [50 %] 50 % (01/02 0210) Weight:  [135 lb (61.236 kg)] 135 lb (61.236 kg) (01/02 0115) HEMODYNAMICS:   VENTILATOR SETTINGS: Vent Mode:  [-]  FiO2 (%):  [50 %] 50 % INTAKE / OUTPUT: Intake/Output   None     PHYSICAL EXAMINATION: General: Ill appearing Neuro:  Alert, normal strength, follows commands HEENT:  Pupils reactive, BiPAP mask in place Cardiovascular:  Regular, tachycardic, no murmur Lungs:  Decreased breath sounds, faint wheeze, rales Lt base Abdomen:  Soft, decreased bowel sounds, non tender Musculoskeletal:  No edema Skin:  No rashes  LABS:  CBC  Recent Labs Lab 04/21/13 0110  WBC 20.2*  HGB 10.5*  HCT 32.6*  PLT 129*   Coag's No results found for this basename: APTT, INR,  in the last 168 hours BMET  Recent Labs Lab 04/21/13 0110  NA 142  K 4.5  CL 103  CO2 25  BUN 39*  CREATININE 1.37*  GLUCOSE 113*   Electrolytes  Recent Labs Lab 04/21/13 0110  CALCIUM 8.8   Sepsis Markers  Recent Labs Lab 04/21/13 0128  LATICACIDVEN 3.65*   ABG  Recent Labs Lab 04/21/13 0114  PHART 7.402  PCO2ART 41.1  PO2ART 149.0*   Liver Enzymes No results found for this basename: AST, ALT, ALKPHOS, BILITOT, ALBUMIN,  in the last 168 hours Cardiac Enzymes  Recent Labs Lab 04/21/13 0110  PROBNP 9278.0*   Glucose No results found for this basename: GLUCAP,  in the last 168 hours  Imaging Dg Chest Portable 1 View  04/21/2013   CLINICAL DATA:  Respiratory distress  EXAM: PORTABLE CHEST - 1 VIEW  COMPARISON:  12/13/2012  FINDINGS: Shallow inspiration. Heart size and pulmonary vascularity are normal. Persistent left lower lobe opacity suggesting atelectasis. A central obstructing lesion should be excluded due to the persistence of this finding. CT is recommended. No blunting of costophrenic angles. No  pneumothorax.  Calcification of the aorta. Previous thoracic kyphoplasty.  IMPRESSION: Persistent atelectasis in the left lung base. Consider CT to exclude a centrally obstructing lesion.   Electronically Signed   By: Lucienne Capers M.D.   On: 04/21/2013 01:28    ASSESSMENT / PLAN:  PULMONARY A: Acute respiratory failure 2nd to PNA and AECOPD. Hx of sleep apnea. P:   -continue BiPAP >> if now improvement then will need intubation -f/u CXR, ABG -scheduled BD's -solumedrol  CARDIOVASCULAR A:  Sepsis. Chronic systolic/diastolic CHF. Hx of CAD, hyperlipidemia, PVD. Hx of DVT on chronic xarelto. P:  -continue IV fluids, and f/u lactic acid >> if no improvement in BP, then will need CVL and pressors -monitor hemodynamics -hold outpt fenofibrate, lasix, imdur, lopressor, xarelto for now   RENAL A:   Acute kidney injury 2nd to hypotension, hypoxia >> baseline creatinine is 0.55 from 12/16/12. P:   -optimize hemodynamics, oxygenation -f/u renal fx, urine outpt, electrolytes -foley while critically ill  GASTROINTESTINAL A:   Nutrition. Hx of hiatal hernia. P:   -NPO except meds -protonix for SUP  HEMATOLOGIC A:   Anemia, thrombocytopenia of critical illness. Leukocytosis. P:  -f/u CBC  INFECTIOUS A:   PNA >> has recently been on Abx, and prednisone.  Hx of allergies to PCN and levaquin. P:   -droplet isolation -vancomycin, aztreonam, zithromax, tamiflu  ENDOCRINE A:   Steroid induced hyperglycemia.   P:   -SSI while on steroids  NEUROLOGIC A:   Hx of anxiety, depression, chronic pain. P:   -prn fentanyl  -hold outpt xanax, celexa, flexeril, neurontin, percocet for now  Updated pt's family at bedside.  CC time 45 minutes.  Chesley Mires, MD [facility] Pulmonary/Critical Care 04/21/2013, 3:01 AM Pager:  [phone] After 3pm call: [phone]

## 2013-04-21 NOTE — Procedures (Signed)
Central Venous Catheter Insertion Procedure Note ANNABELLA ELFORD [id number] 24-Oct-1945  Procedure: Insertion of Central Venous Catheter Indications: Drug and/or fluid administration  Procedure Details Consent: Risks of procedure as well as the alternatives and risks of each were explained to the (patient/caregiver).  Consent for procedure obtained. Time Out: Verified patient identification, verified procedure, site/side was marked, verified correct patient position, special equipment/implants available, medications/allergies/relevent history reviewed, required imaging and test results available.  Performed  Maximum sterile technique was used including antiseptics, cap, gloves, gown, hand hygiene, mask and sheet. Skin prep: Chlorhexidine; local anesthetic administered A antimicrobial bonded/coated triple lumen catheter was placed in the left internal jugular vein using the Seldinger technique.  Evaluation Blood flow good Complications: No apparent complications Patient did tolerate procedure well. Chest X-ray ordered to verify placement.  CXR: pending.  Chesley Mires, MD [facility] Pulmonary/Critical Care 04/21/2013, 4:41 AM Pager:  [phone] After 3pm call: [phone]

## 2013-04-21 NOTE — Progress Notes (Signed)
VASCULAR LAB PRELIMINARY  PRELIMINARY  PRELIMINARY  PRELIMINARY  Bilateral lower extremity venous duplex completed.    Preliminary report:  Limited study due to involuntary movement of the legs. There is no obvious evidence of acute DVT. Unable to visualize a chronic DVT In the right leg except a small area of the profunda vein. No obvious evidence of a superficial thrombus. Difficult to follow the greater saphenous vein. No evidence of a Baker's cyst.  Duanne Duchesne, RVS 04/21/2013, 1:09 PM

## 2013-04-21 NOTE — Progress Notes (Signed)
[facility] Progress Note Patient Name: Aimee Sanders DOB: [birth year] MRN: [id number]  Date of Service  04/21/2013   HPI/Events of Note   BP falling despite bolus  eICU Interventions  Start levophed drip   Intervention Category Major Interventions: Shock - evaluation and management  Asencion Noble 04/21/2013, 3:00 AM

## 2013-04-21 NOTE — ED Notes (Signed)
Radiology at bedside

## 2013-04-22 ENCOUNTER — Encounter (HOSPITAL_COMMUNITY): Payer: Self-pay | Admitting: Certified Registered Nurse Anesthetist

## 2013-04-22 ENCOUNTER — Inpatient Hospital Stay (HOSPITAL_COMMUNITY): Payer: PRIVATE HEALTH INSURANCE

## 2013-04-22 DIAGNOSIS — J9620 Acute and chronic respiratory failure, unspecified whether with hypoxia or hypercapnia: Secondary | ICD-10-CM

## 2013-04-22 DIAGNOSIS — I5043 Acute on chronic combined systolic (congestive) and diastolic (congestive) heart failure: Secondary | ICD-10-CM

## 2013-04-22 DIAGNOSIS — J441 Chronic obstructive pulmonary disease with (acute) exacerbation: Secondary | ICD-10-CM

## 2013-04-22 DIAGNOSIS — J189 Pneumonia, unspecified organism: Secondary | ICD-10-CM

## 2013-04-22 DIAGNOSIS — I509 Heart failure, unspecified: Secondary | ICD-10-CM

## 2013-04-22 LAB — GLUCOSE, CAPILLARY
GLUCOSE-CAPILLARY: 119 mg/dL — AB (ref 70–99)
Glucose-Capillary: 107 mg/dL — ABNORMAL HIGH (ref 70–99)
Glucose-Capillary: 121 mg/dL — ABNORMAL HIGH (ref 70–99)
Glucose-Capillary: 125 mg/dL — ABNORMAL HIGH (ref 70–99)
Glucose-Capillary: 131 mg/dL — ABNORMAL HIGH (ref 70–99)
Glucose-Capillary: 155 mg/dL — ABNORMAL HIGH (ref 70–99)

## 2013-04-22 LAB — CBC
HEMATOCRIT: 25.4 % — AB (ref 36.0–46.0)
Hemoglobin: 8.5 g/dL — ABNORMAL LOW (ref 12.0–15.0)
MCH: 31.6 pg (ref 26.0–34.0)
MCHC: 33.5 g/dL (ref 30.0–36.0)
MCV: 94.4 fL (ref 78.0–100.0)
Platelets: 85 10*3/uL — ABNORMAL LOW (ref 150–400)
RBC: 2.69 MIL/uL — AB (ref 3.87–5.11)
RDW: 17.1 % — ABNORMAL HIGH (ref 11.5–15.5)
WBC: 28.8 10*3/uL — ABNORMAL HIGH (ref 4.0–10.5)

## 2013-04-22 LAB — BASIC METABOLIC PANEL
BUN: 41 mg/dL — ABNORMAL HIGH (ref 6–23)
CHLORIDE: 110 meq/L (ref 96–112)
CO2: 22 meq/L (ref 19–32)
Calcium: 8 mg/dL — ABNORMAL LOW (ref 8.4–10.5)
Creatinine, Ser: 1.04 mg/dL (ref 0.50–1.10)
GFR calc Af Amer: 63 mL/min — ABNORMAL LOW (ref >90)
GFR calc non Af Amer: 54 mL/min — ABNORMAL LOW (ref >90)
GLUCOSE: 130 mg/dL — AB (ref 70–99)
Potassium: 3.6 mEq/L — ABNORMAL LOW (ref 3.7–5.3)
Sodium: 144 mEq/L (ref 137–147)

## 2013-04-22 LAB — PROCALCITONIN: PROCALCITONIN: 91.09 ng/mL

## 2013-04-22 MED ORDER — SODIUM CHLORIDE 0.9 % IJ SOLN
10.0000 mL | INTRAMUSCULAR | Status: DC | PRN
  Administered 2013-04-28 – 2013-04-29 (×2): 10 mL
  Administered 2013-04-30: 20 mL

## 2013-04-22 MED ORDER — SODIUM CHLORIDE 0.9 % IJ SOLN
10.0000 mL | Freq: Two times a day (BID) | INTRAMUSCULAR | Status: DC
  Administered 2013-04-22: 10 mL
  Administered 2013-04-22: 20 mL
  Administered 2013-04-23 (×2): 10 mL
  Administered 2013-04-24: 20 mL
  Administered 2013-04-25 (×2): 10 mL
  Administered 2013-04-28 – 2013-04-29 (×2): 20 mL
  Administered 2013-04-30: 10 mL

## 2013-04-22 MED ORDER — GUAIFENESIN 100 MG/5ML PO SYRP
200.0000 mg | ORAL_SOLUTION | ORAL | Status: DC | PRN
  Administered 2013-04-23 – 2013-04-27 (×5): 200 mg via ORAL
  Filled 2013-04-22 (×7): qty 10

## 2013-04-22 NOTE — Progress Notes (Signed)
Name: Aimee Sanders MRN: [id number] DOB: [birth year]    ADMISSION DATE:  04/21/2013  REFERRING MD :  Ripley Fraise  CHIEF COMPLAINT:  Can't breath  BRIEF PATIENT DESCRIPTION:  68 yo female former smoker presents with progressive dyspnea, cough, sputum, fever, and wheezing.  PCCM asked to admit to ICU for respiratory failure, PNA, sepsis, and AECOPD.   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   VITAL SIGNS: Temp:  [98.2 F (36.8 C)-99.3 F (37.4 C)] 98.2 F (36.8 C) (01/03 0700) Pulse Rate:  [87-110] 105 (01/03 0900) Resp:  [16-36] 18 (01/03 0900) BP: (95-125)/(51-91) 106/53 mmHg (01/03 0900) SpO2:  [97 %-100 %] 97 % (01/03 0900) FiO2 (%):  [40 %-50 %] 50 % (01/03 0820) Weight:  [68.3 kg (150 lb 9.2 oz)] 68.3 kg (150 lb 9.2 oz) (01/03 0400) HEMODYNAMICS: CVP:  [6 mmHg-15 mmHg] 9 mmHg VENTILATOR SETTINGS: Vent Mode:  [-] BIPAP FiO2 (%):  [40 %-50 %] 50 % Set Rate:  [14 bmp] 14 bmp PEEP:  [6 cmH20] 6 cmH20 Pressure Support:  [8 cmH20] 8 cmH20 INTAKE / OUTPUT: Intake/Output     01/02 0701 - 01/03 0700 01/03 0701 - 01/04 0700   P.O. 360    I.V. (mL/kg) 1322 (19.4) 100 (1.5)   IV Piggyback 600    Total Intake(mL/kg) 2282 (33.4) 100 (1.5)   Urine (mL/kg/hr) 1105 (0.7) 75 (0.4)   Total Output 1105 75   Net +1177 +25          PHYSICAL EXAMINATION: General: Ill appearing Neuro:  Alert, normal strength, follows commands HEENT:  Pupils reactive, [place] in place Cardiovascular:  Regular, tachycardic, no murmur Lungs:  Decreased breath sounds, faint wheeze, rales Lt base Abdomen:  Soft, decreased bowel sounds, non tender, large warts suprapubic  area Musculoskeletal:  RLE 2+ edema Skin:  No rashes  LABS:  CBC  Recent Labs Lab 04/21/13 0110 04/21/13 0251 04/22/13 0400  WBC 20.2* 18.6* 28.8*  HGB 10.5* 8.3* 8.5*  HCT 32.6* 26.5* 25.4*  PLT 129* 107* 85*   Coag's  Recent Labs Lab 04/21/13 0326  INR 1.84*   BMET  Recent Labs Lab 04/21/13 0110 04/21/13 0251 04/22/13 0400  NA 142 143 144  K 4.5 3.9 3.6*  CL 103 108 110  CO2 25 22 22   BUN 39* 36* 41*  CREATININE 1.37* 1.32* 1.04  GLUCOSE 113* 140* 130*   Electrolytes  Recent Labs Lab 04/21/13 0110 04/21/13 0251 04/22/13 0400  CALCIUM 8.8 7.1* 8.0*  MG  --  1.8  --   PHOS  --  2.2*  --    Sepsis Markers  Recent Labs Lab 04/21/13 0128 04/21/13 0238 04/21/13 0516 04/22/13 0400  LATICACIDVEN 3.65*  --  2.1  --   PROCALCITON  --  94.52  --  91.09   ABG  Recent Labs Lab 04/21/13 0114 04/21/13 0458  PHART 7.402 7.289*  PCO2ART 41.1 41.7  PO2ART 149.0* 59.0*   Liver Enzymes  Recent Labs Lab 04/21/13 0251  AST 14  ALT 11  ALKPHOS 88  BILITOT 0.3  ALBUMIN 1.4*   Cardiac Enzymes  Recent Labs Lab 04/21/13 0110  PROBNP 9278.0*   Glucose  Recent Labs Lab 04/21/13 1620 04/21/13 2006 04/21/13 2051 04/22/13 0011 04/22/13 0425 04/22/13 0800  GLUCAP 100* 68* 132* 107* 155* 131*    Imaging Dg Chest Port 1 View  04/22/2013   CLINICAL DATA:  Followup pneumonia  EXAM: PORTABLE CHEST - 1 VIEW  COMPARISON:  Prior chest x-ray 04/21/2013  FINDINGS: Stable left IJ approach central venous catheter. Catheter tip projects over the mid SVC. Unchanged enlargement of the cardiopericardial silhouette. Atherosclerotic calcifications are noted in the transverse aorta. Increasing pulmonary vascular congestion now with mild edema. Developing right lower lobe opacity. Persistent left layering pleural effusion with increasing basilar opacity. Multi level vertebral cement O plasties noted incidentally.  IMPRESSION: 1. Increased pulmonary vascular  congestion now with mild interstitial edema. 2. Increasing bibasilar opacities favored to reflect a combination of atelectasis and edema. Superimposed infection difficult to exclude entirely. 3. Stable small left pleural effusion.   Electronically Signed   By: Jacqulynn Cadet M.D.   On: 04/22/2013 08:34   Dg Chest Port 1 View  04/21/2013   CLINICAL DATA:  CVL placement  EXAM: PORTABLE CHEST - 1 VIEW  COMPARISON:  04/21/2013 at 0052 hr  FINDINGS: Technically limited study due to patient rotation. There has been placement of a left central venous catheter. The tip overlies the low SVC. No pneumothorax. Small left pleural effusion with basilar atelectasis or infiltration. The heart size and pulmonary vascularity not appear enlarged. Previous thoracic kyphoplasty is.  IMPRESSION: Limited study due to patient rotation. Central venous catheter tip appears to be located over the low SVC region. No pneumothorax.   Electronically Signed   By: Lucienne Capers M.D.   On: 04/21/2013 05:30   Dg Chest Portable 1 View  04/21/2013   CLINICAL DATA:  Respiratory distress  EXAM: PORTABLE CHEST - 1 VIEW  COMPARISON:  12/13/2012  FINDINGS: Shallow inspiration. Heart size and pulmonary vascularity are normal. Persistent left lower lobe opacity suggesting atelectasis. A central obstructing lesion should be excluded due to the persistence of this finding. CT is recommended. No blunting of costophrenic angles. No pneumothorax. Calcification of the aorta. Previous thoracic kyphoplasty.  IMPRESSION: Persistent atelectasis in the left lung base. Consider CT to exclude a centrally obstructing lesion.   Electronically Signed   By: Lucienne Capers M.D.   On: 04/21/2013 01:28    ASSESSMENT / PLAN:  PULMONARY A: Acute respiratory failure 2nd to PNA and AECOPD. Hx of sleep apnea. P:   -continue BiPAP prn>> transition to [place] -scheduled BD's -solumedrol, goal transition to pred 1/4   CARDIOVASCULAR A:  Sepsis. Chronic  systolic/diastolic CHF. Hx of CAD, hyperlipidemia, PVD. Hx of DVT on chronic xarelto. Prelim report duplex 1/2 >> no obvious DVT P:  -Off  pressors -monitor hemodynamics -hold outpt fenofibrate, lasix, imdur, lopressor, xarelto for now   RENAL A:   Acute kidney injury 2nd to hypotension, hypoxia >> baseline creatinine is 0.55 from 12/16/12. P:   -optimize hemodynamics, oxygenation -f/u renal fx, urine outpt, electrolytes -foley while critically ill  GASTROINTESTINAL A:   Nutrition. Hx of hiatal hernia. P:   -try PO diet 1/3, advance as tolerated -protonix for SUP  HEMATOLOGIC A:   Anemia, thrombocytopenia of critical illness. Leukocytosis. P:  -f/u CBC  INFECTIOUS A:   PNA >> has recently been on Abx, and prednisone.  Hx of allergies to PCN and levaquin. P:   -droplet isolation d/c'd, flu negative -vancomycin, aztreonam, zithromax -D/c tamiflu  ENDOCRINE A:   Steroid  induced hyperglycemia.  Subsequent hypoglycemia P:   -SSI while on steroids  NEUROLOGIC A:   Hx of anxiety, depression, chronic pain. P:   -prn fentanyl  -hold outpt xanax, celexa, flexeril, neurontin, percocet for now  Baltazar Apo, MD, PhD 04/22/2013, 10:10 AM [facility] [phone] or if no answer [phone]

## 2013-04-22 NOTE — Progress Notes (Signed)
Pt removed from BiPaP and placed on 50% Venti mask. Pt tolerating titration well at this time. Sats 100, RR 21. RT/RN will continue to titrated O2. RT will continue to monitor.

## 2013-04-23 DIAGNOSIS — A419 Sepsis, unspecified organism: Secondary | ICD-10-CM

## 2013-04-23 LAB — CULTURE, BLOOD (ROUTINE X 2)

## 2013-04-23 LAB — BASIC METABOLIC PANEL
BUN: 45 mg/dL — ABNORMAL HIGH (ref 6–23)
CHLORIDE: 106 meq/L (ref 96–112)
CO2: 23 meq/L (ref 19–32)
Calcium: 8.6 mg/dL (ref 8.4–10.5)
Creatinine, Ser: 0.95 mg/dL (ref 0.50–1.10)
GFR calc non Af Amer: 60 mL/min — ABNORMAL LOW (ref >90)
GFR, EST AFRICAN AMERICAN: 70 mL/min — AB (ref >90)
Glucose, Bld: 157 mg/dL — ABNORMAL HIGH (ref 70–99)
POTASSIUM: 4.3 meq/L (ref 3.7–5.3)
SODIUM: 139 meq/L (ref 137–147)

## 2013-04-23 LAB — CBC
HCT: 25.9 % — ABNORMAL LOW (ref 36.0–46.0)
HEMOGLOBIN: 8.5 g/dL — AB (ref 12.0–15.0)
MCH: 30.9 pg (ref 26.0–34.0)
MCHC: 32.8 g/dL (ref 30.0–36.0)
MCV: 94.2 fL (ref 78.0–100.0)
PLATELETS: 96 10*3/uL — AB (ref 150–400)
RBC: 2.75 MIL/uL — AB (ref 3.87–5.11)
RDW: 16.9 % — ABNORMAL HIGH (ref 11.5–15.5)
WBC: 17.2 10*3/uL — AB (ref 4.0–10.5)

## 2013-04-23 LAB — MAGNESIUM: MAGNESIUM: 2.4 mg/dL (ref 1.5–2.5)

## 2013-04-23 LAB — GLUCOSE, CAPILLARY
GLUCOSE-CAPILLARY: 154 mg/dL — AB (ref 70–99)
GLUCOSE-CAPILLARY: 97 mg/dL (ref 70–99)
Glucose-Capillary: 105 mg/dL — ABNORMAL HIGH (ref 70–99)
Glucose-Capillary: 106 mg/dL — ABNORMAL HIGH (ref 70–99)
Glucose-Capillary: 109 mg/dL — ABNORMAL HIGH (ref 70–99)
Glucose-Capillary: 100 mg/dL — ABNORMAL HIGH (ref 70–99)

## 2013-04-23 LAB — URINE CULTURE

## 2013-04-23 LAB — PROCALCITONIN: Procalcitonin: 50.86 ng/mL

## 2013-04-23 LAB — PHOSPHORUS: Phosphorus: 3.6 mg/dL (ref 2.3–4.6)

## 2013-04-23 MED ORDER — ALPRAZOLAM 0.5 MG PO TABS
0.5000 mg | ORAL_TABLET | Freq: Once | ORAL | Status: AC
  Administered 2013-04-23: 0.5 mg via ORAL
  Filled 2013-04-23: qty 1

## 2013-04-23 MED ORDER — POLYETHYLENE GLYCOL 3350 17 G PO PACK
17.0000 g | PACK | Freq: Every day | ORAL | Status: DC
  Administered 2013-04-23 – 2013-04-30 (×4): 17 g via ORAL
  Filled 2013-04-23 (×8): qty 1

## 2013-04-23 MED ORDER — PREDNISONE 20 MG PO TABS
40.0000 mg | ORAL_TABLET | Freq: Every day | ORAL | Status: DC
  Administered 2013-04-24 – 2013-04-30 (×7): 40 mg via ORAL
  Filled 2013-04-23 (×8): qty 2

## 2013-04-23 MED ORDER — PANTOPRAZOLE SODIUM 40 MG PO TBEC
40.0000 mg | DELAYED_RELEASE_TABLET | Freq: Every day | ORAL | Status: DC
  Administered 2013-04-23 – 2013-04-30 (×8): 40 mg via ORAL
  Filled 2013-04-23 (×8): qty 1

## 2013-04-23 NOTE — Evaluation (Signed)
Physical Therapy Evaluation Patient Details Name: Aimee Sanders MRN: [id number] DOB: 03/06/1946 Today's Date: 04/23/2013 Time: 4818-5631 PT Time Calculation (min): 25 min  PT Assessment / Plan / Recommendation History of Present Illness  Pt admitted with dx of PNA.  Pt with PMH of CHF, COPD, and DVT.  Clinical Impression  Pt admitted with PNA. Pt currently with functional limitations due to the deficits listed below (see PT Problem List).  Pt will benefit from skilled PT to increase their independence and safety with mobility to allow discharge to the venue listed below.       PT Assessment  Patient needs continued PT services    Follow Up Recommendations  Home health PT    Does the patient have the potential to tolerate intense rehabilitation      Barriers to Discharge        Equipment Recommendations  None recommended by PT    Recommendations for Other Services     Frequency Min 3X/week    Precautions / Restrictions Precautions Precautions: Fall   Pertinent Vitals/Pain 6/10 headache      Mobility  Bed Mobility Bed Mobility: Sit to Supine Sit to Supine: 3: Mod assist Details for Bed Mobility Assistance: verbal cues for sequencing Transfers Transfers: Sit to Stand;Stand to Sit;Stand Pivot Transfers Sit to Stand: 4: Min assist;From chair/3-in-1;With armrests Stand to Sit: 4: Min assist;To bed;With upper extremity assist Stand Pivot Transfers: 1: +2 Total assist (with RW) Stand Pivot Transfers: Patient Percentage: 60% Details for Transfer Assistance: verbal/tactile cues for sequencing, safety    Exercises     PT Diagnosis: Difficulty walking;Generalized weakness  PT Problem List: Decreased strength;Decreased activity tolerance;Decreased balance;Decreased mobility PT Treatment Interventions: Gait training;Stair training;Functional mobility training;Therapeutic activities;Therapeutic exercise;Balance training;Patient/family education     PT Goals(Current goals  can be found in the care plan section) Acute Rehab PT Goals Patient Stated Goal: home PT Goal Formulation: With patient Time For Goal Achievement: 05/07/13 Potential to Achieve Goals: Good  Visit Information  Last PT Received On: 04/23/13 Assistance Needed: +2 History of Present Illness: Pt admitted with dx of PNA.  Pt with PMH of CHF, COPD, and DVT.       Prior [place] expects to be discharged to:: Private residence Living Arrangements: Spouse/significant other;Children Available Help at Discharge: Family;Available 24 hours/day Type of Home: Mobile home Home Access: Stairs to enter Entrance Stairs-Number of Steps: 10 Entrance Stairs-Rails: Right;Left;Can reach both Home Layout: One level Home Equipment: Walker - 2 wheels;Cane - single point;Bedside commode;Shower seat;Grab bars - toilet;Grab bars - tub/shower Prior Function Level of Independence: Independent with assistive device(s) Communication Communication: HOH Dominant Hand: Right    Cognition  Cognition Arousal/Alertness: Awake/alert Behavior During Therapy: WFL for tasks assessed/performed Overall Cognitive Status: Within Functional Limits for tasks assessed    Extremity/Trunk Assessment     Balance    End of Session PT - End of Session Equipment Utilized During Treatment: Gait belt Activity Tolerance: Patient limited by fatigue Patient left: in bed;with call bell/phone within reach Nurse Communication: Mobility status;Patient requests pain meds  GP     Lorriane Shire 04/23/2013, 1:10 PM  Lorrin Goodell, PT  Office # [phone] Pager [phone]

## 2013-04-23 NOTE — Progress Notes (Signed)
PULMONARY / CRITICAL CARE MEDICINE   Name: Aimee Sanders MRN: [id number] DOB: 08-Jul-1945    ADMISSION DATE:  04/21/2013  REFERRING MD :  EDP  CHIEF COMPLAINT:  Dyspnea  BRIEF PATIENT DESCRIPTION: 68 yo former smoker with COPD, OSA and DVT on Xarelto admitted with respiratory failure, pneumonia, sepsis, and AECOPD.    SIGNIFICANT EVENTS / STUDIES: 1/2  Venous Doppler LE >>> Technically limited study. There is a possible chronic DVT noted in a small area of the right profunda vein.  LINES / TUBES: L IJ CVL  1/2 >>> Foley 1/2 >>> 1/5  CULTURES: 1/2 Blood  >>> E. COLI 1/2 Influenza PCR >>> neg 1/2 Pneumococcal Ag  >>> neg  1/2 Urine >>> E.COLI  ANTIBIOTICS: Vancomycin 1/2 >>> 1/4 Azithromycin 1/2 >>> 1/5 Cefepime 1/2 >>> 1/5 Tamiflu 1/2 >>> 1/3 Ceftriaxone 1/5 >>>  SUBJECTIVE:  No overnight issues. Off BiPAP.  VITAL SIGNS: Temp:  [97.3 F (36.3 C)-98.3 F (36.8 C)] 97.9 F (36.6 C) (01/05 0759) Pulse Rate:  [87-105] 87 (01/05 1100) Resp:  [14-27] 24 (01/05 1100) BP: (115-153)/(62-84) 135/70 mmHg (01/05 1100) SpO2:  [94 %-100 %] 96 % (01/05 1100) Weight:  [68.7 kg (151 lb 7.3 oz)] 68.7 kg (151 lb 7.3 oz) (01/05 0600)  VENTILATOR SETTINGS:   INTAKE / OUTPUT: Intake/Output     01/04 0701 - 01/05 0700 01/05 0701 - 01/06 0700   P.O. 400    I.V. (mL/kg) 500 (7.3) 80 (1.2)   IV Piggyback 300 50   Total Intake(mL/kg) 1200 (17.5) 130 (1.9)   Urine (mL/kg/hr) 1200 (0.7) 205 (0.7)   Total Output 1200 205   Net 0 -75          PHYSICAL EXAMINATION: General: No distress Neuro:  Sleepy, but arouses to stimulation HEENT:  PERRL Cardiovascular:  Regular, no murmurs Lungs:  Bilateral air entry, no wheezing Abdomen:  Soft, non tender, bowel sounds present Musculoskeletal:  RLE edema Skin:  No rashes  LABS:  CBC  Recent Labs Lab 04/22/13 0400 04/23/13 0400 04/24/13 0411  WBC 28.8* 17.2* 10.3  HGB 8.5* 8.5* 8.4*  HCT 25.4* 25.9* 25.9*  PLT 85* 96* 114*    Coag's  Recent Labs Lab 04/21/13 0326  INR 1.84*   BMET  Recent Labs Lab 04/22/13 0400 04/23/13 0400 04/24/13 0411  NA 144 139 139  K 3.6* 4.3 4.0  CL 110 106 104  CO2 22 23 26   BUN 41* 45* 40*  CREATININE 1.04 0.95 0.89  GLUCOSE 130* 157* 123*   Electrolytes  Recent Labs Lab 04/21/13 0251 04/22/13 0400 04/23/13 0400 04/24/13 0411  CALCIUM 7.1* 8.0* 8.6 8.8  MG 1.8  --  2.4  --   PHOS 2.2*  --  3.6  --    Sepsis Markers  Recent Labs Lab 04/21/13 0128 04/21/13 0238 04/21/13 0516 04/22/13 0400 04/23/13 0400  LATICACIDVEN 3.65*  --  2.1  --   --   PROCALCITON  --  94.52  --  91.09 50.86   ABG  Recent Labs Lab 04/21/13 0114 04/21/13 0458  PHART 7.402 7.289*  PCO2ART 41.1 41.7  PO2ART 149.0* 59.0*   Liver Enzymes  Recent Labs Lab 04/21/13 0251  AST 14  ALT 11  ALKPHOS 88  BILITOT 0.3  ALBUMIN 1.4*   Cardiac Enzymes  Recent Labs Lab 04/21/13 0110  PROBNP 9278.0*   Glucose  Recent Labs Lab 04/23/13 1136 04/23/13 1726 04/23/13 2028 04/24/13 0022 04/24/13 0408 04/24/13 0756  GLUCAP 106* 109*  100* 109* 120* 87   CXR:  1/5 >>> Bibasilar effusions / atelectasis.  ASSESSMENT / PLAN:  PULMONARY A: Acute respiratory failure. CAP. AECOPD. H/o OSA. P:   Goal SpO2>92 Supplemental oxygen PRN Prednisone 40 daily Albuterol / Atrovent  CARDIOVASCULAR A:  Sepsis secondary to UTI / bacteremia. Chronic systolic / diastolic CHF. Chronic DVT on Xarelto. Hx of CAD, hyperlipidemia, PVD. P:  Goal MAP > 65 Restart Fenofibrate, Imdur, Lopressor   RENAL A:   AKI. P:   Trend BMP D/c Foley Restart Lasix  GASTROINTESTINAL A:   Nutrition. H/o hiatal hernia on Prilosec. P:   Diet Protonix  HEMATOLOGIC A:   Anemia, thrombocytopenia of critical illness. Leukocytosis. Therapeutic anticoagulation for DVT. P:  Trend CBC Restart Xarelto per pharmacy  INFECTIOUS A:   Suspected CAP. UTI / Bacteremia with E.  Coli. Allergy to PCN and Levaquin.  P:   Abx / cx as above Simplify to Ceftriaxone   ENDOCRINE A:   Steroid induced hyperglycemia.   Hypoglycemia episode. P:   SSI  NEUROLOGIC A:   Hx of anxiety, depression, chronic pain. P:   Fentanyl PRN Hold Xanax, Flexeril, Neurontin, Percocet Restart Celexa  Transfer to medical floor. PCCM will sign off. TRH to assume care 1/6.  I have personally obtained history, examined patient, evaluated and interpreted laboratory and imaging results, reviewed medical records, formulated assessment / plan and placed orders.  Doree Fudge, MD [facility] [place] Pager: [phone]  04/24/2013, 11:33 AM

## 2013-04-23 NOTE — Progress Notes (Signed)
Name: Aimee Sanders MRN: [id number] DOB: [birth year]    ADMISSION DATE:  04/21/2013  REFERRING MD :  Ripley Fraise  CHIEF COMPLAINT:  Can't breath  BRIEF PATIENT DESCRIPTION:  68 y/o female former smoker presents with progressive dyspnea, cough, sputum, fever, and wheezing.  PCCM asked to admit to ICU for respiratory failure, PNA, sepsis, and AECOPD.    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  No other acute events.   VITAL SIGNS: Temp:  [97.2 F (36.2 C)-98.7 F (37.1 C)] 98.2 F (36.8 C) (01/04 0800) Pulse Rate:  [85-104] 104 (01/04 1000) Resp:  [18-28] 18 (01/04 1000) BP: (110-130)/(58-91) 122/63 mmHg (01/04 0900) SpO2:  [96 %-100 %] 97 % (01/04 1000) FiO2 (%):  [35 %-40 %] 35 % (01/03 2000)  VENTILATOR SETTINGS: Vent Mode:  [-]  FiO2 (%):  [35 %-40 %] 35 % INTAKE / OUTPUT: Intake/Output     01/03 0701 - 01/04 0700 01/04 0701 - 01/05 0700   P.O. 840    I.V. (mL/kg) 536 (7.8) 80 (1.2)   IV Piggyback 500    Total Intake(mL/kg) 1876 (27.5) 80 (1.2)   Urine (mL/kg/hr) 1120 (0.7) 165 (0.6)   Total Output 1120 165   Net +756 -85          PHYSICAL EXAMINATION: General: adult female in NAD Neuro:  Alert, normal strength, follows commands HEENT:  Pupils reactive, [place] in place Cardiovascular:  Regular, tachycardic, no murmur Lungs:  Decreased breath sounds, faint wheeze, rales Lt base Abdomen:  Soft, decreased bowel sounds, non tender, large warts suprapubic area Musculoskeletal:  RLE 2+ edema Skin:  No rashes  LABS:  CBC  Recent Labs Lab 04/21/13 0251  04/22/13 0400 04/23/13 0400  WBC 18.6* 28.8* 17.2*  HGB 8.3* 8.5* 8.5*  HCT 26.5* 25.4* 25.9*  PLT 107* 85* 96*   Coag's  Recent Labs Lab 04/21/13 0326  INR 1.84*   BMET  Recent Labs Lab 04/21/13 0251 04/22/13 0400 04/23/13 0400  NA 143 144 139  K 3.9 3.6* 4.3  CL 108 110 106  CO2 22 22 23   BUN 36* 41* 45*  CREATININE 1.32* 1.04 0.95  GLUCOSE 140* 130* 157*   Electrolytes  Recent Labs Lab 04/21/13 0251 04/22/13 0400 04/23/13 0400  CALCIUM 7.1* 8.0* 8.6  MG 1.8  --  2.4  PHOS 2.2*  --  3.6   Sepsis Markers  Recent Labs Lab 04/21/13 0128 04/21/13 0238 04/21/13 0516 04/22/13 0400 04/23/13 0400  LATICACIDVEN 3.65*  --  2.1  --   --   PROCALCITON  --  94.52  --  91.09 50.86   ABG  Recent Labs Lab 04/21/13 0114 04/21/13 0458  PHART 7.402 7.289*  PCO2ART 41.1 41.7  PO2ART 149.0* 59.0*   Liver Enzymes  Recent Labs Lab 04/21/13 0251  AST 14  ALT 11  ALKPHOS 88  BILITOT 0.3  ALBUMIN 1.4*   Cardiac Enzymes  Recent Labs Lab 04/21/13 0110  PROBNP 9278.0*   Glucose  Recent Labs Lab 04/22/13 1219 04/22/13 1625 04/22/13 2112 04/23/13 0022 04/23/13 0343 04/23/13 [place]  119* 121* 125* 97 154* 105*    Imaging Dg Chest Port 1 View  04/22/2013   CLINICAL DATA:  Followup pneumonia  EXAM: PORTABLE CHEST - 1 VIEW  COMPARISON:  Prior chest x-ray 04/21/2013  FINDINGS: Stable left IJ approach central venous catheter. Catheter tip projects over the mid SVC. Unchanged enlargement of the cardiopericardial silhouette. Atherosclerotic calcifications are noted in the transverse aorta. Increasing pulmonary vascular congestion now with mild edema. Developing right lower lobe opacity. Persistent left layering pleural effusion with increasing basilar opacity. Multi level vertebral cement O plasties noted incidentally.  IMPRESSION: 1. Increased pulmonary vascular congestion now with mild interstitial edema. 2. Increasing bibasilar opacities favored to  reflect a combination of atelectasis and edema. Superimposed infection difficult to exclude entirely. 3. Stable small left pleural effusion.   Electronically Signed   By: Jacqulynn Cadet M.D.   On: 04/22/2013 08:34    ASSESSMENT / PLAN:  PULMONARY A: Acute respiratory failure 2nd to PNA and AECOPD. Hx of sleep apnea. P:   -oxygen to keep sats 90-95% -scheduled BD's -transition to pred 1/4 -assess ambulatory desat prior to discharge if not weaned off O2 -CXR PRN   CARDIOVASCULAR A:  Sepsis - in setting of E-Coli UTI / Bacteremia, AECOPD, PNA Chronic systolic/diastolic CHF. Hx of CAD, hyperlipidemia, PVD. Hx of DVT on chronic xarelto -Prelim report duplex 1/2 >> no obvious DVT P:  -monitor hemodynamics -hold outpt fenofibrate, lasix, imdur, lopressor  -consider restart xaralto?  RENAL A:   Acute kidney injury 2nd to hypotension, hypoxia >> baseline creatinine is 0.55 from 12/16/12. P:   -optimize hemodynamics, oxygenation -f/u renal fx, urine outpt, electrolytes -consider d/c foley in am 1/5  GASTROINTESTINAL A:   Nutrition. Hx of hiatal hernia. P:   -Advance as tolerated -protonix for SUP  HEMATOLOGIC A:   Anemia, thrombocytopenia of critical illness. Leukocytosis. P:  -f/u CBC  INFECTIOUS A:   PNA >> has recently been on Abx, and prednisone.  Hx of allergies to PCN and levaquin. E-Coli UTI / Bacteremia - pan sensitive  P:   -droplet isolation d/c'd, flu negative -aztreonam, zithromax -d/c vancomycin   ENDOCRINE A:   Steroid induced hyperglycemia.  Subsequent hypoglycemia P:   -SSI while on steroids  NEUROLOGIC A:   Hx of anxiety, depression, chronic pain. P:   -prn fentanyl  -hold outpt xanax, celexa, flexeril, neurontin, percocet for now   Transfer patient to SDU.    Noe Gens, NP-C [facility] Pgr: [phone] or [phone]    Baltazar Apo, MD, PhD 04/23/2013, 12:11 PM [facility] [phone] or if no answer [phone]

## 2013-04-24 ENCOUNTER — Inpatient Hospital Stay (HOSPITAL_COMMUNITY): Payer: PRIVATE HEALTH INSURANCE

## 2013-04-24 DIAGNOSIS — D649 Anemia, unspecified: Secondary | ICD-10-CM

## 2013-04-24 LAB — GLUCOSE, CAPILLARY
GLUCOSE-CAPILLARY: 102 mg/dL — AB (ref 70–99)
GLUCOSE-CAPILLARY: 109 mg/dL — AB (ref 70–99)
GLUCOSE-CAPILLARY: 120 mg/dL — AB (ref 70–99)
GLUCOSE-CAPILLARY: 35 mg/dL — AB (ref 70–99)
Glucose-Capillary: 87 mg/dL (ref 70–99)
Glucose-Capillary: 101 mg/dL — ABNORMAL HIGH (ref 70–99)
Glucose-Capillary: 120 mg/dL — ABNORMAL HIGH (ref 70–99)
Glucose-Capillary: 88 mg/dL (ref 70–99)

## 2013-04-24 LAB — BASIC METABOLIC PANEL
BUN: 40 mg/dL — AB (ref 6–23)
CHLORIDE: 104 meq/L (ref 96–112)
CO2: 26 mEq/L (ref 19–32)
Calcium: 8.8 mg/dL (ref 8.4–10.5)
Creatinine, Ser: 0.89 mg/dL (ref 0.50–1.10)
GFR calc Af Amer: 75 mL/min — ABNORMAL LOW (ref >90)
GFR, EST NON AFRICAN AMERICAN: 65 mL/min — AB (ref >90)
Glucose, Bld: 123 mg/dL — ABNORMAL HIGH (ref 70–99)
POTASSIUM: 4 meq/L (ref 3.7–5.3)
SODIUM: 139 meq/L (ref 137–147)

## 2013-04-24 LAB — CBC
HEMATOCRIT: 25.9 % — AB (ref 36.0–46.0)
HEMOGLOBIN: 8.4 g/dL — AB (ref 12.0–15.0)
MCH: 30.5 pg (ref 26.0–34.0)
MCHC: 32.4 g/dL (ref 30.0–36.0)
MCV: 94.2 fL (ref 78.0–100.0)
Platelets: 114 10*3/uL — ABNORMAL LOW (ref 150–400)
RBC: 2.75 MIL/uL — ABNORMAL LOW (ref 3.87–5.11)
RDW: 16.2 % — ABNORMAL HIGH (ref 11.5–15.5)
WBC: 10.3 10*3/uL (ref 4.0–10.5)

## 2013-04-24 MED ORDER — CITALOPRAM HYDROBROMIDE 20 MG PO TABS
20.0000 mg | ORAL_TABLET | Freq: Every day | ORAL | Status: DC
  Administered 2013-04-24 – 2013-04-30 (×7): 20 mg via ORAL
  Filled 2013-04-24 (×7): qty 1

## 2013-04-24 MED ORDER — METOPROLOL TARTRATE 12.5 MG HALF TABLET
12.5000 mg | ORAL_TABLET | Freq: Every day | ORAL | Status: DC
  Administered 2013-04-24 – 2013-04-30 (×7): 12.5 mg via ORAL
  Filled 2013-04-24 (×7): qty 1

## 2013-04-24 MED ORDER — DEXTROSE 5 % IV SOLN
2.0000 g | INTRAVENOUS | Status: DC
  Administered 2013-04-25 – 2013-04-28 (×4): 2 g via INTRAVENOUS
  Filled 2013-04-24 (×4): qty 2

## 2013-04-24 MED ORDER — FENOFIBRATE 160 MG PO TABS
160.0000 mg | ORAL_TABLET | Freq: Every day | ORAL | Status: DC
  Administered 2013-04-24 – 2013-04-30 (×7): 160 mg via ORAL
  Filled 2013-04-24 (×7): qty 1

## 2013-04-24 MED ORDER — RIVAROXABAN 20 MG PO TABS
20.0000 mg | ORAL_TABLET | Freq: Every day | ORAL | Status: DC
  Administered 2013-04-24 – 2013-04-29 (×6): 20 mg via ORAL
  Filled 2013-04-24 (×7): qty 1

## 2013-04-24 MED ORDER — FENTANYL CITRATE 0.05 MG/ML IJ SOLN
25.0000 ug | Freq: Once | INTRAMUSCULAR | Status: AC
  Administered 2013-04-25: 25 ug via INTRAVENOUS

## 2013-04-24 MED ORDER — INSULIN ASPART 100 UNIT/ML ~~LOC~~ SOLN
0.0000 [IU] | Freq: Three times a day (TID) | SUBCUTANEOUS | Status: DC
  Administered 2013-04-25: 2 [IU] via SUBCUTANEOUS
  Administered 2013-04-27: 5 [IU] via SUBCUTANEOUS
  Administered 2013-04-28 – 2013-04-29 (×2): 3 [IU] via SUBCUTANEOUS
  Administered 2013-04-29: 2 [IU] via SUBCUTANEOUS

## 2013-04-24 MED ORDER — MAGIC MOUTHWASH W/LIDOCAINE
10.0000 mL | Freq: Four times a day (QID) | ORAL | Status: DC | PRN
  Administered 2013-04-24 – 2013-04-26 (×6): 10 mL via ORAL
  Filled 2013-04-24 (×5): qty 10

## 2013-04-24 MED ORDER — ISOSORBIDE MONONITRATE ER 30 MG PO TB24
30.0000 mg | ORAL_TABLET | Freq: Every day | ORAL | Status: DC
  Administered 2013-04-24 – 2013-04-30 (×7): 30 mg via ORAL
  Filled 2013-04-24 (×7): qty 1

## 2013-04-24 MED ORDER — DEXTROSE 5 % IV SOLN
2.0000 g | Freq: Two times a day (BID) | INTRAVENOUS | Status: DC
  Administered 2013-04-24: 2 g via INTRAVENOUS
  Filled 2013-04-24 (×2): qty 2

## 2013-04-24 MED ORDER — FUROSEMIDE 40 MG PO TABS
40.0000 mg | ORAL_TABLET | Freq: Every day | ORAL | Status: DC
  Administered 2013-04-25 – 2013-04-30 (×6): 40 mg via ORAL
  Filled 2013-04-24 (×6): qty 1

## 2013-04-24 MED ORDER — INSULIN ASPART 100 UNIT/ML ~~LOC~~ SOLN: 0.0000 [IU] | Freq: Every day | SUBCUTANEOUS | Status: DC

## 2013-04-24 NOTE — Progress Notes (Signed)
Pt transferred to 2W14. Pt assisted from wheelchair to bed, placed on 2 L O2, and placed on bed alarm. Call bell within reach. Pt's husband and daughter called and informed of new room number.  Vella Raring, RN

## 2013-04-24 NOTE — Progress Notes (Addendum)
ANTIBIOTIC/ANTICOAGULATION CONSULT NOTE - FOLLOW UP  Pharmacy Consult for Cefepime>>change to Ceftriaxone, resume Xarelto Indication: Ecoli bacteremia/UTI, atrial fibrillation  Allergies  Allergen Reactions  . Aspirin Other (See Comments)    Patient states no reaction, only told not to take while on other blood thinner  . Levofloxacin Hives  . Other Itching and Rash  . Penicillins Hives and Itching    Patient Measurements: Height: 5' (152.4 cm) Weight: 151 lb 7.3 oz (68.7 kg) IBW/kg (Calculated) : 45.5  Vital Signs: Temp: 97.9 F (36.6 C) (01/05 0759) Temp src: Oral (01/05 0759) BP: 142/73 mmHg (01/05 0800) Pulse Rate: 100 (01/05 0900) Intake/Output from previous day: 01/04 0701 - 01/05 0700 In: 1200 [P.O.:400; I.V.:500; IV Piggyback:300] Out: 1200 [Urine:1200] Intake/Output from this shift: Total I/O In: 40 [I.V.:40] Out: 90 [Urine:90]  Labs:  Recent Labs  04/22/13 0400 04/23/13 0400 04/24/13 0411  WBC 28.8* 17.2* 10.3  HGB 8.5* 8.5* 8.4*  PLT 85* 96* 114*  CREATININE 1.04 0.95 0.89   Estimated Creatinine Clearance: 52.3 ml/min (by C-G formula based on Cr of 0.89).  Assessment: 67yof continues on day#4 cefepime for Ecoli in her blood and urine. Renal function continues to improve. Will adjust dose.  Vanc 1/2 >>1/4 Cefepime 1/2>> Azithromycin 1/2 >> tamiflu 1/2 >>1/3  1/2 bld x2 - e.coli-pan sen 1/2 urine - e.coli-pan sen  Goal of Therapy:  Appropriate cefepime dosing  Plan:  1) Increase cefepime to 2g IV q12 2) Continue to follow renal function  Deboraha Sprang 04/24/2013,10:25 AM  Pharmacy has been asked to change cefepime to ceftriaxone, as well as resume xarelto for her afib.   1) Ceftriaxone 2g IV q24 2) Xarelto 20mg  po daily with supper  Deboraha Sprang 04/24/2013, 11:50 AM

## 2013-04-24 NOTE — Progress Notes (Signed)
eLink Physician-Brief Progress Note Patient Name: Aimee Sanders DOB: 07/25/1945 MRN: [id number]  Date of Service  04/24/2013   HPI/Events of Note  Patient transferred to tele bed today.  Now c/o of 10/10 severe diffuse pain.  Has only tylenol ordered but h/o of oversedation with many sedating home meds.  HD stable currently.   eICU Interventions  Plan: One time dose of 25 mcg fentanyl IV.   Intervention Category Intermediate Interventions: Pain - evaluation and management  DETERDING,ELIZABETH 04/24/2013, 11:59 PM

## 2013-04-25 DIAGNOSIS — R7881 Bacteremia: Secondary | ICD-10-CM

## 2013-04-25 DIAGNOSIS — A419 Sepsis, unspecified organism: Secondary | ICD-10-CM | POA: Diagnosis present

## 2013-04-25 DIAGNOSIS — B9620 Unspecified Escherichia coli [E. coli] as the cause of diseases classified elsewhere: Secondary | ICD-10-CM | POA: Diagnosis present

## 2013-04-25 DIAGNOSIS — N390 Urinary tract infection, site not specified: Secondary | ICD-10-CM

## 2013-04-25 DIAGNOSIS — H5500 Unspecified nystagmus: Secondary | ICD-10-CM

## 2013-04-25 DIAGNOSIS — B002 Herpesviral gingivostomatitis and pharyngotonsillitis: Secondary | ICD-10-CM | POA: Clinically undetermined

## 2013-04-25 DIAGNOSIS — A498 Other bacterial infections of unspecified site: Secondary | ICD-10-CM

## 2013-04-25 LAB — GLUCOSE, CAPILLARY
GLUCOSE-CAPILLARY: 72 mg/dL (ref 70–99)
Glucose-Capillary: 74 mg/dL (ref 70–99)
Glucose-Capillary: 90 mg/dL (ref 70–99)
Glucose-Capillary: 143 mg/dL — ABNORMAL HIGH (ref 70–99)

## 2013-04-25 MED ORDER — VALACYCLOVIR HCL 500 MG PO TABS
1000.0000 mg | ORAL_TABLET | Freq: Three times a day (TID) | ORAL | Status: DC
  Administered 2013-04-25 – 2013-04-30 (×15): 1000 mg via ORAL
  Filled 2013-04-25 (×17): qty 2

## 2013-04-25 MED ORDER — IPRATROPIUM-ALBUTEROL 0.5-2.5 (3) MG/3ML IN SOLN
3.0000 mL | Freq: Three times a day (TID) | RESPIRATORY_TRACT | Status: DC
  Administered 2013-04-26 – 2013-04-30 (×13): 3 mL via RESPIRATORY_TRACT
  Filled 2013-04-25 (×19): qty 3

## 2013-04-25 MED ORDER — TRAMADOL HCL 50 MG PO TABS
50.0000 mg | ORAL_TABLET | Freq: Four times a day (QID) | ORAL | Status: DC | PRN
  Administered 2013-04-27 – 2013-04-29 (×7): 50 mg via ORAL
  Filled 2013-04-25 (×7): qty 1

## 2013-04-25 MED ORDER — ALBUTEROL SULFATE (2.5 MG/3ML) 0.083% IN NEBU: 2.5000 mg | INHALATION_SOLUTION | Freq: Four times a day (QID) | RESPIRATORY_TRACT | Status: DC | PRN

## 2013-04-25 MED ORDER — FENTANYL CITRATE 0.05 MG/ML IJ SOLN
25.0000 ug | Freq: Once | INTRAMUSCULAR | Status: AC
  Administered 2013-04-25: 25 ug via INTRAVENOUS
  Filled 2013-04-25: qty 2

## 2013-04-25 MED ORDER — FENTANYL CITRATE 0.05 MG/ML IJ SOLN
INTRAMUSCULAR | Status: AC
  Filled 2013-04-25: qty 2

## 2013-04-25 MED ORDER — ALUM & MAG HYDROXIDE-SIMETH 200-200-20 MG/5ML PO SUSP
30.0000 mL | Freq: Four times a day (QID) | ORAL | Status: DC | PRN
  Administered 2013-04-25 – 2013-04-28 (×5): 30 mL via ORAL
  Filled 2013-04-25 (×5): qty 30

## 2013-04-25 NOTE — Progress Notes (Signed)
Complained of severe indigestion, Dr. Jimmy Footman made aware  with orders made. Will cont. to monitor pt.

## 2013-04-25 NOTE — Evaluation (Signed)
Physical Therapy Evaluation Patient Details Name: Aimee Sanders MRN: [id number] DOB: May 17, 1945 Today's Date: 04/25/2013 Time: 7628-3151 PT Time Calculation (min): 24 min  PT Assessment / Plan / Recommendation History of Present Illness  Pt admitted with dx of PNA.  Pt with PMH of CHF, COPD, and DVT.  Clinical Impression  Pt displaying continuous horizontal eye movement of equal deviation L/R bilaterally and equal cadence.  There is some mild disconjugation, but pt does not report any changes (no spinning etc) except "I can't see the clock.  Not sure if this eye movement is producing the nausea or is a separate and new issue.  Mobility limited by the nausea.  Hopefully pt can go directly home and not need any rehab before being safe to get home.     PT Assessment       Follow Up Recommendations  Home health PT;Other (comment) (will assess further if HHPT is still approp if nausea stays)    Does the patient have the potential to tolerate intense rehabilitation      Barriers to Discharge        Equipment Recommendations  None recommended by PT    Recommendations for Other Services     Frequency Min 3X/week    Precautions / Restrictions Precautions Precautions: Fall Restrictions Weight Bearing Restrictions: No   Pertinent Vitals/Pain       Mobility  Bed Mobility Bed Mobility: Supine to Sit;Sitting - Scoot to Edge of Bed;Sit to Supine Supine to Sit: 3: Mod assist;HOB flat Sitting - Scoot to Edge of Bed: 3: Mod assist Sit to Supine: 3: Mod assist Details for Bed Mobility Assistance: v/tc's for technique and assist due to pt feeling sick Transfers Transfers: Sit to Stand;Stand to Sit Sit to Stand: 1: +2 Total assist Sit to Stand: Patient Percentage: 60% Stand to Sit: 4: Min assist;To bed;With upper extremity assist Details for Transfer Assistance: verbal/tactile cues for sequencing, safety Ambulation/Gait Ambulation/Gait Assistance: Not tested (comment) Stairs:  No Wheelchair Mobility Wheelchair Mobility: No    Exercises     PT Diagnosis:    PT Problem List:   PT Treatment Interventions:       PT Goals(Current goals can be found in the care plan section) Acute Rehab PT Goals Time For Goal Achievement: 05/07/13 Potential to Achieve Goals: Good  Visit Information  Last PT Received On: 04/25/13 Assistance Needed: +2 (safety) History of Present Illness: Pt admitted with dx of PNA.  Pt with PMH of CHF, COPD, and DVT.       Prior Functioning       Cognition  Cognition Arousal/Alertness: Awake/alert;Lethargic Behavior During Therapy: Flat affect;WFL for tasks assessed/performed Overall Cognitive Status: Within Functional Limits for tasks assessed    Extremity/Trunk Assessment     Balance Balance Balance Assessed: Yes Static Sitting Balance Static Sitting - Balance Support: Right upper extremity supported;Feet supported Static Sitting - Level of Assistance: 4: Min assist Static Sitting - Comment/# of Minutes: sat EOB for >10 min working on sitting tolerance, waiting to see if pt's nausea would subside and if pt's eye movements (equal horizontal deviation) would stop. Static Standing Balance Static Standing - Balance Support: Right upper extremity supported;Left upper extremity supported;During functional activity Static Standing - Level of Assistance: 4: Min assist Static Standing - Comment/# of Minutes: 3 minutes static at EOB.  Nausea increased and needed to sit down.  End of Session PT - End of Session Activity Tolerance: Other (comment) (limited by nausea--not sure if from eye movements)  Patient left: in bed;with call bell/phone within reach Nurse Communication: Mobility status  GP     Shanira Tine, Tessie Fass 04/25/2013, 3:55 PM 04/25/2013  Donnella Sham, PT [phone]  (pager)

## 2013-04-25 NOTE — Progress Notes (Signed)
UR Completed.  Aimee Sanders Jane [phone] 04/25/2013  

## 2013-04-25 NOTE — Progress Notes (Signed)
[facility] PROGRESS NOTE  Aimee Sanders [id number] DOB: 10-May-1945 DOA: 04/21/2013 PCP: Redge Gainer, MD  Assessment/Plan: #1 acute respiratory failure Secondary to probable community acquired pneumonia and acute exacerbation of COPD and history of obstructive sleep apnea. Clinical improvement. Patient currently on IV Rocephin. Patient on oral steroid taper. Continue oxygen. Continue nebulizers.  #2 probable community acquired pneumonia Clinical improvement. On IV Rocephin.  #3 acute exacerbation of COPD Improvement. Continue steroid taper, IV antibiotics, nebulizer treatments, oxygen.  #4 sepsis Secondary to Escherichia coli bacteremia and Escherichia coli UTI. Patient currently on IV Rocephin. Will treat for total of 14 days with day #1 starting on 04/22/2013. If continued improvement and afebrile will consider transitioned to oral Keflex 500 mg 4 times a day in about 2-3 days to complete course of antibiotic therapy.  #5 Escherichia coli bacteremia Likely seeded from the urine. Currently on IV Rocephin. Treat for total of 14 days day #1 starting on 04/22/2013. May consider transitioned to oral Keflex at 500 mg 4 times a day in about 2-3 days to complete course of antibiotic therapy.  #6 Escherichia coli UTI On IV Rocephin.  #7 chronic systolic/diastolic CHF Stable. Continue diuretics.  #8 chronic DVT Continue Zaroxolyn.  #9 coronary artery disease Stable. Continue imdur, fenofibrate, lasix, Lopressor.  #10 herpes stomatitis Will start patient on Valtrex and complete a 10-14 day course of therapy.  #11 anxiety/depression/chronic pain Continue Celexa.  #12 steroid-induced hyperglycemia On a sliding scale insulin.  #13 anemia of critical illness H&H stable. Follow. Transfusion threshold hemoglobin less than 7.  #14 leukocytosis Likely secondary to Escherichia coli bacteremia, E. coli UTI, probable community-acquired pneumonia. WBC trending down. Continue  empiric IV Rocephin.  #15 nystagmus Likely secondary to astigmatism which patient has a history of, which is been worsened by her acute illness.   #15 prophylaxis PPI for GI prophylaxis. On xarelto for DVT.  Code Status: Full Family Communication: Updated patient no family at bedside. Disposition Plan: Home when medically stable.   Consultants:  None  Procedures: 1/2 Venous Doppler LE >>> Technically limited study. There is a possible chronic DVT noted in a small area of the right profunda vein   Antibiotics: Vancomycin 1/2 >>> 1/4  Azithromycin 1/2 >>> 1/5  Cefepime 1/2 >>> 1/5  Tamiflu 1/2 >>> 1/3  Ceftriaxone 1/5 >>>  LINES / TUBES:  L IJ CVL 1/2 >>>  Foley 1/2 >>> 1/5       HPI/Subjective: Patient complaining of nausea. No emesis. Patient also complaining of right back sided pain.  Objective: Filed Vitals:   04/25/13 1345  BP: 127/71  Pulse: 81  Temp: 97.9 F (36.6 C)  Resp: 19    Intake/Output Summary (Last 24 hours) at 04/25/13 2029 Last data filed at 04/25/13 1630  Gross per 24 hour  Intake    565 ml  Output   1601 ml  Net  -1036 ml   Filed Weights   04/21/13 0500 04/22/13 0400 04/24/13 0600  Weight: 70.217 kg (154 lb 12.8 oz) 68.3 kg (150 lb 9.2 oz) 68.7 kg (151 lb 7.3 oz)    Exam:   General:  NAD. Patient with oral herpes stomatitis. Patient with nystagmus.  Cardiovascular: RRR  Respiratory: Clear to auscultation bilaterally.  Abdomen: Soft, nontender, nondistended, positive bowel sounds.  Musculoskeletal: No clubbing cyanosis or edema.  Data Reviewed: Basic Metabolic Panel:  Recent Labs Lab 04/21/13 0110 04/21/13 0251 04/22/13 0400 04/23/13 0400 04/24/13 0411  NA 142 143 144 139 139  K 4.5  3.9 3.6* 4.3 4.0  CL 103 108 110 106 104  CO2 25 22 22 23 26   GLUCOSE 113* 140* 130* 157* 123*  BUN 39* 36* 41* 45* 40*  CREATININE 1.37* 1.32* 1.04 0.95 0.89  CALCIUM 8.8 7.1* 8.0* 8.6 8.8  MG  --  1.8  --  2.4  --   PHOS  --   2.2*  --  3.6  --    Liver Function Tests:  Recent Labs Lab 04/21/13 0251  AST 14  ALT 11  ALKPHOS 88  BILITOT 0.3  PROT 4.4*  ALBUMIN 1.4*   No results found for this basename: LIPASE, AMYLASE,  in the last 168 hours No results found for this basename: AMMONIA,  in the last 168 hours CBC:  Recent Labs Lab 04/21/13 0110 04/21/13 0251 04/22/13 0400 04/23/13 0400 04/24/13 0411  WBC 20.2* 18.6* 28.8* 17.2* 10.3  NEUTROABS 18.2*  --   --   --   --   HGB 10.5* 8.3* 8.5* 8.5* 8.4*  HCT 32.6* 26.5* 25.4* 25.9* 25.9*  MCV 96.2 97.4 94.4 94.2 94.2  PLT 129* 107* 85* 96* 114*   Cardiac Enzymes: No results found for this basename: CKTOTAL, CKMB, CKMBINDEX, TROPONINI,  in the last 168 hours BNP (last 3 results)  Recent Labs  08/31/12 1303 12/08/12 1907 04/21/13 0110  PROBNP 2598.0* 1042.0* 9278.0*   CBG:  Recent Labs Lab 04/24/13 2031 04/24/13 2207 04/25/13 0626 04/25/13 1133 04/25/13 1649  GLUCAP 120* 88 74 90 143*    Recent Results (from the past 240 hour(s))  CULTURE, BLOOD (ROUTINE X 2)     Status: None   Collection Time    04/21/13 12:44 AM      Result Value Range Status   Specimen Description BLOOD LEFT HAND   Final   Special Requests BOTTLES DRAWN AEROBIC ONLY 8CC   Final   Culture  Setup Time     Final   Value: 04/21/2013 09:03     Performed at [facility]   Culture     Final   Value: ESCHERICHIA COLI     Note: SUSCEPTIBILITIES PERFORMED ON PREVIOUS CULTURE WITHIN THE LAST 5 DAYS.     2 Note: Gram Stain Report Called to,Read Back By and Verified With: Audie Pinto RN 1 63 630P EDMOJ     Performed at [facility]   Report Status 04/23/2013 FINAL   Final  CULTURE, BLOOD (ROUTINE X 2)     Status: None   Collection Time    04/21/13  1:10 AM      Result Value Range Status   Specimen Description BLOOD LEFT ARM   Final   Special Requests BOTTLES DRAWN AEROBIC AND ANAEROBIC 10CC   Final   Culture  Setup Time     Final   Value:  04/21/2013 09:02     Performed at [facility]   Culture     Final   Value: ESCHERICHIA COLI     2 Note: Gram Stain Report Called to,Read Back By and Verified With: Audie Pinto RN 1 15 630P EDMOJ     Performed at [facility]   Report Status 04/23/2013 FINAL   Final   Organism ID, Bacteria ESCHERICHIA COLI   Final  URINE CULTURE     Status: None   Collection Time    04/21/13  2:50 AM      Result Value Range Status   Specimen Description URINE, CATHETERIZED   Final  Special Requests NONE   Final   Culture  Setup Time     Final   Value: 04/21/2013 03:30     Performed at [place]     Final   Value: >=100,000 COLONIES/ML     Performed at [facility]   Culture     Final   Value: ESCHERICHIA COLI     Performed at [facility]   Report Status 04/23/2013 FINAL   Final   Organism ID, Bacteria ESCHERICHIA COLI   Final  MRSA PCR SCREENING     Status: None   Collection Time    04/21/13  4:46 AM      Result Value Range Status   MRSA by PCR NEGATIVE  NEGATIVE Final   Comment:            The GeneXpert MRSA Assay (FDA     approved for NASAL specimens     only), is one component of a     comprehensive MRSA colonization     surveillance program. It is not     intended to diagnose MRSA     infection nor to guide or     monitor treatment for     MRSA infections.     Studies: Dg Chest Port 1 View  04/24/2013   CLINICAL DATA:  Followup pneumonia.  EXAM: PORTABLE CHEST - 1 VIEW  COMPARISON:  04/22/2013.  FINDINGS: Rotation to the left.  Left central line tip proximal superior vena cava level. Cement augmentation limits evaluation of the exact position of central line.  Cardiomegaly.  Calcified tortuous aorta.  Bibasilar consolidation greater on the left may represent pleural fluid with atelectasis and/ infiltrate and more notable on the left. Findings have changed minimally since prior exam taking into account differences in patient  position.  Pulmonary vascular congestion.  No gross pneumothorax.  IMPRESSION: Bibasilar consolidation greater on the left may represent pleural fluid with atelectasis and/ infiltrate and more notable on the left. Findings have changed minimally since prior exam taking into account differences in patient position.  Pulmonary vascular congestion.   Electronically Signed   By: Chauncey Cruel M.D.   On: 04/24/2013 07:55    Scheduled Meds: . cefTRIAXone (ROCEPHIN)  IV  2 g Intravenous Q24H  . citalopram  20 mg Oral Daily  . fenofibrate  160 mg Oral Daily  . furosemide  40 mg Oral Daily  . insulin aspart  0-15 Units Subcutaneous TID WC  . insulin aspart  0-5 Units Subcutaneous QHS  . ipratropium-albuterol  3 mL Nebulization Q6H  . isosorbide mononitrate  30 mg Oral Daily  . metoprolol tartrate  12.5 mg Oral Daily  . pantoprazole  40 mg Oral Daily  . polyethylene glycol  17 g Oral Daily  . predniSONE  40 mg Oral Q breakfast  . rivaroxaban  20 mg Oral Q supper  . sodium chloride  10-40 mL Intracatheter Q12H  . valACYclovir  1,000 mg Oral TID   Continuous Infusions: . dextrose 5 % and 0.9% NaCl 20 mL/hr at 04/25/13 0100    Principal Problem:   Acute-on-chronic respiratory failure Active Problems:   HYPERLIPIDEMIA   C V A / STROKE   COPD with acute exacerbation   Chronic combined systolic and diastolic CHF (congestive heart failure)   CHF (congestive heart failure)   Pneumonia   Bacteremia   Sepsis   E-coli UTI   Herpes stomatitis   Nystagmus, unspecified  Time spent: 13 minutes    Drevin Ortner M.D. [facility] Pager [phone]. If 7PM-7AM, please contact night-coverage at [email or web address], password [facility] 04/25/2013, 8:29 PM  LOS: 4 days

## 2013-04-26 DIAGNOSIS — B002 Herpesviral gingivostomatitis and pharyngotonsillitis: Secondary | ICD-10-CM

## 2013-04-26 LAB — BASIC METABOLIC PANEL
BUN: 21 mg/dL (ref 6–23)
CALCIUM: 8.6 mg/dL (ref 8.4–10.5)
CO2: 30 meq/L (ref 19–32)
Chloride: 105 mEq/L (ref 96–112)
Creatinine, Ser: 0.76 mg/dL (ref 0.50–1.10)
GFR calc Af Amer: >90 mL/min (ref >90)
GFR calc non Af Amer: 85 mL/min — ABNORMAL LOW (ref >90)
Glucose, Bld: 67 mg/dL — ABNORMAL LOW (ref 70–99)
POTASSIUM: 3.4 meq/L — AB (ref 3.7–5.3)
SODIUM: 145 meq/L (ref 137–147)

## 2013-04-26 LAB — CBC
HCT: 24.2 % — ABNORMAL LOW (ref 36.0–46.0)
Hemoglobin: 7.9 g/dL — ABNORMAL LOW (ref 12.0–15.0)
MCH: 31.1 pg (ref 26.0–34.0)
MCHC: 32.6 g/dL (ref 30.0–36.0)
MCV: 95.3 fL (ref 78.0–100.0)
PLATELETS: 156 10*3/uL (ref 150–400)
RBC: 2.54 MIL/uL — ABNORMAL LOW (ref 3.87–5.11)
RDW: 14.8 % (ref 11.5–15.5)
WBC: 6.4 10*3/uL (ref 4.0–10.5)

## 2013-04-26 LAB — GLUCOSE, CAPILLARY
Glucose-Capillary: 76 mg/dL (ref 70–99)
Glucose-Capillary: 97 mg/dL (ref 70–99)
Glucose-Capillary: 114 mg/dL — ABNORMAL HIGH (ref 70–99)
Glucose-Capillary: 92 mg/dL (ref 70–99)

## 2013-04-26 MED ORDER — NYSTATIN 100000 UNIT/ML MT SUSP
5.0000 mL | Freq: Four times a day (QID) | OROMUCOSAL | Status: DC
  Administered 2013-04-26 – 2013-04-30 (×16): 500000 [IU] via ORAL
  Filled 2013-04-26 (×19): qty 5

## 2013-04-26 MED ORDER — ALPRAZOLAM 0.25 MG PO TABS
0.5000 mg | ORAL_TABLET | Freq: Once | ORAL | Status: AC
  Administered 2013-04-26: 0.5 mg via ORAL
  Filled 2013-04-26: qty 2

## 2013-04-26 NOTE — Care Management Note (Signed)
    Page 1 of 1   04/26/2013     2:48:39 PM   CARE MANAGEMENT NOTE 04/26/2013  Patient:  Aimee Sanders, Aimee Sanders   Account Number:  [id number]  Date Initiated:  04/21/2013  Documentation initiated by:  MAYO,HENRIETTA  Subjective/Objective Assessment:   adm with dx of PNA; lives with son, has home O2 through [employer]    PCP:  Dr Redge Gainer     Action/Plan:   Anticipated DC Date:  04/30/2013   Anticipated DC Plan:  [place]  CM consult      Choice offered to / List presented to:             Status of service:  In process, will continue to follow Medicare Important Message given?   (If response is "NO", the following Medicare IM given date fields will be blank) Date Medicare IM given:   Date Additional Medicare IM given:    Discharge Disposition:    Per UR Regulation:  Reviewed for med. necessity/level of care/duration of stay  If discussed at [place] of Stay Meetings, dates discussed:    Comments:  04/26/13 Jais Demir,RN,BSN [phone] P.T. RECOMMENDING HH FOLLOW UP AT DC.  WILL CONT TO FOLLOW; PT CONT ON IV ANTIBIOTICS PRESENTLY.  PROGRESS LIMITED BY NAUSEA.

## 2013-04-26 NOTE — Progress Notes (Signed)
[facility] PROGRESS NOTE  Aimee Sanders IOX:[id number] DOB: 1946-03-17 DOA: 04/21/2013 PCP: Redge Gainer, MD  Assessment/Plan: 1. Acute respiratory failure: Secondary to CAP, COPD exacerbation, OSA. Improved. Continue antibiotics, oral steroids, nebulizes, oxygen.  2. Community acquired pneumonia: continue rocephin. improving 3. COPD exacerbation: continue steroids, antibiotics, nebs, O2 4. Sepsis: Due to E. Coli UTI and bacteremia.  Continue Rocephin for total of 14 d (now d 5/14). Could transition to Keflex 500mg  4xd to complete oral course. 5. E. Coli bacteremia: seeded from urine. Continue antibiotics as above.  6. E. Coli UTI: continue antibiotics 7. Chronic systolic/diastolic CHF: stable. No change 8. Chronic DVT: continue zaroxolyn 9. CAD: no chest pain. Stable. Continue imdur, fenofibrate, lasix, lopressor 10. Herpes stomatitis: continue valtrex 11. Oral candidiasis: add nystatin oral 12. Anxiety/depression: continue celexa 13. Anemia: continue to monitor 14. Prophylaxis: xarelto, PPI  Code Status: full Family Communication: spoke with patient and son at bedside Disposition Plan: home when stable   Consultants:  none  Procedures:  1/2 Venous Doppler LE >>> Technically limited study. There is a possible chronic DVT noted in a small area of the right profunda vein   Antibiotics: Vancomycin 1/2 >>> 1/4  Azithromycin 1/2 >>> 1/5  Cefepime 1/2 >>> 1/5  Tamiflu 1/2 >>> 1/3  Ceftriaxone 1/5 >>>   HPI/Subjective: Feels fatigued. Complaint of mouth pain and unable to eat. Respiratory status is better.  Objective: Filed Vitals:   04/26/13 1022  BP: 122/67  Pulse: 88  Temp:   Resp: 18    Intake/Output Summary (Last 24 hours) at 04/26/13 1310 Last data filed at 04/26/13 1236  Gross per 24 hour  Intake    250 ml  Output   1451 ml  Net  -1201 ml   Filed Weights   04/21/13 0500 04/22/13 0400 04/24/13 0600  Weight: 70.217 kg (154 lb 12.8 oz) 68.3 kg  (150 lb 9.2 oz) 68.7 kg (151 lb 7.3 oz)    Exam:   General:  Fatigued, comfortable  Cardiovascular: rrr no mrg  Respiratory: scattered rhonchi and cough, fair air movement  Abdomen: bs+, soft, non tender  Oral: mmm, tongue is coated in white plaque  Data Reviewed: Basic Metabolic Panel:  Recent Labs Lab 04/21/13 0251 04/22/13 0400 04/23/13 0400 04/24/13 0411 04/26/13 0330  NA 143 144 139 139 145  K 3.9 3.6* 4.3 4.0 3.4*  CL 108 110 106 104 105  CO2 22 22 23 26 30   GLUCOSE 140* 130* 157* 123* 67*  BUN 36* 41* 45* 40* 21  CREATININE 1.32* 1.04 0.95 0.89 0.76  CALCIUM 7.1* 8.0* 8.6 8.8 8.6  MG 1.8  --  2.4  --   --   PHOS 2.2*  --  3.6  --   --    Liver Function Tests:  Recent Labs Lab 04/21/13 0251  AST 14  ALT 11  ALKPHOS 88  BILITOT 0.3  PROT 4.4*  ALBUMIN 1.4*   No results found for this basename: LIPASE, AMYLASE,  in the last 168 hours No results found for this basename: AMMONIA,  in the last 168 hours CBC:  Recent Labs Lab 04/21/13 0110 04/21/13 0251 04/22/13 0400 04/23/13 0400 04/24/13 0411 04/26/13 0330  WBC 20.2* 18.6* 28.8* 17.2* 10.3 6.4  NEUTROABS 18.2*  --   --   --   --   --   HGB 10.5* 8.3* 8.5* 8.5* 8.4* 7.9*  HCT 32.6* 26.5* 25.4* 25.9* 25.9* 24.2*  MCV 96.2 97.4 94.4 94.2 94.2 95.3  PLT 129* 107* 85* 96* 114* 156   Cardiac Enzymes: No results found for this basename: CKTOTAL, CKMB, CKMBINDEX, TROPONINI,  in the last 168 hours BNP (last 3 results)  Recent Labs  08/31/12 1303 12/08/12 1907 04/21/13 0110  PROBNP 2598.0* 1042.0* 9278.0*   CBG:  Recent Labs Lab 04/25/13 1133 04/25/13 1649 04/25/13 2059 04/26/13 0647 04/26/13 1136  GLUCAP 90 143* 72 76 97    Recent Results (from the past 240 hour(s))  CULTURE, BLOOD (ROUTINE X 2)     Status: None   Collection Time    04/21/13 12:44 AM      Result Value Range Status   Specimen Description BLOOD LEFT HAND   Final   Special Requests BOTTLES DRAWN AEROBIC ONLY 8CC    Final   Culture  Setup Time     Final   Value: 04/21/2013 09:03     Performed at [facility]   Culture     Final   Value: ESCHERICHIA COLI     Note: SUSCEPTIBILITIES PERFORMED ON PREVIOUS CULTURE WITHIN THE LAST 5 DAYS.     2 Note: Gram Stain Report Called to,Read Back By and Verified With: Audie Pinto RN 1 53 630P EDMOJ     Performed at [facility]   Report Status 04/23/2013 FINAL   Final  CULTURE, BLOOD (ROUTINE X 2)     Status: None   Collection Time    04/21/13  1:10 AM      Result Value Range Status   Specimen Description BLOOD LEFT ARM   Final   Special Requests BOTTLES DRAWN AEROBIC AND ANAEROBIC 10CC   Final   Culture  Setup Time     Final   Value: 04/21/2013 09:02     Performed at [facility]   Culture     Final   Value: ESCHERICHIA COLI     2 Note: Gram Stain Report Called to,Read Back By and Verified With: Audie Pinto RN 1 15 630P EDMOJ     Performed at [facility]   Report Status 04/23/2013 FINAL   Final   Organism ID, Bacteria ESCHERICHIA COLI   Final  URINE CULTURE     Status: None   Collection Time    04/21/13  2:50 AM      Result Value Range Status   Specimen Description URINE, CATHETERIZED   Final   Special Requests NONE   Final   Culture  Setup Time     Final   Value: 04/21/2013 03:30     Performed at [place]     Final   Value: >=100,000 COLONIES/ML     Performed at [facility]   Culture     Final   Value: ESCHERICHIA COLI     Performed at [facility]   Report Status 04/23/2013 FINAL   Final   Organism ID, Bacteria ESCHERICHIA COLI   Final  MRSA PCR SCREENING     Status: None   Collection Time    04/21/13  4:46 AM      Result Value Range Status   MRSA by PCR NEGATIVE  NEGATIVE Final   Comment:            The GeneXpert MRSA Assay (FDA     approved for NASAL specimens     only), is one component of a     comprehensive MRSA colonization     surveillance program.  It is not  intended to diagnose MRSA     infection nor to guide or     monitor treatment for     MRSA infections.     Studies: No results found.  Scheduled Meds: . cefTRIAXone (ROCEPHIN)  IV  2 g Intravenous Q24H  . citalopram  20 mg Oral Daily  . fenofibrate  160 mg Oral Daily  . furosemide  40 mg Oral Daily  . insulin aspart  0-15 Units Subcutaneous TID WC  . insulin aspart  0-5 Units Subcutaneous QHS  . ipratropium-albuterol  3 mL Nebulization TID  . isosorbide mononitrate  30 mg Oral Daily  . metoprolol tartrate  12.5 mg Oral Daily  . pantoprazole  40 mg Oral Daily  . polyethylene glycol  17 g Oral Daily  . predniSONE  40 mg Oral Q breakfast  . rivaroxaban  20 mg Oral Q supper  . sodium chloride  10-40 mL Intracatheter Q12H  . valACYclovir  1,000 mg Oral TID   Continuous Infusions: . dextrose 5 % and 0.9% NaCl 20 mL/hr at 04/25/13 0100    Principal Problem:   Acute-on-chronic respiratory failure Active Problems:   HYPERLIPIDEMIA   C V A / STROKE   COPD with acute exacerbation   Chronic combined systolic and diastolic CHF (congestive heart failure)   CHF (congestive heart failure)   Pneumonia   Bacteremia   Sepsis   E-coli UTI   Herpes stomatitis   Nystagmus, unspecified    Time spent: 35 minutes    [place] Pager [phone]. If 7PM-7AM, please contact night-coverage at [email or web address], password [facility] 04/26/2013, 1:10 PM  LOS: 5 days

## 2013-04-27 DIAGNOSIS — E871 Hypo-osmolality and hyponatremia: Secondary | ICD-10-CM

## 2013-04-27 LAB — CBC
HCT: 24.4 % — ABNORMAL LOW (ref 36.0–46.0)
Hemoglobin: 8.2 g/dL — ABNORMAL LOW (ref 12.0–15.0)
MCH: 31.7 pg (ref 26.0–34.0)
MCHC: 33.6 g/dL (ref 30.0–36.0)
MCV: 94.2 fL (ref 78.0–100.0)
Platelets: 210 10*3/uL (ref 150–400)
RBC: 2.59 MIL/uL — ABNORMAL LOW (ref 3.87–5.11)
RDW: 14.5 % (ref 11.5–15.5)
WBC: 6.4 10*3/uL (ref 4.0–10.5)

## 2013-04-27 LAB — BASIC METABOLIC PANEL
BUN: 18 mg/dL (ref 6–23)
BUN: 18 mg/dL (ref 6–23)
CALCIUM: 8.6 mg/dL (ref 8.4–10.5)
CHLORIDE: 97 meq/L (ref 96–112)
CO2: 35 mEq/L — ABNORMAL HIGH (ref 19–32)
CO2: 34 meq/L — AB (ref 19–32)
CREATININE: 0.68 mg/dL (ref 0.50–1.10)
Calcium: 8.5 mg/dL (ref 8.4–10.5)
Chloride: 100 mEq/L (ref 96–112)
Creatinine, Ser: 0.7 mg/dL (ref 0.50–1.10)
GFR calc Af Amer: >90 mL/min (ref >90)
GFR calc non Af Amer: 87 mL/min — ABNORMAL LOW (ref >90)
GFR, EST NON AFRICAN AMERICAN: 88 mL/min — AB (ref >90)
Glucose, Bld: 71 mg/dL (ref 70–99)
Glucose, Bld: 239 mg/dL — ABNORMAL HIGH (ref 70–99)
POTASSIUM: 4.1 meq/L (ref 3.7–5.3)
Potassium: 2.8 mEq/L — CL (ref 3.7–5.3)
SODIUM: 140 meq/L (ref 137–147)
Sodium: 144 mEq/L (ref 137–147)

## 2013-04-27 LAB — GLUCOSE, CAPILLARY
GLUCOSE-CAPILLARY: 89 mg/dL (ref 70–99)
GLUCOSE-CAPILLARY: 228 mg/dL — AB (ref 70–99)
GLUCOSE-CAPILLARY: 201 mg/dL — AB (ref 70–99)
Glucose-Capillary: 80 mg/dL (ref 70–99)
Glucose-Capillary: 100 mg/dL — ABNORMAL HIGH (ref 70–99)

## 2013-04-27 LAB — MAGNESIUM: MAGNESIUM: 1.8 mg/dL (ref 1.5–2.5)

## 2013-04-27 MED ORDER — ALPRAZOLAM 0.25 MG PO TABS
0.5000 mg | ORAL_TABLET | Freq: Four times a day (QID) | ORAL | Status: DC
  Administered 2013-04-27 – 2013-04-30 (×12): 0.5 mg via ORAL
  Filled 2013-04-27 (×12): qty 2

## 2013-04-27 MED ORDER — POTASSIUM CHLORIDE 20 MEQ/15ML (10%) PO LIQD
40.0000 meq | Freq: Once | ORAL | Status: AC
  Administered 2013-04-27: 40 meq via ORAL
  Filled 2013-04-27: qty 30

## 2013-04-27 MED ORDER — POTASSIUM CHLORIDE CRYS ER 20 MEQ PO TBCR
40.0000 meq | EXTENDED_RELEASE_TABLET | Freq: Once | ORAL | Status: AC
  Administered 2013-04-27: 40 meq via ORAL
  Filled 2013-04-27: qty 2

## 2013-04-27 NOTE — Progress Notes (Signed)
Physical Therapy Treatment Patient Details Name: Aimee Sanders MRN: [id number] DOB: [birth year] Today's Date: 04/27/2013 Time: 3976-7341 PT Time Calculation (min): 20 min  PT Assessment / Plan / Recommendation  History of Present Illness Pt admitted with dx of PNA.  Pt with PMH of CHF, COPD, and DVT.   PT Comments   Pt making slow progress, but improving.  She was quick to fatigue during walking , with degradation of gait.   We need to determine if pt has continuous assist at home, otherwise pt may need a short term SNF for rehab, but at this time shooting for HHPT.   Follow Up Recommendations  Home health PT;Other (comment)     Does the patient have the potential to tolerate intense rehabilitation     Barriers to Discharge        Equipment Recommendations  None recommended by PT    Recommendations for Other Services    Frequency Min 3X/week   Progress towards PT Goals Progress towards PT goals: Progressing toward goals (but slow progress)  Plan Current plan remains appropriate    Precautions / Restrictions Precautions Precautions: Fall Restrictions Weight Bearing Restrictions: No   Pertinent Vitals/Pain     Mobility  Bed Mobility Overal bed mobility: Needs Assistance Bed Mobility: Sidelying to Sit Sidelying to sit: Min assist General bed mobility comments: rolling with rail and no assist, but assist to come forward up on R elbow Transfers Overall transfer level: Needs assistance Transfers: Sit to/from Stand Sit to Stand: Min assist General transfer comment: cues for transfer safety and assist to come forward. Ambulation/Gait Ambulation/Gait assistance: Mod assist (+1 for lines) Ambulation Distance (Feet): 20 Feet (then 15' from chair back to bed.) Assistive device: 1 person hand held assist (leaning on IV pole) Gait Pattern/deviations: Step-through pattern;Decreased step length - right;Decreased step length - left;Decreased stride length;Trendelenburg;Trunk  flexed Gait velocity interpretation: Below normal speed for age/gender    Exercises     PT Diagnosis:    PT Problem List:   PT Treatment Interventions:     PT Goals (current goals can now be found in the care plan section) Acute Rehab PT Goals Patient Stated Goal: home PT Goal Formulation: With patient Time For Goal Achievement: 05/07/13 Potential to Achieve Goals: Good  Visit Information  Last PT Received On: 04/27/13 Assistance Needed: +2 History of Present Illness: Pt admitted with dx of PNA.  Pt with PMH of CHF, COPD, and DVT.    Subjective Data  Subjective: Just have heartburn with eating today Patient Stated Goal: home   Cognition  Cognition Arousal/Alertness: Awake/alert;Lethargic Behavior During Therapy: WFL for tasks assessed/performed Overall Cognitive Status: Within Functional Limits for tasks assessed    Balance  Balance Overall balance assessment: Needs assistance Standing balance support: Bilateral upper extremity supported Standing balance-Leahy Scale: Fair  End of Session PT - End of Session Equipment Utilized During Treatment: Gait belt;Oxygen Activity Tolerance: Patient limited by fatigue;Patient limited by pain Patient left: in bed;with call bell/phone within reach Nurse Communication: Mobility status   GP     Gared Gillie, Tessie Fass 04/27/2013, 6:51 PM 04/27/2013  Donnella Sham, PT [phone]  (pager)

## 2013-04-27 NOTE — Progress Notes (Signed)
[facility] PROGRESS NOTE  Aimee Sanders OAC:[id number] DOB: 1946/04/14 DOA: 04/21/2013 PCP: Redge Gainer, MD  Assessment/Plan: 1. Acute respiratory failure: Secondary to CAP, COPD exacerbation, OSA. Improved. Continue antibiotics, oral steroids, nebulizers, oxygen.  2. Community acquired pneumonia: improving. Continue rocephin. 3. COPD exacerbation: continue steroids, antibiotics, nebs, O2 4. Sepsis: Due to E. Coli UTI and bacteremia.  Continue Rocephin for total of 14 d (now d 6/14). Could possibly transition to Keflex 500mg  4xd to complete oral course. 5. E. Coli bacteremia: seeded from urine. Continue antibiotics as above.  6. E. Coli UTI: continue antibiotics 7. Hypokalemia: low K this am, repleteing. Check mg. Recheck in pm 8. Chronic systolic/diastolic CHF: stable. No change 9. Chronic DVT: continue zaroxolyn 10. CAD: no chest pain. Stable. Continue imdur, fenofibrate, lasix, lopressor 11. Herpes stomatitis: continue valtrex 12. Oral candidiasis: add nystatin oral 13. Anxiety/depression: continue celexa 14. Anemia: continue to monitor 15. Prophylaxis: xarelto, PPI  Code Status: full Family Communication: spoke with patient and son at bedside Disposition Plan: home when stable   Consultants:  none  Procedures:  1/2 Venous Doppler LE >>> Technically limited study. There is a possible chronic DVT noted in a small area of the right profunda vein   Antibiotics: Vancomycin 1/2 >>> 1/4  Azithromycin 1/2 >>> 1/5  Cefepime 1/2 >>> 1/5  Tamiflu 1/2 >>> 1/3  Ceftriaxone 1/5 >>>   HPI/Subjective: Feels fatigued. Complaint of mouth pain and unable to eat. Respiratory status is better.  Objective: Filed Vitals:   04/27/13 0404  BP: 105/66  Pulse: 100  Temp: 99.2 F (37.3 C)  Resp:     Intake/Output Summary (Last 24 hours) at 04/27/13 0913 Last data filed at 04/26/13 1700  Gross per 24 hour  Intake    370 ml  Output   1652 ml  Net  -1282 ml   Filed  Weights   04/21/13 0500 04/22/13 0400 04/24/13 0600  Weight: 70.217 kg (154 lb 12.8 oz) 68.3 kg (150 lb 9.2 oz) 68.7 kg (151 lb 7.3 oz)    Exam:   General:  Fatigued, comfortable  Cardiovascular: rrr no mrg  Respiratory: scattered rhonchi and cough, fair air movement  Abdomen: bs+, soft, non tender  Oral: mmm, tongue is coated in white plaque  Data Reviewed: Basic Metabolic Panel:  Recent Labs Lab 04/21/13 0251 04/22/13 0400 04/23/13 0400 04/24/13 0411 04/26/13 0330 04/27/13 0357  NA 143 144 139 139 145 144  K 3.9 3.6* 4.3 4.0 3.4* 2.8*  CL 108 110 106 104 105 100  CO2 22 22 23 26 30  35*  GLUCOSE 140* 130* 157* 123* 67* 71  BUN 36* 41* 45* 40* 21 18  CREATININE 1.32* 1.04 0.95 0.89 0.76 0.68  CALCIUM 7.1* 8.0* 8.6 8.8 8.6 8.5  MG 1.8  --  2.4  --   --   --   PHOS 2.2*  --  3.6  --   --   --    Liver Function Tests:  Recent Labs Lab 04/21/13 0251  AST 14  ALT 11  ALKPHOS 88  BILITOT 0.3  PROT 4.4*  ALBUMIN 1.4*   No results found for this basename: LIPASE, AMYLASE,  in the last 168 hours No results found for this basename: AMMONIA,  in the last 168 hours CBC:  Recent Labs Lab 04/21/13 0110  04/22/13 0400 04/23/13 0400 04/24/13 0411 04/26/13 0330 04/27/13 0357  WBC 20.2*  < > 28.8* 17.2* 10.3 6.4 6.4  NEUTROABS 18.2*  --   --   --   --   --   --  HGB 10.5*  < > 8.5* 8.5* 8.4* 7.9* 8.2*  HCT 32.6*  < > 25.4* 25.9* 25.9* 24.2* 24.4*  MCV 96.2  < > 94.4 94.2 94.2 95.3 94.2  PLT 129*  < > 85* 96* 114* 156 210  < > = values in this interval not displayed. Cardiac Enzymes: No results found for this basename: CKTOTAL, CKMB, CKMBINDEX, TROPONINI,  in the last 168 hours BNP (last 3 results)  Recent Labs  08/31/12 1303 12/08/12 1907 04/21/13 0110  PROBNP 2598.0* 1042.0* 9278.0*   CBG:  Recent Labs Lab 04/26/13 0647 04/26/13 1136 04/26/13 1640 04/26/13 2122 04/27/13 0619  GLUCAP 76 97 114* 92 80    Recent Results (from the past 240  hour(s))  CULTURE, BLOOD (ROUTINE X 2)     Status: None   Collection Time    04/21/13 12:44 AM      Result Value Range Status   Specimen Description BLOOD LEFT HAND   Final   Special Requests BOTTLES DRAWN AEROBIC ONLY 8CC   Final   Culture  Setup Time     Final   Value: 04/21/2013 09:03     Performed at [facility]   Culture     Final   Value: ESCHERICHIA COLI     Note: SUSCEPTIBILITIES PERFORMED ON PREVIOUS CULTURE WITHIN THE LAST 5 DAYS.     2 Note: Gram Stain Report Called to,Read Back By and Verified With: Audie Pinto RN 1 61 630P EDMOJ     Performed at [facility]   Report Status 04/23/2013 FINAL   Final  CULTURE, BLOOD (ROUTINE X 2)     Status: None   Collection Time    04/21/13  1:10 AM      Result Value Range Status   Specimen Description BLOOD LEFT ARM   Final   Special Requests BOTTLES DRAWN AEROBIC AND ANAEROBIC 10CC   Final   Culture  Setup Time     Final   Value: 04/21/2013 09:02     Performed at [facility]   Culture     Final   Value: ESCHERICHIA COLI     2 Note: Gram Stain Report Called to,Read Back By and Verified With: Audie Pinto RN 1 15 630P EDMOJ     Performed at [facility]   Report Status 04/23/2013 FINAL   Final   Organism ID, Bacteria ESCHERICHIA COLI   Final  URINE CULTURE     Status: None   Collection Time    04/21/13  2:50 AM      Result Value Range Status   Specimen Description URINE, CATHETERIZED   Final   Special Requests NONE   Final   Culture  Setup Time     Final   Value: 04/21/2013 03:30     Performed at [place]     Final   Value: >=100,000 COLONIES/ML     Performed at [facility]   Culture     Final   Value: ESCHERICHIA COLI     Performed at [facility]   Report Status 04/23/2013 FINAL   Final   Organism ID, Bacteria ESCHERICHIA COLI   Final  MRSA PCR SCREENING     Status: None   Collection Time    04/21/13  4:46 AM      Result Value Range  Status   MRSA by PCR NEGATIVE  NEGATIVE Final   Comment:  The GeneXpert MRSA Assay (FDA     approved for NASAL specimens     only), is one component of a     comprehensive MRSA colonization     surveillance program. It is not     intended to diagnose MRSA     infection nor to guide or     monitor treatment for     MRSA infections.     Studies: No results found.  Scheduled Meds: . cefTRIAXone (ROCEPHIN)  IV  2 g Intravenous Q24H  . citalopram  20 mg Oral Daily  . fenofibrate  160 mg Oral Daily  . furosemide  40 mg Oral Daily  . insulin aspart  0-15 Units Subcutaneous TID WC  . insulin aspart  0-5 Units Subcutaneous QHS  . ipratropium-albuterol  3 mL Nebulization TID  . isosorbide mononitrate  30 mg Oral Daily  . metoprolol tartrate  12.5 mg Oral Daily  . nystatin  5 mL Oral QID  . pantoprazole  40 mg Oral Daily  . polyethylene glycol  17 g Oral Daily  . potassium chloride  40 mEq Oral Once  . predniSONE  40 mg Oral Q breakfast  . rivaroxaban  20 mg Oral Q supper  . sodium chloride  10-40 mL Intracatheter Q12H  . valACYclovir  1,000 mg Oral TID   Continuous Infusions: . dextrose 5 % and 0.9% NaCl 20 mL/hr at 04/25/13 0100    Principal Problem:   Acute-on-chronic respiratory failure Active Problems:   HYPERLIPIDEMIA   C V A / STROKE   COPD with acute exacerbation   Chronic combined systolic and diastolic CHF (congestive heart failure)   CHF (congestive heart failure)   Pneumonia   Bacteremia   Sepsis   E-coli UTI   Herpes stomatitis   Nystagmus, unspecified    Time spent: 35 minutes    [facility] Pager [phone]. If 7PM-7AM, please contact night-coverage at [email or web address], password [facility] 04/27/2013, 9:13 AM  LOS: 6 days

## 2013-04-27 NOTE — Progress Notes (Signed)
CRITICAL VALUE ALERT  Critical value received:  K= 2.8  Date of notification:  04/27/13  Time of notification:  0635  Critical value read back:yes  Nurse who received alert:  Pattricia Boss   MD notified (1st page):  [phone]   Time of first page:  [phone]  MD notified (2nd page):  Time of second page:  Responding MD:  Lamar Blinks, NP  Time MD responded:  [phone]

## 2013-04-27 NOTE — Progress Notes (Signed)
ANTICOAGULATION CONSULT NOTE - FOLLOW UP  Pharmacy Consult for Xarelto Indication: atrial fibrillation/New DVT   Allergies  Allergen Reactions  . Aspirin Other (See Comments)    Patient states no reaction, only told not to take while on other blood thinner  . Levofloxacin Hives  . Other Itching and Rash  . Penicillins Hives and Itching    Tolerates Cefepime    Patient Measurements: Height: 5' (152.4 cm) Weight: 151 lb 7.3 oz (68.7 kg) IBW/kg (Calculated) : 45.5  Vital Signs: Temp: 99.2 F (37.3 C) (01/08 0404) Temp src: Oral (01/08 0404) BP: 105/66 mmHg (01/08 0404) Pulse Rate: 100 (01/08 0404) Intake/Output from previous day: 01/07 0701 - 01/08 0700 In: 370 [P.O.:320; IV Piggyback:50] Out: 6384 [Urine:1650; Stool:2] Intake/Output from this shift: Total I/O In: 120 [P.O.:120] Out: 201 [Urine:200; Stool:1]  Labs:  Recent Labs  04/26/13 0330 04/27/13 0357  WBC 6.4 6.4  HGB 7.9* 8.2*  PLT 156 210  CREATININE 0.76 0.68   Estimated Creatinine Clearance: 58.2 ml/min (by C-G formula based on Cr of 0.68).  Assessment: 67yof on Xarelto PTA for AFib. She has new DVT on dopplers and was resumed on same med. CBC trending up. Labs as above. CrCl ~ 58 mL/min  Goal of Therapy:  Stroke prevention, DVT recurrence prevention   Plan:  1) Xarelto 20 mg daily with Supper 2) F/u daily CBC, renal fx and s/s of bleeding  Aimee Sanders, PharmD.  Clinical Pharmacist Pager [phone]

## 2013-04-28 DIAGNOSIS — J4489 Other specified chronic obstructive pulmonary disease: Secondary | ICD-10-CM

## 2013-04-28 DIAGNOSIS — E876 Hypokalemia: Secondary | ICD-10-CM

## 2013-04-28 DIAGNOSIS — J449 Chronic obstructive pulmonary disease, unspecified: Secondary | ICD-10-CM

## 2013-04-28 LAB — BASIC METABOLIC PANEL
BUN: 16 mg/dL (ref 6–23)
CO2: 33 mEq/L — ABNORMAL HIGH (ref 19–32)
Calcium: 8.3 mg/dL — ABNORMAL LOW (ref 8.4–10.5)
Chloride: 103 mEq/L (ref 96–112)
Creatinine, Ser: 0.61 mg/dL (ref 0.50–1.10)
GFR calc Af Amer: >90 mL/min (ref >90)
Glucose, Bld: 81 mg/dL (ref 70–99)
POTASSIUM: 3.8 meq/L (ref 3.7–5.3)
Sodium: 143 mEq/L (ref 137–147)

## 2013-04-28 LAB — GLUCOSE, CAPILLARY
Glucose-Capillary: 77 mg/dL (ref 70–99)
Glucose-Capillary: 98 mg/dL (ref 70–99)
Glucose-Capillary: 162 mg/dL — ABNORMAL HIGH (ref 70–99)
Glucose-Capillary: 168 mg/dL — ABNORMAL HIGH (ref 70–99)

## 2013-04-28 LAB — CBC
HCT: 25.4 % — ABNORMAL LOW (ref 36.0–46.0)
Hemoglobin: 8.1 g/dL — ABNORMAL LOW (ref 12.0–15.0)
MCH: 31 pg (ref 26.0–34.0)
MCHC: 31.9 g/dL (ref 30.0–36.0)
MCV: 97.3 fL (ref 78.0–100.0)
Platelets: 256 10*3/uL (ref 150–400)
RBC: 2.61 MIL/uL — ABNORMAL LOW (ref 3.87–5.11)
RDW: 14.7 % (ref 11.5–15.5)
WBC: 8.1 10*3/uL (ref 4.0–10.5)

## 2013-04-28 MED ORDER — CEFUROXIME AXETIL 500 MG PO TABS
500.0000 mg | ORAL_TABLET | Freq: Two times a day (BID) | ORAL | Status: DC
  Administered 2013-04-28 – 2013-04-30 (×5): 500 mg via ORAL
  Filled 2013-04-28 (×6): qty 1

## 2013-04-28 NOTE — Progress Notes (Signed)
[facility] PROGRESS NOTE  Aimee Sanders BBC:[id number] DOB: 10/05/1945 DOA: 04/21/2013 PCP: Redge Gainer, MD  Assessment/Plan: 1. Acute respiratory failure: Secondary to CAP, COPD exacerbation, OSA. Improved. Continue antibiotics, oral steroids, nebulizers, oxygen.  2. Community acquired pneumonia: improved.  Switch to oral. Need coverage of CAP and ecoli bacteremia.  3. COPD exacerbation: continue steroids, antibiotics, nebs, O2 4. Sepsis: Due to E. Coli UTI and bacteremia.  Transition to cefuroxime (now d 7/14).  5. E. Coli bacteremia: seeded from urine. Continue antibiotics as above.  6. E. Coli UTI: continue antibiotics 7. Hypokalemia: low K this am, repleteing. Check mg. Recheck in am 8. Chronic systolic/diastolic CHF: stable. No change 9. Chronic DVT: continue zaroxolyn 10. CAD: no chest pain. Stable. Continue imdur, fenofibrate, lasix, lopressor 11. Herpes stomatitis: continue valtrex 12. Oral candidiasis: add nystatin oral 13. Anxiety/depression: continue celexa 14. Anemia: continue to monitor 15. Prophylaxis: xarelto, PPI  Code Status: full Family Communication: spoke with patient and son at bedside Disposition Plan: anticipate home tomorrow.   Consultants:  none  Procedures:  1/2 Venous Doppler LE >>> Technically limited study. There is a possible chronic DVT noted in a small area of the right profunda vein   Antibiotics: Vancomycin 1/2 >>> 1/4  Azithromycin 1/2 >>> 1/5  Cefepime 1/2 >>> 1/5  Tamiflu 1/2 >>> 1/3  Ceftriaxone 1/5 >>>1/9 Cefuroxime 1/9>>   HPI/Subjective: Feeling much better. Sitting up in chair. Resp status much improved.  Objective: Filed Vitals:   04/28/13 0429  BP: 127/57  Pulse: 90  Temp: 97.9 F (36.6 C)  Resp: 17    Intake/Output Summary (Last 24 hours) at 04/28/13 1517 Last data filed at 04/28/13 1400  Gross per 24 hour  Intake   2020 ml  Output    300 ml  Net   1720 ml   Filed Weights   04/21/13 0500 04/22/13  0400 04/24/13 0600  Weight: 70.217 kg (154 lb 12.8 oz) 68.3 kg (150 lb 9.2 oz) 68.7 kg (151 lb 7.3 oz)    Exam:   General:  Alert. NAD  Cardiovascular: rrr no mrg  Respiratory: CTAB, fair air movement  Abdomen: bs+, soft, non tender  Oral: mmm,  Data Reviewed: Basic Metabolic Panel:  Recent Labs Lab 04/23/13 0400 04/24/13 0411 04/26/13 0330 04/27/13 0357 04/27/13 1550 04/28/13 0845  NA 139 139 145 144 140 143  K 4.3 4.0 3.4* 2.8* 4.1 3.8  CL 106 104 105 100 97 103  CO2 23 26 30  35* 34* 33*  GLUCOSE 157* 123* 67* 71 239* 81  BUN 45* 40* 21 18 18 16   CREATININE 0.95 0.89 0.76 0.68 0.70 0.61  CALCIUM 8.6 8.8 8.6 8.5 8.6 8.3*  MG 2.4  --   --   --  1.8  --   PHOS 3.6  --   --   --   --   --    Liver Function Tests: No results found for this basename: AST, ALT, ALKPHOS, BILITOT, PROT, ALBUMIN,  in the last 168 hours No results found for this basename: LIPASE, AMYLASE,  in the last 168 hours No results found for this basename: AMMONIA,  in the last 168 hours CBC:  Recent Labs Lab 04/23/13 0400 04/24/13 0411 04/26/13 0330 04/27/13 0357 04/28/13 0845  WBC 17.2* 10.3 6.4 6.4 8.1  HGB 8.5* 8.4* 7.9* 8.2* 8.1*  HCT 25.9* 25.9* 24.2* 24.4* 25.4*  MCV 94.2 94.2 95.3 94.2 97.3  PLT 96* 114* 156 210 256   Cardiac Enzymes: No  results found for this basename: CKTOTAL, CKMB, CKMBINDEX, TROPONINI,  in the last 168 hours BNP (last 3 results)  Recent Labs  08/31/12 1303 12/08/12 1907 04/21/13 0110  PROBNP 2598.0* 1042.0* 9278.0*   CBG:  Recent Labs Lab 04/27/13 1543 04/27/13 1704 04/27/13 2022 04/28/13 0620 04/28/13 1137  GLUCAP 228* 201* 100* 77 98    Recent Results (from the past 240 hour(s))  CULTURE, BLOOD (ROUTINE X 2)     Status: None   Collection Time    04/21/13 12:44 AM      Result Value Range Status   Specimen Description BLOOD LEFT HAND   Final   Special Requests BOTTLES DRAWN AEROBIC ONLY 8CC   Final   Culture  Setup Time     Final    Value: 04/21/2013 09:03     Performed at [facility]   Culture     Final   Value: ESCHERICHIA COLI     Note: SUSCEPTIBILITIES PERFORMED ON PREVIOUS CULTURE WITHIN THE LAST 5 DAYS.     2 Note: Gram Stain Report Called to,Read Back By and Verified With: Audie Pinto RN 1 74 630P EDMOJ     Performed at [facility]   Report Status 04/23/2013 FINAL   Final  CULTURE, BLOOD (ROUTINE X 2)     Status: None   Collection Time    04/21/13  1:10 AM      Result Value Range Status   Specimen Description BLOOD LEFT ARM   Final   Special Requests BOTTLES DRAWN AEROBIC AND ANAEROBIC 10CC   Final   Culture  Setup Time     Final   Value: 04/21/2013 09:02     Performed at [facility]   Culture     Final   Value: ESCHERICHIA COLI     2 Note: Gram Stain Report Called to,Read Back By and Verified With: Audie Pinto RN 1 15 630P EDMOJ     Performed at [facility]   Report Status 04/23/2013 FINAL   Final   Organism ID, Bacteria ESCHERICHIA COLI   Final  URINE CULTURE     Status: None   Collection Time    04/21/13  2:50 AM      Result Value Range Status   Specimen Description URINE, CATHETERIZED   Final   Special Requests NONE   Final   Culture  Setup Time     Final   Value: 04/21/2013 03:30     Performed at [place]     Final   Value: >=100,000 COLONIES/ML     Performed at [facility]   Culture     Final   Value: ESCHERICHIA COLI     Performed at [facility]   Report Status 04/23/2013 FINAL   Final   Organism ID, Bacteria ESCHERICHIA COLI   Final  MRSA PCR SCREENING     Status: None   Collection Time    04/21/13  4:46 AM      Result Value Range Status   MRSA by PCR NEGATIVE  NEGATIVE Final   Comment:            The GeneXpert MRSA Assay (FDA     approved for NASAL specimens     only), is one component of a     comprehensive MRSA colonization     surveillance program. It is not     intended to diagnose MRSA  infection nor to guide or     monitor treatment for     MRSA infections.     Studies: No results found.  Scheduled Meds: . ALPRAZolam  0.5 mg Oral QID  . cefTRIAXone (ROCEPHIN)  IV  2 g Intravenous Q24H  . citalopram  20 mg Oral Daily  . fenofibrate  160 mg Oral Daily  . furosemide  40 mg Oral Daily  . insulin aspart  0-15 Units Subcutaneous TID WC  . insulin aspart  0-5 Units Subcutaneous QHS  . ipratropium-albuterol  3 mL Nebulization TID  . isosorbide mononitrate  30 mg Oral Daily  . metoprolol tartrate  12.5 mg Oral Daily  . nystatin  5 mL Oral QID  . pantoprazole  40 mg Oral Daily  . polyethylene glycol  17 g Oral Daily  . predniSONE  40 mg Oral Q breakfast  . rivaroxaban  20 mg Oral Q supper  . sodium chloride  10-40 mL Intracatheter Q12H  . valACYclovir  1,000 mg Oral TID   Continuous Infusions: . dextrose 5 % and 0.9% NaCl 20 mL/hr at 04/25/13 0100    Principal Problem:   Acute-on-chronic respiratory failure Active Problems:   HYPERLIPIDEMIA   C V A / STROKE   COPD with acute exacerbation   Chronic combined systolic and diastolic CHF (congestive heart failure)   CHF (congestive heart failure)   Pneumonia   Bacteremia   Sepsis   E-coli UTI   Herpes stomatitis   Nystagmus, unspecified    Time spent: 35 minutes    [facility] Pager [phone]. If 7PM-7AM, please contact night-coverage at [email or web address], password [facility] 04/28/2013, 3:17 PM  LOS: 7 days

## 2013-04-29 LAB — CBC
HCT: 24.2 % — ABNORMAL LOW (ref 36.0–46.0)
Hemoglobin: 7.6 g/dL — ABNORMAL LOW (ref 12.0–15.0)
MCH: 30.3 pg (ref 26.0–34.0)
MCHC: 31.4 g/dL (ref 30.0–36.0)
MCV: 96.4 fL (ref 78.0–100.0)
PLATELETS: 296 10*3/uL (ref 150–400)
RBC: 2.51 MIL/uL — ABNORMAL LOW (ref 3.87–5.11)
RDW: 14.3 % (ref 11.5–15.5)
WBC: 7.4 10*3/uL (ref 4.0–10.5)

## 2013-04-29 LAB — GLUCOSE, CAPILLARY
GLUCOSE-CAPILLARY: 108 mg/dL — AB (ref 70–99)
Glucose-Capillary: 144 mg/dL — ABNORMAL HIGH (ref 70–99)
Glucose-Capillary: 169 mg/dL — ABNORMAL HIGH (ref 70–99)
Glucose-Capillary: 135 mg/dL — ABNORMAL HIGH (ref 70–99)

## 2013-04-29 LAB — BASIC METABOLIC PANEL
BUN: 14 mg/dL (ref 6–23)
CALCIUM: 8.5 mg/dL (ref 8.4–10.5)
CO2: 37 mEq/L — ABNORMAL HIGH (ref 19–32)
Chloride: 100 mEq/L (ref 96–112)
Creatinine, Ser: 0.57 mg/dL (ref 0.50–1.10)
GFR calc Af Amer: >90 mL/min (ref >90)
GFR calc non Af Amer: >90 mL/min (ref >90)
Glucose, Bld: 78 mg/dL (ref 70–99)
Potassium: 3.7 mEq/L (ref 3.7–5.3)
SODIUM: 143 meq/L (ref 137–147)

## 2013-04-29 MED ORDER — GI COCKTAIL ~~LOC~~: 30.0000 mL | Freq: Once | ORAL | Status: DC

## 2013-04-29 NOTE — Progress Notes (Signed)
[facility] PROGRESS NOTE  Aimee Sanders CBJ:[id number] DOB: 1945/09/16 DOA: 04/21/2013 PCP: Redge Gainer, MD  Assessment/Plan: 1. Acute respiratory failure: Secondary to CAP, COPD exacerbation, OSA. Improved. Continue antibiotics, oral steroids, nebulizers, oxygen.  2. Community acquired pneumonia: improved.  Switch to oral cefuroxime to cover CAP and g-bact. 3. COPD exacerbation: continue steroids, antibiotics, nebs, O2 4. Sepsis: Due to E. Coli UTI and bacteremia.  Transition to cefuroxime (now d 8/14).  5. E. Coli bacteremia: seeded from urine. Continue antibiotics as above.  6. E. Coli UTI: continue antibiotics 7. Hypokalemia: low K this am, repleteing. Check mg. Recheck in am 8. Chronic systolic/diastolic CHF: stable. No change 9. Chronic DVT: continue zaroxolyn 10. CAD: no chest pain. Stable. Continue imdur, fenofibrate, lasix, lopressor 11. Herpes stomatitis: continue valtrex 12. Oral candidiasis: add nystatin oral 13. Anxiety/depression: continue celexa 14. Anemia: hgb has dropped today. I suspect this is AOCD. Have reviewed the GI notes from last admission, she is indeed a poor candidate for further colonoscopy or EGD.  Hbg today is not far off baseline. Would  Re-evaluate as outpatient. Ok for now to remain on anticoagulant. 15. Prophylaxis: xarelto, PPI  Code Status: full Family Communication: spoke with patient  Disposition Plan: anticipate home tomorrow.   Consultants:  none  Procedures:  1/2 Venous Doppler LE >>> Technically limited study. There is a possible chronic DVT noted in a small area of the right profunda vein   Antibiotics: Vancomycin 1/2 >>> 1/4  Azithromycin 1/2 >>> 1/5  Cefepime 1/2 >>> 1/5  Tamiflu 1/2 >>> 1/3  Ceftriaxone 1/5 >>>1/9 Cefuroxime 1/9>>   HPI/Subjective: Weak and shaky this morning. GERD flare.  Objective: Filed Vitals:   04/29/13 0403  BP: 115/62  Pulse: 87  Temp: 98.7 F (37.1 C)  Resp: 19    Intake/Output  Summary (Last 24 hours) at 04/29/13 0939 Last data filed at 04/29/13 0900  Gross per 24 hour  Intake   1770 ml  Output    700 ml  Net   1070 ml   Filed Weights   04/22/13 0400 04/24/13 0600 04/29/13 0403  Weight: 68.3 kg (150 lb 9.2 oz) 68.7 kg (151 lb 7.3 oz) 65.3 kg (143 lb 15.4 oz)    Exam:   General:  Alert. NAD  Cardiovascular: rrr no mrg  Respiratory: scattered rhonchi, fair air movement  Abdomen: bs+, soft, non tender  Oral: mmm, thrush improving  Data Reviewed: Basic Metabolic Panel:  Recent Labs Lab 04/23/13 0400  04/26/13 0330 04/27/13 0357 04/27/13 1550 04/28/13 0845 04/29/13 0500  NA 139  < > 145 144 140 143 143  K 4.3  < > 3.4* 2.8* 4.1 3.8 3.7  CL 106  < > 105 100 97 103 100  CO2 23  < > 30 35* 34* 33* 37*  GLUCOSE 157*  < > 67* 71 239* 81 78  BUN 45*  < > 21 18 18 16 14   CREATININE 0.95  < > 0.76 0.68 0.70 0.61 0.57  CALCIUM 8.6  < > 8.6 8.5 8.6 8.3* 8.5  MG 2.4  --   --   --  1.8  --   --   PHOS 3.6  --   --   --   --   --   --   < > = values in this interval not displayed. Liver Function Tests: No results found for this basename: AST, ALT, ALKPHOS, BILITOT, PROT, ALBUMIN,  in the last 168 hours No results found for  this basename: LIPASE, AMYLASE,  in the last 168 hours No results found for this basename: AMMONIA,  in the last 168 hours CBC:  Recent Labs Lab 04/24/13 0411 04/26/13 0330 04/27/13 0357 04/28/13 0845 04/29/13 0500  WBC 10.3 6.4 6.4 8.1 7.4  HGB 8.4* 7.9* 8.2* 8.1* 7.6*  HCT 25.9* 24.2* 24.4* 25.4* 24.2*  MCV 94.2 95.3 94.2 97.3 96.4  PLT 114* 156 210 256 296   Cardiac Enzymes: No results found for this basename: CKTOTAL, CKMB, CKMBINDEX, TROPONINI,  in the last 168 hours BNP (last 3 results)  Recent Labs  08/31/12 1303 12/08/12 1907 04/21/13 0110  PROBNP 2598.0* 1042.0* 9278.0*   CBG:  Recent Labs Lab 04/28/13 0620 04/28/13 1137 04/28/13 1616 04/28/13 2050 04/29/13 0613  GLUCAP 77 98 162* 168* 108*     Recent Results (from the past 240 hour(s))  CULTURE, BLOOD (ROUTINE X 2)     Status: None   Collection Time    04/21/13 12:44 AM      Result Value Range Status   Specimen Description BLOOD LEFT HAND   Final   Special Requests BOTTLES DRAWN AEROBIC ONLY 8CC   Final   Culture  Setup Time     Final   Value: 04/21/2013 09:03     Performed at [facility]   Culture     Final   Value: ESCHERICHIA COLI     Note: SUSCEPTIBILITIES PERFORMED ON PREVIOUS CULTURE WITHIN THE LAST 5 DAYS.     2 Note: Gram Stain Report Called to,Read Back By and Verified With: Audie Pinto RN 1 40 630P EDMOJ     Performed at [facility]   Report Status 04/23/2013 FINAL   Final  CULTURE, BLOOD (ROUTINE X 2)     Status: None   Collection Time    04/21/13  1:10 AM      Result Value Range Status   Specimen Description BLOOD LEFT ARM   Final   Special Requests BOTTLES DRAWN AEROBIC AND ANAEROBIC 10CC   Final   Culture  Setup Time     Final   Value: 04/21/2013 09:02     Performed at [facility]   Culture     Final   Value: ESCHERICHIA COLI     2 Note: Gram Stain Report Called to,Read Back By and Verified With: Audie Pinto RN 1 15 630P EDMOJ     Performed at [facility]   Report Status 04/23/2013 FINAL   Final   Organism ID, Bacteria ESCHERICHIA COLI   Final  URINE CULTURE     Status: None   Collection Time    04/21/13  2:50 AM      Result Value Range Status   Specimen Description URINE, CATHETERIZED   Final   Special Requests NONE   Final   Culture  Setup Time     Final   Value: 04/21/2013 03:30     Performed at [facility]     Final   Value: >=100,000 COLONIES/ML     Performed at [facility]   Culture     Final   Value: ESCHERICHIA COLI     Performed at [facility]   Report Status 04/23/2013 FINAL   Final   Organism ID, Bacteria ESCHERICHIA COLI   Final  MRSA PCR SCREENING     Status: None   Collection Time     04/21/13  4:46 AM  Result Value Range Status   MRSA by PCR NEGATIVE  NEGATIVE Final   Comment:            The GeneXpert MRSA Assay (FDA     approved for NASAL specimens     only), is one component of a     comprehensive MRSA colonization     surveillance program. It is not     intended to diagnose MRSA     infection nor to guide or     monitor treatment for     MRSA infections.     Studies: No results found.  Scheduled Meds: . ALPRAZolam  0.5 mg Oral QID  . cefUROXime  500 mg Oral BID WC  . citalopram  20 mg Oral Daily  . fenofibrate  160 mg Oral Daily  . furosemide  40 mg Oral Daily  . insulin aspart  0-15 Units Subcutaneous TID WC  . insulin aspart  0-5 Units Subcutaneous QHS  . ipratropium-albuterol  3 mL Nebulization TID  . isosorbide mononitrate  30 mg Oral Daily  . metoprolol tartrate  12.5 mg Oral Daily  . nystatin  5 mL Oral QID  . pantoprazole  40 mg Oral Daily  . polyethylene glycol  17 g Oral Daily  . predniSONE  40 mg Oral Q breakfast  . rivaroxaban  20 mg Oral Q supper  . sodium chloride  10-40 mL Intracatheter Q12H  . valACYclovir  1,000 mg Oral TID   Continuous Infusions: . dextrose 5 % and 0.9% NaCl 20 mL/hr at 04/28/13 1930    Principal Problem:   Acute-on-chronic respiratory failure Active Problems:   HYPERLIPIDEMIA   C V A / STROKE   COPD with acute exacerbation   Chronic combined systolic and diastolic CHF (congestive heart failure)   CHF (congestive heart failure)   Pneumonia   Bacteremia   Sepsis   E-coli UTI   Herpes stomatitis   Nystagmus, unspecified    Time spent: 35 minutes    [facility] Pager [phone]. If 7PM-7AM, please contact night-coverage at [email or web address], password [email or web address] 04/29/2013, 9:39 AM  LOS: 8 days

## 2013-04-30 LAB — CBC
HCT: 24.2 % — ABNORMAL LOW (ref 36.0–46.0)
Hemoglobin: 7.6 g/dL — ABNORMAL LOW (ref 12.0–15.0)
MCH: 30.6 pg (ref 26.0–34.0)
MCHC: 31.4 g/dL (ref 30.0–36.0)
MCV: 97.6 fL (ref 78.0–100.0)
Platelets: 319 10*3/uL (ref 150–400)
RBC: 2.48 MIL/uL — ABNORMAL LOW (ref 3.87–5.11)
RDW: 14.6 % (ref 11.5–15.5)
WBC: 8.8 10*3/uL (ref 4.0–10.5)

## 2013-04-30 LAB — BASIC METABOLIC PANEL
BUN: 13 mg/dL (ref 6–23)
CO2: 37 mEq/L — ABNORMAL HIGH (ref 19–32)
Calcium: 8.5 mg/dL (ref 8.4–10.5)
Chloride: 97 mEq/L (ref 96–112)
Creatinine, Ser: 0.58 mg/dL (ref 0.50–1.10)
GFR calc non Af Amer: >90 mL/min (ref >90)
Glucose, Bld: 75 mg/dL (ref 70–99)
POTASSIUM: 3.6 meq/L — AB (ref 3.7–5.3)
SODIUM: 140 meq/L (ref 137–147)

## 2013-04-30 LAB — GLUCOSE, CAPILLARY
GLUCOSE-CAPILLARY: 125 mg/dL — AB (ref 70–99)
Glucose-Capillary: 70 mg/dL (ref 70–99)

## 2013-04-30 MED ORDER — GUAIFENESIN 100 MG/5ML PO SYRP
200.0000 mg | ORAL_SOLUTION | ORAL | Status: AC | PRN
Start: 2013-04-30 — End: ?

## 2013-04-30 MED ORDER — CEFUROXIME AXETIL 500 MG PO TABS: 500.0000 mg | ORAL_TABLET | Freq: Two times a day (BID) | ORAL | Status: AC

## 2013-04-30 MED ORDER — VALACYCLOVIR HCL 1 G PO TABS: 1000.0000 mg | ORAL_TABLET | Freq: Three times a day (TID) | ORAL | Status: AC

## 2013-04-30 NOTE — Progress Notes (Signed)
Clinical Education officer, museum (CSW) arranged non-emergency EMS ([facility]) transport for patient. Patient is returning to her private residence and CSW confirmed with the patient that the address listed in [place] is the address she wants [facility] to transport her to. Patient asked CSW to contact her husband and make him aware of above. CSW spoke with patient's husband on their home phone [phone] and made him aware of above. Husband thanked CSW for calling. Nursing is aware of above. Please reconsult if further social work needs arise. CSW signing off.   Blima Rich, [place] Weekend CSW [phone]

## 2013-04-30 NOTE — Discharge Instructions (Signed)
Follow up with your doctor within 1-2 weeks of discharge.

## 2013-04-30 NOTE — Progress Notes (Signed)
ANTICOAGULATION CONSULT NOTE - FOLLOW UP  Pharmacy Consult for Xarelto Indication: atrial fibrillation/chronic DVT   Allergies  Allergen Reactions  . Aspirin Other (See Comments)    Patient states no reaction, only told not to take while on other blood thinner  . Levofloxacin Hives  . Other Itching and Rash  . Penicillins Hives and Itching    Tolerates Cefepime    Patient Measurements: Height: 5' (152.4 cm) Weight: 144 lb 6.4 oz (65.5 kg) IBW/kg (Calculated) : 45.5  Vital Signs: Temp: 98.1 F (36.7 C) (01/11 0423) Temp src: Oral (01/11 0423) BP: 115/78 mmHg (01/11 0423) Pulse Rate: 81 (01/11 0423) Intake/Output from previous day: 01/10 0701 - 01/11 0700 In: 960 [P.O.:960] Out: 1000 [Urine:1000] Intake/Output from this shift: Total I/O In: 240 [P.O.:240] Out: -   Labs:  Recent Labs  04/28/13 0845 04/29/13 0500 04/30/13 0450  WBC 8.1 7.4 8.8  HGB 8.1* 7.6* 7.6*  PLT 256 296 319  CREATININE 0.61 0.57 0.58   Estimated Creatinine Clearance: 56.8 ml/min (by C-G formula based on Cr of 0.58).  Assessment: 67yof on Xarelto PTA for AFib. She has chronic DVT on dopplers and was resumed on same med. Hgb trended down to 7.6. Per IM, suspect this is AOCD. Labs as above. CrCl ~ 56 mL/min  Goal of Therapy:  Stroke prevention, DVT recurrence prevention   Plan:  1) Xarelto 20 mg daily with Supper 2) F/u daily CBC, renal fx and s/s of bleeding  Albertina Parr, PharmD.  Clinical Pharmacist Pager [phone]

## 2013-04-30 NOTE — Progress Notes (Signed)
Central line removed per order and protocol.  All sutures removed, occlusive dressing applied, pressure held for 10 mins.  Pt understands bedrest for 30 mins supine, resting comfortably.  Call bell in reach, will monitor closely.

## 2013-04-30 NOTE — Progress Notes (Signed)
Pt discharging to home via EMS, call made per SW.  Instructions and medications given and reviewed, all questions answered.

## 2013-04-30 NOTE — Discharge Summary (Signed)
Physician Discharge Summary  Aimee Sanders [id number] DOB: 1946-01-30 DOA: 04/21/2013  PCP: Redge Gainer, MD  Admit date: 04/21/2013 Discharge date: 04/30/2013  Time spent: 50 minutes  Recommendations for Outpatient Follow-up:  1. Follow up with PCP within 2 weeks 2. In home physical therapy 3. Follow up with dermatology for left groin growth  Discharge Diagnoses:  Principal Problem:   Acute-on-chronic respiratory failure Active Problems:   HYPERLIPIDEMIA   C V A / STROKE   COPD with acute exacerbation   Chronic combined systolic and diastolic CHF (congestive heart failure)   CHF (congestive heart failure)   Pneumonia   Bacteremia   Sepsis   E-coli UTI   Herpes stomatitis   Nystagmus, unspecified   Discharge Condition: fair  Diet recommendation: heart healthy  Filed Weights   04/24/13 0600 04/29/13 0403 04/30/13 0423  Weight: 68.7 kg (151 lb 7.3 oz) 65.3 kg (143 lb 15.4 oz) 65.5 kg (144 lb 6.4 oz)    History of present illness:  68 yo female former smoker has several days of cough, sputum, and wheezing. She has been feeling weak. She has fever up to 102F. She c/o chest tightness/discomfort and feeling nauseous. She was getting more short of breath and came to the emergency room. She was given solumedrol, albuterol nebulizer and BiPAP >> these improved her dyspnea. She was also noted to have low blood pressure, and there was concern for sepsis from pneumonia.   Hospital Course:  1. Acute respiratory failure: Treated initially in ICU without intubation, BiPAP only, in the ICU.  Due to community acquired pneumonia, COPD exacerbation, OSA.  At the time of discharge she has no respiratory difficulty.  2. Community acquired pneumonia: Respiratory status stable on discharge. Continue with cefuroxime.  3. Ecoli UTI and bacteremia: Continue cefuroxime for total of 14 d antibiotic coverage 4. COPD exacerbation: resolved. Continue steroid taper, antibiotics, home  nebulizers 5. Chronic systolic/diastolic CHF: stable. No change  6. Chronic DVT: continue zaroxolyn  7. CAD: no chest pain. Stable. Continue imdur, fenofibrate, lasix, lopressor 8. Herpes stomatitis: continue valtrex  9. Oral candidiasis: nystatin oral  10. Anxiety/depression: continue celexa  11. Anemia: AOCD. Have reviewed the GI notes from last admission, she is indeed a poor candidate for further colonoscopy or EGD. Would Re-evaluate as outpatient. Ok for now to remain on anticoagulant.  12. 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 She has a dermatology appointment to follow.  Procedures: 1/2 Venous Doppler LE >>> Technically limited study. There is a possible chronic DVT noted in a small area of the right profunda vein Central line in left JV   Consultations:  none  Discharge Exam: Filed Vitals:   04/30/13 0423  BP: 115/78  Pulse: 81  Temp: 98.1 F (36.7 C)  Resp: 18    General: alert, fatigued, no distress Cardiovascular: RRR no MRG Respiratory: diffuse crackles, good air movement  Discharge Instructions  Discharge Orders   Future Appointments Provider Department Dept Phone   05/15/2013 2:20 PM Harl Bowie, MD [facility], [place] [phone]   Future Orders Complete By Expires   Call MD for:  difficulty breathing, headache or visual disturbances  As directed    Call MD for:  persistant dizziness or light-headedness  As directed    Call MD for:  temperature >100.4  As directed    Diet - low sodium heart healthy  As directed    Increase activity slowly  As directed  Medication List         acetaminophen 500 MG tablet  Commonly known as:  TYLENOL  Take 1,000-1,500 mg by mouth every 6 (six) hours as needed for mild pain.     albuterol 108 (90 BASE) MCG/ACT inhaler  Commonly known as:  PROVENTIL HFA;VENTOLIN HFA  Inhale 2 puffs into the lungs every 6 (six) hours as needed for wheezing.     albuterol (2.5 MG/3ML) 0.083%  nebulizer solution  Commonly known as:  PROVENTIL  Take 3 mLs (2.5 mg total) by nebulization every 6 (six) hours as needed for wheezing.     ALPRAZolam 0.5 MG tablet  Commonly known as:  XANAX  Take 1 tablet (0.5 mg total) by mouth 4 (four) times daily.     cefUROXime 500 MG tablet  Commonly known as:  CEFTIN  Take 1 tablet (500 mg total) by mouth 2 (two) times daily with a meal.     citalopram 20 MG tablet  Commonly known as:  CELEXA  Take 1 tablet (20 mg total) by mouth daily.     cyclobenzaprine 5 MG tablet  Commonly known as:  FLEXERIL  Take 5 mg by mouth 3 (three) times daily as needed for muscle spasms.     fenofibrate 160 MG tablet  Take 1 tablet (160 mg total) by mouth daily.     ferrous sulfate 325 (65 FE) MG tablet  Take 325 mg by mouth daily.     Fluticasone-Salmeterol 250-50 MCG/DOSE Aepb  Commonly known as:  ADVAIR  Inhale 1 puff into the lungs daily as needed (shortness of breath).     folic acid 1 MG tablet  Commonly known as:  FOLVITE  Take 1 tablet (1 mg total) by mouth daily.     furosemide 40 MG tablet  Commonly known as:  LASIX  Take 40 mg by mouth See admin instructions. Takes one tablet every morning. Can take another tablet if needed for swelling.     gabapentin 300 MG capsule  Commonly known as:  NEURONTIN  Take 300-600 mg by mouth 4 (four) times daily. Takes 1 capsule in morning, 1 capsule at noon, 1capsule in evening and 2 capsules at bedtime, every day.     guaifenesin 100 MG/5ML syrup  Commonly known as:  ROBITUSSIN  Take 10 mLs (200 mg total) by mouth every 4 (four) hours as needed for congestion.     ipratropium-albuterol 0.5-2.5 (3) MG/3ML Soln  Commonly known as:  DUONEB  Take 3 mLs by nebulization 3 (three) times daily. For shortness of breath or wheezing     isosorbide mononitrate 30 MG 24 hr tablet  Commonly known as:  IMDUR  Take 1 tablet (30 mg total) by mouth daily.     metoprolol tartrate 25 MG tablet  Commonly known as:   LOPRESSOR  Take 0.5 tablets (12.5 mg total) by mouth daily.     montelukast 10 MG tablet  Commonly known as:  SINGULAIR  Take 1 tablet (10 mg total) by mouth daily.     nitroGLYCERIN 0.4 MG SL tablet  Commonly known as:  NITROSTAT  Place 0.4 mg under the tongue every 5 (five) minutes as needed. For chest pain     nystatin 100000 UNIT/ML suspension  Commonly known as:  MYCOSTATIN  Take 5 mL (500,000 Units total) by mouth 4 (four) times daily. Use 48 hours after symptoms resolved     omeprazole 20 MG capsule  Commonly known as:  PRILOSEC  Take 1 capsule (  20 mg total) by mouth daily.     ondansetron 4 MG tablet  Commonly known as:  ZOFRAN  Take 4 mg by mouth every 6 (six) hours as needed for nausea or vomiting.     oxyCODONE-acetaminophen 10-325 MG per tablet  Commonly known as:  PERCOCET  Take 1 tablet by mouth every 8 (eight) hours as needed for pain.     predniSONE 10 MG tablet  Commonly known as:  DELTASONE  Take 5-40 mg by mouth See admin instructions. Take 4 tablets for 3 days. Then take 3 tablets for 3 days. Then 2 tablets for 3 days. Then 1 tablet for 3 days. Then take 1/2 tablet for 3 days then start over.     Rivaroxaban 20 MG Tabs tablet  Commonly known as:  XARELTO  Take 1 tablet (20 mg total) by mouth daily.     SPIRIVA HANDIHALER 18 MCG inhalation capsule  Generic drug:  tiotropium  Place 18 mcg into inhaler and inhale daily.     tobramycin-dexamethasone ophthalmic solution  Commonly known as:  TOBRADEX  Place 1 drop into both eyes every 4 (four) hours while awake.     valACYclovir 1000 MG tablet  Commonly known as:  VALTREX  Take 1 tablet (1,000 mg total) by mouth 3 (three) times daily.       Allergies  Allergen Reactions  . Aspirin Other (See Comments)    Patient states no reaction, only told not to take while on other blood thinner  . Levofloxacin Hives  . Other Itching and Rash  . Penicillins Hives and Itching    Tolerates Cefepime        Follow-up Information   Follow up with Redge Gainer, MD In 1 week.   Specialty:  Family Medicine   Contact information:   [address] [place] [address] [phone]        The results of significant diagnostics from this hospitalization (including imaging, microbiology, ancillary and laboratory) are listed below for reference.    Significant Diagnostic Studies: Dg Chest Port 1 View  04/24/2013   CLINICAL DATA:  Followup pneumonia.  EXAM: PORTABLE CHEST - 1 VIEW  COMPARISON:  04/22/2013.  FINDINGS: Rotation to the left.  Left central line tip proximal superior vena cava level. Cement augmentation limits evaluation of the exact position of central line.  Cardiomegaly.  Calcified tortuous aorta.  Bibasilar consolidation greater on the left may represent pleural fluid with atelectasis and/ infiltrate and more notable on the left. Findings have changed minimally since prior exam taking into account differences in patient position.  Pulmonary vascular congestion.  No gross pneumothorax.  IMPRESSION: Bibasilar consolidation greater on the left may represent pleural fluid with atelectasis and/ infiltrate and more notable on the left. Findings have changed minimally since prior exam taking into account differences in patient position.  Pulmonary vascular congestion.   Electronically Signed   By: Chauncey Cruel M.D.   On: 04/24/2013 07:55   Dg Chest Port 1 View  04/22/2013   CLINICAL DATA:  Followup pneumonia  EXAM: PORTABLE CHEST - 1 VIEW  COMPARISON:  Prior chest x-ray 04/21/2013  FINDINGS: Stable left IJ approach central venous catheter. Catheter tip projects over the mid SVC. Unchanged enlargement of the cardiopericardial silhouette. Atherosclerotic calcifications are noted in the transverse aorta. Increasing pulmonary vascular congestion now with mild edema. Developing right lower lobe opacity. Persistent left layering pleural effusion with increasing basilar opacity. Multi level vertebral  cement O plasties noted incidentally.  IMPRESSION: 1. Increased pulmonary vascular congestion now with mild interstitial edema. 2. Increasing bibasilar opacities favored to reflect a combination of atelectasis and edema. Superimposed infection difficult to exclude entirely. 3. Stable small left pleural effusion.   Electronically Signed   By: Jacqulynn Cadet M.D.   On: 04/22/2013 08:34   Dg Chest Port 1 View  04/21/2013   CLINICAL DATA:  CVL placement  EXAM: PORTABLE CHEST - 1 VIEW  COMPARISON:  04/21/2013 at 0052 hr  FINDINGS: Technically limited study due to patient rotation. There has been placement of a left central venous catheter. The tip overlies the low SVC. No pneumothorax. Small left pleural effusion with basilar atelectasis or infiltration. The heart size and pulmonary vascularity not appear enlarged. Previous thoracic kyphoplasty is.  IMPRESSION: Limited study due to patient rotation. Central venous catheter tip appears to be located over the low SVC region. No pneumothorax.   Electronically Signed   By: Lucienne Capers M.D.   On: 04/21/2013 05:30   Dg Chest Portable 1 View  04/21/2013   CLINICAL DATA:  Respiratory distress  EXAM: PORTABLE CHEST - 1 VIEW  COMPARISON:  12/13/2012  FINDINGS: Shallow inspiration. Heart size and pulmonary vascularity are normal. Persistent left lower lobe opacity suggesting atelectasis. A central obstructing lesion should be excluded due to the persistence of this finding. CT is recommended. No blunting of costophrenic angles. No pneumothorax. Calcification of the aorta. Previous thoracic kyphoplasty.  IMPRESSION: Persistent atelectasis in the left lung base. Consider CT to exclude a centrally obstructing lesion.   Electronically Signed   By: Lucienne Capers M.D.   On: 04/21/2013 01:28    Microbiology: Recent Results (from the past 240 hour(s))  CULTURE, BLOOD (ROUTINE X 2)     Status: None   Collection Time    04/21/13 12:44 AM      Result Value Range Status    Specimen Description BLOOD LEFT HAND   Final   Special Requests BOTTLES DRAWN AEROBIC ONLY 8CC   Final   Culture  Setup Time     Final   Value: 04/21/2013 09:03     Performed at [facility]   Culture     Final   Value: ESCHERICHIA COLI     Note: SUSCEPTIBILITIES PERFORMED ON PREVIOUS CULTURE WITHIN THE LAST 5 DAYS.     2 Note: Gram Stain Report Called to,Read Back By and Verified With: Audie Pinto RN 1 43 630P EDMOJ     Performed at [facility]   Report Status 04/23/2013 FINAL   Final  CULTURE, BLOOD (ROUTINE X 2)     Status: None   Collection Time    04/21/13  1:10 AM      Result Value Range Status   Specimen Description BLOOD LEFT ARM   Final   Special Requests BOTTLES DRAWN AEROBIC AND ANAEROBIC 10CC   Final   Culture  Setup Time     Final   Value: 04/21/2013 09:02     Performed at [facility]   Culture     Final   Value: ESCHERICHIA COLI     2 Note: Gram Stain Report Called to,Read Back By and Verified With: Audie Pinto RN 1 15 630P EDMOJ     Performed at [facility]   Report Status 04/23/2013 FINAL   Final   Organism ID, Bacteria ESCHERICHIA COLI   Final  URINE CULTURE     Status: None   Collection Time    04/21/13  2:50 AM      Result Value Range Status   Specimen Description URINE, CATHETERIZED   Final   Special Requests NONE   Final   Culture  Setup Time     Final   Value: 04/21/2013 03:30     Performed at [facility]     Final   Value: >=100,000 COLONIES/ML     Performed at [facility]   Culture     Final   Value: ESCHERICHIA COLI     Performed at [facility]   Report Status 04/23/2013 FINAL   Final   Organism ID, Bacteria ESCHERICHIA COLI   Final  MRSA PCR SCREENING     Status: None   Collection Time    04/21/13  4:46 AM      Result Value Range Status   MRSA by PCR NEGATIVE  NEGATIVE Final   Comment:            The GeneXpert MRSA Assay (FDA     approved for NASAL  specimens     only), is one component of a     comprehensive MRSA colonization     surveillance program. It is not     intended to diagnose MRSA     infection nor to guide or     monitor treatment for     MRSA infections.     Labs: Basic Metabolic Panel:  Recent Labs Lab 04/27/13 0357 04/27/13 1550 04/28/13 0845 04/29/13 0500 04/30/13 0450  NA 144 140 143 143 140  K 2.8* 4.1 3.8 3.7 3.6*  CL 100 97 103 100 97  CO2 35* 34* 33* 37* 37*  GLUCOSE 71 239* 81 78 75  BUN 18 18 16 14 13   CREATININE 0.68 0.70 0.61 0.57 0.58  CALCIUM 8.5 8.6 8.3* 8.5 8.5  MG  --  1.8  --   --   --    Liver Function Tests: No results found for this basename: AST, ALT, ALKPHOS, BILITOT, PROT, ALBUMIN,  in the last 168 hours No results found for this basename: LIPASE, AMYLASE,  in the last 168 hours No results found for this basename: AMMONIA,  in the last 168 hours CBC:  Recent Labs Lab 04/26/13 0330 04/27/13 0357 04/28/13 0845 04/29/13 0500 04/30/13 0450  WBC 6.4 6.4 8.1 7.4 8.8  HGB 7.9* 8.2* 8.1* 7.6* 7.6*  HCT 24.2* 24.4* 25.4* 24.2* 24.2*  MCV 95.3 94.2 97.3 96.4 97.6  PLT 156 210 256 296 319   Cardiac Enzymes: No results found for this basename: CKTOTAL, CKMB, CKMBINDEX, TROPONINI,  in the last 168 hours BNP: BNP (last 3 results)  Recent Labs  08/31/12 1303 12/08/12 1907 04/21/13 0110  PROBNP 2598.0* 1042.0* 9278.0*   CBG:  Recent Labs Lab 04/29/13 1127 04/29/13 1621 04/29/13 2110 04/30/13 0627 04/30/13 1108  GLUCAP 144* 169* 135* 70 125*       Signed:  WALSH,CATHERINE  [facility] 04/30/2013, 1:07 PM

## 2013-05-05 ENCOUNTER — Telehealth: Payer: Self-pay | Admitting: Nurse Practitioner

## 2013-05-05 ENCOUNTER — Other Ambulatory Visit: Payer: Self-pay | Admitting: Nurse Practitioner

## 2013-05-05 NOTE — Telephone Encounter (Signed)
Shouldn't need another antibiotic

## 2013-05-08 ENCOUNTER — Telehealth: Payer: Self-pay | Admitting: Nurse Practitioner

## 2013-05-08 ENCOUNTER — Other Ambulatory Visit: Payer: Self-pay | Admitting: Nurse Practitioner

## 2013-05-08 DIAGNOSIS — J449 Chronic obstructive pulmonary disease, unspecified: Secondary | ICD-10-CM

## 2013-05-08 DIAGNOSIS — G8929 Other chronic pain: Secondary | ICD-10-CM

## 2013-05-08 DIAGNOSIS — M549 Dorsalgia, unspecified: Principal | ICD-10-CM

## 2013-05-08 MED ORDER — OXYCODONE-ACETAMINOPHEN 10-325 MG PO TABS: 1.0000 | ORAL_TABLET | Freq: Three times a day (TID) | ORAL | Status: DC | PRN

## 2013-05-08 NOTE — Telephone Encounter (Signed)
rx ready for pickup 

## 2013-05-08 NOTE — Telephone Encounter (Signed)
Please call in xanax rx 

## 2013-05-08 NOTE — Telephone Encounter (Signed)
Rx called in to pharmacy. 

## 2013-05-08 NOTE — Telephone Encounter (Signed)
Patient last seen in office on 11-17-12. Rx last filled on 04-08-13 for #120. Please advise. If approved please route to Pool  B so nurse can call in to pharmacy

## 2013-05-08 NOTE — Progress Notes (Signed)
Patient aware rx up front 

## 2013-05-10 ENCOUNTER — Telehealth: Payer: Self-pay | Admitting: Nurse Practitioner

## 2013-05-10 ENCOUNTER — Other Ambulatory Visit (HOSPITAL_COMMUNITY): Payer: Self-pay | Admitting: Internal Medicine

## 2013-05-11 NOTE — Telephone Encounter (Signed)
No message to address °

## 2013-05-15 ENCOUNTER — Other Ambulatory Visit: Payer: Self-pay | Admitting: Nurse Practitioner

## 2013-05-15 ENCOUNTER — Telehealth: Payer: Self-pay | Admitting: Nurse Practitioner

## 2013-05-15 ENCOUNTER — Ambulatory Visit (INDEPENDENT_AMBULATORY_CARE_PROVIDER_SITE_OTHER): Payer: PRIVATE HEALTH INSURANCE | Admitting: Surgery

## 2013-05-16 NOTE — Telephone Encounter (Signed)
Pt aware, that ins. Will not pay, she must have had it 3 years. [facility] will charge her $55 for a new one

## 2013-05-17 ENCOUNTER — Encounter (INDEPENDENT_AMBULATORY_CARE_PROVIDER_SITE_OTHER): Payer: Self-pay | Admitting: Surgery

## 2013-05-25 ENCOUNTER — Telehealth: Payer: Self-pay | Admitting: Family Medicine

## 2013-05-25 ENCOUNTER — Telehealth: Payer: Self-pay | Admitting: *Deleted

## 2013-05-25 NOTE — Telephone Encounter (Signed)
[facility] is working with pt, since her release from hospital for pneumonia on 05/23/13. She has 3 new scripts, she is so very nauseas and has vomited 11 times since yesterday. She was sent home on Levaquin, has one dose left. Clindamycin 300 qid, and morphine 15mg , she has only taken 1 morphine?  Could it be the meds

## 2013-05-25 NOTE — Telephone Encounter (Signed)
Vomiting could be from meds

## 2013-05-26 NOTE — Telephone Encounter (Signed)
Spoke with Home health nurse yesterday evening about vomiting and changing meds

## 2013-05-29 ENCOUNTER — Other Ambulatory Visit: Payer: Self-pay | Admitting: Cardiology

## 2013-05-29 MED ORDER — ISOSORBIDE MONONITRATE ER 30 MG PO TB24: 30.0000 mg | ORAL_TABLET | Freq: Every day | ORAL | Status: AC

## 2013-05-30 ENCOUNTER — Other Ambulatory Visit: Payer: Self-pay | Admitting: Nurse Practitioner

## 2013-05-30 ENCOUNTER — Telehealth: Payer: Self-pay | Admitting: Nurse Practitioner

## 2013-05-30 DIAGNOSIS — G8929 Other chronic pain: Secondary | ICD-10-CM

## 2013-05-30 DIAGNOSIS — M549 Dorsalgia, unspecified: Principal | ICD-10-CM

## 2013-05-30 DIAGNOSIS — J449 Chronic obstructive pulmonary disease, unspecified: Secondary | ICD-10-CM

## 2013-05-30 MED ORDER — ALBUTEROL SULFATE (2.5 MG/3ML) 0.083% IN NEBU: 2.5000 mg | INHALATION_SOLUTION | Freq: Four times a day (QID) | RESPIRATORY_TRACT | Status: AC | PRN

## 2013-05-30 MED ORDER — OXYCODONE-ACETAMINOPHEN 10-325 MG PO TABS: 1.0000 | ORAL_TABLET | Freq: Three times a day (TID) | ORAL | Status: AC | PRN

## 2013-05-30 MED ORDER — ALPRAZOLAM 0.5 MG PO TABS: ORAL_TABLET | ORAL | Status: AC

## 2013-05-30 NOTE — Telephone Encounter (Signed)
rx ready for pickup 

## 2013-05-31 ENCOUNTER — Other Ambulatory Visit: Payer: Self-pay | Admitting: *Deleted

## 2013-05-31 MED ORDER — TIOTROPIUM BROMIDE MONOHYDRATE 18 MCG IN CAPS: 18.0000 ug | ORAL_CAPSULE | Freq: Every day | RESPIRATORY_TRACT | Status: AC

## 2013-06-07 ENCOUNTER — Ambulatory Visit: Payer: Medicare Other | Admitting: Nurse Practitioner

## 2013-06-07 ENCOUNTER — Telehealth: Payer: Self-pay | Admitting: Nurse Practitioner

## 2013-06-07 NOTE — Telephone Encounter (Signed)
ok 

## 2013-06-08 ENCOUNTER — Encounter: Payer: Self-pay | Admitting: Cardiology

## 2013-07-18 ENCOUNTER — Telehealth: Payer: Self-pay | Admitting: Family Medicine

## 2013-07-18 DIAGNOSIS — I739 Peripheral vascular disease, unspecified: Secondary | ICD-10-CM

## 2013-07-18 DIAGNOSIS — I5030 Unspecified diastolic (congestive) heart failure: Secondary | ICD-10-CM

## 2013-07-18 DIAGNOSIS — J189 Pneumonia, unspecified organism: Secondary | ICD-10-CM

## 2013-07-18 DIAGNOSIS — I4891 Unspecified atrial fibrillation: Secondary | ICD-10-CM

## 2013-07-18 DIAGNOSIS — J441 Chronic obstructive pulmonary disease with (acute) exacerbation: Secondary | ICD-10-CM

## 2013-07-18 DIAGNOSIS — I2510 Atherosclerotic heart disease of native coronary artery without angina pectoris: Secondary | ICD-10-CM

## 2014-04-20 DEATH — deceased

## 2014-06-28 IMAGING — CT CT CHEST W/O CM
2 of 3 series · 15 of 36 positions shown, 18 images · non-contrast
Comparison: Chest radiograph 11/23/2011 and chest and abdomen CT
06/13/2010

CLINICAL DATA: Left lung nodule identified on chest radiograph
11/23/2011.

CT CHEST WITHOUT CONTRAST
TECHNIQUE: Multidetector CT imaging of the chest was performed
following the standard protocol without IV contrast.

[Series 100: routine chest · axial · 0.78mm/px · z∈[-241,-6]mm · 12 of 57 slices shown, 15 images]
[im 5/57  mediastinal]
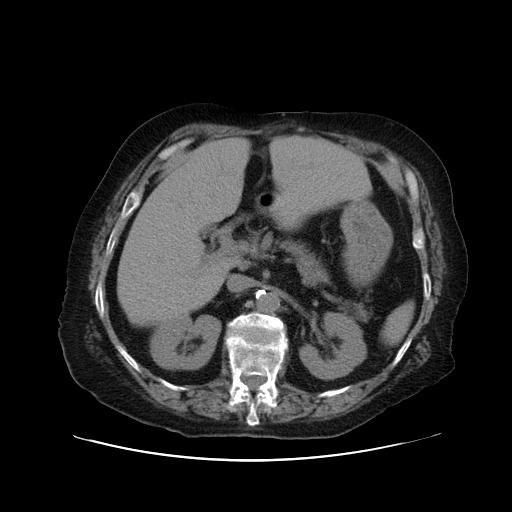
[im 5/57  lung]
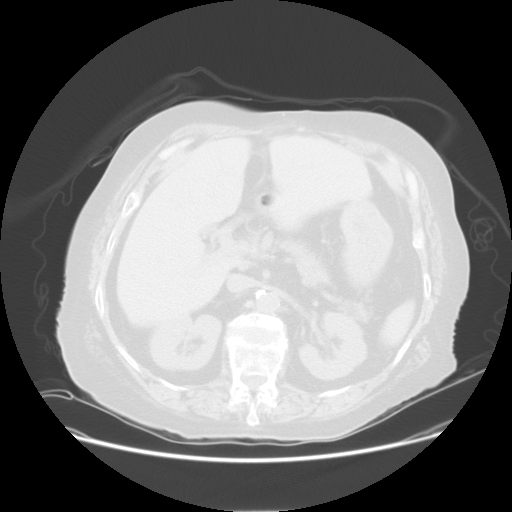
[im 9/57  lung]
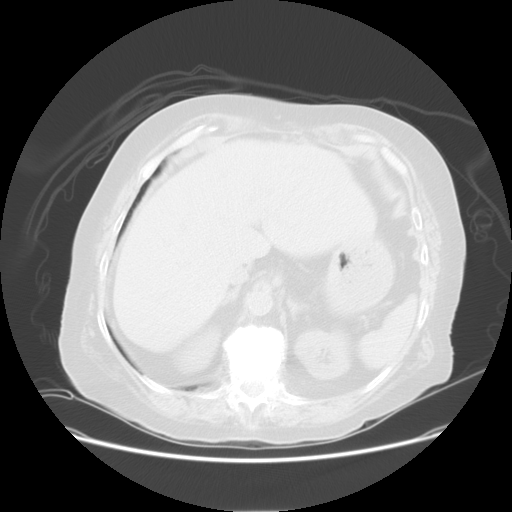
[im 13/57  lung]
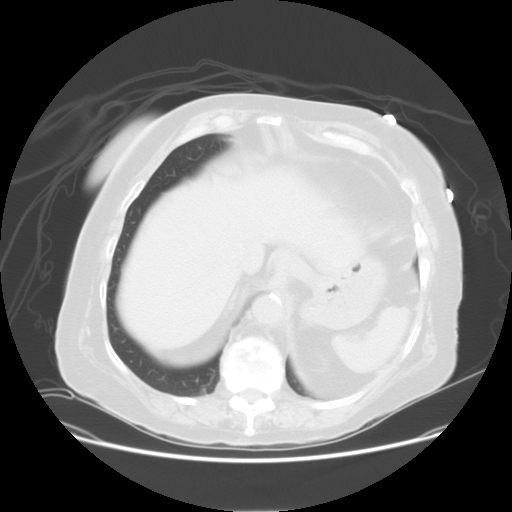
[im 17/57  lung]
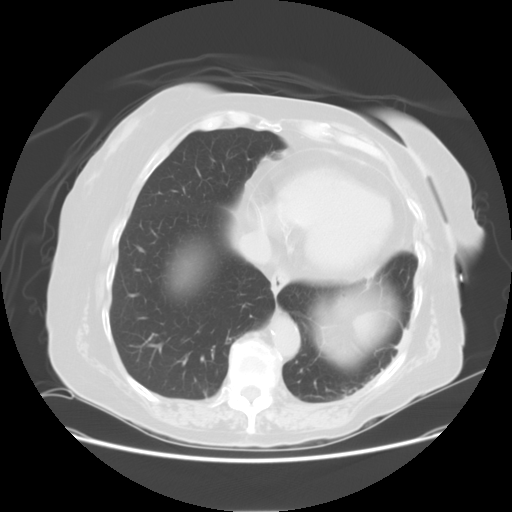
[im 21/57  mediastinal]
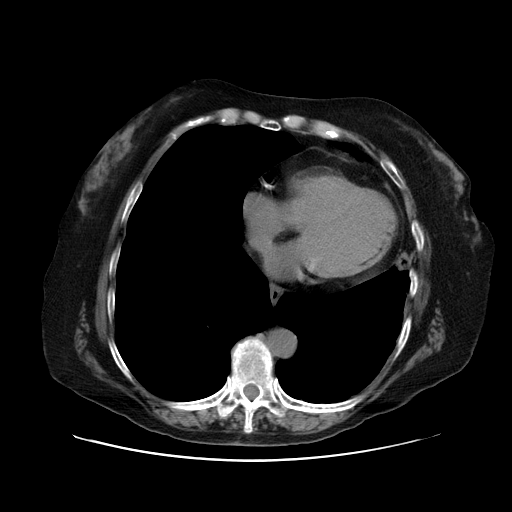
[im 21/57  lung]
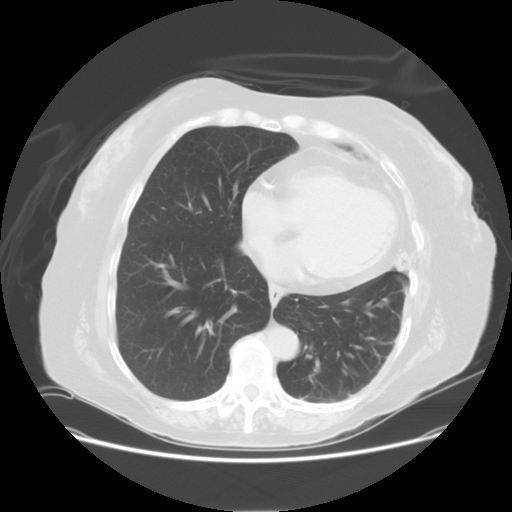
[im 25/57  lung]
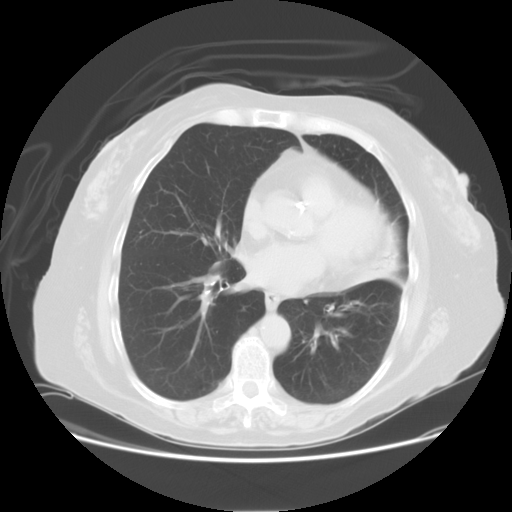
[im 32/57  lung]
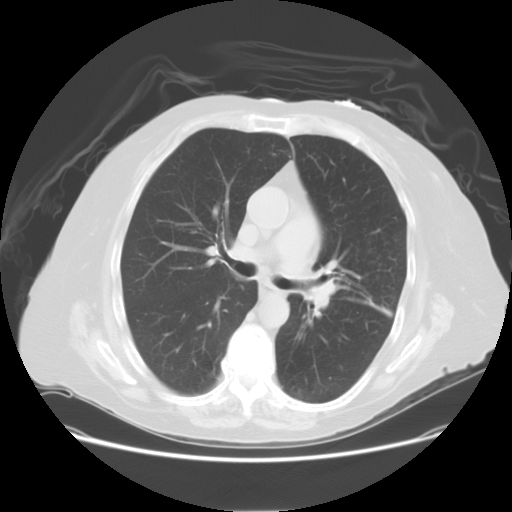
[im 36/57  lung]
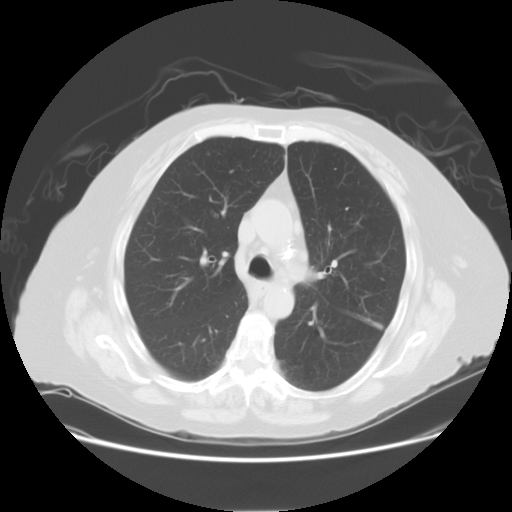
[im 40/57  mediastinal]
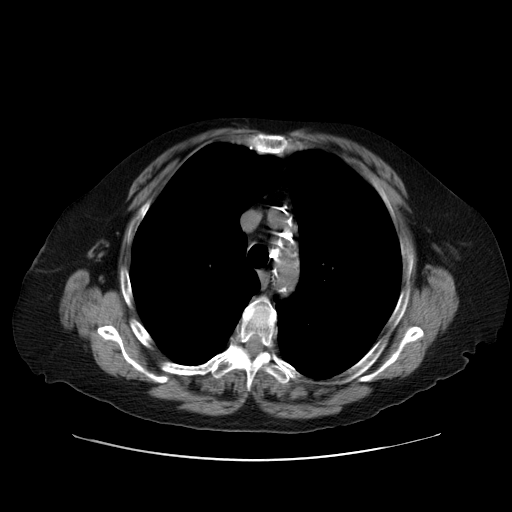
[im 40/57  lung]
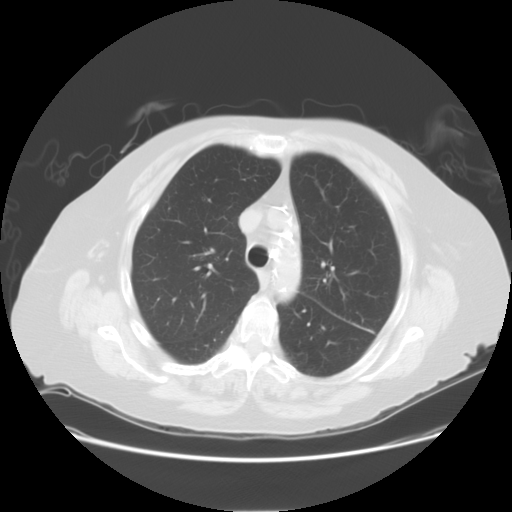
[im 44/57  lung]
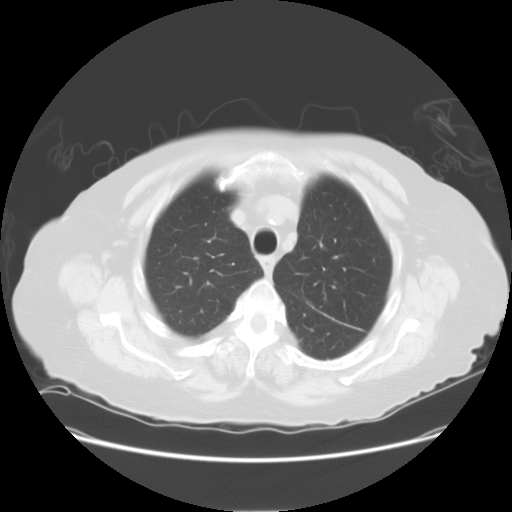
[im 48/57  lung]
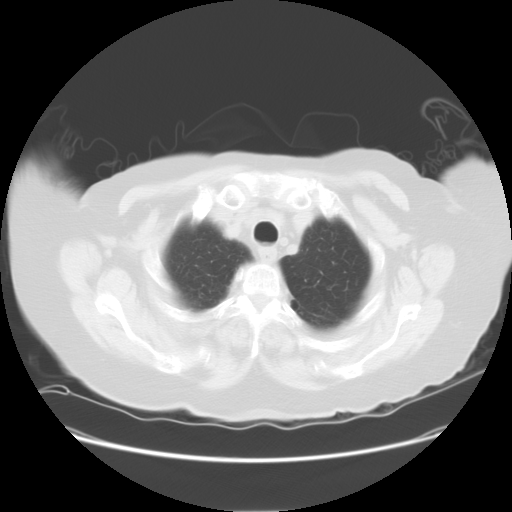
[im 52/57  lung]
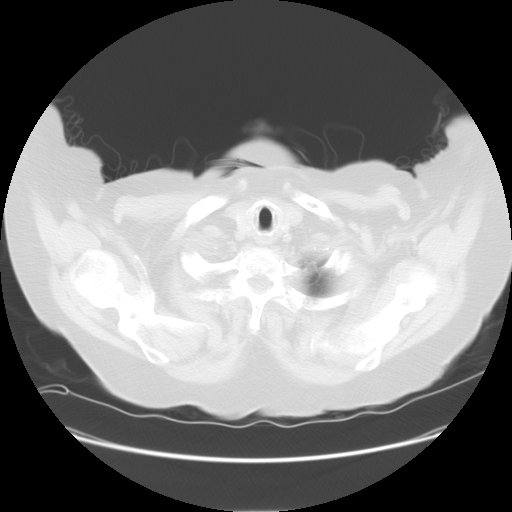

[Series 103: coronal · coronal · 0.78mm/px · 3 of 94 slices shown]
[im 19/94  lung]
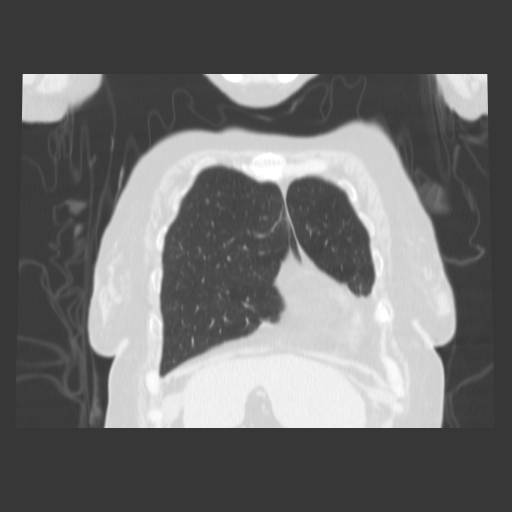
[im 38/94  lung]
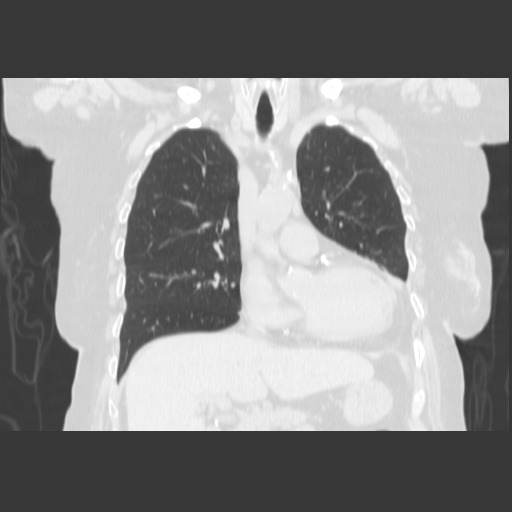
[im 56/94  lung]
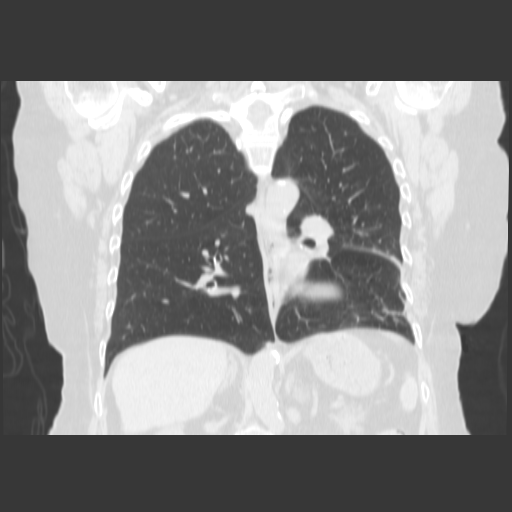

[15 of 36 positions shown; findings below may reference images not displayed]

FINDINGS: There is scattered atherosclerotic calcification within
the normal caliber thoracic aorta.  Coronary artery atherosclerotic
calcification is present.  Scattered normal sized mediastinal lymph
nodes.  No mediastinal, hilar, supraclavicular, or axillary
lymphadenopathy.

Chronic scarring/atelectasis in the lingula is stable compared to
prior chest CT of 06/13/2010.  There is stable mild linear scarring
or atelectasis in the posterior left lower lobe near the
costophrenic angle.  No discrete pulmonary nodule is seen in either
lung.  There is no airspace disease.  No significant change
compared to prior chest CT.

Images of the upper abdomen demonstrate normal bilateral adrenal
glands.  Normal noncontrast appearance of the spleen and visualized
portion of the liver (liver is not entirely imaged).
Cholecystectomy clips are present.  The imaged portions of the
pancreas and kidneys are within normal limits.

There are stable vertebroplasty changes at T7, T11, and T12.  A
stable severe compression fracture of L1 is noted.  The posterior
cortex of the L1 does buckle posteriorly, extending approximately 2-
3 mm into the ventral CSF space, stable.  No new fracture is
identified.
IMPRESSION: 1.  Negative for pulmonary nodule or mass.
2.  Chronic scarring in the lingula and chronic scar and/or
atelectasis in the left lower lobe.  It is possible that one of the
areas of scarring could have simulated a nodule on the recent chest
radiograph.
3.  Stable multilevel vertebroplasty changes, and chronic L1
vertebral body compression fracture.

## 2015-01-07 IMAGING — CR DG RIBS W/ CHEST 3+V*L*
4 series · 4 of 4 positions shown · non-contrast
Comparison: 04/01/2012

CLINICAL DATA: Abdominal pain.  Emesis.  Left chest pain.

LEFT RIBS AND CHEST - 3+ VIEW

[view not recorded (1 of 4)]
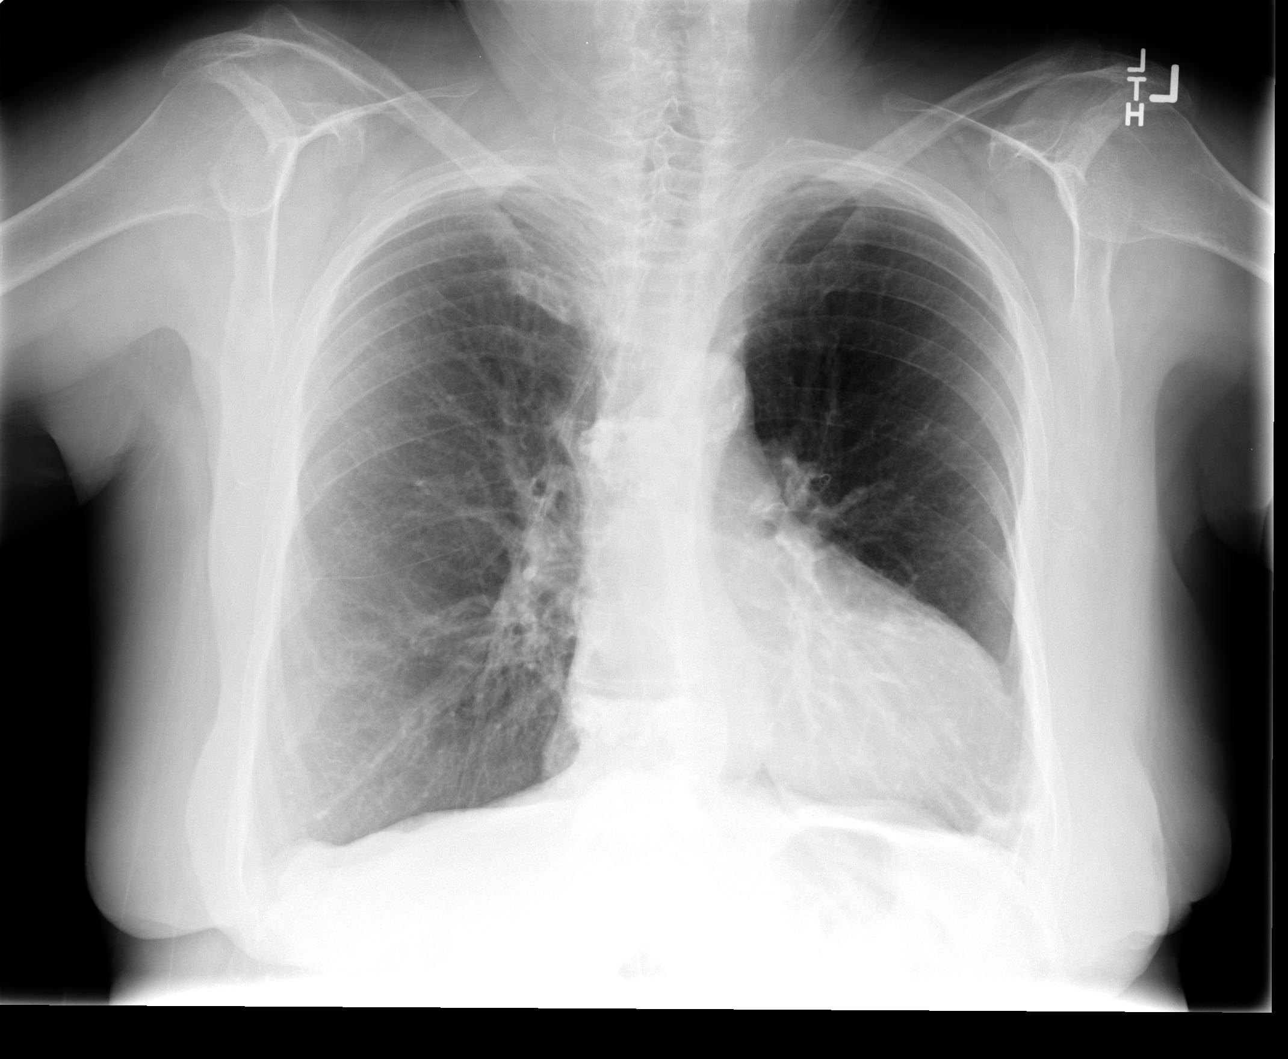

[view not recorded (2 of 4)]
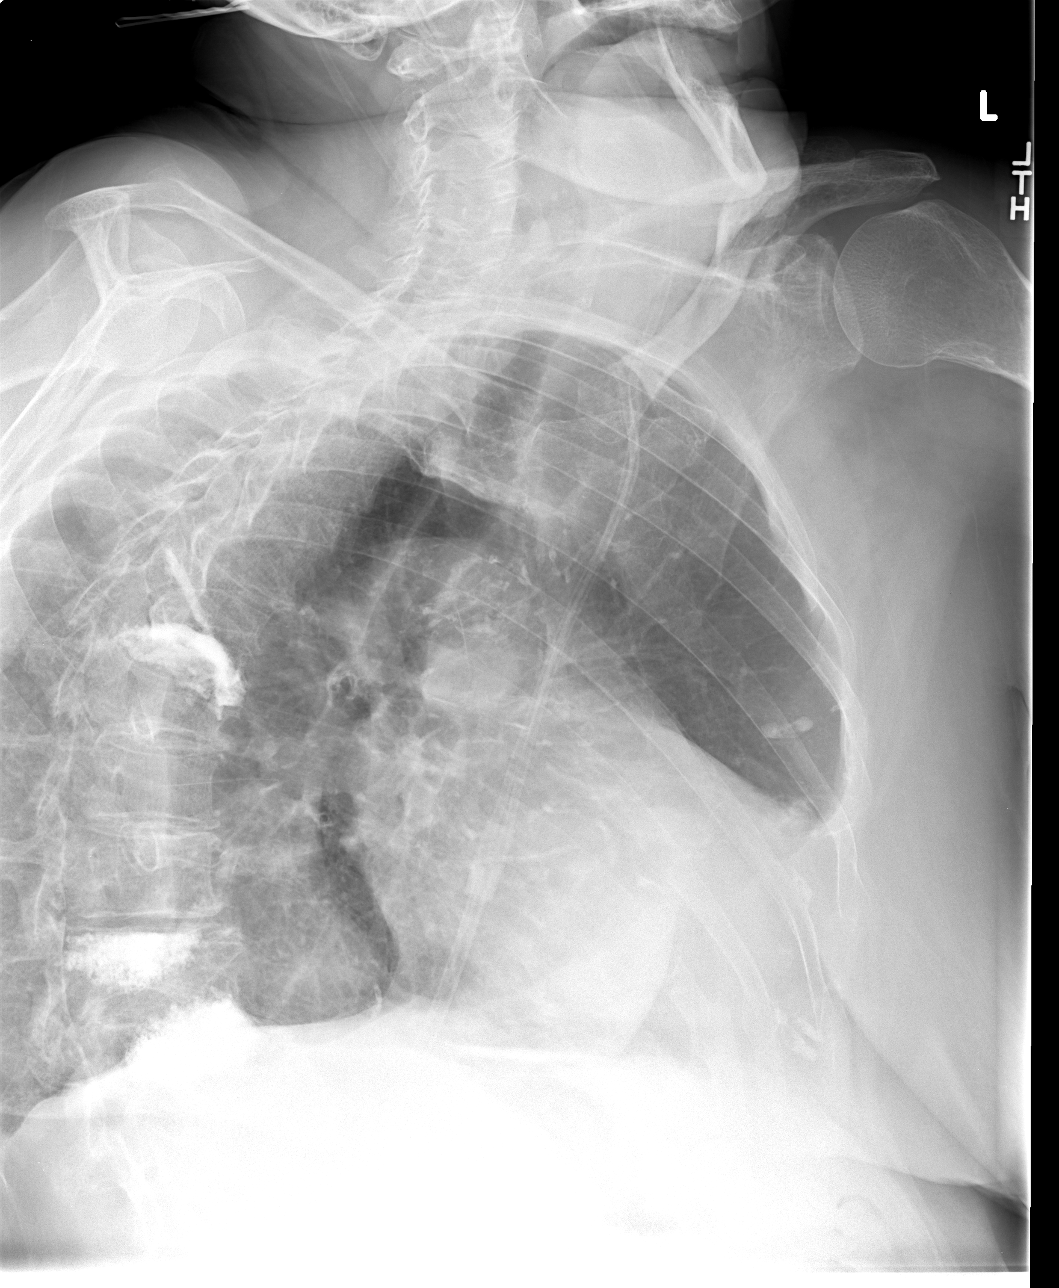

[view not recorded (3 of 4)]
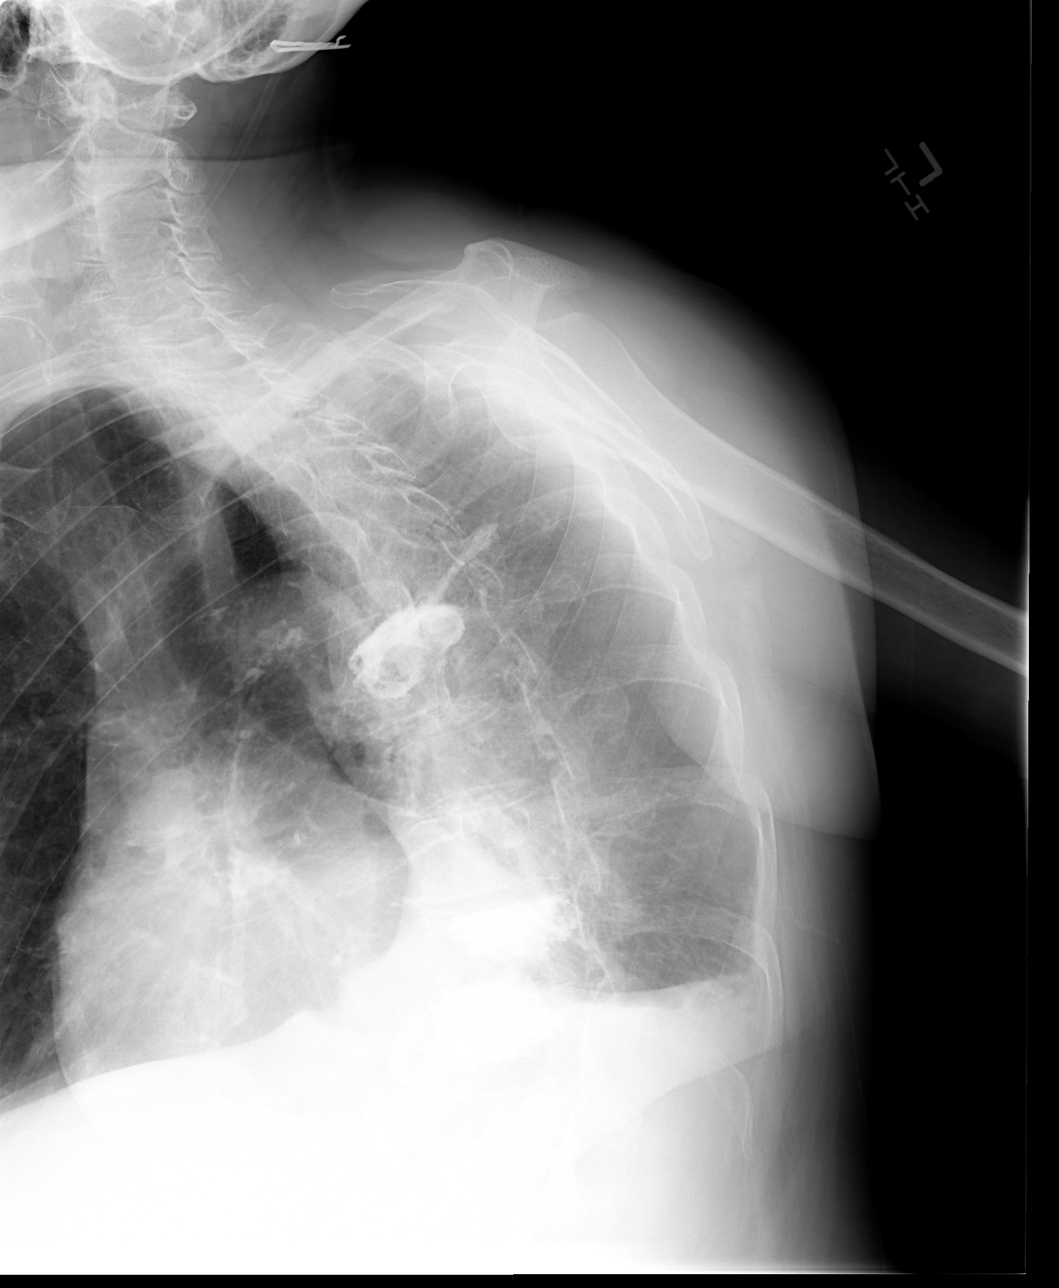

[view not recorded (4 of 4)]
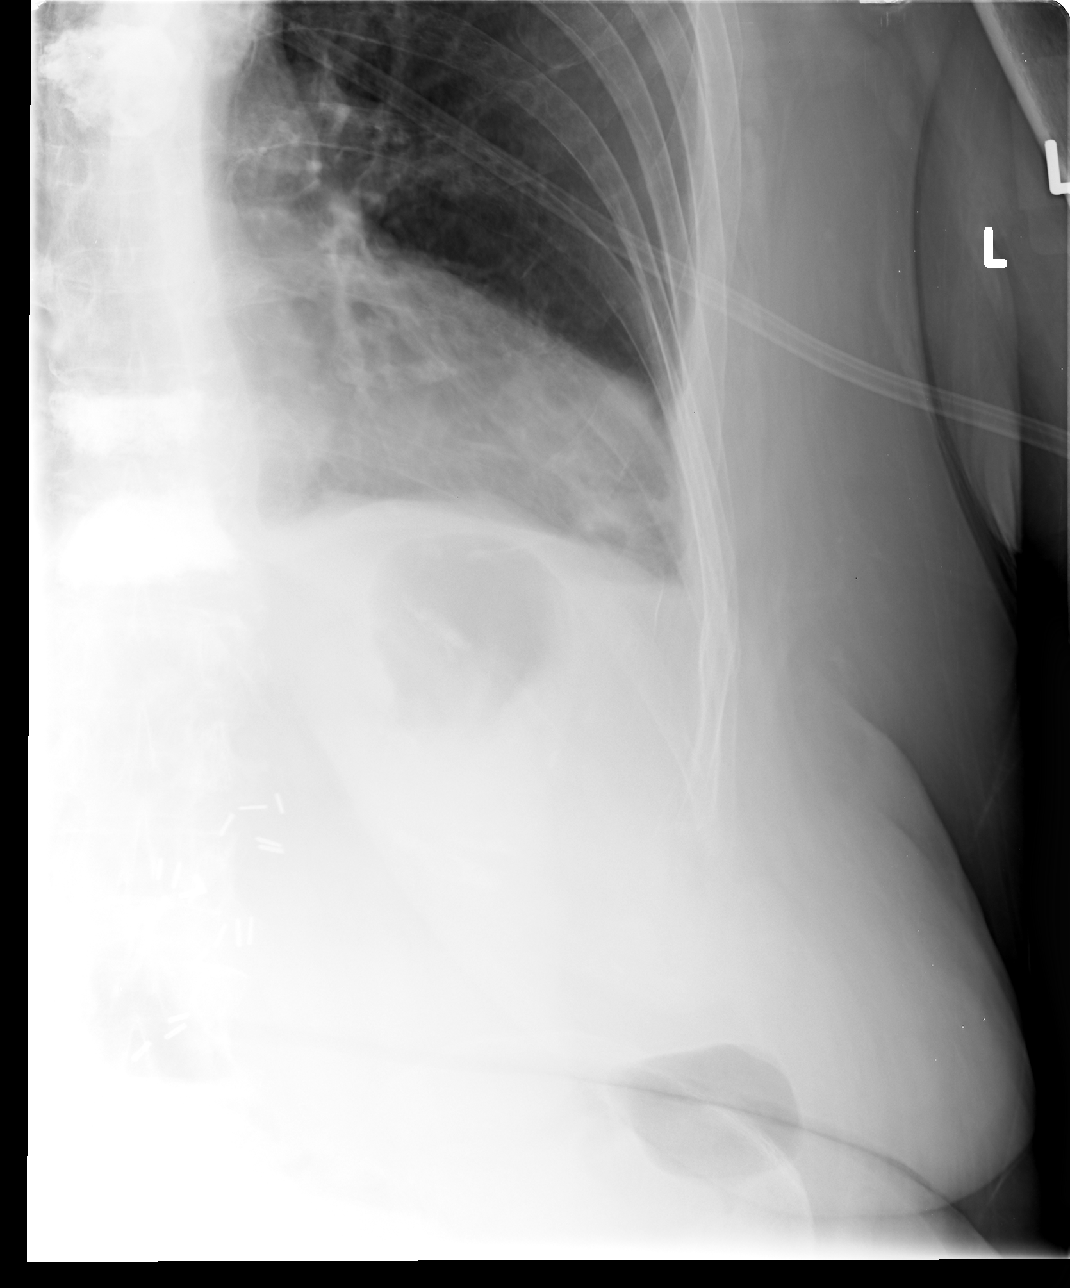

[4 of 4 positions shown; findings below may reference images not displayed]

FINDINGS: Hyperaeration.  Mild cardiomegaly.  Normal vascularity.
Stable left basilar atelectasis or scar.  Old left rib fractures
with callus formation are noted.  No definite acute rib fractures.
The patient is status post vertebroplasty at multiple thoracic
levels. There is a cortical step off involving a mid right rib
laterally.  Age of this fracture is indeterminate.
IMPRESSION: Old left-sided rib fractures.

No definite acute left rib fracture.

Right rib fracture of indeterminate age.

Hyperaeration is likely related to COPD.

Left basilar scar.
